# Patient Record
Sex: Female | Born: 1965
Health system: Southern US, Community
[De-identification: ages and names within clinical notes are randomized; demographics above are authoritative.]

## PROBLEM LIST (undated history)

## (undated) DIAGNOSIS — K2970 Gastritis, unspecified, without bleeding: Secondary | ICD-10-CM

## (undated) DIAGNOSIS — I10 Essential (primary) hypertension: Secondary | ICD-10-CM

## (undated) DIAGNOSIS — K439 Ventral hernia without obstruction or gangrene: Secondary | ICD-10-CM

## (undated) DIAGNOSIS — K3184 Gastroparesis: Secondary | ICD-10-CM

## (undated) DIAGNOSIS — G43909 Migraine, unspecified, not intractable, without status migrainosus: Secondary | ICD-10-CM

## (undated) DIAGNOSIS — E119 Type 2 diabetes mellitus without complications: Secondary | ICD-10-CM

## (undated) HISTORY — PX: CHOLECYSTECTOMY: SHX55

## (undated) HISTORY — DX: Type 2 diabetes mellitus without complications: E11.9

## (undated) HISTORY — PX: ABDOMINAL HYSTERECTOMY: SHX81

## (undated) HISTORY — PX: HERNIA REPAIR: SHX51

## (undated) HISTORY — DX: Essential (primary) hypertension: I10

## (undated) HISTORY — PX: APPLICATION OF WOUND VAC: SHX5189

## (undated) HISTORY — PX: APPENDECTOMY: SHX54

## (undated) HISTORY — DX: Ventral hernia without obstruction or gangrene: K43.9

## (undated) HISTORY — DX: Migraine, unspecified, not intractable, without status migrainosus: G43.909

## (undated) HISTORY — PX: KNEE SURGERY: SHX244

---

## 2004-05-05 ENCOUNTER — Emergency Department: Payer: Self-pay | Admitting: Emergency Medicine

## 2004-05-27 ENCOUNTER — Ambulatory Visit: Payer: Self-pay | Admitting: Pain Medicine

## 2004-07-02 ENCOUNTER — Ambulatory Visit: Payer: Self-pay | Admitting: Pain Medicine

## 2004-07-15 ENCOUNTER — Ambulatory Visit: Payer: Self-pay | Admitting: Pain Medicine

## 2004-07-20 ENCOUNTER — Emergency Department (HOSPITAL_COMMUNITY): Admission: EM | Admit: 2004-07-20 | Discharge: 2004-07-20 | Payer: Self-pay | Admitting: Emergency Medicine

## 2004-08-20 ENCOUNTER — Emergency Department: Payer: Self-pay | Admitting: Emergency Medicine

## 2004-08-24 ENCOUNTER — Ambulatory Visit: Payer: Self-pay | Admitting: Pain Medicine

## 2004-11-03 ENCOUNTER — Ambulatory Visit: Payer: Self-pay | Admitting: Pain Medicine

## 2004-11-11 ENCOUNTER — Ambulatory Visit: Payer: Self-pay | Admitting: Pain Medicine

## 2004-12-14 ENCOUNTER — Emergency Department (HOSPITAL_COMMUNITY): Admission: EM | Admit: 2004-12-14 | Discharge: 2004-12-14 | Payer: Self-pay | Admitting: Emergency Medicine

## 2004-12-25 ENCOUNTER — Ambulatory Visit (HOSPITAL_COMMUNITY): Admission: RE | Admit: 2004-12-25 | Discharge: 2004-12-25 | Payer: Self-pay | Admitting: Internal Medicine

## 2004-12-25 ENCOUNTER — Encounter: Payer: Self-pay | Admitting: Cardiovascular Disease

## 2004-12-25 ENCOUNTER — Ambulatory Visit: Payer: Self-pay | Admitting: Cardiovascular Disease

## 2005-01-21 ENCOUNTER — Inpatient Hospital Stay (HOSPITAL_COMMUNITY): Admission: EM | Admit: 2005-01-21 | Discharge: 2005-01-24 | Payer: Self-pay | Admitting: Emergency Medicine

## 2005-01-31 ENCOUNTER — Emergency Department: Payer: Self-pay | Admitting: Internal Medicine

## 2005-02-11 ENCOUNTER — Ambulatory Visit (HOSPITAL_COMMUNITY): Admission: RE | Admit: 2005-02-11 | Discharge: 2005-02-11 | Payer: Self-pay | Admitting: Orthopedic Surgery

## 2005-02-17 ENCOUNTER — Encounter: Admission: RE | Admit: 2005-02-17 | Discharge: 2005-02-17 | Payer: Self-pay | Admitting: Orthopedic Surgery

## 2005-02-25 ENCOUNTER — Emergency Department: Payer: Self-pay | Admitting: Emergency Medicine

## 2005-10-16 ENCOUNTER — Encounter: Admission: RE | Admit: 2005-10-16 | Discharge: 2005-10-16 | Payer: Self-pay | Admitting: Family Medicine

## 2006-02-28 ENCOUNTER — Ambulatory Visit (HOSPITAL_BASED_OUTPATIENT_CLINIC_OR_DEPARTMENT_OTHER): Admission: RE | Admit: 2006-02-28 | Discharge: 2006-02-28 | Payer: Self-pay | Admitting: Orthopedic Surgery

## 2006-07-04 ENCOUNTER — Emergency Department: Payer: Self-pay | Admitting: Emergency Medicine

## 2006-07-05 ENCOUNTER — Other Ambulatory Visit: Payer: Self-pay

## 2006-07-06 ENCOUNTER — Emergency Department: Payer: Self-pay | Admitting: Internal Medicine

## 2006-07-07 ENCOUNTER — Other Ambulatory Visit: Payer: Self-pay

## 2006-07-07 ENCOUNTER — Emergency Department: Payer: Self-pay | Admitting: Emergency Medicine

## 2006-09-22 ENCOUNTER — Emergency Department: Payer: Self-pay | Admitting: General Practice

## 2006-12-29 ENCOUNTER — Encounter: Admission: RE | Admit: 2006-12-29 | Discharge: 2006-12-29 | Payer: Self-pay | Admitting: Family Medicine

## 2007-03-01 ENCOUNTER — Inpatient Hospital Stay: Payer: Self-pay | Admitting: Internal Medicine

## 2007-03-01 ENCOUNTER — Other Ambulatory Visit: Payer: Self-pay

## 2007-04-18 ENCOUNTER — Ambulatory Visit: Payer: Self-pay | Admitting: Family Medicine

## 2008-02-15 ENCOUNTER — Ambulatory Visit: Payer: Self-pay | Admitting: Internal Medicine

## 2008-02-29 ENCOUNTER — Ambulatory Visit: Payer: Self-pay | Admitting: Unknown Physician Specialty

## 2008-04-05 ENCOUNTER — Emergency Department: Payer: Self-pay | Admitting: Emergency Medicine

## 2008-06-03 ENCOUNTER — Emergency Department: Payer: Self-pay | Admitting: Emergency Medicine

## 2008-08-02 ENCOUNTER — Emergency Department: Payer: Self-pay

## 2008-08-24 ENCOUNTER — Emergency Department: Payer: Self-pay | Admitting: Emergency Medicine

## 2008-10-23 ENCOUNTER — Emergency Department: Payer: Self-pay | Admitting: Internal Medicine

## 2008-11-05 ENCOUNTER — Ambulatory Visit: Payer: Self-pay | Admitting: Pain Medicine

## 2008-11-20 ENCOUNTER — Ambulatory Visit: Payer: Self-pay | Admitting: Pain Medicine

## 2008-11-25 ENCOUNTER — Ambulatory Visit: Payer: Self-pay | Admitting: Pain Medicine

## 2008-11-27 ENCOUNTER — Ambulatory Visit: Payer: Self-pay | Admitting: Pain Medicine

## 2008-12-04 ENCOUNTER — Ambulatory Visit: Payer: Self-pay | Admitting: Pain Medicine

## 2008-12-11 ENCOUNTER — Ambulatory Visit: Payer: Self-pay | Admitting: Pain Medicine

## 2008-12-26 ENCOUNTER — Ambulatory Visit: Payer: Self-pay | Admitting: Pain Medicine

## 2008-12-30 ENCOUNTER — Ambulatory Visit: Payer: Self-pay | Admitting: Pain Medicine

## 2009-02-06 ENCOUNTER — Ambulatory Visit: Payer: Self-pay | Admitting: Pain Medicine

## 2009-02-12 ENCOUNTER — Ambulatory Visit: Payer: Self-pay | Admitting: Pain Medicine

## 2009-03-06 ENCOUNTER — Ambulatory Visit: Payer: Self-pay | Admitting: Pain Medicine

## 2009-03-10 ENCOUNTER — Ambulatory Visit: Payer: Self-pay | Admitting: Pain Medicine

## 2009-03-20 ENCOUNTER — Ambulatory Visit: Payer: Self-pay | Admitting: Pain Medicine

## 2009-05-01 ENCOUNTER — Ambulatory Visit: Payer: Self-pay | Admitting: Pain Medicine

## 2009-05-05 ENCOUNTER — Ambulatory Visit: Payer: Self-pay | Admitting: Pain Medicine

## 2009-05-12 ENCOUNTER — Ambulatory Visit: Payer: Self-pay | Admitting: Pain Medicine

## 2009-06-03 ENCOUNTER — Ambulatory Visit: Payer: Self-pay | Admitting: Pain Medicine

## 2009-07-01 ENCOUNTER — Ambulatory Visit: Payer: Self-pay | Admitting: Pain Medicine

## 2009-07-23 ENCOUNTER — Emergency Department: Payer: Self-pay | Admitting: Emergency Medicine

## 2009-07-23 ENCOUNTER — Ambulatory Visit: Payer: Self-pay | Admitting: Pain Medicine

## 2009-08-28 ENCOUNTER — Ambulatory Visit: Payer: Self-pay | Admitting: Pain Medicine

## 2009-09-08 ENCOUNTER — Ambulatory Visit: Payer: Self-pay | Admitting: Pain Medicine

## 2009-09-25 ENCOUNTER — Ambulatory Visit: Payer: Self-pay | Admitting: Pain Medicine

## 2009-10-23 ENCOUNTER — Ambulatory Visit: Payer: Self-pay | Admitting: Pain Medicine

## 2009-10-27 ENCOUNTER — Ambulatory Visit: Payer: Self-pay | Admitting: Pain Medicine

## 2009-11-04 ENCOUNTER — Ambulatory Visit: Payer: Self-pay | Admitting: Pain Medicine

## 2009-11-24 ENCOUNTER — Ambulatory Visit: Payer: Self-pay | Admitting: Pain Medicine

## 2009-12-01 ENCOUNTER — Emergency Department: Payer: Self-pay | Admitting: Emergency Medicine

## 2009-12-16 ENCOUNTER — Ambulatory Visit: Payer: Self-pay | Admitting: Internal Medicine

## 2009-12-25 ENCOUNTER — Ambulatory Visit: Payer: Self-pay | Admitting: Pain Medicine

## 2010-01-21 ENCOUNTER — Ambulatory Visit: Payer: Self-pay | Admitting: Pain Medicine

## 2010-01-28 ENCOUNTER — Ambulatory Visit: Payer: Self-pay | Admitting: Pain Medicine

## 2010-02-17 ENCOUNTER — Ambulatory Visit: Payer: Self-pay | Admitting: Pain Medicine

## 2010-02-19 ENCOUNTER — Inpatient Hospital Stay: Payer: Self-pay | Admitting: Internal Medicine

## 2010-03-11 ENCOUNTER — Ambulatory Visit: Payer: Self-pay | Admitting: Pain Medicine

## 2010-03-21 ENCOUNTER — Ambulatory Visit: Payer: Self-pay | Admitting: Pain Medicine

## 2010-04-06 ENCOUNTER — Ambulatory Visit (HOSPITAL_BASED_OUTPATIENT_CLINIC_OR_DEPARTMENT_OTHER): Admission: RE | Admit: 2010-04-06 | Discharge: 2010-04-06 | Payer: Self-pay | Admitting: Orthopedic Surgery

## 2010-04-21 ENCOUNTER — Ambulatory Visit: Payer: Self-pay | Admitting: Pain Medicine

## 2010-05-08 ENCOUNTER — Emergency Department: Payer: Self-pay | Admitting: Unknown Physician Specialty

## 2010-05-21 ENCOUNTER — Ambulatory Visit: Payer: Self-pay | Admitting: Pain Medicine

## 2010-06-18 ENCOUNTER — Ambulatory Visit: Payer: Self-pay | Admitting: Pain Medicine

## 2010-06-21 ENCOUNTER — Emergency Department: Payer: Self-pay | Admitting: Emergency Medicine

## 2010-07-05 ENCOUNTER — Encounter: Payer: Self-pay | Admitting: Specialist

## 2010-07-20 ENCOUNTER — Ambulatory Visit: Payer: Self-pay | Admitting: Pain Medicine

## 2010-07-29 ENCOUNTER — Ambulatory Visit: Payer: Self-pay | Admitting: Pain Medicine

## 2010-08-18 ENCOUNTER — Ambulatory Visit: Payer: Self-pay | Admitting: Pain Medicine

## 2010-08-26 LAB — GLUCOSE, CAPILLARY: Glucose-Capillary: 91 mg/dL (ref 70–99)

## 2010-08-26 LAB — POCT I-STAT, CHEM 8
Chloride: 99 mEq/L (ref 96–112)
Glucose, Bld: 102 mg/dL — ABNORMAL HIGH (ref 70–99)
HCT: 47 % — ABNORMAL HIGH (ref 36.0–46.0)
Hemoglobin: 16 g/dL — ABNORMAL HIGH (ref 12.0–15.0)
Potassium: 3.4 mEq/L — ABNORMAL LOW (ref 3.5–5.1)

## 2010-09-01 ENCOUNTER — Ambulatory Visit: Payer: Self-pay | Admitting: Pain Medicine

## 2010-09-17 ENCOUNTER — Ambulatory Visit: Payer: Self-pay | Admitting: Pain Medicine

## 2010-09-30 ENCOUNTER — Ambulatory Visit: Payer: Self-pay | Admitting: Pain Medicine

## 2010-10-12 ENCOUNTER — Ambulatory Visit: Payer: Self-pay | Admitting: Internal Medicine

## 2010-10-20 ENCOUNTER — Ambulatory Visit: Payer: Self-pay | Admitting: Pain Medicine

## 2010-10-28 ENCOUNTER — Ambulatory Visit: Payer: Self-pay | Admitting: Pain Medicine
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
## 2010-10-30 NOTE — Op Note (Signed)
Daisy [name], Daisy [name]               ACCOUNT NO.:  [id number]   MEDICAL RECORD NO.:  [id number]          PATIENT TYPE:  AMB   LOCATION:  DAY                          FACILITY:  [facility]   PHYSICIAN:  Marlowe Kays, M.D.  DATE OF BIRTH:  May 20, 1966   DATE OF PROCEDURE:  02/11/2005  DATE OF DISCHARGE:                                 OPERATIVE REPORT   PREOPERATIVE DIAGNOSIS:  Torn lateral meniscus left knee.   POSTOPERATIVE DIAGNOSES:  1.  Torn lateral meniscus.  2.  Grade 2/4 chondromalacia lateral femoral condyle left knee.   OPERATION:  Left knee arthroscopy with partial lateral meniscectomy and  shaving of lateral femoral condyle.   SURGEON:  Dr. Simonne Come   ASSISTANT:  Nurse.   ANESTHESIA:  General.   PATHOLOGY AND JUSTIFICATION OF PROCEDURE:  Because of pain and swelling in  her left knee, I obtained an MRI on November 20, 2004, which showed a badly torn  lateral meniscus with some mild thinning of the lateral compartment.  Otherwise, the knee appeared to be unremarkable, and this is what I found at  surgery.   PROCEDURE:  Satisfactory general anesthesia, pneumatic tourniquet, leg was  esmarched out nonsterilely.  Thigh stabilizer and prepped from thigh  stabilizer to ankle with DuraPrep and draped in a sterile field.  Ace wrap  and knee support for right knee.  Superomedial saline inflow, first through  an anterior medial portal lateral compartment, knee joint was evaluated.  The significant tear of the lateral meniscus was noted and removed with a  combination of instruments including arthroscopic scissors, baskets, and the  3.5 shaver until the remaining meniscus was stable on probing.  She also had  grade 2/4 chondromalacia of the lateral femoral condyle which I shaved down  until smooth.  Looking up in the lateral gutter and suprapatellar area, I  was unable to see the patella through this portal.  She is morbidly obese.  I then reversed portals.  Medially, I removed some  synovium with the 3.5  shaver.  The medial meniscus appeared to be normal with no wear in the  joint.  I looked up in the medial gutter and suprapatellar area.  Patella  surface was smooth and did not require any surgical treatment.  The knee  joint was then irrigated until clear and all fluid possible removed.  The  two anterior portals were closed with 4-0 nylon; 20 mL 0.5% Marcaine with  adrenaline, 4 mg of morphine were then instilled through the inflow  apparatus which was removed and this portal closed with 4-0 nylon as well.  Betadine, Adaptic, dry sterile dressing were applied.  Tourniquet was  released.  She tolerated the procedure well and was taken to the recovery  room in satisfactory condition with no known complications.          ______________________________  Marlowe Kays, M.D.    JA/MEDQ  D:  02/11/2005  T:  02/11/2005  Job:  [id number]

## 2010-10-30 NOTE — Discharge Summary (Signed)
NAMEREGGIE, WELGE               ACCOUNT NO.:  [id number]   MEDICAL RECORD NO.:  [id number]          PATIENT TYPE:  INP   LOCATION:  5006                         FACILITY:  [facility]   PHYSICIAN:  Fleet Contras, M.D.    DATE OF BIRTH:  1966/05/11   DATE OF ADMISSION:  01/21/2005  DATE OF DISCHARGE:  01/24/2005                                 DISCHARGE SUMMARY   HISTORY OF PRESENT ILLNESS:  Mrs. Eanes is a 45 year old African American  lady with past medical history significant for peptic ulcer disease,  hypertension, depression, and arthritis of the knees.  The patient presented  to the emergency room with complaints of one day of nausea, vomiting, and  epigastric pain.  She denied any use of alcohol or nonsteroidal anti-  inflammatory drugs.  There was no blood in her vomitus.  Could not be sure  as she was in mild painful distress.  Vital signs showed blood pressure  195/100, temperature 99.1. Abdomen was soft.  She was tender in the  epigastrium without guarding or rebound.  Bowel sounds were present.  Urinalysis was negative.  White count was 9.9, hemoglobin 15.2, platelet  count 4.7.  Sodium 138, potassium 3, BUN 4, creatinine 0.8, glucose 119.  Lipase was 26.  She was, therefore, admitted to a medical bed with diagnosis  of acute gastritis, uncontrolled hypertension, and hypopotassemia.   HOSPITAL COURSE:  The patient was started on intravenous fluids, kept n.p.o.  then advanced to clear liquids.  She was on IV analgesia and IV Protonix.  Potassium was repleted both intravenously and orally.  Her symptoms improved  on this regimen.  Her blood pressure came down without any need of  medications.  On January 24, 2005, she seemed much better, was able to  tolerate a full diet.  Vital signs were stable with a blood pressure 100/60.  Abdomen was benign.  Bowel sounds were present.  Her potassium had improved  to normal.   DISCHARGE DIAGNOSES:  1.  Gastritis, resolved.  2.   Hypertension, well controlled.  3.  Hypokalemia, resolved.   CONDITION ON DISCHARGE:  Stable.   DISPOSITION:  Home.   DISCHARGE MEDICATIONS:  1.  Nexium 40 mg once a day.  2.  Reglan 10 mg one p.o. q.6h. p.r.n.  3.  Xanax 0.25 mg t.i.d. p.r.n.  4.  Advicor 500/20 one p.o. daily.  5.  Allegra 180 mg daily.  6.  Lotensin 10 mg daily.  7.  Prozac 20 mg b.i.d.  8.  Elavil 150 mg q.h.s.  9.  Topamax 200 mg at night.  10. She was advised to stop using hydrochlorothiazide and to use Lasix      p.r.n. for leg swelling.   FOLLOWUP:  Follow up with Dr. Concepcion Elk in one week.      Fleet Contras, M.D.  Electronically Signed     EA/MEDQ  D:  04/15/2005  T:  04/15/2005  Job:  [id number]

## 2010-11-02 ENCOUNTER — Emergency Department: Payer: Self-pay | Admitting: Emergency Medicine

## 2010-11-19 ENCOUNTER — Ambulatory Visit: Payer: Self-pay | Admitting: Pain Medicine

## 2010-12-22 ENCOUNTER — Ambulatory Visit: Payer: Self-pay | Admitting: Pain Medicine

## 2011-01-17 ENCOUNTER — Emergency Department: Payer: Self-pay | Admitting: Emergency Medicine

## 2011-01-19 ENCOUNTER — Ambulatory Visit: Payer: Self-pay | Admitting: Pain Medicine

## 2011-02-18 ENCOUNTER — Ambulatory Visit: Payer: Self-pay | Admitting: Pain Medicine

## 2011-03-01 ENCOUNTER — Ambulatory Visit: Payer: Self-pay | Admitting: Pain Medicine

## 2011-03-18 ENCOUNTER — Ambulatory Visit: Payer: Self-pay | Admitting: Pain Medicine

## 2011-04-15 ENCOUNTER — Ambulatory Visit: Payer: Self-pay | Admitting: Pain Medicine

## 2011-04-28 ENCOUNTER — Ambulatory Visit: Payer: Self-pay | Admitting: Pain Medicine

## 2011-05-13 ENCOUNTER — Ambulatory Visit: Payer: Self-pay | Admitting: Pain Medicine

## 2011-05-31 ENCOUNTER — Ambulatory Visit: Payer: Self-pay | Admitting: Pain Medicine

## 2011-07-08 ENCOUNTER — Ambulatory Visit: Payer: Self-pay | Admitting: Pain Medicine

## 2011-08-10 ENCOUNTER — Ambulatory Visit: Payer: Self-pay | Admitting: Pain Medicine

## 2011-09-09 ENCOUNTER — Ambulatory Visit: Payer: Self-pay | Admitting: Pain Medicine

## 2011-09-24 ENCOUNTER — Emergency Department: Payer: Self-pay | Admitting: *Deleted

## 2011-09-24 LAB — CBC
HGB: 13.2 g/dL (ref 12.0–16.0)
MCH: 29.8 pg (ref 26.0–34.0)
RBC: 4.41 10*6/uL (ref 3.80–5.20)
WBC: 6.3 10*3/uL (ref 3.6–11.0)

## 2011-09-24 LAB — URINALYSIS, COMPLETE
Bacteria: NONE SEEN
Blood: NEGATIVE
Glucose,UR: NEGATIVE mg/dL (ref 0–75)
Ketone: NEGATIVE
Leukocyte Esterase: NEGATIVE
Protein: NEGATIVE
Specific Gravity: 1.019 (ref 1.003–1.030)
Squamous Epithelial: NONE SEEN

## 2011-09-24 LAB — COMPREHENSIVE METABOLIC PANEL
Albumin: 3.5 g/dL (ref 3.4–5.0)
Anion Gap: 9 (ref 7–16)
Bilirubin,Total: 0.5 mg/dL (ref 0.2–1.0)
Calcium, Total: 7.9 mg/dL — ABNORMAL LOW (ref 8.5–10.1)
Chloride: 104 mmol/L (ref 98–107)
Creatinine: 0.83 mg/dL (ref 0.60–1.30)
EGFR (African American): >60
Glucose: 93 mg/dL (ref 65–99)
Potassium: 3.7 mmol/L (ref 3.5–5.1)

## 2011-09-24 LAB — CK TOTAL AND CKMB (NOT AT ARMC)
CK, Total: 87 U/L (ref 21–215)
CK-MB: <0.5 ng/mL — ABNORMAL LOW (ref 0.5–3.6)

## 2011-09-24 LAB — TROPONIN I: Troponin-I: <0.02 ng/mL

## 2011-09-24 LAB — LIPASE, BLOOD: Lipase: 72 U/L — ABNORMAL LOW (ref 73–393)

## 2011-09-28 LAB — COMPREHENSIVE METABOLIC PANEL
Anion Gap: 11 (ref 7–16)
BUN: 6 mg/dL — ABNORMAL LOW (ref 7–18)
Bilirubin,Total: 0.2 mg/dL (ref 0.2–1.0)
Co2: 22 mmol/L (ref 21–32)
Creatinine: 0.85 mg/dL (ref 0.60–1.30)
EGFR (Non-African Amer.): >60
Glucose: 102 mg/dL — ABNORMAL HIGH (ref 65–99)
Osmolality: 273 (ref 275–301)
Potassium: 4.2 mmol/L (ref 3.5–5.1)
SGPT (ALT): 58 U/L
Sodium: 138 mmol/L (ref 136–145)

## 2011-09-28 LAB — LIPASE, BLOOD: Lipase: 79 U/L (ref 73–393)

## 2011-09-28 LAB — CBC
HGB: 13.7 g/dL (ref 12.0–16.0)
MCV: 93 fL (ref 80–100)
RBC: 4.49 10*6/uL (ref 3.80–5.20)
RDW: 14.1 % (ref 11.5–14.5)
WBC: 7 10*3/uL (ref 3.6–11.0)

## 2011-09-28 LAB — URINALYSIS, COMPLETE
Blood: NEGATIVE
Glucose,UR: NEGATIVE mg/dL (ref 0–75)
Nitrite: NEGATIVE
Ph: 9 (ref 4.5–8.0)
Protein: NEGATIVE
RBC,UR: NONE SEEN /HPF (ref 0–5)
Specific Gravity: 1.014 (ref 1.003–1.030)
Squamous Epithelial: NONE SEEN

## 2011-09-29 ENCOUNTER — Observation Stay: Payer: Self-pay | Admitting: Surgery

## 2011-09-29 LAB — CBC WITH DIFFERENTIAL/PLATELET
HCT: 38.9 % (ref 35.0–47.0)
HGB: 12.6 g/dL (ref 12.0–16.0)
Lymphocyte #: 3.7 10*3/uL — ABNORMAL HIGH (ref 1.0–3.6)
MCH: 30.1 pg (ref 26.0–34.0)
MCHC: 32.5 g/dL (ref 32.0–36.0)
MCV: 93 fL (ref 80–100)
Neutrophil %: 43.7 %
Platelet: 301 10*3/uL (ref 150–440)
RBC: 4.2 10*6/uL (ref 3.80–5.20)
RDW: 13.8 % (ref 11.5–14.5)
WBC: 7.8 10*3/uL (ref 3.6–11.0)

## 2011-09-29 LAB — BASIC METABOLIC PANEL
Anion Gap: 9 (ref 7–16)
Chloride: 105 mmol/L (ref 98–107)
EGFR (African American): >60
EGFR (Non-African Amer.): >60
Glucose: 91 mg/dL (ref 65–99)
Osmolality: 278 (ref 275–301)
Sodium: 141 mmol/L (ref 136–145)

## 2011-09-29 LAB — LIPID PANEL: Ldl Cholesterol, Calc: 162 mg/dL — ABNORMAL HIGH (ref 0–100)

## 2011-09-29 LAB — HEMOGLOBIN A1C: Hemoglobin A1C: 6.2 % (ref 4.2–6.3)

## 2011-09-30 LAB — CBC WITH DIFFERENTIAL/PLATELET
HCT: 37.6 % (ref 35.0–47.0)
HGB: 12.2 g/dL (ref 12.0–16.0)
Lymphocyte #: 4 10*3/uL — ABNORMAL HIGH (ref 1.0–3.6)
Lymphocyte %: 54.6 %
MCV: 92 fL (ref 80–100)
Monocyte #: 0.5 x10 3/mm (ref 0.2–0.9)
Neutrophil #: 2.6 10*3/uL (ref 1.4–6.5)
Neutrophil %: 36.4 %
RBC: 4.09 10*6/uL (ref 3.80–5.20)
RDW: 13.7 % (ref 11.5–14.5)

## 2011-10-12 ENCOUNTER — Ambulatory Visit: Payer: Self-pay | Admitting: Pain Medicine

## 2011-11-09 ENCOUNTER — Ambulatory Visit: Payer: Self-pay | Admitting: Pain Medicine

## 2011-11-15 ENCOUNTER — Ambulatory Visit: Payer: Self-pay | Admitting: Pain Medicine

## 2011-12-09 ENCOUNTER — Ambulatory Visit: Payer: Self-pay | Admitting: Pain Medicine

## 2012-01-06 ENCOUNTER — Ambulatory Visit: Payer: Self-pay | Admitting: Pain Medicine

## 2012-02-08 ENCOUNTER — Ambulatory Visit: Payer: Self-pay | Admitting: Pain Medicine

## 2012-02-21 ENCOUNTER — Ambulatory Visit: Payer: Self-pay | Admitting: Pain Medicine

## 2012-03-07 ENCOUNTER — Ambulatory Visit: Payer: Self-pay | Admitting: Pain Medicine

## 2012-04-06 ENCOUNTER — Ambulatory Visit: Payer: Self-pay | Admitting: Pain Medicine

## 2012-04-17 ENCOUNTER — Ambulatory Visit: Payer: Self-pay | Admitting: Pain Medicine

## 2012-04-20 ENCOUNTER — Emergency Department: Payer: Self-pay | Admitting: Emergency Medicine

## 2012-04-20 LAB — CBC
HCT: 44.5 % (ref 35.0–47.0)
MCH: 29.5 pg (ref 26.0–34.0)
MCV: 91 fL (ref 80–100)
Platelet: 383 10*3/uL (ref 150–440)
RBC: 4.92 10*6/uL (ref 3.80–5.20)
WBC: 11.3 10*3/uL — ABNORMAL HIGH (ref 3.6–11.0)

## 2012-04-20 LAB — COMPREHENSIVE METABOLIC PANEL
Albumin: 4.1 g/dL (ref 3.4–5.0)
Alkaline Phosphatase: 70 U/L (ref 50–136)
BUN: 11 mg/dL (ref 7–18)
EGFR (Non-African Amer.): >60
Glucose: 102 mg/dL — ABNORMAL HIGH (ref 65–99)
Osmolality: 273 (ref 275–301)
Potassium: 3.3 mmol/L — ABNORMAL LOW (ref 3.5–5.1)
SGOT(AST): 16 U/L (ref 15–37)
Sodium: 137 mmol/L (ref 136–145)

## 2012-05-09 ENCOUNTER — Ambulatory Visit: Payer: Self-pay | Admitting: Pain Medicine

## 2012-05-18 ENCOUNTER — Emergency Department: Payer: Self-pay | Admitting: Emergency Medicine

## 2012-05-18 LAB — CBC WITH DIFFERENTIAL/PLATELET
Eosinophil %: 0.2 %
Lymphocyte %: 15.6 %
Monocyte %: 2.6 %
Neutrophil %: 80.8 %
Platelet: 318 10*3/uL (ref 150–440)
RBC: 4.49 10*6/uL (ref 3.80–5.20)
WBC: 8.9 10*3/uL (ref 3.6–11.0)

## 2012-05-18 LAB — COMPREHENSIVE METABOLIC PANEL
Alkaline Phosphatase: 77 U/L (ref 50–136)
Anion Gap: 9 (ref 7–16)
Calcium, Total: 8.9 mg/dL (ref 8.5–10.1)
Co2: 19 mmol/L — ABNORMAL LOW (ref 21–32)
EGFR (African American): >60
Glucose: 123 mg/dL — ABNORMAL HIGH (ref 65–99)
Osmolality: 278 (ref 275–301)
SGOT(AST): 16 U/L (ref 15–37)
Sodium: 140 mmol/L (ref 136–145)

## 2012-05-18 LAB — URINALYSIS, COMPLETE
Bacteria: NONE SEEN
Bilirubin,UR: NEGATIVE
Glucose,UR: NEGATIVE mg/dL (ref 0–75)
Leukocyte Esterase: NEGATIVE
Ph: 8 (ref 4.5–8.0)
RBC,UR: 2 /HPF (ref 0–5)
Squamous Epithelial: 5

## 2012-05-18 LAB — LIPASE, BLOOD: Lipase: 59 U/L — ABNORMAL LOW (ref 73–393)

## 2012-05-19 ENCOUNTER — Ambulatory Visit: Payer: Self-pay | Admitting: Pain Medicine

## 2012-05-19 ENCOUNTER — Emergency Department: Payer: Self-pay | Admitting: Emergency Medicine

## 2012-06-05 ENCOUNTER — Ambulatory Visit: Payer: Self-pay | Admitting: Pain Medicine

## 2012-06-13 ENCOUNTER — Inpatient Hospital Stay: Payer: Self-pay

## 2012-06-13 LAB — SEDIMENTATION RATE: Erythrocyte Sed Rate: 19 mm/hr (ref 0–20)

## 2012-06-13 LAB — URINALYSIS, COMPLETE
Glucose,UR: NEGATIVE mg/dL (ref 0–75)
Protein: NEGATIVE
RBC,UR: 2 /HPF (ref 0–5)
Squamous Epithelial: 2
WBC UR: 1 /HPF (ref 0–5)

## 2012-06-13 LAB — COMPREHENSIVE METABOLIC PANEL
Albumin: 4.1 g/dL (ref 3.4–5.0)
Alkaline Phosphatase: 76 U/L (ref 50–136)
BUN: 7 mg/dL (ref 7–18)
Chloride: 104 mmol/L (ref 98–107)
EGFR (African American): >60
Glucose: 119 mg/dL — ABNORMAL HIGH (ref 65–99)
SGOT(AST): 15 U/L (ref 15–37)
SGPT (ALT): 19 U/L (ref 12–78)
Total Protein: 8.9 g/dL — ABNORMAL HIGH (ref 6.4–8.2)

## 2012-06-13 LAB — CBC
HGB: 14.1 g/dL (ref 12.0–16.0)
MCH: 30.4 pg (ref 26.0–34.0)
MCHC: 33.2 g/dL (ref 32.0–36.0)
MCV: 92 fL (ref 80–100)

## 2012-06-13 LAB — PRO B NATRIURETIC PEPTIDE: B-Type Natriuretic Peptide: 4802 pg/mL — ABNORMAL HIGH (ref 0–125)

## 2012-06-13 LAB — LIPASE, BLOOD: Lipase: 74 U/L (ref 73–393)

## 2012-06-14 LAB — BASIC METABOLIC PANEL
BUN: 5 mg/dL — ABNORMAL LOW (ref 7–18)
Calcium, Total: 8.5 mg/dL (ref 8.5–10.1)
Chloride: 102 mmol/L (ref 98–107)
Creatinine: 0.88 mg/dL (ref 0.60–1.30)
EGFR (Non-African Amer.): >60
Osmolality: 277 (ref 275–301)
Sodium: 140 mmol/L (ref 136–145)

## 2012-06-14 LAB — CBC WITH DIFFERENTIAL/PLATELET
Basophil #: 0.1 10*3/uL (ref 0.0–0.1)
Basophil %: 0.6 %
Eosinophil %: 0 %
HGB: 12.1 g/dL (ref 12.0–16.0)
Lymphocyte #: 2.7 10*3/uL (ref 1.0–3.6)
MCH: 29.6 pg (ref 26.0–34.0)
MCHC: 32.9 g/dL (ref 32.0–36.0)
Monocyte #: 0.8 x10 3/mm (ref 0.2–0.9)
Neutrophil %: 70.1 %
Platelet: 294 10*3/uL (ref 150–440)
RBC: 4.08 10*6/uL (ref 3.80–5.20)
RDW: 14 % (ref 11.5–14.5)
WBC: 11.7 10*3/uL — ABNORMAL HIGH (ref 3.6–11.0)

## 2012-06-14 LAB — TROPONIN I
Troponin-I: 0.33 ng/mL — ABNORMAL HIGH
Troponin-I: 0.38 ng/mL — ABNORMAL HIGH
Troponin-I: 0.6 ng/mL — ABNORMAL HIGH
Troponin-I: <0.02 ng/mL

## 2012-06-14 LAB — CK TOTAL AND CKMB (NOT AT ARMC)
CK, Total: 100 U/L (ref 21–215)
CK-MB: <0.5 ng/mL — ABNORMAL LOW (ref 0.5–3.6)
CK-MB: 1.6 ng/mL (ref 0.5–3.6)

## 2012-06-15 LAB — CBC WITH DIFFERENTIAL/PLATELET
Basophil #: 0 10*3/uL (ref 0.0–0.1)
Basophil %: 0.3 %
Eosinophil #: 0.1 10*3/uL (ref 0.0–0.7)
Eosinophil %: 0.9 %
HCT: 39.7 % (ref 35.0–47.0)
Lymphocyte #: 3.3 10*3/uL (ref 1.0–3.6)
Lymphocyte %: 41 %
MCH: 29.7 pg (ref 26.0–34.0)
MCHC: 32.7 g/dL (ref 32.0–36.0)
Monocyte %: 8.2 %
Neutrophil #: 4 10*3/uL (ref 1.4–6.5)
RBC: 4.37 10*6/uL (ref 3.80–5.20)
RDW: 14.4 % (ref 11.5–14.5)
WBC: 8.1 10*3/uL (ref 3.6–11.0)

## 2012-06-15 LAB — BASIC METABOLIC PANEL
Anion Gap: 10 (ref 7–16)
Calcium, Total: 8.7 mg/dL (ref 8.5–10.1)
Chloride: 98 mmol/L (ref 98–107)
Creatinine: 1.07 mg/dL (ref 0.60–1.30)
Glucose: 88 mg/dL (ref 65–99)
Osmolality: 274 (ref 275–301)

## 2012-06-16 LAB — BASIC METABOLIC PANEL
Anion Gap: 7 (ref 7–16)
BUN: 14 mg/dL (ref 7–18)
Chloride: 99 mmol/L (ref 98–107)
Co2: 29 mmol/L (ref 21–32)
Creatinine: 1.29 mg/dL (ref 0.60–1.30)
Glucose: 128 mg/dL — ABNORMAL HIGH (ref 65–99)
Osmolality: 272 (ref 275–301)
Potassium: 3.4 mmol/L — ABNORMAL LOW (ref 3.5–5.1)

## 2012-06-17 LAB — BASIC METABOLIC PANEL
Anion Gap: 7 (ref 7–16)
BUN: 13 mg/dL (ref 7–18)
Calcium, Total: 8.3 mg/dL — ABNORMAL LOW (ref 8.5–10.1)
Co2: 28 mmol/L (ref 21–32)
Creatinine: 0.94 mg/dL (ref 0.60–1.30)
EGFR (African American): >60
EGFR (Non-African Amer.): >60
Glucose: 105 mg/dL — ABNORMAL HIGH (ref 65–99)
Potassium: 4.1 mmol/L (ref 3.5–5.1)
Sodium: 140 mmol/L (ref 136–145)

## 2012-07-04 ENCOUNTER — Ambulatory Visit: Payer: Self-pay | Admitting: Pain Medicine

## 2012-07-13 ENCOUNTER — Emergency Department: Payer: Self-pay | Admitting: Emergency Medicine

## 2012-07-13 LAB — COMPREHENSIVE METABOLIC PANEL
Albumin: 4.3 g/dL (ref 3.4–5.0)
Alkaline Phosphatase: 69 U/L (ref 50–136)
Anion Gap: 10 (ref 7–16)
BUN: 7 mg/dL (ref 7–18)
Calcium, Total: 9.3 mg/dL (ref 8.5–10.1)
Chloride: 109 mmol/L — ABNORMAL HIGH (ref 98–107)
Co2: 21 mmol/L (ref 21–32)
Creatinine: 0.87 mg/dL (ref 0.60–1.30)
EGFR (Non-African Amer.): >60
Glucose: 141 mg/dL — ABNORMAL HIGH (ref 65–99)
SGOT(AST): 16 U/L (ref 15–37)
SGPT (ALT): 19 U/L (ref 12–78)
Total Protein: 9.1 g/dL — ABNORMAL HIGH (ref 6.4–8.2)

## 2012-07-13 LAB — CBC
HGB: 14.9 g/dL (ref 12.0–16.0)
MCV: 91 fL (ref 80–100)
RBC: 4.99 10*6/uL (ref 3.80–5.20)
RDW: 14.6 % — ABNORMAL HIGH (ref 11.5–14.5)
WBC: 9.7 10*3/uL (ref 3.6–11.0)

## 2012-07-13 LAB — TROPONIN I: Troponin-I: <0.02 ng/mL

## 2012-07-15 LAB — URINALYSIS, COMPLETE
Bacteria: NONE SEEN
Blood: NEGATIVE
Glucose,UR: NEGATIVE mg/dL (ref 0–75)
Ketone: NEGATIVE
Ph: 8 (ref 4.5–8.0)
Protein: NEGATIVE
Specific Gravity: 1.008 (ref 1.003–1.030)
Squamous Epithelial: 2
WBC UR: NONE SEEN /HPF (ref 0–5)

## 2012-07-15 LAB — CBC
HCT: 44.2 % (ref 35.0–47.0)
HGB: 14.5 g/dL (ref 12.0–16.0)
MCH: 29.8 pg (ref 26.0–34.0)
MCHC: 32.7 g/dL (ref 32.0–36.0)
MCV: 91 fL (ref 80–100)
Platelet: 333 10*3/uL (ref 150–440)

## 2012-07-15 LAB — COMPREHENSIVE METABOLIC PANEL
Albumin: 4.3 g/dL (ref 3.4–5.0)
Alkaline Phosphatase: 64 U/L (ref 50–136)
Anion Gap: 9 (ref 7–16)
Calcium, Total: 9 mg/dL (ref 8.5–10.1)
Chloride: 106 mmol/L (ref 98–107)
EGFR (African American): >60
EGFR (Non-African Amer.): >60
Osmolality: 272 (ref 275–301)
Total Protein: 8.8 g/dL — ABNORMAL HIGH (ref 6.4–8.2)

## 2012-07-15 LAB — TROPONIN I: Troponin-I: <0.02 ng/mL

## 2012-07-15 LAB — LIPASE, BLOOD: Lipase: 133 U/L (ref 73–393)

## 2012-07-16 ENCOUNTER — Inpatient Hospital Stay: Payer: Self-pay

## 2012-07-16 LAB — CBC WITH DIFFERENTIAL/PLATELET
Basophil %: 1.1 %
Eosinophil %: 1 %
HCT: 40 % (ref 35.0–47.0)
Lymphocyte #: 4.9 10*3/uL — ABNORMAL HIGH (ref 1.0–3.6)
Lymphocyte %: 53.6 %
MCH: 29.9 pg (ref 26.0–34.0)
MCV: 91 fL (ref 80–100)
Monocyte #: 0.6 x10 3/mm (ref 0.2–0.9)
Monocyte %: 6.4 %
Platelet: 297 10*3/uL (ref 150–440)
RDW: 14.8 % — ABNORMAL HIGH (ref 11.5–14.5)

## 2012-07-16 LAB — BASIC METABOLIC PANEL
Anion Gap: 8 (ref 7–16)
BUN: 8 mg/dL (ref 7–18)
Chloride: 110 mmol/L — ABNORMAL HIGH (ref 98–107)
EGFR (African American): >60
Glucose: 83 mg/dL (ref 65–99)
Osmolality: 277 (ref 275–301)
Potassium: 3.5 mmol/L (ref 3.5–5.1)
Sodium: 140 mmol/L (ref 136–145)

## 2012-07-16 LAB — MAGNESIUM: Magnesium: 1.7 mg/dL — ABNORMAL LOW

## 2012-07-17 LAB — CLOSTRIDIUM DIFFICILE BY PCR

## 2012-08-01 ENCOUNTER — Ambulatory Visit: Payer: Self-pay | Admitting: Pain Medicine

## 2012-08-09 ENCOUNTER — Emergency Department: Payer: Self-pay | Admitting: Emergency Medicine

## 2012-08-09 LAB — COMPREHENSIVE METABOLIC PANEL
Albumin: 4.2 g/dL (ref 3.4–5.0)
Alkaline Phosphatase: 65 U/L (ref 50–136)
Anion Gap: 9 (ref 7–16)
Bilirubin,Total: 0.4 mg/dL (ref 0.2–1.0)
Calcium, Total: 9.2 mg/dL (ref 8.5–10.1)
Chloride: 103 mmol/L (ref 98–107)
Creatinine: 0.89 mg/dL (ref 0.60–1.30)
EGFR (Non-African Amer.): >60
Osmolality: 276 (ref 275–301)
Potassium: 3.1 mmol/L — ABNORMAL LOW (ref 3.5–5.1)
Sodium: 138 mmol/L (ref 136–145)
Total Protein: 9.2 g/dL — ABNORMAL HIGH (ref 6.4–8.2)

## 2012-08-09 LAB — URINALYSIS, COMPLETE
Bacteria: NONE SEEN
Bilirubin,UR: NEGATIVE
Glucose,UR: 50 mg/dL (ref 0–75)
Leukocyte Esterase: NEGATIVE
Nitrite: NEGATIVE
RBC,UR: 4 /HPF (ref 0–5)
Specific Gravity: 1.011 (ref 1.003–1.030)
Squamous Epithelial: 7
WBC UR: 1 /HPF (ref 0–5)

## 2012-08-09 LAB — CBC
MCH: 30.1 pg (ref 26.0–34.0)
MCHC: 33.3 g/dL (ref 32.0–36.0)
MCV: 90 fL (ref 80–100)
RBC: 4.89 10*6/uL (ref 3.80–5.20)
RDW: 14.8 % — ABNORMAL HIGH (ref 11.5–14.5)
WBC: 11 10*3/uL (ref 3.6–11.0)

## 2012-08-09 LAB — LIPASE, BLOOD: Lipase: 66 U/L — ABNORMAL LOW (ref 73–393)

## 2012-08-24 ENCOUNTER — Emergency Department: Payer: Self-pay | Admitting: Emergency Medicine

## 2012-08-31 ENCOUNTER — Ambulatory Visit: Payer: Self-pay | Admitting: Pain Medicine

## 2012-09-04 ENCOUNTER — Ambulatory Visit: Payer: Self-pay | Admitting: Pain Medicine

## 2012-09-17 ENCOUNTER — Emergency Department: Payer: Self-pay | Admitting: Emergency Medicine

## 2012-09-28 ENCOUNTER — Ambulatory Visit: Payer: Self-pay | Admitting: Pain Medicine

## 2012-10-15 ENCOUNTER — Emergency Department: Payer: Self-pay | Admitting: Emergency Medicine

## 2012-10-16 ENCOUNTER — Ambulatory Visit: Payer: Self-pay | Admitting: Pain Medicine

## 2012-10-25 ENCOUNTER — Ambulatory Visit: Payer: Self-pay | Admitting: Pain Medicine

## 2012-11-02 ENCOUNTER — Emergency Department: Payer: Self-pay | Admitting: Emergency Medicine

## 2012-11-02 LAB — CBC WITH DIFFERENTIAL/PLATELET
Basophil #: 0 10*3/uL (ref 0.0–0.1)
Eosinophil #: 0 10*3/uL (ref 0.0–0.7)
Eosinophil %: 0.2 %
HCT: 40.8 % (ref 35.0–47.0)
HGB: 13.8 g/dL (ref 12.0–16.0)
Lymphocyte #: 1.5 10*3/uL (ref 1.0–3.6)
MCH: 30 pg (ref 26.0–34.0)
Monocyte #: 0.4 x10 3/mm (ref 0.2–0.9)
Monocyte %: 7.1 %
Neutrophil #: 3.2 10*3/uL (ref 1.4–6.5)
Neutrophil %: 63.2 %
Platelet: 267 10*3/uL (ref 150–440)
RBC: 4.59 10*6/uL (ref 3.80–5.20)
RDW: 14.9 % — ABNORMAL HIGH (ref 11.5–14.5)
WBC: 5.1 10*3/uL (ref 3.6–11.0)

## 2012-11-02 LAB — URINALYSIS, COMPLETE
Bacteria: NONE SEEN
Bilirubin,UR: NEGATIVE
Glucose,UR: NEGATIVE mg/dL (ref 0–75)
Nitrite: NEGATIVE
Ph: 8 (ref 4.5–8.0)
Protein: NEGATIVE
Specific Gravity: 1.006 (ref 1.003–1.030)
Squamous Epithelial: 4
WBC UR: <1 /HPF (ref 0–5)

## 2012-11-02 LAB — COMPREHENSIVE METABOLIC PANEL
Albumin: 3.4 g/dL (ref 3.4–5.0)
Alkaline Phosphatase: 77 U/L (ref 50–136)
Anion Gap: 7 (ref 7–16)
Calcium, Total: 8.7 mg/dL (ref 8.5–10.1)
Chloride: 102 mmol/L (ref 98–107)
Co2: 26 mmol/L (ref 21–32)
Creatinine: 0.83 mg/dL (ref 0.60–1.30)
EGFR (African American): >60
Glucose: 104 mg/dL — ABNORMAL HIGH (ref 65–99)
Potassium: 3.9 mmol/L (ref 3.5–5.1)
SGOT(AST): 32 U/L (ref 15–37)
Total Protein: 7.5 g/dL (ref 6.4–8.2)

## 2012-11-02 LAB — PREGNANCY, URINE: Pregnancy Test, Urine: NEGATIVE m[IU]/mL

## 2012-11-02 LAB — LIPASE, BLOOD: Lipase: 46 U/L — ABNORMAL LOW (ref 73–393)

## 2012-11-07 LAB — CULTURE, BLOOD (SINGLE)

## 2012-11-14 ENCOUNTER — Emergency Department: Payer: Self-pay | Admitting: Emergency Medicine

## 2012-11-15 LAB — URINALYSIS, COMPLETE
Bacteria: NONE SEEN
Bilirubin,UR: NEGATIVE
Ketone: NEGATIVE
Leukocyte Esterase: NEGATIVE
Nitrite: NEGATIVE
Ph: 7 (ref 4.5–8.0)
RBC,UR: NONE SEEN /HPF (ref 0–5)
Specific Gravity: 1.011 (ref 1.003–1.030)
Squamous Epithelial: 7
WBC UR: 1 /HPF (ref 0–5)

## 2012-11-28 ENCOUNTER — Ambulatory Visit: Payer: Self-pay | Admitting: Pain Medicine

## 2012-12-25 ENCOUNTER — Emergency Department: Payer: Self-pay | Admitting: Emergency Medicine

## 2012-12-25 LAB — COMPREHENSIVE METABOLIC PANEL
Albumin: 3 g/dL — ABNORMAL LOW (ref 3.4–5.0)
Alkaline Phosphatase: 69 U/L (ref 50–136)
BUN: 9 mg/dL (ref 7–18)
Bilirubin,Total: 0.2 mg/dL (ref 0.2–1.0)
Calcium, Total: 8.2 mg/dL — ABNORMAL LOW (ref 8.5–10.1)
Chloride: 109 mmol/L — ABNORMAL HIGH (ref 98–107)
Creatinine: 0.75 mg/dL (ref 0.60–1.30)
EGFR (Non-African Amer.): >60
Glucose: 106 mg/dL — ABNORMAL HIGH (ref 65–99)
Osmolality: 280 (ref 275–301)
SGPT (ALT): 12 U/L (ref 12–78)

## 2012-12-25 LAB — CBC
HCT: 35.1 % (ref 35.0–47.0)
HGB: 11.7 g/dL — ABNORMAL LOW (ref 12.0–16.0)
MCH: 30.3 pg (ref 26.0–34.0)
RDW: 13.9 % (ref 11.5–14.5)
WBC: 9.2 10*3/uL (ref 3.6–11.0)

## 2012-12-25 LAB — LIPASE, BLOOD: Lipase: 77 U/L (ref 73–393)

## 2012-12-25 LAB — TROPONIN I: Troponin-I: <0.02 ng/mL

## 2012-12-26 LAB — URINALYSIS, COMPLETE
Glucose,UR: NEGATIVE mg/dL (ref 0–75)
Ketone: NEGATIVE
Leukocyte Esterase: NEGATIVE
Nitrite: NEGATIVE
Ph: 6 (ref 4.5–8.0)
RBC,UR: 2 /HPF (ref 0–5)
Squamous Epithelial: 17
WBC UR: 5 /HPF (ref 0–5)

## 2012-12-27 ENCOUNTER — Ambulatory Visit: Payer: Self-pay | Admitting: Gastroenterology

## 2012-12-28 ENCOUNTER — Ambulatory Visit: Payer: Self-pay | Admitting: Pain Medicine

## 2013-01-11 ENCOUNTER — Emergency Department: Payer: Self-pay | Admitting: Emergency Medicine

## 2013-01-17 ENCOUNTER — Ambulatory Visit: Payer: Self-pay | Admitting: Pain Medicine

## 2013-01-22 ENCOUNTER — Emergency Department: Payer: Self-pay | Admitting: Emergency Medicine

## 2013-02-01 ENCOUNTER — Ambulatory Visit: Payer: Self-pay | Admitting: Pain Medicine

## 2013-02-14 ENCOUNTER — Ambulatory Visit: Payer: Self-pay | Admitting: Pain Medicine

## 2013-02-15 ENCOUNTER — Emergency Department: Payer: Self-pay | Admitting: Emergency Medicine

## 2013-02-15 LAB — COMPREHENSIVE METABOLIC PANEL
Albumin: 3.8 g/dL (ref 3.4–5.0)
Alkaline Phosphatase: 65 U/L (ref 50–136)
Bilirubin,Total: 0.5 mg/dL (ref 0.2–1.0)
Chloride: 108 mmol/L — ABNORMAL HIGH (ref 98–107)
Co2: 21 mmol/L (ref 21–32)
EGFR (African American): >60
Glucose: 94 mg/dL (ref 65–99)
Osmolality: 272 (ref 275–301)
Potassium: 4.2 mmol/L (ref 3.5–5.1)
SGOT(AST): 18 U/L (ref 15–37)
SGPT (ALT): 18 U/L (ref 12–78)
Total Protein: 7.8 g/dL (ref 6.4–8.2)

## 2013-02-15 LAB — URINALYSIS, COMPLETE
Blood: NEGATIVE
Glucose,UR: NEGATIVE mg/dL (ref 0–75)
Leukocyte Esterase: NEGATIVE
Nitrite: NEGATIVE
Protein: NEGATIVE
Specific Gravity: 1.004 (ref 1.003–1.030)
Squamous Epithelial: 8
WBC UR: <1 /HPF (ref 0–5)

## 2013-02-15 LAB — LIPASE, BLOOD: Lipase: 90 U/L (ref 73–393)

## 2013-02-15 LAB — CBC
MCH: 30.8 pg (ref 26.0–34.0)
MCV: 90 fL (ref 80–100)
Platelet: 351 10*3/uL (ref 150–440)
WBC: 7.2 10*3/uL (ref 3.6–11.0)

## 2013-02-28 ENCOUNTER — Ambulatory Visit: Payer: Self-pay | Admitting: Pain Medicine

## 2013-03-12 ENCOUNTER — Ambulatory Visit: Payer: Self-pay | Admitting: Pain Medicine

## 2013-03-12 ENCOUNTER — Emergency Department: Payer: Self-pay | Admitting: Emergency Medicine

## 2013-03-12 LAB — COMPREHENSIVE METABOLIC PANEL
Alkaline Phosphatase: 75 U/L (ref 50–136)
Anion Gap: 2 — ABNORMAL LOW (ref 7–16)
BUN: 6 mg/dL — ABNORMAL LOW (ref 7–18)
Bilirubin,Total: 0.5 mg/dL (ref 0.2–1.0)
Co2: 31 mmol/L (ref 21–32)
Creatinine: 0.75 mg/dL (ref 0.60–1.30)
EGFR (Non-African Amer.): >60
SGOT(AST): 27 U/L (ref 15–37)
SGPT (ALT): 27 U/L (ref 12–78)
Sodium: 137 mmol/L (ref 136–145)
Total Protein: 7.6 g/dL (ref 6.4–8.2)

## 2013-03-12 LAB — CBC
HCT: 40.3 % (ref 35.0–47.0)
HGB: 13.8 g/dL (ref 12.0–16.0)
Platelet: 303 10*3/uL (ref 150–440)
RDW: 14.4 % (ref 11.5–14.5)
WBC: 7.3 10*3/uL (ref 3.6–11.0)

## 2013-03-12 LAB — TROPONIN I: Troponin-I: <0.02 ng/mL

## 2013-03-28 ENCOUNTER — Ambulatory Visit: Payer: Self-pay | Admitting: Pain Medicine

## 2013-04-03 ENCOUNTER — Ambulatory Visit: Payer: Self-pay | Admitting: Pain Medicine

## 2013-04-11 ENCOUNTER — Ambulatory Visit: Payer: Self-pay | Admitting: Pain Medicine

## 2013-05-08 ENCOUNTER — Emergency Department: Payer: Self-pay | Admitting: Emergency Medicine

## 2013-05-08 ENCOUNTER — Ambulatory Visit: Payer: Self-pay | Admitting: Pain Medicine

## 2013-06-23 ENCOUNTER — Emergency Department: Payer: Self-pay | Admitting: Emergency Medicine

## 2013-06-23 LAB — PREGNANCY, URINE: PREGNANCY TEST, URINE: NEGATIVE m[IU]/mL

## 2013-06-23 LAB — URINALYSIS, COMPLETE
Bacteria: NONE SEEN
Bilirubin,UR: NEGATIVE
Glucose,UR: NEGATIVE mg/dL (ref 0–75)
KETONE: NEGATIVE
Leukocyte Esterase: NEGATIVE
Nitrite: NEGATIVE
PH: 8 (ref 4.5–8.0)
Protein: NEGATIVE
RBC,UR: <1 /HPF (ref 0–5)
Specific Gravity: 1.009 (ref 1.003–1.030)
WBC UR: 1 /HPF (ref 0–5)

## 2013-06-23 LAB — CBC
HCT: 41.4 % (ref 35.0–47.0)
HGB: 13.7 g/dL (ref 12.0–16.0)
MCH: 29.7 pg (ref 26.0–34.0)
MCHC: 33 g/dL (ref 32.0–36.0)
MCV: 90 fL (ref 80–100)
PLATELETS: 328 10*3/uL (ref 150–440)
RBC: 4.6 10*6/uL (ref 3.80–5.20)
RDW: 14.3 % (ref 11.5–14.5)
WBC: 7.7 10*3/uL (ref 3.6–11.0)

## 2013-06-23 LAB — BASIC METABOLIC PANEL
Anion Gap: 8 (ref 7–16)
BUN: 7 mg/dL (ref 7–18)
CALCIUM: 8.7 mg/dL (ref 8.5–10.1)
CREATININE: 0.89 mg/dL (ref 0.60–1.30)
Chloride: 106 mmol/L (ref 98–107)
Co2: 21 mmol/L (ref 21–32)
EGFR (African American): >60
EGFR (Non-African Amer.): >60
GLUCOSE: 91 mg/dL (ref 65–99)
Osmolality: 268 (ref 275–301)
Potassium: 3.8 mmol/L (ref 3.5–5.1)
SODIUM: 135 mmol/L — AB (ref 136–145)

## 2013-06-23 LAB — HEPATIC FUNCTION PANEL A (ARMC)
Albumin: 3.5 g/dL (ref 3.4–5.0)
Alkaline Phosphatase: 64 U/L
Bilirubin, Direct: 0.1 mg/dL (ref 0.00–0.20)
Bilirubin,Total: 0.4 mg/dL (ref 0.2–1.0)
SGOT(AST): 16 U/L (ref 15–37)
SGPT (ALT): 19 U/L (ref 12–78)
Total Protein: 7.9 g/dL (ref 6.4–8.2)

## 2013-06-23 LAB — LIPASE, BLOOD: Lipase: 52 U/L — ABNORMAL LOW (ref 73–393)

## 2013-06-23 LAB — TROPONIN I: Troponin-I: <0.02 ng/mL

## 2013-06-26 ENCOUNTER — Ambulatory Visit: Payer: Self-pay | Admitting: Pain Medicine

## 2013-07-02 ENCOUNTER — Ambulatory Visit: Payer: Self-pay | Admitting: Pain Medicine

## 2013-08-07 ENCOUNTER — Ambulatory Visit: Payer: Self-pay | Admitting: Pain Medicine

## 2013-08-13 ENCOUNTER — Emergency Department: Payer: Self-pay | Admitting: Emergency Medicine

## 2013-08-15 ENCOUNTER — Ambulatory Visit: Payer: Self-pay | Admitting: Pain Medicine

## 2013-09-02 ENCOUNTER — Emergency Department: Payer: Self-pay | Admitting: Emergency Medicine

## 2013-09-04 ENCOUNTER — Ambulatory Visit: Payer: Self-pay | Admitting: Pain Medicine

## 2013-09-12 ENCOUNTER — Inpatient Hospital Stay: Payer: Self-pay

## 2013-09-12 ENCOUNTER — Ambulatory Visit: Payer: Self-pay | Admitting: Pain Medicine

## 2013-09-12 LAB — CBC
HCT: 38.9 % (ref 35.0–47.0)
HGB: 12.6 g/dL (ref 12.0–16.0)
MCH: 29.2 pg (ref 26.0–34.0)
MCHC: 32.5 g/dL (ref 32.0–36.0)
MCV: 90 fL (ref 80–100)
Platelet: 295 10*3/uL (ref 150–440)
RBC: 4.33 10*6/uL (ref 3.80–5.20)
RDW: 14 % (ref 11.5–14.5)
WBC: 6.1 10*3/uL (ref 3.6–11.0)

## 2013-09-12 LAB — COMPREHENSIVE METABOLIC PANEL
Albumin: 3.3 g/dL — ABNORMAL LOW (ref 3.4–5.0)
Alkaline Phosphatase: 51 U/L
Anion Gap: 4 — ABNORMAL LOW (ref 7–16)
BUN: 6 mg/dL — ABNORMAL LOW (ref 7–18)
Bilirubin,Total: 0.5 mg/dL (ref 0.2–1.0)
CALCIUM: 8.3 mg/dL — AB (ref 8.5–10.1)
Chloride: 105 mmol/L (ref 98–107)
Co2: 29 mmol/L (ref 21–32)
Creatinine: 0.69 mg/dL (ref 0.60–1.30)
EGFR (African American): >60
EGFR (Non-African Amer.): >60
Glucose: 132 mg/dL — ABNORMAL HIGH (ref 65–99)
Osmolality: 275 (ref 275–301)
Potassium: 3.5 mmol/L (ref 3.5–5.1)
SGOT(AST): 16 U/L (ref 15–37)
SGPT (ALT): 16 U/L (ref 12–78)
SODIUM: 138 mmol/L (ref 136–145)
Total Protein: 7.2 g/dL (ref 6.4–8.2)

## 2013-09-26 ENCOUNTER — Ambulatory Visit: Payer: Self-pay | Admitting: Pain Medicine

## 2013-10-17 ENCOUNTER — Ambulatory Visit: Payer: Self-pay | Admitting: Pain Medicine

## 2013-10-24 ENCOUNTER — Observation Stay: Payer: Self-pay | Admitting: Surgery

## 2013-10-24 LAB — COMPREHENSIVE METABOLIC PANEL
ALBUMIN: 4.2 g/dL (ref 3.4–5.0)
Alkaline Phosphatase: 76 U/L
Anion Gap: 10 (ref 7–16)
BUN: 12 mg/dL (ref 7–18)
Bilirubin,Total: 0.6 mg/dL (ref 0.2–1.0)
CALCIUM: 9.2 mg/dL (ref 8.5–10.1)
Chloride: 104 mmol/L (ref 98–107)
Co2: 22 mmol/L (ref 21–32)
Creatinine: 1.1 mg/dL (ref 0.60–1.30)
EGFR (African American): >60
Glucose: 161 mg/dL — ABNORMAL HIGH (ref 65–99)
OSMOLALITY: 275 (ref 275–301)
Potassium: 3.6 mmol/L (ref 3.5–5.1)
SGOT(AST): 23 U/L (ref 15–37)
SGPT (ALT): 30 U/L (ref 12–78)
Sodium: 136 mmol/L (ref 136–145)
Total Protein: 9.2 g/dL — ABNORMAL HIGH (ref 6.4–8.2)

## 2013-10-24 LAB — URINALYSIS, COMPLETE
Bacteria: NONE SEEN
Bilirubin,UR: NEGATIVE
Glucose,UR: NEGATIVE mg/dL (ref 0–75)
Leukocyte Esterase: NEGATIVE
Nitrite: NEGATIVE
Ph: 6 (ref 4.5–8.0)
Protein: 100
Specific Gravity: 1.03 (ref 1.003–1.030)
WBC UR: 3 /HPF (ref 0–5)

## 2013-10-24 LAB — TROPONIN I: Troponin-I: <0.02 ng/mL

## 2013-10-24 LAB — CBC
HCT: 47.9 % — AB (ref 35.0–47.0)
HGB: 15.7 g/dL (ref 12.0–16.0)
MCH: 29.6 pg (ref 26.0–34.0)
MCHC: 32.9 g/dL (ref 32.0–36.0)
MCV: 90 fL (ref 80–100)
Platelet: 391 10*3/uL (ref 150–440)
RBC: 5.3 10*6/uL — ABNORMAL HIGH (ref 3.80–5.20)
RDW: 14.1 % (ref 11.5–14.5)
WBC: 11.8 10*3/uL — AB (ref 3.6–11.0)

## 2013-10-24 LAB — LIPASE, BLOOD: LIPASE: 84 U/L (ref 73–393)

## 2013-10-25 LAB — CBC WITH DIFFERENTIAL/PLATELET
BASOS PCT: 0.9 %
Basophil #: 0.1 10*3/uL (ref 0.0–0.1)
EOS PCT: 0.3 %
Eosinophil #: 0 10*3/uL (ref 0.0–0.7)
HCT: 42.6 % (ref 35.0–47.0)
HGB: 13.6 g/dL (ref 12.0–16.0)
Lymphocyte #: 3.1 10*3/uL (ref 1.0–3.6)
Lymphocyte %: 34.8 %
MCH: 29.1 pg (ref 26.0–34.0)
MCHC: 32 g/dL (ref 32.0–36.0)
MCV: 91 fL (ref 80–100)
Monocyte #: 0.7 x10 3/mm (ref 0.2–0.9)
Monocyte %: 8.2 %
NEUTROS ABS: 5.1 10*3/uL (ref 1.4–6.5)
NEUTROS PCT: 55.8 %
PLATELETS: 327 10*3/uL (ref 150–440)
RBC: 4.68 10*6/uL (ref 3.80–5.20)
RDW: 14.2 % (ref 11.5–14.5)
WBC: 9.1 10*3/uL (ref 3.6–11.0)

## 2013-10-25 LAB — COMPREHENSIVE METABOLIC PANEL
ALK PHOS: 63 U/L
ALT: 28 U/L (ref 12–78)
Albumin: 3.5 g/dL (ref 3.4–5.0)
Anion Gap: 5 — ABNORMAL LOW (ref 7–16)
BILIRUBIN TOTAL: 0.9 mg/dL (ref 0.2–1.0)
BUN: 13 mg/dL (ref 7–18)
CO2: 29 mmol/L (ref 21–32)
Calcium, Total: 8.6 mg/dL (ref 8.5–10.1)
Chloride: 102 mmol/L (ref 98–107)
Creatinine: 0.91 mg/dL (ref 0.60–1.30)
EGFR (African American): >60
EGFR (Non-African Amer.): >60
Glucose: 109 mg/dL — ABNORMAL HIGH (ref 65–99)
Osmolality: 273 (ref 275–301)
Potassium: 3.6 mmol/L (ref 3.5–5.1)
SGOT(AST): 20 U/L (ref 15–37)
Sodium: 136 mmol/L (ref 136–145)
Total Protein: 7.6 g/dL (ref 6.4–8.2)

## 2013-10-29 ENCOUNTER — Emergency Department: Payer: Self-pay | Admitting: Emergency Medicine

## 2013-10-29 LAB — CBC
HCT: 35.7 % (ref 35.0–47.0)
HGB: 11.8 g/dL — ABNORMAL LOW (ref 12.0–16.0)
MCH: 30.2 pg (ref 26.0–34.0)
MCHC: 33.1 g/dL (ref 32.0–36.0)
MCV: 91 fL (ref 80–100)
PLATELETS: 243 10*3/uL (ref 150–440)
RBC: 3.91 10*6/uL (ref 3.80–5.20)
RDW: 14.8 % — AB (ref 11.5–14.5)
WBC: 6.2 10*3/uL (ref 3.6–11.0)

## 2013-10-29 LAB — URINALYSIS, COMPLETE
Bacteria: NONE SEEN
Bilirubin,UR: NEGATIVE
Blood: NEGATIVE
GLUCOSE, UR: NEGATIVE mg/dL (ref 0–75)
Ketone: NEGATIVE
Leukocyte Esterase: NEGATIVE
NITRITE: NEGATIVE
PH: 6 (ref 4.5–8.0)
PROTEIN: NEGATIVE
RBC,UR: <1 /HPF (ref 0–5)
SPECIFIC GRAVITY: 1.019 (ref 1.003–1.030)

## 2013-10-29 LAB — COMPREHENSIVE METABOLIC PANEL
ALT: 16 U/L (ref 12–78)
ANION GAP: 3 — AB (ref 7–16)
Albumin: 3 g/dL — ABNORMAL LOW (ref 3.4–5.0)
Alkaline Phosphatase: 54 U/L
BUN: 11 mg/dL (ref 7–18)
Bilirubin,Total: 0.3 mg/dL (ref 0.2–1.0)
CO2: 32 mmol/L (ref 21–32)
Calcium, Total: 8.4 mg/dL — ABNORMAL LOW (ref 8.5–10.1)
Chloride: 103 mmol/L (ref 98–107)
Creatinine: 0.96 mg/dL (ref 0.60–1.30)
EGFR (African American): >60
Glucose: 89 mg/dL (ref 65–99)
Osmolality: 275 (ref 275–301)
Potassium: 3.8 mmol/L (ref 3.5–5.1)
SGOT(AST): 11 U/L — ABNORMAL LOW (ref 15–37)
Sodium: 138 mmol/L (ref 136–145)
Total Protein: 6.5 g/dL (ref 6.4–8.2)

## 2013-10-29 LAB — PATHOLOGY REPORT

## 2013-10-30 ENCOUNTER — Ambulatory Visit: Payer: Self-pay | Admitting: Pain Medicine

## 2013-11-06 ENCOUNTER — Emergency Department: Payer: Self-pay | Admitting: Emergency Medicine

## 2013-11-06 LAB — CBC
HCT: 42.4 % (ref 35.0–47.0)
HGB: 13.5 g/dL (ref 12.0–16.0)
MCH: 29.4 pg (ref 26.0–34.0)
MCHC: 31.8 g/dL — ABNORMAL LOW (ref 32.0–36.0)
MCV: 92 fL (ref 80–100)
PLATELETS: 277 10*3/uL (ref 150–440)
RBC: 4.6 10*6/uL (ref 3.80–5.20)
RDW: 15 % — AB (ref 11.5–14.5)
WBC: 7.3 10*3/uL (ref 3.6–11.0)

## 2013-11-06 LAB — URINALYSIS, COMPLETE
BACTERIA: NONE SEEN
Bilirubin,UR: NEGATIVE
Blood: NEGATIVE
GLUCOSE, UR: NEGATIVE mg/dL (ref 0–75)
Ketone: NEGATIVE
Leukocyte Esterase: NEGATIVE
NITRITE: NEGATIVE
Ph: 8 (ref 4.5–8.0)
Protein: NEGATIVE
Specific Gravity: 1.005 (ref 1.003–1.030)
Squamous Epithelial: 4

## 2013-11-06 LAB — COMPREHENSIVE METABOLIC PANEL
ALK PHOS: 62 U/L
Albumin: 3.5 g/dL (ref 3.4–5.0)
Anion Gap: 7 (ref 7–16)
BUN: 5 mg/dL — ABNORMAL LOW (ref 7–18)
Bilirubin,Total: 0.4 mg/dL (ref 0.2–1.0)
CALCIUM: 9.1 mg/dL (ref 8.5–10.1)
Chloride: 104 mmol/L (ref 98–107)
Co2: 26 mmol/L (ref 21–32)
Creatinine: 0.97 mg/dL (ref 0.60–1.30)
EGFR (African American): >60
GLUCOSE: 90 mg/dL (ref 65–99)
Osmolality: 271 (ref 275–301)
POTASSIUM: 4.1 mmol/L (ref 3.5–5.1)
SGOT(AST): 19 U/L (ref 15–37)
SGPT (ALT): 17 U/L (ref 12–78)
Sodium: 137 mmol/L (ref 136–145)
Total Protein: 7.9 g/dL (ref 6.4–8.2)

## 2013-11-06 LAB — LIPASE, BLOOD: LIPASE: 56 U/L — AB (ref 73–393)

## 2013-11-12 ENCOUNTER — Emergency Department: Payer: Self-pay | Admitting: Emergency Medicine

## 2013-11-12 LAB — CBC WITH DIFFERENTIAL/PLATELET
BASOS ABS: 0.1 10*3/uL (ref 0.0–0.1)
Basophil %: 1.1 %
Eosinophil #: 0.1 10*3/uL (ref 0.0–0.7)
Eosinophil %: 2 %
HCT: 39.5 % (ref 35.0–47.0)
HGB: 12.6 g/dL (ref 12.0–16.0)
LYMPHS ABS: 2.3 10*3/uL (ref 1.0–3.6)
Lymphocyte %: 32.3 %
MCH: 29.3 pg (ref 26.0–34.0)
MCHC: 32 g/dL (ref 32.0–36.0)
MCV: 92 fL (ref 80–100)
MONO ABS: 0.3 x10 3/mm (ref 0.2–0.9)
Monocyte %: 4.7 %
NEUTROS ABS: 4.2 10*3/uL (ref 1.4–6.5)
Neutrophil %: 59.9 %
PLATELETS: 307 10*3/uL (ref 150–440)
RBC: 4.31 10*6/uL (ref 3.80–5.20)
RDW: 14.8 % — AB (ref 11.5–14.5)
WBC: 7 10*3/uL (ref 3.6–11.0)

## 2013-11-12 LAB — URINALYSIS, COMPLETE
Bacteria: NONE SEEN
Bilirubin,UR: NEGATIVE
Blood: NEGATIVE
GLUCOSE, UR: NEGATIVE mg/dL (ref 0–75)
Ketone: NEGATIVE
Leukocyte Esterase: NEGATIVE
NITRITE: NEGATIVE
Ph: 6 (ref 4.5–8.0)
Protein: NEGATIVE
RBC,UR: 2 /HPF (ref 0–5)
Specific Gravity: 1.026 (ref 1.003–1.030)
WBC UR: 5 /HPF (ref 0–5)

## 2013-11-12 LAB — COMPREHENSIVE METABOLIC PANEL
ALK PHOS: 57 U/L
ANION GAP: 6 — AB (ref 7–16)
Albumin: 3.2 g/dL — ABNORMAL LOW (ref 3.4–5.0)
BILIRUBIN TOTAL: 0.3 mg/dL (ref 0.2–1.0)
BUN: 5 mg/dL — ABNORMAL LOW (ref 7–18)
Calcium, Total: 8.8 mg/dL (ref 8.5–10.1)
Chloride: 103 mmol/L (ref 98–107)
Co2: 27 mmol/L (ref 21–32)
Creatinine: 0.83 mg/dL (ref 0.60–1.30)
EGFR (African American): >60
GLUCOSE: 98 mg/dL (ref 65–99)
Osmolality: 269 (ref 275–301)
Potassium: 3.7 mmol/L (ref 3.5–5.1)
SGOT(AST): 11 U/L — ABNORMAL LOW (ref 15–37)
SGPT (ALT): 14 U/L (ref 12–78)
SODIUM: 136 mmol/L (ref 136–145)
TOTAL PROTEIN: 7.1 g/dL (ref 6.4–8.2)

## 2013-11-12 LAB — LIPASE, BLOOD: LIPASE: 67 U/L — AB (ref 73–393)

## 2013-11-21 ENCOUNTER — Ambulatory Visit: Payer: Self-pay | Admitting: Surgery

## 2013-11-21 LAB — CBC WITH DIFFERENTIAL/PLATELET
Basophil #: 0 10*3/uL (ref 0.0–0.1)
Basophil %: 0.8 %
Eosinophil #: 0.1 10*3/uL (ref 0.0–0.7)
Eosinophil %: 1.8 %
HCT: 38.7 % (ref 35.0–47.0)
HGB: 12.6 g/dL (ref 12.0–16.0)
LYMPHS ABS: 3.1 10*3/uL (ref 1.0–3.6)
LYMPHS PCT: 48.3 %
MCH: 29.8 pg (ref 26.0–34.0)
MCHC: 32.5 g/dL (ref 32.0–36.0)
MCV: 92 fL (ref 80–100)
MONO ABS: 0.3 x10 3/mm (ref 0.2–0.9)
MONOS PCT: 4.9 %
NEUTROS ABS: 2.8 10*3/uL (ref 1.4–6.5)
NEUTROS PCT: 44.2 %
Platelet: 291 10*3/uL (ref 150–440)
RBC: 4.22 10*6/uL (ref 3.80–5.20)
RDW: 14.9 % — ABNORMAL HIGH (ref 11.5–14.5)
WBC: 6.4 10*3/uL (ref 3.6–11.0)

## 2013-11-21 LAB — BASIC METABOLIC PANEL
ANION GAP: 4 — AB (ref 7–16)
BUN: 12 mg/dL (ref 7–18)
CO2: 29 mmol/L (ref 21–32)
Calcium, Total: 8.2 mg/dL — ABNORMAL LOW (ref 8.5–10.1)
Chloride: 104 mmol/L (ref 98–107)
Creatinine: 1.14 mg/dL (ref 0.60–1.30)
EGFR (Non-African Amer.): 58 — ABNORMAL LOW
Glucose: 86 mg/dL (ref 65–99)
Osmolality: 273 (ref 275–301)
Potassium: 3.6 mmol/L (ref 3.5–5.1)
SODIUM: 137 mmol/L (ref 136–145)

## 2013-11-23 ENCOUNTER — Inpatient Hospital Stay: Payer: Self-pay | Admitting: Surgery

## 2013-11-24 LAB — CBC WITH DIFFERENTIAL/PLATELET
BASOS PCT: 0.4 %
Basophil #: 0.1 10*3/uL (ref 0.0–0.1)
EOS ABS: 0 10*3/uL (ref 0.0–0.7)
Eosinophil %: 0 %
HCT: 38.4 % (ref 35.0–47.0)
HGB: 12.5 g/dL (ref 12.0–16.0)
Lymphocyte #: 1.9 10*3/uL (ref 1.0–3.6)
Lymphocyte %: 15.6 %
MCH: 29.5 pg (ref 26.0–34.0)
MCHC: 32.5 g/dL (ref 32.0–36.0)
MCV: 91 fL (ref 80–100)
MONO ABS: 0.9 x10 3/mm (ref 0.2–0.9)
Monocyte %: 7.6 %
NEUTROS ABS: 9 10*3/uL — AB (ref 1.4–6.5)
Neutrophil %: 76.4 %
Platelet: 365 10*3/uL (ref 150–440)
RBC: 4.23 10*6/uL (ref 3.80–5.20)
RDW: 15.1 % — ABNORMAL HIGH (ref 11.5–14.5)
WBC: 11.9 10*3/uL — ABNORMAL HIGH (ref 3.6–11.0)

## 2013-11-24 LAB — BASIC METABOLIC PANEL
Anion Gap: 4 — ABNORMAL LOW (ref 7–16)
BUN: 7 mg/dL (ref 7–18)
CO2: 31 mmol/L (ref 21–32)
Calcium, Total: 8.6 mg/dL (ref 8.5–10.1)
Chloride: 99 mmol/L (ref 98–107)
Creatinine: 0.72 mg/dL (ref 0.60–1.30)
EGFR (African American): >60
EGFR (Non-African Amer.): >60
Glucose: 176 mg/dL — ABNORMAL HIGH (ref 65–99)
OSMOLALITY: 271 (ref 275–301)
Potassium: 3.6 mmol/L (ref 3.5–5.1)
Sodium: 134 mmol/L — ABNORMAL LOW (ref 136–145)

## 2013-11-25 LAB — CBC WITH DIFFERENTIAL/PLATELET
Basophil #: 0 10*3/uL (ref 0.0–0.1)
Basophil %: 0.3 %
Eosinophil #: 0 10*3/uL (ref 0.0–0.7)
Eosinophil %: 0.3 %
HCT: 33.2 % — AB (ref 35.0–47.0)
HGB: 10.8 g/dL — AB (ref 12.0–16.0)
LYMPHS ABS: 3.3 10*3/uL (ref 1.0–3.6)
LYMPHS PCT: 25.6 %
MCH: 29.8 pg (ref 26.0–34.0)
MCHC: 32.6 g/dL (ref 32.0–36.0)
MCV: 92 fL (ref 80–100)
Monocyte #: 1.2 x10 3/mm — ABNORMAL HIGH (ref 0.2–0.9)
Monocyte %: 9.1 %
Neutrophil #: 8.3 10*3/uL — ABNORMAL HIGH (ref 1.4–6.5)
Neutrophil %: 64.7 %
Platelet: 329 10*3/uL (ref 150–440)
RBC: 3.62 10*6/uL — AB (ref 3.80–5.20)
RDW: 15.1 % — ABNORMAL HIGH (ref 11.5–14.5)
WBC: 12.8 10*3/uL — ABNORMAL HIGH (ref 3.6–11.0)

## 2013-11-25 LAB — BASIC METABOLIC PANEL
Anion Gap: 6 — ABNORMAL LOW (ref 7–16)
BUN: 19 mg/dL — ABNORMAL HIGH (ref 7–18)
CHLORIDE: 99 mmol/L (ref 98–107)
CO2: 29 mmol/L (ref 21–32)
CREATININE: 1.76 mg/dL — AB (ref 0.60–1.30)
Calcium, Total: 8.3 mg/dL — ABNORMAL LOW (ref 8.5–10.1)
GFR CALC AF AMER: 40 — AB
GFR CALC NON AF AMER: 34 — AB
GLUCOSE: 108 mg/dL — AB (ref 65–99)
Osmolality: 271 (ref 275–301)
Potassium: 3.7 mmol/L (ref 3.5–5.1)
SODIUM: 134 mmol/L — AB (ref 136–145)

## 2013-11-26 LAB — CBC WITH DIFFERENTIAL/PLATELET
Basophil #: 0.1 10*3/uL (ref 0.0–0.1)
Basophil %: 0.6 %
Eosinophil #: 0.1 10*3/uL (ref 0.0–0.7)
Eosinophil %: 0.7 %
HCT: 29.6 % — AB (ref 35.0–47.0)
HGB: 9.5 g/dL — AB (ref 12.0–16.0)
Lymphocyte #: 2.7 10*3/uL (ref 1.0–3.6)
Lymphocyte %: 25.5 %
MCH: 29.6 pg (ref 26.0–34.0)
MCHC: 32.1 g/dL (ref 32.0–36.0)
MCV: 92 fL (ref 80–100)
MONO ABS: 1 x10 3/mm — AB (ref 0.2–0.9)
Monocyte %: 9.2 %
NEUTROS PCT: 64 %
Neutrophil #: 6.8 10*3/uL — ABNORMAL HIGH (ref 1.4–6.5)
Platelet: 288 10*3/uL (ref 150–440)
RBC: 3.21 10*6/uL — ABNORMAL LOW (ref 3.80–5.20)
RDW: 15 % — AB (ref 11.5–14.5)
WBC: 10.7 10*3/uL (ref 3.6–11.0)

## 2013-11-26 LAB — BASIC METABOLIC PANEL
Anion Gap: 5 — ABNORMAL LOW (ref 7–16)
BUN: 11 mg/dL (ref 7–18)
CALCIUM: 8.4 mg/dL — AB (ref 8.5–10.1)
Chloride: 102 mmol/L (ref 98–107)
Co2: 30 mmol/L (ref 21–32)
Creatinine: 0.83 mg/dL (ref 0.60–1.30)
EGFR (African American): >60
EGFR (Non-African Amer.): >60
Glucose: 108 mg/dL — ABNORMAL HIGH (ref 65–99)
Osmolality: 274 (ref 275–301)
POTASSIUM: 3.6 mmol/L (ref 3.5–5.1)
SODIUM: 137 mmol/L (ref 136–145)

## 2013-11-27 LAB — CBC WITH DIFFERENTIAL/PLATELET
BASOS ABS: 0 10*3/uL (ref 0.0–0.1)
Basophil %: 0.4 %
Eosinophil #: 0.1 10*3/uL (ref 0.0–0.7)
Eosinophil %: 0.9 %
HCT: 29 % — AB (ref 35.0–47.0)
HGB: 9.6 g/dL — AB (ref 12.0–16.0)
LYMPHS ABS: 2.4 10*3/uL (ref 1.0–3.6)
Lymphocyte %: 22.6 %
MCH: 30.1 pg (ref 26.0–34.0)
MCHC: 33.1 g/dL (ref 32.0–36.0)
MCV: 91 fL (ref 80–100)
MONO ABS: 0.8 x10 3/mm (ref 0.2–0.9)
Monocyte %: 7.9 %
NEUTROS ABS: 7.3 10*3/uL — AB (ref 1.4–6.5)
Neutrophil %: 68.2 %
PLATELETS: 301 10*3/uL (ref 150–440)
RBC: 3.18 10*6/uL — AB (ref 3.80–5.20)
RDW: 14.7 % — AB (ref 11.5–14.5)
WBC: 10.7 10*3/uL (ref 3.6–11.0)

## 2013-11-27 LAB — BASIC METABOLIC PANEL
Anion Gap: 3 — ABNORMAL LOW (ref 7–16)
BUN: 4 mg/dL — AB (ref 7–18)
CHLORIDE: 101 mmol/L (ref 98–107)
CREATININE: 0.75 mg/dL (ref 0.60–1.30)
Calcium, Total: 8.2 mg/dL — ABNORMAL LOW (ref 8.5–10.1)
Co2: 33 mmol/L — ABNORMAL HIGH (ref 21–32)
EGFR (African American): >60
EGFR (Non-African Amer.): >60
Glucose: 118 mg/dL — ABNORMAL HIGH (ref 65–99)
Osmolality: 272 (ref 275–301)
Potassium: 3.5 mmol/L (ref 3.5–5.1)
Sodium: 137 mmol/L (ref 136–145)

## 2013-12-04 ENCOUNTER — Observation Stay: Payer: Self-pay | Admitting: Surgery

## 2013-12-04 ENCOUNTER — Ambulatory Visit: Payer: Self-pay | Admitting: Pain Medicine

## 2013-12-04 LAB — CBC
HCT: 33.4 % — ABNORMAL LOW (ref 35.0–47.0)
HGB: 10.8 g/dL — ABNORMAL LOW (ref 12.0–16.0)
MCH: 29.3 pg (ref 26.0–34.0)
MCHC: 32.3 g/dL (ref 32.0–36.0)
MCV: 91 fL (ref 80–100)
PLATELETS: 516 10*3/uL — AB (ref 150–440)
RBC: 3.67 10*6/uL — AB (ref 3.80–5.20)
RDW: 15.3 % — AB (ref 11.5–14.5)
WBC: 15.6 10*3/uL — ABNORMAL HIGH (ref 3.6–11.0)

## 2013-12-04 LAB — PATHOLOGY REPORT

## 2013-12-04 LAB — COMPREHENSIVE METABOLIC PANEL
ALK PHOS: 96 U/L
ANION GAP: 8 (ref 7–16)
Albumin: 3.3 g/dL — ABNORMAL LOW (ref 3.4–5.0)
BILIRUBIN TOTAL: 0.4 mg/dL (ref 0.2–1.0)
BUN: 5 mg/dL — ABNORMAL LOW (ref 7–18)
CREATININE: 0.83 mg/dL (ref 0.60–1.30)
Calcium, Total: 9.4 mg/dL (ref 8.5–10.1)
Chloride: 103 mmol/L (ref 98–107)
Co2: 27 mmol/L (ref 21–32)
EGFR (African American): >60
EGFR (Non-African Amer.): >60
GLUCOSE: 91 mg/dL (ref 65–99)
Osmolality: 273 (ref 275–301)
POTASSIUM: 3.3 mmol/L — AB (ref 3.5–5.1)
SGOT(AST): 33 U/L (ref 15–37)
SGPT (ALT): 50 U/L (ref 12–78)
Sodium: 138 mmol/L (ref 136–145)
Total Protein: 8.2 g/dL (ref 6.4–8.2)

## 2013-12-09 ENCOUNTER — Emergency Department: Payer: Self-pay | Admitting: Emergency Medicine

## 2013-12-09 LAB — URINALYSIS, COMPLETE
BILIRUBIN, UR: NEGATIVE
Bacteria: NONE SEEN
Blood: NEGATIVE
Glucose,UR: NEGATIVE mg/dL (ref 0–75)
KETONE: NEGATIVE
Leukocyte Esterase: NEGATIVE
NITRITE: NEGATIVE
PROTEIN: NEGATIVE
Ph: 7 (ref 4.5–8.0)
RBC,UR: 1 /HPF (ref 0–5)
SPECIFIC GRAVITY: 1.011 (ref 1.003–1.030)
Squamous Epithelial: 22

## 2013-12-09 LAB — COMPREHENSIVE METABOLIC PANEL
ALT: 23 U/L (ref 12–78)
Albumin: 2.9 g/dL — ABNORMAL LOW (ref 3.4–5.0)
Alkaline Phosphatase: 77 U/L
Anion Gap: 9 (ref 7–16)
BUN: 5 mg/dL — ABNORMAL LOW (ref 7–18)
Bilirubin,Total: 0.3 mg/dL (ref 0.2–1.0)
CREATININE: 0.9 mg/dL (ref 0.60–1.30)
Calcium, Total: 8.7 mg/dL (ref 8.5–10.1)
Chloride: 105 mmol/L (ref 98–107)
Co2: 25 mmol/L (ref 21–32)
EGFR (Non-African Amer.): >60
GLUCOSE: 103 mg/dL — AB (ref 65–99)
OSMOLALITY: 275 (ref 275–301)
POTASSIUM: 3.7 mmol/L (ref 3.5–5.1)
SGOT(AST): 14 U/L — ABNORMAL LOW (ref 15–37)
Sodium: 139 mmol/L (ref 136–145)
Total Protein: 7.6 g/dL (ref 6.4–8.2)

## 2013-12-09 LAB — CBC
HCT: 31.9 % — ABNORMAL LOW (ref 35.0–47.0)
HGB: 10.4 g/dL — ABNORMAL LOW (ref 12.0–16.0)
MCH: 29 pg (ref 26.0–34.0)
MCHC: 32.6 g/dL (ref 32.0–36.0)
MCV: 89 fL (ref 80–100)
PLATELETS: 668 10*3/uL — AB (ref 150–440)
RBC: 3.59 10*6/uL — ABNORMAL LOW (ref 3.80–5.20)
RDW: 14.9 % — AB (ref 11.5–14.5)
WBC: 7.6 10*3/uL (ref 3.6–11.0)

## 2013-12-09 LAB — LIPASE, BLOOD: LIPASE: 83 U/L (ref 73–393)

## 2013-12-10 LAB — COMPREHENSIVE METABOLIC PANEL
ALBUMIN: 3.2 g/dL — AB (ref 3.4–5.0)
ALK PHOS: 71 U/L
ANION GAP: 7 (ref 7–16)
AST: 9 U/L — AB (ref 15–37)
BUN: 5 mg/dL — AB (ref 7–18)
Bilirubin,Total: 0.3 mg/dL (ref 0.2–1.0)
CALCIUM: 9 mg/dL (ref 8.5–10.1)
CO2: 30 mmol/L (ref 21–32)
CREATININE: 0.8 mg/dL (ref 0.60–1.30)
Chloride: 102 mmol/L (ref 98–107)
EGFR (African American): >60
GLUCOSE: 114 mg/dL — AB (ref 65–99)
OSMOLALITY: 276 (ref 275–301)
Potassium: 3.3 mmol/L — ABNORMAL LOW (ref 3.5–5.1)
SGPT (ALT): 19 U/L (ref 12–78)
SODIUM: 139 mmol/L (ref 136–145)
Total Protein: 7.8 g/dL (ref 6.4–8.2)

## 2013-12-10 LAB — CBC WITH DIFFERENTIAL/PLATELET
BASOS ABS: 0 10*3/uL (ref 0.0–0.1)
Basophil %: 0.7 %
EOS ABS: 0.1 10*3/uL (ref 0.0–0.7)
EOS PCT: 1.5 %
HCT: 31.9 % — ABNORMAL LOW (ref 35.0–47.0)
HGB: 10.4 g/dL — ABNORMAL LOW (ref 12.0–16.0)
LYMPHS PCT: 33.4 %
Lymphocyte #: 2.4 10*3/uL (ref 1.0–3.6)
MCH: 29.2 pg (ref 26.0–34.0)
MCHC: 32.6 g/dL (ref 32.0–36.0)
MCV: 90 fL (ref 80–100)
Monocyte #: 0.5 x10 3/mm (ref 0.2–0.9)
Monocyte %: 6.9 %
NEUTROS ABS: 4.2 10*3/uL (ref 1.4–6.5)
Neutrophil %: 57.5 %
PLATELETS: 682 10*3/uL — AB (ref 150–440)
RBC: 3.56 10*6/uL — ABNORMAL LOW (ref 3.80–5.20)
RDW: 15 % — AB (ref 11.5–14.5)
WBC: 7.2 10*3/uL (ref 3.6–11.0)

## 2013-12-10 LAB — URINALYSIS, COMPLETE
BLOOD: NEGATIVE
Bilirubin,UR: NEGATIVE
GLUCOSE, UR: NEGATIVE mg/dL (ref 0–75)
Ketone: NEGATIVE
Leukocyte Esterase: NEGATIVE
NITRITE: NEGATIVE
Ph: 8 (ref 4.5–8.0)
Protein: 30
RBC,UR: NONE SEEN /HPF (ref 0–5)
SPECIFIC GRAVITY: 1.015 (ref 1.003–1.030)
WBC UR: NONE SEEN /HPF (ref 0–5)

## 2013-12-10 LAB — LIPASE, BLOOD: LIPASE: 107 U/L (ref 73–393)

## 2013-12-11 ENCOUNTER — Inpatient Hospital Stay: Payer: Self-pay | Admitting: Surgery

## 2013-12-11 LAB — CBC WITH DIFFERENTIAL/PLATELET
BASOS ABS: 0 10*3/uL (ref 0.0–0.1)
Basophil %: 0.6 %
EOS ABS: 0.1 10*3/uL (ref 0.0–0.7)
Eosinophil %: 2.7 %
HCT: 29.7 % — AB (ref 35.0–47.0)
HGB: 9.7 g/dL — AB (ref 12.0–16.0)
LYMPHS ABS: 2.3 10*3/uL (ref 1.0–3.6)
Lymphocyte %: 42.8 %
MCH: 28.9 pg (ref 26.0–34.0)
MCHC: 32.5 g/dL (ref 32.0–36.0)
MCV: 89 fL (ref 80–100)
MONO ABS: 0.4 x10 3/mm (ref 0.2–0.9)
Monocyte %: 7.2 %
Neutrophil #: 2.5 10*3/uL (ref 1.4–6.5)
Neutrophil %: 46.7 %
PLATELETS: 576 10*3/uL — AB (ref 150–440)
RBC: 3.34 10*6/uL — AB (ref 3.80–5.20)
RDW: 14.7 % — ABNORMAL HIGH (ref 11.5–14.5)
WBC: 5.3 10*3/uL (ref 3.6–11.0)

## 2013-12-11 LAB — BASIC METABOLIC PANEL
Anion Gap: 6 — ABNORMAL LOW (ref 7–16)
BUN: 3 mg/dL — ABNORMAL LOW (ref 7–18)
CALCIUM: 8.7 mg/dL (ref 8.5–10.1)
CHLORIDE: 103 mmol/L (ref 98–107)
CO2: 28 mmol/L (ref 21–32)
Creatinine: 0.86 mg/dL (ref 0.60–1.30)
Glucose: 106 mg/dL — ABNORMAL HIGH (ref 65–99)
Osmolality: 271 (ref 275–301)
POTASSIUM: 3.5 mmol/L (ref 3.5–5.1)
Sodium: 137 mmol/L (ref 136–145)

## 2013-12-15 LAB — CULTURE, BLOOD (SINGLE)

## 2014-01-01 ENCOUNTER — Ambulatory Visit: Payer: Self-pay | Admitting: Pain Medicine

## 2014-01-31 ENCOUNTER — Ambulatory Visit: Payer: Self-pay | Admitting: Pain Medicine

## 2014-02-06 ENCOUNTER — Emergency Department: Payer: Self-pay | Admitting: Emergency Medicine

## 2014-02-27 ENCOUNTER — Ambulatory Visit: Payer: Self-pay | Admitting: Pain Medicine

## 2014-03-04 DIAGNOSIS — G8929 Other chronic pain: Secondary | ICD-10-CM | POA: Insufficient documentation

## 2014-03-04 DIAGNOSIS — M25562 Pain in left knee: Secondary | ICD-10-CM

## 2014-03-04 DIAGNOSIS — M25561 Pain in right knee: Secondary | ICD-10-CM

## 2014-03-20 ENCOUNTER — Telehealth: Payer: Self-pay

## 2014-03-20 NOTE — Telephone Encounter (Signed)
Opened in error

## 2014-03-27 ENCOUNTER — Ambulatory Visit: Payer: Self-pay | Admitting: Pain Medicine

## 2014-04-04 ENCOUNTER — Emergency Department: Payer: Self-pay | Admitting: Emergency Medicine

## 2014-04-04 LAB — COMPREHENSIVE METABOLIC PANEL
ALBUMIN: 3.7 g/dL (ref 3.4–5.0)
Alkaline Phosphatase: 67 U/L
Anion Gap: 10 (ref 7–16)
BILIRUBIN TOTAL: 0.4 mg/dL (ref 0.2–1.0)
BUN: 7 mg/dL (ref 7–18)
CO2: 29 mmol/L (ref 21–32)
CREATININE: 0.98 mg/dL (ref 0.60–1.30)
Calcium, Total: 8.7 mg/dL (ref 8.5–10.1)
Chloride: 103 mmol/L (ref 98–107)
EGFR (Non-African Amer.): >60
Glucose: 108 mg/dL — ABNORMAL HIGH (ref 65–99)
Osmolality: 282 (ref 275–301)
Potassium: 3.7 mmol/L (ref 3.5–5.1)
SGOT(AST): 21 U/L (ref 15–37)
SGPT (ALT): 25 U/L
Sodium: 142 mmol/L (ref 136–145)
Total Protein: 8.5 g/dL — ABNORMAL HIGH (ref 6.4–8.2)

## 2014-04-04 LAB — CBC WITH DIFFERENTIAL/PLATELET
Basophil #: 0.1 10*3/uL (ref 0.0–0.1)
Basophil %: 0.6 %
Eosinophil #: 0 10*3/uL (ref 0.0–0.7)
Eosinophil %: 0.2 %
HCT: 40.2 % (ref 35.0–47.0)
HGB: 12.6 g/dL (ref 12.0–16.0)
Lymphocyte #: 2.8 10*3/uL (ref 1.0–3.6)
Lymphocyte %: 32.7 %
MCH: 25.6 pg — AB (ref 26.0–34.0)
MCHC: 31.5 g/dL — ABNORMAL LOW (ref 32.0–36.0)
MCV: 81 fL (ref 80–100)
MONOS PCT: 4.7 %
Monocyte #: 0.4 x10 3/mm (ref 0.2–0.9)
Neutrophil #: 5.3 10*3/uL (ref 1.4–6.5)
Neutrophil %: 61.8 %
Platelet: 411 10*3/uL (ref 150–440)
RBC: 4.95 10*6/uL (ref 3.80–5.20)
RDW: 19 % — ABNORMAL HIGH (ref 11.5–14.5)
WBC: 8.5 10*3/uL (ref 3.6–11.0)

## 2014-04-04 LAB — TROPONIN I: Troponin-I: <0.02 ng/mL

## 2014-04-04 LAB — LIPASE, BLOOD: LIPASE: 58 U/L — AB (ref 73–393)

## 2014-04-05 ENCOUNTER — Emergency Department: Payer: Self-pay | Admitting: Emergency Medicine

## 2014-04-25 ENCOUNTER — Ambulatory Visit: Payer: Self-pay | Admitting: Pain Medicine

## 2014-04-30 DIAGNOSIS — K432 Incisional hernia without obstruction or gangrene: Secondary | ICD-10-CM | POA: Insufficient documentation

## 2014-05-23 ENCOUNTER — Ambulatory Visit: Payer: Self-pay | Admitting: Pain Medicine

## 2014-07-04 ENCOUNTER — Ambulatory Visit: Payer: Self-pay | Admitting: Pain Medicine

## 2014-07-31 ENCOUNTER — Ambulatory Visit: Payer: Self-pay | Admitting: Pain Medicine

## 2014-08-23 ENCOUNTER — Emergency Department: Payer: Self-pay | Admitting: Emergency Medicine

## 2014-08-29 ENCOUNTER — Ambulatory Visit: Payer: Self-pay | Admitting: Pain Medicine

## 2014-09-20 DIAGNOSIS — M549 Dorsalgia, unspecified: Secondary | ICD-10-CM

## 2014-09-20 DIAGNOSIS — G8929 Other chronic pain: Secondary | ICD-10-CM | POA: Insufficient documentation

## 2014-09-20 DIAGNOSIS — L02211 Cutaneous abscess of abdominal wall: Secondary | ICD-10-CM | POA: Insufficient documentation

## 2014-10-01 ENCOUNTER — Ambulatory Visit
Admit: 2014-10-01 | Disposition: A | Discharge status: Home / Self Care | Payer: Self-pay | Attending: Pain Medicine | Admitting: Pain Medicine

## 2014-10-04 NOTE — H&P (Signed)
PATIENT NAME:  Daisy Daisy [name], Daisy Daisy [name] MR#:  [id number] DATE OF BIRTH:  [birth year]  DATE OF ADMISSION:  07/15/2012  PRIMARY CARE PHYSICIAN: Dr. Sampson Daisy [name]   ED REFERRING DOCTOR: Dr. Si Raiderhristine Daisy [name].   CHIEF COMPLAINT: Abdominal pain.   HISTORY OF PRESENT ILLNESS: The patient is Daisy [name] 49 year old African American female with history of hypertension, hyperlipidemia, bipolar disorder, diabetes, degenerative disk disease with chronic pain syndrome with multiple admissions for abdominal pain. Last admission was from 06/13/2012 to 06/17/2012 for epigastric pain. The etiology of this was unclear. It was felt that this could have been related to her cough. She has had endoscopy in 09/2011 which did not show any significant pathology. The patient reports that she chronically has sharp epigastric pain; however, it over the past few days it is all over her abdomen. She has had some nausea and has had emesis. She also complains of some mild diarrhea. She otherwise denies any hematemesis or hematochezia. She denies any urinary frequency, urgency, or hesitancy. She does report cough and has complaints of subjective fevers. And has chronic abdominal pain.   PAST MEDICAL HISTORY:  1. Daisy [name] history of hypertension.  2. Hyperlipidemia.  3. Bipolar disorder.  4. Diabetes since 2009.  5. Degenerative disk disease with chronic pain syndrome.  6. Daisy [name] history of MRSA pneumonia.  7. Daisy [name] history of multiple knee surgeries.  8. Status post cholecystectomy.  9. Status post hysterectomy.  10. GERD.  11. Migraines.  12. Daisy [name] history of insomnia according to the patient.  13. Anxiety.   ALLERGIES: ACCORDING TO HER, SHE IS ALLERGIC TO ANTI-INFLAMMATORIES, MORPHINE, CODEINE, SULFA, DARVOCET. SHE IS ABLE TO TAKE TYLENOL AND DILAUDID.   CURRENT MEDICATIONS: Prozac 20 mg daily. She is on lisinopril 10 daily, Ambien 10 mg at bedtime p.r.n., metformin 500 p.o. daily, Toprol-XL 25 p.o. daily, Xanax 1 mg, she takes it 5 times Daisy [name] day. Nexium 40 p.o.  b.i.d., Topamax 100 p.o. daily, which she reports she uses as needed. She used to be on Lasix, which she reports that she ran out of. Endocet 10/325, 1 to 3 tabs 5 times Daisy [name] day as needed, Zanaflex 4 mg, 1 to 1-1/2 tabs 4 times daily.   FAMILY HISTORY: Positive for diabetes.   SOCIAL HISTORY: She does not drink. No alcohol. No drugs.   REVIEW OF SYSTEMS: CONSTITUTIONAL: Complains of subjective fevers, fatigueness, weakness, chronic abdominal pain, back pain. No weight loss, no weight gain.  EYES: No blurred or double vision. No redness. No inflammation. No glaucoma.  ENT: No tinnitus. No ear pain. No hearing loss. No seasonal or year-round allergies. No epistaxis. No difficulty swallowing.  RESPIRATORY: Denies any cough, wheezing. No hemoptysis. No dyspnea.  CARDIOVASCULAR: No chest pain. No orthopnea. No edema. No arrhythmia.  GASTROINTESTINAL: Complains of nausea and vomiting, does complain of mild diarrhea. Complains of chronic abdominal pain. No hematemesis. No melena. Does report Daisy [name] history of GERD. No IBS. Denies any jaundice. No changes in bowel habits.  GENITOURINARY: Denies any dysuria, hematuria, renal calculus, or frequency.  ENDOCRINOLOGY: Denies any polydipsia, nocturia, or thyroid problems. Denies any heat or cold intolerance.  HEMATOLOGIC AND LYMPHATIC: Denies any anemia, easy bruisability, or bleeding.  SKIN: No acne. No rash. No changes in mole, hair or skin.  MUSCULOSKELETAL: Has chronic back pain.  NEUROLOGICAL: No numbness. No CVA. No TIA, no seizures.  PSYCHIATRIC: Has anxiety, bipolar disorder.   PHYSICAL EXAMINATION:  VITAL SIGNS: Temperature 99.3, pulse 64, respirations 20, blood pressure 156/89, O2 97% on room air.  GENERAL: The patient is an obese [place] American female currently not in any acute distress.  HEENT: Head atraumatic, normocephalic. Pupils equally round, reactive to light and accommodation. There is no conjunctival pallor. No scleral icterus. Nasal exam  shows no drainage or ulceration.  OROPHARYNX: Clear without any exudate.  NECK: No thyromegaly. No carotid bruits.  CARDIOVASCULAR: Regular rate and rhythm. No murmurs, rubs, clicks, or gallops. PMI is not displaced.  LUNGS: Clear to auscultation bilaterally without any rales, rhonchi, or wheezing.  ABDOMEN: Soft, nondistended. Positive bowel sounds x 4. She does have some epigastric tenderness without any guarding. No rebound. No hepatosplenomegaly.  EXTREMITIES: No clubbing, cyanosis, or edema.  SKIN: No rash.  LYMPHATICS: No lymph nodes palpable.  VASCULAR: Good DP, PT pulses.  PSYCHIATRIC: Not anxious or depressed.  NEUROLOGICAL: Awake, alert, oriented x 3. No focal deficits.   LABORATORY, DIAGNOSTIC, AND RADIOLOGICAL DATA:  URINALYSIS: Nitrites negative, leukocytes negative.  Chest x-ray shows mild bilateral interstitial thickening similar to prior examination.  BMP: Glucose 127, BUN 9, creatinine 0.97, sodium 136, potassium is 3.0, chloride is 106, CO2 is 21, calcium 9.0, AST 16, ALT is 13, and WBC count 11.2, hemoglobin 14.5, platelet count 333, lipase 133. TSH 1.0.  Abdominal x-rays, 3-view, no acute intra-abdominal abnormality.   ASSESSMENT AND PLAN: The patient is Daisy [name] 49 year old with recurrent epigastric pain of ongoing for the past one year, was hospitalized in January with similar symptoms, who presents back with worsening diffuse pain.  1. Abdominal pain, unclear cause, recurrent. The patient may benefit from gastric emptying study if not done, rule out. Gastroenterology to see. We will control her pain, give her IV fluids.  2. Hypokalemia. We will replace. Unclear cause for loss, possibly due to emesis and diarrhea.  3. Diabetes. We will place her on sliding scale insulin for now, hold metformin.  4. Hypertension. Continue lisinopril.        5. Chronic back pain. Continue her Zanaflex.  6. Miscellaneous. We will place her on Lovenox for DVT prophylaxis.  TIME SPENT: 35  minutes.  ____________________________ Daisy Scotts Allena Katz, MD shp:jm D: 07/15/2012 14:20:33 ET T: 07/15/2012 16:20:37 ET JOB#: [id number]  cc: Aarush Stukey H. Allena Katz, MD, <Dictator> Charise Carwin MD ELECTRONICALLY SIGNED 07/16/2012 16:32

## 2014-10-04 NOTE — H&P (Signed)
PATIENT NAME:  Daisy Daisy [name], Daisy Daisy [name] MR#:  [id number] DATE OF BIRTH:  [birth year]  DATE OF ADMISSION:  06/13/2012  CHIEF COMPLAINT: Abdominal pain and cough.   HISTORY OF PRESENT ILLNESS: Daisy [name] 49 year old female with Daisy [name] history of hypertension, hyperlipidemia, bipolar disorder, diabetes, degenerative disk disease with chronic pain syndrome, with several admissions over the last 1 year for onset of abdominal pain, the etiology has been unclear. She has been evaluated by surgery and GI in the past. At 1 point, there was Daisy [name] question of appendicitis, however, surgery did not feel this was the case, back in April 2013. She had has undergone endoscopy at that time with upper GI not revealing any particular abnormalities. Colonoscopy last performed 2009. She presented to the office and acute care yesterday with complaints of cough and malaise for 2 to 3 days. She was placed on Z-Pak. She presented today with the onset of abdominal discomfort, epigastrium, associated with nausea, vomiting. She had 1 episode of loose stool, no blood in this. She presented to the Emergency Room as she continued to have worsening pain with Daisy [name] sensation that she really could not keep any food down. She was evaluated in fast track there, ultimately went for CT scan after labs were unrevealing, and was found to have what looks like Daisy [name] short segment of potential colitis in the descending colon, as well as what were supposedly right middle lobe and left lower lobe infiltrates, possible pneumonia on CT of the abdomen and pelvis. Labs show no evidence of elevation of the white blood cell count. She has felt some low-grade temperatures, temperature is 99 here. Her oxygen saturation is 100% on room air. Her main complaint is epigastric discomfort and nausea. She is admitted now with abdominal pain, etiology unclear, with refractory nausea. Question of pneumonia or infiltrate, question of colitis on CT.   PAST MEDICAL HISTORY: 1.  Hypertension.  2.   Hyperlipidemia.  3.  Bipolar disorder, followed by Dr. Raynald KempHsu.  4.  Diabetes mellitus.  5.  Degenerative disk disease with chronic pain syndrome, followed by Dr. Metta Clinesrisp.  6.  History of MRSA pneumonia by old chart, timing unclear.  7.  History of multiple knee surgeries.  8.  Status post cholecystectomy.  9.  Status post hysterectomy.   ALLERGIES: ANTI-INFLAMMATORIES, MORPHINE, CODEINE, SULFA, DARVOCET. THE PATIENT TOLERATES TYLENOL AND DILAUDID.   MEDICATIONS: 1.  Prozac 20 mg daily.  2.  Lotensin/HCTZ 10/12.5 mg 1 p.o. daily.  3.  Ambien 10 mg at bedtime as needed.  4.  Metformin 500 mg p.o. daily.  5.  Toprol-XL 25 mg p.o. daily.  6.  Xanax 1 mg which patient states she takes 5 times Daisy [name] day.  7.  Omeprazole 20 mg p.o. b.i.d.  8.  Topamax 100 mg p.o. daily.   SOCIAL HISTORY: No tobacco reported. Rare alcohol.   FAMILY HISTORY: Diabetes.   REVIEW OF SYSTEMS: Please see HPI. Complains of generalized discomfort. Some cough, some pleuritic pain, but nonproductive. No significant fevers or chills. No urinary changes. No edema. No rash. Remainder of complete review of systems is negative.   PHYSICAL EXAMINATION: VITAL SIGNS: Temperature 99, pulse 64, blood pressure 191/101, respirations initially 24, weight 125 kg, saturation 100% on room air.  GENERAL: Obese female, appears uncomfortable.  EYES: Pupils round and reactive to light. Lids and conjunctiva unremarkable.  EARS, NOSE, AND THROAT: External examination unremarkable. Oropharynx is moist without lesions.  NECK: Supple. Trachea midline. No thyromegaly.  CARDIOVASCULAR:  Regular rate and rhythm without  murmurs, gallops or rubs. Carotid and radial pulses 2+.  LUNGS: Few crackles are heard without significant wheeze or retractions.  ABDOMEN: Slight distention, soft, with significant tenderness in the epigastrium with some anticipatory guarding. No pain in the lower quadrants. No mass palpable.  SKIN: No significant rashes or nodules.   LYMPH NODES: No cervical or supraclavicular nodes.  MUSCULOSKELETAL: No clubbing or cyanosis. No significant edema.   LABORATORY, DIAGNOSTIC, AND RADIOLOGICAL DATA: Urinalysis showing 1+ blood but 2 red cells per high-power field. Chest x-ray with question of heart failure versus pneumonia. CT of the abdomen and pelvis revealing the patient is status post cholecystectomy. Hemangioma in the liver. Pancreas, spleen unremarkable. Mild thickening of the descending colon distally, possible colitis, but without surrounding inflammation or air. Cystic areas in an enlarged left ovary. Diverticulosis without diverticulitis. Patchy nodular densities right middle lobe and left lower lobe, possible early infiltrate. Possible atelectasis versus fibrosis also described. Of note, the patient with CT scan in April which did not describe lung findings. Glucose 119, BUN 7, creatinine 1.01, potassium 3.4. Protein mildly elevated 0.9. Lipase normal. White count 10.8, hemoglobin 14, platelets 372.   IMPRESSION AND PLAN:   1.  Abdominal pain. N.p.o. for now, pain medication with hydromorphone which she has tolerated the past. Nausea medications.  IV Protonix. Gastroenterology consultation. Area of colitis described on CT scan does not correlate with the area of her abdominal discomfort. Empiric Flagyl for now.  2.  Possible pneumonia. No significant white count, no significant fever, saturation appears to be normal. Unclear whether this could be edema, atelectasis, or whether it is truly infiltrate. Possibility of aspiration with the vomiting can be raised. Does have reported history of methicillin-resistant Staphylococcus aureus pneumonia, however, due to the fact that her overall illness does not appear to be severe as one would expect with this, I think we can hold on treating for this. I will ask infectious disease consult to see the patient for further, assessment. Monitor chest x-ray. Follow abdominal exam.  3.  Diabetes  mellitus. Cover with sliding scale insulin.  4.  Hypertension. Increase ACE inhibitor to b.i.d. Will get echo to evaluate cardiac function and see whether this could be pulmonary edema. BNP is pending at this time. Add low-dose nitro paste for now.  5.  History of bipolar disorder. Continue on Prozac. Hold Topamax for now till we make sure she can keep things down. Continue Xanax but would change this to 4 times Daisy [name] day for now, watching for sedation, particularly with use of pain medication.    ____________________________ Lynnea Ferrier, MD bjk:cs D: 06/13/2012 17:59:02 ET T: 06/13/2012 19:02:19 ET JOB#: [id number]  cc: Lynnea Ferrier, MD, <Dictator> Daniel Nones MD ELECTRONICALLY SIGNED 06/23/2012 12:55

## 2014-10-04 NOTE — Consult Note (Signed)
Chief Complaint:   Subjective/Chief Complaint Seen for n/v epigastric pain, continued nausea and epigastric pain today.  no emesis.   VITAL SIGNS/ANCILLARY NOTES: **Vital Signs.:   02-Feb-14 08:38   Temperature Temperature (F) 98.4   Celsius 36.8   Temperature Source oral   Pulse Pulse 69   Respirations Respirations 20   Systolic BP Systolic BP 093   Diastolic BP (mmHg) Diastolic BP (mmHg) 76   Mean BP 88   Pulse Ox % Pulse Ox % 95   Pulse Ox Activity Level  At rest   Oxygen Delivery Room Air/ 21 %   Brief Assessment:   Cardiac Regular    Respiratory clear BS    Gastrointestinal details normal Soft  No masses palpable  Bowel sounds normal  No rebound tenderness  mild generalized tenderness to palpation,  more obvious/appaarent in the epigastrum and somewhat into the ruq.   Lab Results: Routine Chem:  02-Feb-14 03:17    Magnesium, Serum  1.7 (1.8-2.4 THERAPEUTIC RANGE: 4-7 mg/dL TOXIC: > 10 mg/dL  -----------------------)   Glucose, Serum 83   BUN 8   Creatinine (comp) 0.84   Sodium, Serum 140   Potassium, Serum 3.5   Chloride, Serum  110   CO2, Serum 22   Calcium (Total), Serum  8.3   Anion Gap 8   Osmolality (calc) 277   eGFR (African American) >60   eGFR (Non-African American) >60 (eGFR values <74m/min/1.73 m2 may be an indication of chronic kidney disease (CKD). Calculated eGFR is useful in patients with stable renal function. The eGFR calculation will not be reliable in acutely ill patients when serum creatinine is changing rapidly. It is not useful in  patients on dialysis. The eGFR calculation may not be applicable to patients at the low and high extremes of body sizes, pregnant women, and vegetarians.)  Routine Hem:  02-Feb-14 03:17    WBC (CBC) 9.1   RBC (CBC) 4.38   Hemoglobin (CBC) 13.1   Hematocrit (CBC) 40.0   Platelet Count (CBC) 297   MCV 91   MCH 29.9   MCHC 32.8   RDW  14.8   Neutrophil % 37.9   Lymphocyte % 53.6   Monocyte % 6.4    Eosinophil % 1.0   Basophil % 1.1   Neutrophil # 3.4   Lymphocyte #  4.9   Monocyte # 0.6   Eosinophil # 0.1   Basophil # 0.1 (Result(s) reported on 16 Jul 2012 at 04:05AM.)   Assessment/Plan:  Assessment/Plan:   Assessment 1)nausea, vomiting epigastric pain.  recurrent.  concern for possible bile gastritis.    Plan 1) will do EGD tomorrow.  I have discussed the risks benefits and complicatiosn of egd to include not limited to bleeding infection perforation and sedation and she wishes to proceed.   Electronic Signatures: SLoistine Simas(MD)  (Signed 02-Feb-14 14:53)  Authored: Chief Complaint, VITAL SIGNS/ANCILLARY NOTES, Brief Assessment, Lab Results, Assessment/Plan   Last Updated: 02-Feb-14 14:53 by SLoistine Simas(MD)

## 2014-10-04 NOTE — Consult Note (Signed)
PATIENT NAME:  Daisy [name], Daisy [name] MR#:  [id number] DATE OF BIRTH:  [birth year]  DATE OF CONSULTATION:  06/14/2012  REFERRING PHYSICIAN:  Daniel NonesBert Klein, MD CONSULTING PHYSICIAN:  Marcina MillardAlexander Naylee Frankowski, MD  CHIEF COMPLAINT: Abdominal pain.   REASON FOR CONSULTATION: Consultation is requested for evaluation of elevated troponin.   HISTORY OF PRESENT ILLNESS: The patient is [name] 49 year old female with multiple cardiovascular risk factors who is admitted with abdominal pain with borderline elevated troponin. The patient has had [name] history of recurrent abdominal discomfort and has been followed by GI and surgery. She has undergone prior colonoscopy and upper endoscopy. She was seen as an outpatient yesterday and was started on [name] Z-Pak for presumptive diagnosis of bronchitis, but presented later to the Emergency Room with worsening abdominal discomfort and cough. Abdominal CT scan was performed which revealed right middle and left lower lobe infiltrates suggestive of possible pneumonia as well as [name] short segment of colitis. The patient does have [name] low level fever. Admission laboratories were notable for borderline elevated troponin of 0.60. EKG was nondiagnostic.   PAST MEDICAL HISTORY: 1. Hypertension.  2. Hyperlipidemia.  3. Diabetes.  4. Bipolar disorder, followed by Dr. Janeece RiggersSu. 5. History of MRSA pneumonia.   MEDICATIONS ON ADMISSION: 1. Lotensin/HCTZ 10/12.5 mg daily.  2. Toprol-XL 25 mg daily.  3. Prozac 20 mg daily.  4. Ambien 10 mg at bedtime p.r.n.  5. Metformin 500 mg daily. 6. Xanax 1 mg 5 times [name] day.  7. Omeprazole 20 mg daily.  8. Topamax 100 mg daily.   SOCIAL HISTORY: The patient denies tobacco or EtOH abuse.   FAMILY HISTORY: No immediate family history of coronary artery disease or myocardial infarction.   REVIEW OF SYSTEMS:  CONSTITUTIONAL: No fever or chills.   EYES: No blurry vision.   EARS: No hearing loss.   RESPIRATORY: No shortness of breath.   CARDIOVASCULAR: No chest  pain.   GASTROINTESTINAL: The patient does have abdominal pain with nausea  and vomiting. She denies diarrhea or constipation.   GENITOURINARY: No dysuria or hematuria.   ENDOCRINE: No polyuria or polydipsia.   HEMATOLOGICAL: No easy bruising or bleeding.   INTEGUMENTARY: No rash.   MUSCULOSKELETAL: No arthralgias or myalgias.   NEUROLOGICAL: No focal muscle weakness or numbness.   PSYCHOLOGICAL: No depression or anxiety. The patient does have bipolar disorder.   PHYSICAL EXAMINATION:  VITAL SIGNS: Blood pressure 101/68, pulse 64, respirations 18, temperature 98.6 and pulse oximetry 92%.   HEENT: Pupils are equal and reactive to light and accommodation.   NECK: Supple without thyromegaly.   PULMONARY: Lungs were clear.   CARDIOVASCULAR: Normal JVP. Normal PMI. Regular rate and rhythm. Normal S1, S2. No appreciable gallop, murmur or rub.   ABDOMEN: Soft and nontender. Pulses were intact bilaterally.   MUSCULOSKELETAL: Normal muscle tone.   NEUROLOGIC: The patient is alert and oriented x 3. Motor and sensory are both grossly intact.   IMPRESSION: This is [name] 49 year old female with multiple cardiovascular risk factors admitted with abdominal pain and bronchitis with borderline elevated troponin with atypical chest pain and nondiagnostic EKG.   RECOMMENDATIONS: 1. Continue present medications.  2. Would defer full dose anticoagulation.  3. Review 2-D echocardiogram.  4. Lexiscan sestamibi study in the [name].m.  ____________________________ Marcina MillardAlexander Bland Rudzinski, MD ap:sb D: 06/14/2012 11:24:44 ET T: 06/14/2012 11:32:45 ET JOB#: [id number]  cc: Marcina MillardAlexander Shelia Kingsberry, MD, <Dictator> Marcina MillardALEXANDER Merian Wroe MD ELECTRONICALLY SIGNED 06/30/2012 8:47

## 2014-10-04 NOTE — Consult Note (Signed)
Pt VSS, afebrile, tol regular diet, likely home tomorrow.  I will sign off, reconsult if things change and does not go home tomorrow.  Electronic Signatures: Scot JunElliott, Caryl Manas T (MD)  (Signed on 03-Jan-14 17:52)  Authored  Last Updated: 03-Jan-14 17:52 by Scot JunElliott, Landri Dorsainvil T (MD)

## 2014-10-04 NOTE — Consult Note (Signed)
PATIENT NAME:  Daisy [name], Daisy [name] MR#:  [id number] DATE OF BIRTH:  [birth year]  DATE OF CONSULTATION:  07/15/2012  REFERRING PHYSICIANS:  Shreyang H. Allena Katz, MD, and Stann Mainland. Sampson Goon, MD CONSULTING PHYSICIAN:  Christena Deem, MD  REASON FOR CONSULTATION:  Recurrent abdominal pain.   HISTORY OF PRESENT ILLNESS:  The patient is a 49 year old African American female who came to the Emergency Room today with complaint of recurrent epigastric right upper quadrant pain with nausea and vomiting. She was admitted for a similar presentation several weeks ago. She states that one of her children has been having problems with bronchitis at home and coughing. She began to do this about a week ago. Earlier today, she got into a coughing spree that then followed some emesis. She has however been having some intermittent nausea and vomiting since this past Wednesday. There has been some right upper quadrant discomfort. She went to the Emergency Room 2 days ago, was hydrated, given some antiemetics and sent home with antiemetic. This seemed to work well for the next day and a half, but then this morning did not work as well and she came to the ER. She has been having recurrent problems with nausea, vomiting and headache. She has a history of gastritis by EGD on 09/29/2011 that was Helicobacter pylori negative. She had previous evaluations in 2009, EGD and colonoscopy, showing some internal hemorrhoids as well as a hiatal hernia. She does have a history of gastroesophageal reflux for which she takes Nexium on a regular basis. She has a bowel movement daily, but needs to stay Colace to help this on a regular basis. She has had a cholecystectomy. She further has a history of chronic pain management/pain syndrome involving degenerative disk disease. She has had multiple admissions actually for abdominal pain of unclear etiology from December through January 2014.   PAST MEDICAL HISTORY:  Previous admission was in regards to  bronchitis, possible colitis and pneumonia. She has had 7 knee surgeries. She has not had a total knee replacement; however, she has been told she will have to wait a few more years for that. There is a history of peptic ulcer disease, remote. There is a history of hypertension, hyperlipidemia, bipolar disorder, non-insulin-dependent diabetes, history of MRSA pneumonia, cholecystectomy, hysterectomy, migraine headaches, history of insomnia and anxiety.   OUTPATIENT MEDICATIONS:  Alprazolam 1 mg up to 5 times a day, Ambien 10 mg once a day, docusate sodium 100 mg twice a day, Endocet 10/325 mg 1 tablet 3 to 5 times a day, fluoxetine 20 mg a day, lamotrigine 100 mg once a day, lisinopril 10 mg a day, metformin 500 mg once a day, metoprolol succinate 25 mg once a day, Nexium 40 mg daily, tizanidine 4 mg one half to 1 tablet 2 to 4 times a day, Topamax 100 mg a day, vitamin D3 at 1000 International Units daily.   ALLERGIES:  DARVOCET-N, MORPHINE, MOTRIN, OXYCODONE, SULFA DRUGS AND TYLENOL #3.   The patient states she is feeling a little bit better since she has come into the hospital.   PHYSICAL EXAMINATION: VITAL SIGNS: Temperature 99, pulse 60, respirations 18, blood pressure 157/86, pulse oximetry 100%.  GENERAL: She is a 49 year old African American female in no acute distress.  HEENT: Normocephalic, atraumatic. Eyes are anicteric. Nose: Septum midline. No lesions. Oropharynx: No lesions.  NECK: Supple. No JVD. No lymphadenopathy. No thyromegaly.  HEART: Regular rate and rhythm.  LUNGS: Clear.  ABDOMEN: Soft. There is some mild tenderness to palpation  in the epigastric region extending into the right upper quadrant. There are no masses, rebound or organomegaly. The patient grimaces when palpating even lightly across the abdomen in a voluntary guarding. There are no masses or rebound. Bowel sounds positive, normoactive.  RECTAL: Anorectal exam is deferred.  EXTREMITIES: No clubbing, cyanosis or  edema.  NEUROLOGICAL: Cranial nerves II through XII grossly intact. Muscle strength bilaterally equal and symmetric. DTRs bilaterally equal and symmetric.   DIAGNOSTIC DATA:  On admission today, she had glucose 127, BUN 9, creatinine 0.97, sodium 136, potassium 3.0, chloride 106 and bicarbonate 21. She had calcium of 9.0 and magnesium 1.8. Her lipase was 133. Liver panel is showing a total protein of 8.8, albumin 4.3, alkaline phosphatase 64, AST 13, ALT 16. Troponin I x 1 is less than 0.02. Her TSH was 1.00. Hemogram showing a white count of 11.2, H and H of 14.5/44.2, platelet count 333, MCV is 91. Urinalysis was normal. On July 13, 2012, she had a 3-way abdominal film first time she came to the Emergency Room with this showing no evidence of acute intraabdominal abnormality, mild increased perihilar lung markings suggesting possibly acute bronchitis versus atelectasis. She had a chest PA and lateral showing mild bilateral interstitial thickening, possibly interstitial pneumonitis secondary to infection or inflammation.   ASSESSMENT:  Epigastric and right upper quadrant pain in the setting of known history of diabetes, although this is not insulin-dependent. The patient has had a cholecystectomy and there is no evidence of possible retained stone. There evidently is a history of remote peptic ulcer disease and the patient does take antireflux medications on a regular basis. The patient does have a history of constipation and nonspecific gastritis on EGD approximately 10 months ago.   RECOMMENDATIONS:  With the patient's problems with nausea and vomiting, this could quite possibly be post cholecystectomy bile gastritis. Typically, this does not respond well to proton pump inhibitors on their own and often will require Carafate as well. Of note, the patient has been placed on a b.i.d. PPI on admission. I would recommend a repeat EGD on Monday. This will need to be done with anesthesia assistance due to  patient's chronic use of pain medications. Of secondary consideration would be possible gastric emptying or motility problems secondary to the pain medications that she has had. Continue current. We will follow with you. Planning for scope on Monday.    ____________________________ Christena DeemMartin U. Dashawn Golda, MD mus:si D: 07/15/2012 19:24:01 ET T: 07/16/2012 17:03:49 ET JOB#: [id number]  cc: Christena DeemMartin U. Kaicen Desena, MD, <Dictator> Christena DeemMARTIN U Vermon Grays MD ELECTRONICALLY SIGNED 07/21/2012 6:15

## 2014-10-04 NOTE — Consult Note (Signed)
Chief Complaint:   Subjective/Chief Complaint seen for n/v abd pain.  feeling soem better no n/v today, tolerating regular diet.   VITAL SIGNS/ANCILLARY NOTES: **Vital Signs.:   04-Feb-14 14:35   Vital Signs Type Routine   Temperature Temperature (F) 98.1   Celsius 36.7   Temperature Source Oral   Pulse Pulse 57   Respirations Respirations 18   Systolic BP Systolic BP 113   Diastolic BP (mmHg) Diastolic BP (mmHg) 79   Mean BP 90   Pulse Ox % Pulse Ox % 99   Pulse Ox Activity Level  At rest   Oxygen Delivery Room Air/ 21 %   Brief Assessment:   Cardiac Regular    Respiratory clear BS    Gastrointestinal details normal Soft  Nondistended  No masses palpable  Bowel sounds normal  No rebound tenderness  mild epigastric tenderness to palpation   Assessment/Plan:  Assessment/Plan:   Assessment 1) m/v epigastric pain-mild gastritis noted on egd.  biopsy pending.  likely mild bile gastritis. 2) diarrhea-ude-resolved.    Plan 1) continue carafate and bid ppi.  GI fu in 2 weeks.  awaiting  biopsies, can fu as outpatient.   Electronic Signatures: Barnetta ChapelSkulskie, Martin (MD)  (Signed 04-Feb-14 20:19)  Authored: Chief Complaint, VITAL SIGNS/ANCILLARY NOTES, Brief Assessment, Assessment/Plan   Last Updated: 04-Feb-14 20:19 by Barnetta ChapelSkulskie, Martin (MD)

## 2014-10-04 NOTE — Discharge Summary (Signed)
PATIENT NAME:  Daisy [name], Daisy [name] MR#:  [id number] DATE OF BIRTH:  [birth year]  DATE OF ADMISSION:  06/13/2012 DATE OF DISCHARGE:  06/17/2012  PRIMARY CARE PHYSICIAN: Clydie Braunavid Fitzgerald, MD.   CONSULTANTS:  1.  Marcina MillardAlexander Paraschos, MD, cardiology. 2.  Lurline DelShaukat Iftikhar, MD, gastroenterology.   DISCHARGE DIAGNOSES:  1.  Abdominal pain attributed to bronchitis/pneumonia.  2.  Bronchitis/pneumonia.  3.  Mildly depressed left ventricular ejection fraction on Myoview of 42%.  4.  Obesity.  5.  Hypertension.  6.  Hyperlipidemia.  7.  Bipolar disorder.  8.  Diabetes mellitus.   DISCHARGE MEDICATIONS:  1.  Levaquin 500 mg daily to complete [name] 10-day course of antibiotics.  2.  Lamotidine 100 mg daily.  3.  Metformin 500 mg daily.  4.  Alprazolam 1 mg up to 5 times daily as needed.  5.  Vitamin D 1000 units daily.  6.  Topamax 100 mg daily.  7.  Ambien 10 mg at bedtime.  8.  Nexium 40 mg daily.  9.  Tizanidine 4 mg 1/2 to 1 tab 2 to 4 times daily as needed.  10.  Fluoxetine 20 mg daily.  11.  Metoprolol succinate extended-release 25 mg daily.  12.  Lisinopril 10 mg daily.  13.  Furosemide 20 mg daily.  14.  Docusate 100 mg b.i.d. for constipation.   HISTORY OF PRESENT ILLNESS: This is [name] 49 year old female who presented with complaints of abdominal pain. She had had abdominal pain ongoing for about 1 year and had previously undergone [name] workup with surgery and gastroenterology. She had had several admissions for this abdominal pain. Workup previously had included upper and lower endoscopies. Two to 3 days prior to admission, she had presented to the office for cough and malaise and had been placed on Z-Pak. On admission, she continued to report abdominal discomfort with nausea and vomiting. She had had some loose stool, no bloody stool. In the ED, she underwent [name] CT scan which showed possible short segment of potential colitis in the descending colon, as well as evidence of right middle lobe and  left lower lobe infiltrates. Her white blood cell count was normal. She was afebrile. She was not hypoxic and O2 sat was 100% on room air.   HOSPITAL COURSE:  1. Abdominal pain - The patient was admitted and she was given p.r.n. antiemetics. She was started on IV Protonix. Although there was concern of possible pneumonia, clinically this did not fit with her course. She was initially not initiated on antibiotics. Gastroenterology was consulted. Gastroenterology felt that colitis was unlikely. They did recommend initiation of Levaquin and Flagyl to cover potential pulmonary infiltration as well as colitis. Her proton pump inhibitor was continued. With supportive care and initiation of antibiotics, she had gradual improvement. She was able to tolerate an advancement in her diet. Again, without intervention her abdominal pain did improve, although was mildly persistent even at the time of discharge. 2. Cough and pulmonary infiltrates - As her clinical course was not consistent with pneumonia, she was assesed for congestive heart failure. She was noted to have an elevated BNP of 4802 on 06/13/12. Echocardiogram was unremarkable. [name] Myoview of the heart was obtained, which was notable for mildly depressed ejection fraction of 42%. She had previously been taking benazepril/hydrochlorothiazide prior to admission. That medication was stopped and it was replaced with lisinopril and Lasix. For presumed bronchitis/pneumonia, she will be continued on Levaquin for [name] total of 10 days. She received 5 days of Flagyl during  her inpatient stay.  3. Diabetes - She does have long-standing diabetes and blood sugars were well controlled on insulin sliding scale only. Metformin was held due to exposure to CT scan, however, will be restarted on discharge.   DISCHARGE PLANNING:  1.  The patient should follow up with her primary care physician in 1 to 2 weeks.  2.  Stop benazepril/hydrochlorothiazide.  Time spent on discharge = 45  minutes.   ____________________________ [name]. Daisy Mola, MD ams:jm D: 06/17/2012 08:09:31 ET T: 06/17/2012 14:39:08 ET JOB#: [id number]  cc: [name]. Daisy Mola, MD, <Dictator> Stann Mainland. Sampson Goon, MD Caleen Jobs Elvia Aydin MD ELECTRONICALLY SIGNED 06/19/2012 16:43

## 2014-10-04 NOTE — Consult Note (Signed)
Chief Complaint:   Subjective/Chief Complaint seen for nausea and vomiting, abdominal pain.  continues with mild nausea, some diarrhea that started last night,  continues with abdominal pain, epigastric and right abdomen.   VITAL SIGNS/ANCILLARY NOTES: **Vital Signs.:   03-Feb-14 13:23   Vital Signs Type Routine   Temperature Temperature (F) 98.6   Celsius 37   Pulse Pulse 60   Respirations Respirations 18   Systolic BP Systolic BP 856   Diastolic BP (mmHg) Diastolic BP (mmHg) 77   Mean BP 97   Pulse Ox % Pulse Ox % 96   Pulse Ox Activity Level  At rest   Oxygen Delivery Room Air/ 21 %  *Intake and Output.:   03-Feb-14 06:56   Stool  running loose stool   Brief Assessment:   Cardiac Regular    Respiratory clear BS    Gastrointestinal details normal Soft  Nondistended  Bowel sounds normal  No rebound tenderness  tender epigastrun and right abdomen   Lab Results:  Routine Chem:  02-Feb-14 03:17    Magnesium, Serum  1.7 (1.8-2.4 THERAPEUTIC RANGE: 4-7 mg/dL TOXIC: > 10 mg/dL  -----------------------)   Glucose, Serum 83   BUN 8   Creatinine (comp) 0.84   Sodium, Serum 140   Potassium, Serum 3.5   Chloride, Serum  110   CO2, Serum 22   Calcium (Total), Serum  8.3   Anion Gap 8   Osmolality (calc) 277   eGFR (African American) >60   eGFR (Non-African American) >60 (eGFR values <85m/min/1.73 m2 may be an indication of chronic kidney disease (CKD). Calculated eGFR is useful in patients with stable renal function. The eGFR calculation will not be reliable in acutely ill patients when serum creatinine is changing rapidly. It is not useful in  patients on dialysis. The eGFR calculation may not be applicable to patients at the low and high extremes of body sizes, pregnant women, and vegetarians.)  Routine Hem:  02-Feb-14 03:17    WBC (CBC) 9.1   RBC (CBC) 4.38   Hemoglobin (CBC) 13.1   Hematocrit (CBC) 40.0   Platelet Count (CBC) 297   MCV 91   MCH 29.9   MCHC  32.8   RDW  14.8   Neutrophil % 37.9   Lymphocyte % 53.6   Monocyte % 6.4   Eosinophil % 1.0   Basophil % 1.1   Neutrophil # 3.4   Lymphocyte #  4.9   Monocyte # 0.6   Eosinophil # 0.1   Basophil # 0.1 (Result(s) reported on 16 Jul 2012 at 04:05AM.)   Assessment/Plan:  Assessment/Plan:   Assessment 1) abdominal pain, nausea adn vomiting-h/o chronic abdominal apin and chronic back pain.  NIDDM.  h.o CCY, possible bile gastritis.    Plan 1) egd today, I have discussed the risks benefits and complicatiosn of egd to include not limited to bleeding infection perforation and sedation and she wishes to proceed.   further recs to follow.   Electronic Signatures: SLoistine Simas(MD)  (Signed 03-Feb-14 14:37)  Authored: Chief Complaint, VITAL SIGNS/ANCILLARY NOTES, Brief Assessment, Lab Results, Assessment/Plan   Last Updated: 03-Feb-14 14:37 by SLoistine Simas(MD)

## 2014-10-04 NOTE — Consult Note (Signed)
Brief Consult Note: Diagnosis: Borerline elevated trop, no CP, multiple CV risk factors.   Patient was seen by consultant.   Consult note dictated.   Comments: REC  Agree with current therapy, defer full dose anticogulation, review echo, Lexi-sest in am.  Electronic Signatures: Marcina MillardParaschos, Jakayden Cancio (MD)  (Signed 01-Jan-14 11:24)  Authored: Brief Consult Note   Last Updated: 01-Jan-14 11:24 by Marcina MillardParaschos, Christhoper Busbee (MD)

## 2014-10-04 NOTE — Discharge Summary (Signed)
PATIENT NAME:  Daisy Daisy [name], Daisy Daisy [name] MR#:  [id number] DATE OF BIRTH:  02/22/1966  DATE OF ADMISSION:  07/16/2012 DATE OF DISCHARGE:  07/19/2012  PRIMARY CARE PHYSICIAN: Daisy Braunavid Thresa Dozier, MD.   CONSULTING: Gastroenterologist, Dr. Barnetta ChapelMartin Daisy [name].   DISCHARGE DIAGNOSES:  1. Abdominal pain from bile gastritis.  2. Hypertension.  3. Diabetes.  4. Bipolar disorder.  5. Hypokalemia.   OPERATIONS PERFORMED: EGD.   HISTORY OF PRESENT ILLNESS: Please see admission history and physical. Briefly, this is Daisy [name] 49 year old with recurrent epigastric pain going on for over 1 year.   HOSPITAL COURSE BY ISSUE:  1. Abdominal pain: This has been extensively worked up in the past. At this time, the patient underwent EGD without impressive findings. Biopsy was done. She was suspected to have bile gastritis. She was placed on Daisy [name] PPI as well as sucralfate, and she will follow up with GI for pathology results as an outpatient.  2. Hypokalemia: This was repleted.  3. Diabetes: Continued her on Daisy [name] sliding scale insulin. We are holding metformin, but this will be restarted as an outpatient.  4. Hypertension: She continues on lisinopril.  5. Chronic back pain: She remains on Zanaflex.  6. Bipolar: This is stable on her outpatient meds.   DISCHARGE MEDICATIONS:  1. Lamotrigine 100 mg once Daisy [name] day.  2. Alprazolam 1 mg tablet 5 times Daisy [name] day.  3. Vitamin D3 once Daisy [name] day.  4. Topamax 100 mg once Daisy [name] day.  5. Ambien 10 mg once Daisy [name] day.  6. Tizanidine 4 mg 4 times Daisy [name] day.  7. Lisinopril 10 mg once Daisy [name] day.   8. Fluoxetine 20 mg once Daisy [name] day.  9. Metoprolol 25 mg once Daisy [name] day.  10. Colace 100 mg twice Daisy [name] day.  11. Endocet 10/325 three to 5 times Daisy [name] day.  12. Metformin 500 mg once Daisy [name] day.  13. Sucralfate 1 tablet 1 g 4 times Daisy [name] day.  14. Pantoprazole 40 mg twice Daisy [name] day.   DISPOSITION:  Discharged to home without home health.    DISCHARGE DIET: Carbohydrate, controlled ADA, regular consistency diet.   DISCHARGE ACTIVITY: As tolerated.    HOSPITAL FOLLOWUP: The patient will follow up with Dr. Sampson Daisy [name] in 1 to 2 weeks and with gastroenterology in 1 to 2 weeks.   This discharge took 35 minutes.     ____________________________ Daisy Mainlandavid P. Daisy GoonFitzgerald, MD dpf:gb D: 08/02/2012 22:23:04 ET T: 08/03/2012 06:04:38 ET JOB#: [id number]  cc: Daisy Mainlandavid P. Daisy GoonFitzgerald, MD, <Dictator> Daisy Baruch Daisy GoonFITZGERALD MD ELECTRONICALLY SIGNED 08/16/2012 19:22

## 2014-10-04 NOTE — Consult Note (Signed)
Chief Complaint:   Subjective/Chief Complaint Feels better with less cough and abdominal pain. No vomiting. Wants to eat.   VITAL SIGNS/ANCILLARY NOTES: **Vital Signs.:   02-Jan-14 14:17   Vital Signs Type Routine   Temperature Temperature (F) 98.1   Celsius 36.7   Temperature Source Oral   Pulse Pulse 68   Respirations Respirations 18   Systolic BP Systolic BP 93   Diastolic BP (mmHg) Diastolic BP (mmHg) 64   Mean BP 73   Pulse Ox % Pulse Ox % 97   Pulse Ox Activity Level  At rest   Oxygen Delivery 2L   Brief Assessment:   Additional Physical Exam No distress. Abdomen is soft and non tender.   Lab Results: Cardiology:  02-Jan-14 11:59    Protocol Carlton Adam   Max Work Load 10   Total Exercise Time 61   Max Diastolic BP 60   Max Heart Rate 104   Max Predicted Heart Rate 176   Reason For Termination Target Heart Rate Achieved   ECG interpretation Confirmed by OVERREAD, NOT ([id number]), editor PEARSON, BARBARA ([id number]) on 06/15/2012 1:17:22 PM  Routine Chem:  02-Jan-14 04:03    Glucose, Serum 88   BUN 10   Creatinine (comp) 1.07   Sodium, Serum 138   Potassium, Serum  2.9   Chloride, Serum 98   CO2, Serum 30   Calcium (Total), Serum 8.7   Anion Gap 10   Osmolality (calc) 274   eGFR (African American) >60   eGFR (Non-African American) >60 (eGFR values <40m/min/1.73 m2 may be an indication of chronic kidney disease (CKD). Calculated eGFR is useful in patients with stable renal function. The eGFR calculation will not be reliable in acutely ill patients when serum creatinine is changing rapidly. It is not useful in  patients on dialysis. The eGFR calculation may not be applicable to patients at the low and high extremes of body sizes, pregnant women, and vegetarians.)  Routine Hem:  02-Jan-14 04:03    WBC (CBC) 8.1   RBC (CBC) 4.37   Hemoglobin (CBC) 13.0   Hematocrit (CBC) 39.7   Platelet Count (CBC) 300   MCV 91   MCH 29.7   MCHC 32.7   RDW 14.4   Neutrophil % 49.6    Lymphocyte % 41.0   Monocyte % 8.2   Eosinophil % 0.9   Basophil % 0.3   Neutrophil # 4.0   Lymphocyte # 3.3   Monocyte # 0.7   Eosinophil # 0.1   Basophil # 0.0 (Result(s) reported on 15 Jun 2012 at 05:17AM.)   Assessment/Plan:  Assessment/Plan:   Assessment Patient with productive cough followed by vomiting and abdominal pain. The abdominal pain is either related to pulmonary process or might be the result of Z pack as the vomiting and pain started after using Z pack. CT suggest ? colitis which is clinically unlikely. Patient is overall much better.    Plan Continue PPI. Will advance diet. Stress test result pending. EGD few months ago was negative except for gastritis. Dr. EVira Agarwill follow her over the weekend.   Electronic Signatures: IJill Side(MD)  (Signed 02-Jan-14 15:24)  Authored: Chief Complaint, VITAL SIGNS/ANCILLARY NOTES, Brief Assessment, Lab Results, Assessment/Plan   Last Updated: 02-Jan-14 15:24 by IJill Side(MD)

## 2014-10-04 NOTE — Consult Note (Signed)
Brief Consult Note: Comments: Patient with productive cough followed by vomiting and abdominal pain. The abdominal pain is either related to pulmonary process or might be the result of Z pack as the vomiting and pain started after using Z pack. CT suggest ? colitis which is clinically unlikely.  Recommendations: Continue Levaquin and Metronidazole. Protonix. Will follow.  Electronic Signatures: Lurline DelIftikhar, Jazzmon Prindle (MD)  (Signed 01-Jan-14 14:42)  Authored: Brief Consult Note   Last Updated: 01-Jan-14 14:42 by Lurline DelIftikhar, Keilyn Nadal (MD)

## 2014-10-04 NOTE — Consult Note (Signed)
PATIENT NAME:  Daisy [name], Daisy [name] MR#:  [id number] DATE OF BIRTH:  Dec 19, 1965  DATE OF CONSULTATION:  06/14/2012  CONSULTING PHYSICIAN:  Lurline DelShaukat Tyleigh Mahn, MD  REASON FOR CONSULTATION:  Abdominal pain.   HISTORY OF PRESENT ILLNESS:  The patient is [name] 49 year old female with history of hypertension, hyperlipidemia, diabetes, bipolar disorder, chronic pain syndrome and multiple admissions to the hospital with abdominal pain. The patient underwent an upper GI endoscopy in April 2013 which was normal except for gastritis. [name] colonoscopy was in 2003 and was negative except for diverticulosis. According to the patient, she started to have cough about [name] week ago. The cough was productive. She went to urgent care and received some Z-Pak. Shortly after starting the Z-Pak, the patient started to throw up and that was followed by upper abdominal pain mostly on the left side. The patient came to the Emergency Room and was subsequently admitted. The patient is still complaining of cough which is productive. Chest x-ray is concerning for pneumonia versus congestive heart failure. The patient also has [name] low-grade fever and generalized body aches. She is still complaining of upper abdominal discomfort which has spread throughout the upper abdomen. She has not had any further vomiting in the hospital. She denies any diarrhea or constipation. Denies using nonsteroidals.   PAST MEDICAL HISTORY:  Hypertension, hyperlipidemia, bipolar disorder, diabetes, degenerative disk disease, history of cholecystectomy, hysterectomy and multiple neck surgeries.   ALLERGIES:  ANTI-INFLAMMATORIES AND ACCORDING TO HER MORPHINE, CODEINE, SULFA, AND DARVOCET.   MEDICATIONS AT HOME:  Prozac, Lotensin/hydrochlorothiazide, Ambien, metformin, Toprol, Xanax, omeprazole and Topamax.   SOCIAL HISTORY:  Does not smoke or drink.   FAMILY HISTORY:  Positive for diabetes.   REVIEW OF SYSTEMS:  Negative except for what is mentioned in the history of  present illness.   PHYSICAL EXAMINATION: GENERAL: Obese female, does not appear to be in any acute distress.  VITAL SIGNS: Temperature 98.2, T-max was 99.4, pulse is in the 60s and 70s, respirations 18, blood pressure 109/71.  HEENT: Grossly unremarkable.  NECK: Veins are flat.  LUNGS: Examination showed bilateral crackles and few scattered wheezes, otherwise unremarkable.  CARDIOVASCULAR: Regular rate and rhythm.  ABDOMEN: Flat. Bowel sounds are positive and regular. Some tenderness was noted in the epigastric and upper abdominal area, although this is vague and nonspecific. No guarding or rebound was noted.  NEUROLOGIC: Examination appears to be unremarkable.   DIAGNOSTIC DATA:  White cell count was normal on admission and is 11.7 today. The rest of the CBC is unremarkable. Serum lipase is normal. Troponin is somewhat elevated at 0.6. Electrolytes: BUN and creatinine are fine. Potassium is somewhat low at 3. CT scan of the abdomen and pelvis showed [name] questionable short segment of colitis in the descending colon. Diverticula were noted in that area as well. the CT also showed patchy nodular densities in the right mid lobe and to [name] lesser extent in the left lower lobe which is concerning for pneumonia.   ASSESSMENT AND PLAN:  The patient is with what appears to be bronchitis/pneumonia as suggested by cough, low-grade fever and abnormal CT scan and chest x-ray. The patient started to use Z-Pak and that was followed by vomiting and then abdominal pain. The abdominal pain is most likely secondary to pulmonary process versus irritation of the gastric mucosa secondary to use of macrolides. The patient has [name] history of chronic abdominal pain, but the recent upper GI endoscopy was unremarkable and CT scan is otherwise unremarkable. The CT scan also  showed questionable colitis in the descending colon. She really does not have any other symptoms to suggest colitis, although there remains the possibility that  the thickening of the wall of the colon may be secondary to diverticulosis as well. I would recommend continuing her on Levaquin as well as Flagyl. Levaquin will cover both pulmonary infection as well as colitis if present and oral proton pump inhibitor for presumed gastric irritation. Further recommendations to follow depending on the patient's hospital course.    ____________________________ Lurline Del, MD si:si D: 06/14/2012 14:52:29 ET T: 06/14/2012 17:08:21 ET JOB#: [id number]  cc: Lurline Del, MD, <Dictator> Lurline Del MD ELECTRONICALLY SIGNED 06/21/2012 11:14

## 2014-10-04 NOTE — Consult Note (Signed)
Brief Consult Note: Diagnosis: epigastric pain nausea and vomiting.   Patient was seen by consultant.   Consult note dictated.   Recommend to proceed with surgery or procedure.   Comments: Please see full GI consult [phone]#[id number].  Patietn admitted with n/v and abdominal pain in the epigastrum and ruq.  History of CCY, on chronic narcotics for chronic pain syndrome, with concern for gastric emptying delay/gastric dysmotility from pain meds versus post CCY bile gastritis.  Recommend EGD-will arrange for monday pm.  Continue bid ppi as you are, following.  Electronic Signatures: Barnetta ChapelSkulskie, Martin (MD)  (Signed 01-Feb-14 19:27)  Authored: Brief Consult Note   Last Updated: 01-Feb-14 19:27 by Barnetta ChapelSkulskie, Martin (MD)

## 2014-10-05 NOTE — Discharge Summary (Signed)
PATIENT NAME:  Daisy GoslingOLIVER, Judaea A MR#:  [id number] DATE OF BIRTH:  March 16, 1966  DATE OF ADMISSION:  12/11/2013. DATE OF DISCHARGE:  12/12/2013.  DIAGNOSES:  Abdominal pain, wound infection, open wound, morbid obesity, leiomyoma of the appendix, hypertension and diabetes.   PROCEDURES: Wound VAC placement by RN.   HISTORY OF PRESENT ILLNESS AND HOSPITAL COURSE: This is a patient who came to the hospital because she was having some abdominal pain and some nausea and vomiting, was unable to keep her pain medications down.  A wound infection had noted after recent discharge from the hospital for excision of a leiomyoma of the appendix. That wound had been treated with normal saline wet-to-dry dressings in the Emergency Room but was clearly purulent on the dressings with individual 4 x 4 gauze placed deep in the wound. These were all removed. She was admitted to the hospital, where IV antiemetics controlled her nausea and she was given pain medication which controlled her abdominal pain and a wound VAC was placed. Once the wound VAC was placed it was clear that her pain was related to the wound itself. She was no longer vomiting and was tolerating a diet. She is discharged in stable condition with home health for wound care and wound VAC management and she will be seen in the office in 10 days.    ____________________________ Adah Salvageichard E. Excell Seltzerooper, MD rec:lt D: 12/13/2013 08:34:40 ET T: 12/13/2013 12:43:19 ET JOB#: [id number]  cc: Adah Salvageichard E. Excell Seltzerooper, MD, <Dictator> Lattie HawICHARD E Khrystina Bonnes MD ELECTRONICALLY SIGNED 12/13/2013 13:50

## 2014-10-05 NOTE — H&P (Signed)
History of Present Illness 11 days s/p R HC for benign leiomyoma of the appendix. 3 days ago her incision became more painful, and then it frained brownish red fluid today. No fever or chills.   Past Med/Surgical Hx:  Anxiety:   Depression:   chronic back pain:   Diabetes:   kidney stone:   Hypercholesterolemia:   gastritis:   GERD - Esophageal Reflux:   Ulcers, Gastric:   Hypertension:   Appendectomy:   Left knee surgery:   Cholecystectomy:   Hysterectomy - Total:   ALLERGIES:  Sulfa drugs: Unknown  Darvocet - N: Unknown  Tylenol #3: Unknown  Motrin: GI Distress  Morphine: GI Distress  Aleve: GI Distress  HOME MEDICATIONS: Medication Instructions Status  acetaminophen-oxyCODONE 325 mg-5 mg oral tablet 1 tab(s) orally every 4 hours, As needed, pain Active  Endocet 325 mg-10 mg oral tablet 2  orally every 4 hours prn pain Active  promethazine 25 mg tablet 1-2 tab(s) orally every 4-6 hours- as needed  Active  lisinopril 10 mg oral tablet 1 tab(s) orally once a day Active  Xanax 2 mg oral tablet 1 tab(s) orally 3 times a day Active  metFORMIN 500 mg oral tablet 1 tab(s) orally once a day, As Needed for high blood sugar Active  Protonix 40 mg oral delayed release tablet 1 tab(s) orally 2 times a day Active  zolpidem 12.5 mg oral tablet, extended release 1 tab(s) orally once a day (at bedtime) Active  metoprolol 50 mg oral tablet 1 tab(s) orally once a day Active  nortriptyline 25 mg oral capsule 1 cap(s) orally 2 times a day Active  Zanaflex 4 mg oral capsule 2 cap(s) orally 3 times a day Active   Family and Social History:  Family History Non-Contributory   Social History negative tobacco, negative ETOH, negative Illicit drugs   Place of Living Home   Review of Systems:  Fever/Chills Yes  No  99.9   Cough No   Sputum No   Abdominal Pain Yes   Diarrhea No   Constipation No   Nausea/Vomiting No   SOB/DOE No   Chest Pain No   Dysuria No   Tolerating  PT Yes   Tolerating Diet Yes   Medications/Allergies Reviewed Medications/Allergies reviewed   Physical Exam:  GEN well developed, well nourished, no acute distress, obese   HEENT pink conjunctivae, PERRL, hearing intact to voice, moist oral mucosa, Oropharynx clear   NECK supple   RESP normal resp effort  clear BS  no use of accessory muscles   CARD regular rate  no murmur  no thrills  no JVD  no Rub   ABD denies tenderness  RLQ transverse incision with mild surrounding blanching erythema   EXTR negative cyanosis/clubbing, negative edema   NEURO cranial nerves intact, negative tremor, follows commands, motor/sensory function intact   PSYCH alert, A+O to time, place, person   Lab Results: Hepatic:  23-Jun-15 11:29   Bilirubin, Total 0.4  Alkaline Phosphatase 96 (45-117 NOTE: New Reference Range 05/04/13)  SGPT (ALT) 50  SGOT (AST) 33  Total Protein, Serum 8.2  Albumin, Serum  3.3  Routine Chem:  23-Jun-15 11:29   Glucose, Serum 91  BUN  5  Creatinine (comp) 0.83  Sodium, Serum 138  Potassium, Serum  3.3  Chloride, Serum 103  CO2, Serum 27  Calcium (Total), Serum 9.4  Osmolality (calc) 273  eGFR (African American) >60  eGFR (Non-African American) >60 (eGFR values <74m/min/1.73 m2 may be an  indication of chronic kidney disease (CKD). Calculated eGFR is useful in patients with stable renal function. The eGFR calculation will not be reliable in acutely ill patients when serum creatinine is changing rapidly. It is not useful in  patients on dialysis. The eGFR calculation may not be applicable to patients at the low and high extremes of body sizes, pregnant women, and vegetarians.)  Anion Gap 8  Routine Hem:  23-Jun-15 11:29   WBC (CBC)  15.6  RBC (CBC)  3.67  Hemoglobin (CBC)  10.8  Hematocrit (CBC)  33.4  Platelet Count (CBC)  516 (Result(s) reported on 04 Dec 2013 at 11:42AM.)  MCV 91  MCH 29.3  MCHC 32.3  RDW  15.3    Assessment/Admission  Diagnosis Wound infection   Plan Open and drained and packed. Plan D/C tomorrow with [facility] to help with dressing changes. PO ABx and opioid analgesics.   Electronic Signatures: Consuela Mimes (MD)  (Signed 23-Jun-15 13:08)  Authored: CHIEF COMPLAINT and HISTORY, PAST MEDICAL/SURGIAL HISTORY, ALLERGIES, HOME MEDICATIONS, FAMILY AND SOCIAL HISTORY, REVIEW OF SYSTEMS, PHYSICAL EXAM, LABS, ASSESSMENT AND PLAN   Last Updated: 23-Jun-15 13:08 by Consuela Mimes (MD)

## 2014-10-05 NOTE — Discharge Summary (Signed)
PATIENT NAME:  Daisy [name], Daisy [name] MR#:  [id number] DATE OF BIRTH:  [birth year]  DATE OF ADMISSION:  09/12/2013 DATE OF DISCHARGE: 09/14/2013   DISCHARGE DIAGNOSES:  1.  Hypertensive urgency.  2.  Headache.   HISTORY OF PRESENT ILLNESS: Please see initial history and physical. Briefly, the patient was at her pain clinic about to get an occipital injection, which she has had before for severe headaches. Her blood pressure was noted to be in the 200s systolic. The patient was sent to the Emergency Room where she was admitted. Labs were normal.   HOSPITAL COURSE:  1.  For her intractable headaches, she was treated with Solu-Medrol and pain medications and this improved remarkably. She will likely benefit from the occipital nerve block as an outpatient.  2.  Hypertension. She was treated initially with some IV hydralazine, which improved her blood pressure. However, we continued her home meds and with adequate pain control, her blood pressure normalized.  3.  Diabetes. She was continued on her metformin and sliding scale.  4.  Chronic pain. She was continued on her outpatient pain regimen.   DISCHARGE MEDICATIONS: [facility]   DISCHARGE INSTRUCTIONS: The patient will follow up with Dr. Sampson GoonFitzgerald in 1 to 2 weeks and also schedule follow up with her pain clinic to try to get the occipital nerve block. She will call or return if she has any worsening symptoms.   DISCHARGE DIET: Low sodium, ADA, carbohydrate-controlled diet.   TIME SPENT: This discharge took 35 minutes.  ____________________________ Stann Mainlandavid P. Sampson GoonFitzgerald, MD dpf:aw D: 09/14/2013 10:14:22 ET T: 09/14/2013 11:31:23 ET JOB#: [id number]  cc: Stann Mainlandavid P. Sampson GoonFitzgerald, MD, <Dictator> DAVID Sampson GoonFITZGERALD MD ELECTRONICALLY SIGNED 09/23/2013 16:21

## 2014-10-05 NOTE — Consult Note (Signed)
See consult NP Fransico SettersKim Mills.  Discussed case briefly with Dr. Michela PitcherEly.  Pt with epigastric and RUQ pain with some raditation into right mid lower. Pain worse with eating within 5-10 min.  Some vomiting which briefly makes it better. It comes and goes, never completely.  Previous EGD showed inflammation. Plan EGD tomorrow morning.  Electronic Signatures: Scot JunElliott, Robert T (MD)  (Signed on 14-May-15 18:05)  Authored  Last Updated: 14-May-15 18:05 by Scot JunElliott, Robert T (MD)

## 2014-10-05 NOTE — H&P (Signed)
PATIENT NAME:  Daisy [name], Daisy [name] MR#:  [id number] DATE OF BIRTH:  29-May-1966  DATE OF ADMISSION:  09/12/2013  ADMITTING PHYSICIAN: Gladstone Lighter, MD  PRIMARY CARE PHYSICIAN: Daisy Marker. Ola Spurr, MD  CHIEF COMPLAINT: Headache and elevated blood pressure.   HISTORY OF PRESENT ILLNESS: Daisy [name] is a 49 year old African American female with past medical history significant for hypertension, hyperlipidemia, bipolar disorder, diabetes mellitus, chronic pain syndrome, chronic headaches with migraine, who follows at the pain clinic, comes to the hospital referred from the pain management clinic for elevated blood pressure. The patient says she had a fall secondary to low blood sugar about a month ago and since then she has hurt in multiple places and has been having worsening of her pain and also headache. She went to the pain clinic today for getting an occipital nerve block, which usually helps her migraine headache. However, over at the clinic her blood pressure was very elevated even though she took her morning medications for blood pressure so she was sent over to the ER. Her pain has been intractable and not controlled with IV medications here in the ER so is being admitted for the same.  Her blood pressure is better controlled now.   PAST MEDICAL HISTORY: 1.  Hypertension.  2.  Hyperlipidemia.  3.  Bipolar disorder.  4.  Non-insulin-dependent diabetes mellitus.  5.  Chronic pain syndrome.  6.  Chronic headaches.   PAST SURGICAL HISTORY: 1.  Multiple knee surgeries.    2.  Cholecystectomy.  3.  Hysterectomy.   ALLERGIES TO MEDICATIONS: ANTI-INFLAMMATORIES, MORPHINE, CODEINE, SULFA, DARVOCET, TYLENOL #3.   CURRENT HOME MEDICATIONS:  1. Endocet 10/325 one tablet q.4 to 6 hours p.r.n. for pain.  2. Lamictal 100 mg p.o. at bedtime.  3. Lisinopril 10 mg p.o. daily.  4. Metformin 500 mg p.o. daily.  5. Metoprolol 25 mg 2 tablets once a day.  6. Promethazine 25 mg q. 6 hours p.r.n. p.o.  for nausea, vomiting.  7. Protonix 40 mg p.o. b.i.d.  8. Topamax 100 mg p.o. at bedtime.  9. Xanax 2 mg 3 times a day.  10. Zanaflex 2 mg 1 to 2 tablets 2 to 3 times a day.   11. Zolpidem 2.5 mg at bedtime.   SOCIAL HISTORY: Lives at home with her mom. No smoking.  Occasional alcohol use.   FAMILY HISTORY: Diabetes and heart disease runs in the family.  Mom with stroke, dad with diabetes.   REVIEW OF SYSTEMS:    CONSTITUTIONAL: No fever. No fatigue or weakness.  EYES: No blurred vision, double vision, inflammation or glaucoma.  ENT: No tinnitus, ear pain, hearing loss, epistaxis or discharge.  RESPIRATORY: No cough, wheeze, hemoptysis or COPD.   CARDIOVASCULAR: No chest pain, orthopnea, edema, arrhythmia, palpitations or syncope. GASTROINTESTINAL:  Positive for nausea, vomiting. No diarrhea, abdominal pain, hematemesis or melena.  GENITOURINARY: No dysuria, hematochezia, renal calculus, frequency or incontinence.  ENDOCRINE: No polyuria, nocturia, thyroid problems, heat or cold intolerance.  HEMATOLOGY: No anemia, easy bruising or bleeding.   SKIN:  No acne, rash or lesions.  MUSCULOSKELETAL: Multiple aches in the left knee, left ankle, chronic back pain.  NEUROLOGIC: No CVA, TIA or seizures. Positive for intractable headaches.  PSYCHOLOGICALLY:  No anxiety, insomnia or depression.   PHYSICAL EXAMINATION: VITAL SIGNS: Temperature 98.4 degrees Fahrenheit, pulse 81, respirations 18, blood pressure 204/116, pulse ox is 100% on room air.  GENERAL: Well-built, well-nourished female lying in bed, not in any acute distress.  HEENT: Normocephalic,  atraumatic. Pupils equal, round, reacting to light. Anicteric sclerae. Extraocular movements intact. Oropharynx clear without erythema, mass or exudates.  NECK: Supple. No thyromegaly, JVD or carotid bruit.  No lymphadenopathy. Normal range of motion without any pain.  RESPIRATORY:  No wheeze or crackles. No use of accessory muscles for breathing,  normal breath sounds bilaterally.  CARDIOVASCULAR: S1, S2 regular rate and rhythm. No murmurs, rubs or gallops.  ABDOMEN: Soft, nontender, nondistended. No hepatosplenomegaly. Normal bowel sounds.  EXTREMITIES: No pedal edema. No clubbing or cyanosis. Mild ankle tenderness from recent sprain. Normal dorsalis pedis pulses palpable bilaterally.  SKIN: No acne, rash or lesions.  LYMPHATICS: There is no cervical or inguinal lymphadenopathy.  NEUROLOGIC:   Cranial nerves are intact. No focal motor or sensory deficits.  PSYCHOLOGICAL: The patient is awake, alert, oriented x 3.   LABORATORY, DIAGNOSTIC AND RADIOLOGICAL DATA:   1.  WBC 6.1, hemoglobin 12.6, hematocrit 38.9 and platelet count 295.  2.  Sodium 138, potassium 3.5, chloride 105, bicarb 29, BUN 6, creatinine 0.69, glucose 132 and calcium of 8.3.  3.  ALT 69, AST 16, alk phos 51, total bili 0.5, albumin of 3.3.  4.  CT of the head without contrast showing no acute intracranial process.   ASSESSMENT AND PLAN: This is an obese 49 year old female with history of chronic pain syndrome, chronic headaches, who follows at the pain management clinic. Hypertension, diabetes, comes with intractable headache causing hypertensive urgency.  1.  Intractable headaches secondary to migraines and chronic headaches. Start IV Solu-Medrol for 3 days, IV pain medication. Continue home medications.  Will need outpatient followup with pain management clinic for occipital nerve block.   2.  Hypertension. Accelerated hypertension, was elevated earlier, improved now.  Continue home medicines and IV hydralazine p.r.n.  3.  Diabetes mellitus.  Continue metformin and also sliding scale insulin.   4.  Chronic pain syndrome. Continue her home pain medications.   CODE STATUS: FULL CODE.  TIME SPENT: Time spent on admission was 50 minutes.   ____________________________ Gladstone Lighter, MD rk:cs D: 09/12/2013 15:28:00 ET T: 09/12/2013 15:47:35  ET JOB#: [id number]  cc: Daisy Marker. Ola Spurr, MD Gladstone Lighter, MD, <Dictator>   Gladstone Lighter MD ELECTRONICALLY SIGNED 09/20/2013 14:05

## 2014-10-05 NOTE — H&P (Signed)
PATIENT NAME:  Daisy [name], Daisy [name] MR#:  [id number] DATE OF BIRTH:  [birth year]  DATE OF ADMISSION:  10/24/2013  ATTENDING PHYSICIAN: Daisy Deer A. Lavar Rosenzweig, MD  REASON FOR ADMISSION: Epigastric pain, nausea, vomiting.   Daisy [name] is a pleasant 49 year old female with multiple medical issues including diabetes, recurrent gastritis, and anxiety who presented with one day of epigastric pain, nausea, vomiting. She says that her pain began acutely this morning and became worse, as well as her nausea and vomiting. However, she did say that she felt poorly throughout the weekend. She reports her pain is epigastric and radiating along the right side of her back. Has had similar pain before when she had been diagnosed with gastritis, and in particular bile reflux gastritis, and healing mucosa on a EGD in 2014 by Daisy [name]. At that time, she was treated with PPI and Carafate and improved, although she does not think Carafate had helped her. Has also had some sort of medicine from the GI doctors before to help with this. Has also a history of back, neck, and headaches, for which she receives nerve blocks with some success. Says she was feeling fine before this. In addition, was admitted in 2013 for similar pain and was noted to have thickening of her distal appendix, which appears to be more prominent on today's CT and concerning for neoplasm. Otherwise, no fevers, chills, night sweats, shortness of breath, cough, chest pain. Has had one episode of diarrhea in the ED. No dysuria, hematuria, paresthesias, dysesthesias.   PAST MEDICAL HISTORY: 1.  Gastritis.  2.  Hypertension.  3.  History of chronic back, neck pain, and headaches.  4.  Diabetes mellitus.  5.  History of kidney stones.  6.  History of hypercholesterolemia.  7.  Anxiety.  8.  Depression.  9.  History of cholecystectomy in 2002.  10.  History of total hysterectomy for chronic pelvic pain.  11.  History of left knee surgery.   HOME  MEDICATIONS:  1.  Ambien 12.5 p.o. daily. 2.  Zanaflex 2 mg 1 to 2 p.o. 2 to 3 times daily.  3.  Xanax 2 mg t.i.d.  4.  Protonix 40 mg p.o. b.i.d.  5.  Phenergan 25 p.o. q.6 hours p.r.n. nausea, vomiting.  6.  Nortriptyline 25 to 50 orally daily.  7.  Metoprolol 25 mg p.o. b.i.d.  8.  Metformin 500 mg p.o. daily.  9.  Lisinopril 10 mg p.o. daily.  10.  Lamictal 100 mg p.o. b.i.d.  11.  Endocet 10/325 4 to 7 pills p.o. daily.   ALLERGIES:  1.  MOTRIN.  2.  MORPHINE.  3.  SULFA. 4.  DARVOCET.  5.  TYLENOL NO. 3.   SOCIAL HISTORY: Denies alcohol or tobacco use.   FAMILY HISTORY: Positive for diabetes.   REVIEW OF SYSTEMS: Twelve-point review of systems was obtained. Pertinent positives and negatives as above.   PHYSICAL EXAMINATION: VITAL SIGNS: Temperature 98.3, pulse 74, blood pressure 170/92, respirations 18.  GENERAL: No acute distress, appears uncomfortable.  HEAD: Normocephalic, atraumatic.  EYES: No scleral icterus. No conjunctivitis.  FACE: No obvious facial trauma. Normal external nose. Normal external ears.  CHEST: Lungs clear to auscultation. Moving air well.  HEART: Regular rate and rhythm. No murmurs, rubs, or gallops.  ABDOMEN: Soft, obese. Tender to palpation in epigastrium and right upper quadrant, but not peritoneal.  EXTREMITIES: Moves all extremities well. Strength 5/5.  NEUROLOGIC: Cranial nerves II through XII grossly intact.   LABORATORY AND RADIOLOGICAL DATA: All  labs reviewed. White cell count 11.8, hemoglobin 15.7, hematocrit 47.9, platelets 391. LFTs are within normal limits. BMP is within normal limits except for a potassium of 3.6, creatinine of 1.1, blood sugar of 161. Urinalysis: Negative nitrite, negative leukocyte esterase.  CT scan: Small right hepatic lobe lesion, 0.9 cm, but likely a cyst or hemangioma. Normal pancreas, spleen, adrenal, and kidneys. Mid and distal aspect of the appendix which is in  abdomen is thick and concerning for a soft  tissue lesion or neoplasm, measuring roughly 2 cm in thickness and length 4.5 cm. No obvious findings significant for (Dictation Anomaly) disease or peritoneal nodularity.   ASSESSMENT AND PLAN: Daisy [name] is a pleasant 49 year old with history of gastritis who presents with abdominal pain, nausea, vomiting, diarrhea. Favor recurrent gastritis in the context of diabetes and possible gastroenteritis. CT scan findings of appendix are concerning and will likely need followup and likely surgical intervention, but due to complexity of and potential recurrence of appendiceal carcinoma requiring intraperitoneal hyperthermic chemotherapy, will discuss with daytime surgeon Dr. Michela PitcherEly and may provide referral to a major medical center and surgical oncologist there. Will admit for nausea control, pain control, IV PPI and Carafate. Will also potentially have GI evaluate patient for assistance with management of possible recurrent gastritis.    ____________________________ Daisy Raiderhristopher A. Nychelle Cassata, MD cal:jcm D: 10/24/2013 20:53:22 ET T: 10/24/2013 21:09:05 ET JOB#: [id number]  cc: Daisy Deerhristopher A. Estus Krakowski, MD, <Dictator> Daisy NewcomerHRISTOPHER A Adamae Ricklefs MD ELECTRONICALLY SIGNED 10/25/2013 4:13

## 2014-10-05 NOTE — Consult Note (Signed)
EGD with signif antral gastritis, non erosive, no ulcers.  treat with carafate and PPI, await biopsies.  Possible H. pylori treatment.  Electronic Signatures: Elliott,Scot Jun Robert T (MD)  (Signed on 15-May-15 08:26)  Authored  Last Updated: 15-May-15 08:26 by Scot JunElliott, Robert T (MD)

## 2014-10-05 NOTE — H&P (Signed)
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   Past Medical Health Hypertension, Diabetes Mellitus   Past Med/Surgical Hx:  Anxiety:   Depression:   chronic back pain:   Diabetes:   kidney stone:   Hypercholesterolemia:   gastritis:   GERD - Esophageal Reflux:   Ulcers, Gastric:   Hypertension:   Appendectomy:   12:   Left knee surgery:   Cholecystectomy:   Hysterectomy - Total:   ALLERGIES:  Sulfa drugs: Unknown  Darvocet - N: Unknown  Tylenol #3: Unknown  Motrin: GI Distress  Morphine: GI Distress  Aleve: GI Distress  Family and Social History:  Family History Non-Contributory   Place of Living Home   Review of Systems:  Fever/Chills No   Cough No   Abdominal Pain Yes   Diarrhea No   Nausea/Vomiting Yes   SOB/DOE No   Chest Pain No   Dysuria No   Tolerating Diet No  Nauseated  Vomiting   Medications/Allergies Reviewed Medications/Allergies reviewed   Physical Exam:  GEN obese, uncomfortable   HEENT pink conjunctivae   NECK supple   RESP normal resp effort  clear BS   CARD regular rate   ABD denies tenderness  granulating wound, packed with kgauze (removed), purulence   LYMPH negative neck   EXTR positive edema   SKIN normal to palpation   PSYCH alert, A+O to time, place, person, good insight   Lab Results: Hepatic:  29-Jun-15 07:46   Bilirubin, Total 0.3  Alkaline Phosphatase 71 (45-117 NOTE: New Reference Range 05/04/13)  SGPT (ALT) 19  SGOT (AST)  9  Total Protein, Serum 7.8  Albumin, Serum  3.2  Routine Chem:  29-Jun-15 07:46   Glucose, Serum  114  BUN  5  Creatinine (comp) 0.80  Sodium, Serum 139  Potassium, Serum  3.3  Chloride, Serum 102  CO2, Serum 30  Calcium  (Total), Serum 9.0  Osmolality (calc) 276  eGFR (African American) >60  eGFR (Non-African American) >60 (eGFR values <54m/min/1.73 m2 may be an indication of chronic kidney disease (CKD). Calculated eGFR is useful in patients with stable renal function. The eGFR calculation will not be reliable in acutely ill patients when serum creatinine is changing rapidly. It is not useful in  patients on dialysis. The eGFR calculation may not be applicable to patients at the low and high extremes of body sizes, pregnant women, and vegetarians.)  Anion Gap 7  Lipase 107 (Result(s) reported on 10 Dec 2013 at 08:42AM.)  Routine Hem:  29-Jun-15 07:46   WBC (CBC) 7.2  RBC (CBC)  3.56  Hemoglobin (CBC)  10.4  Hematocrit (CBC)  31.9  Platelet Count (CBC)  682  MCV 90  MCH 29.2  MCHC 32.6  RDW  15.0  Neutrophil % 57.5  Lymphocyte % 33.4  Monocyte % 6.9  Eosinophil % 1.5  Basophil % 0.7  Neutrophil # 4.2  Lymphocyte # 2.4  Monocyte # 0.5  Eosinophil # 0.1  Basophil # 0.0 (Result(s) reported on 10 Dec 2013 at 08:24AM.)   Radiology Results: LabUnknown:    28-Jun-15 13:15, CT Abdomen and Pelvis With Contrast  PACS Image  CT:  CT Abdomen and Pelvis With Contrast  REASON FOR EXAM:    (1)  s/p appy 5/12, worsening abd pain/diffuse ttp.   open wound.; (2) s/p appy 5/1  COMMENTS:   May transport without cardiac monitor    PROCEDURE: CT  - CT ABDOMEN / PELVIS  W  - Dec 09 2013  1:15PM     CLINICAL DATA:  History ofappendectomy on June 12th. Increased  pain.    EXAM:  CT ABDOMEN AND PELVIS WITH CONTRAST    TECHNIQUE:  Multidetector CT imaging of the abdomen and pelvis was performed  using the standard protocol following bolus administration of  intravenous contrast.    CONTRAST:  100 cc Isovue 370 CT abdomen pelvis 11/06/2013    COMPARISON:  CT 10/24/2013; 11/06/2013.    FINDINGS:  Visualization of the lower thorax demonstrates minimal dependent  atelectasis.    Liver is normal in  size and contour. Stable subcentimeter right  hepatic lobe low-attenuation lesion (image 20; series 2). Status  post cholecystectomy. Unchanged central biliary ductal prominence.  Spleen is unremarkable. Mild fatty atrophy of the pancreas. Normal  bilateral adrenal glands. Kidneys are symmetric in size. No  hydronephrosis.    Normal caliber abdominal aorta. No retroperitoneal lymphadenopathy.  Urinary bladder is unremarkable.    Oral contrast material is demonstrated throughout the small bowel  and colon. There is mild wall thickening and surrounding fat  stranding involving the cecum and ascending colon. No pericolonic  free fluid or evidence for abscess formation.    Postsurgical changes within the right lower anterior abdominal wall  with internal fluid and gas density measuring 7.8 x 5.6 cm.    Within the pelvis there is a 5.5 x 4.5 cm cystic mass with  suggestion of internal septations. This was not present on prior CT  No aggressive or acute appearing osseous lesions     IMPRESSION:  1. Postsurgical change within the right lower quadrant anterior  abdominal wall fat. There is a 7.8 cm gas and fluid containing  collection which is contiguous with the overlying skin most  compatible with postsurgical change and/or abscess formation. There  is overlying surgical staple lines.  2. Wall thickening of the cecum and proximal ascending colon with  pericolonic fat stranding, favored to be postsurgical in etiology.  No extraluminal contrast identified to suggest leak.  3. There is a 5.5 cm cystic mass within the pelvis, favored to be  originating from the left ovary or potentially the left fallopian  tube. Recommend further characterization/correlation with pelvic  ultrasound. Pelvic abscess is felt to be much less likely.  Electronically Signed    By: Lovey Newcomer M.D.    On: 12/09/2013 13:39         Verified By: Ilsa Iha, M.D.,    Assessment/Admission Diagnosis vomiting.  abd pain. nonacute abd exam and wound granulating will admit a nd treat   Electronic Signatures: Florene Glen (MD)  (Signed 29-Jun-15 10:18)  Authored: CHIEF COMPLAINT and HISTORY, PAST MEDICAL/SURGIAL HISTORY, ALLERGIES, FAMILY AND SOCIAL HISTORY, REVIEW OF SYSTEMS, PHYSICAL EXAM, LABS, Radiology, ASSESSMENT AND PLAN   Last Updated: 29-Jun-15 10:18 by Florene Glen (MD)

## 2014-10-05 NOTE — Consult Note (Signed)
PATIENT NAME:  Daisy [name], Daisy [name] MR#:  [id number] DATE OF BIRTH:  April 02, 1966  DATE OF CONSULTATION:  10/25/2013  REFERRING PHYSICIAN:  Harrell Gave A. Lundquist, MD CONSULTING PHYSICIAN:  Antonietta Breach, MD/Anita Laguna A. Jerelene Redden, ANP (Adult Nurse Practitioner)  REASON FOR CONSULTATION: Abdominal pain with a history of gastritis.   HISTORY OF PRESENT ILLNESS: This 49 year old patient has a history of diabetes mellitus, anxiety, depression, migraine headaches and gastritis. She reports  acute-on-chronic epigastric pain, and it became quite severe and sharp about two days ago. This was associated with 8 episodes of vomiting. No hematemesis. Vomiting did make her epigastric pain feel slightly better. She had a temperature of 99.2. She had pain that radiated from the general abdomen down into the right side into the back.  She reports problems with intermittent episodes of abdominal pain over the last year or so that prompted colonoscopy in July that was unremarkable. An upper endoscopy last February showed bile gastritis, small hiatal hernia, normal esophagus and duodenum. The patient reports she has been faithful taking her Protonix twice daily. She utilized Carafate last year and has been off of that for some time. She is on chronic narcotic medication for chronic knee and back pain. She also has chronic headaches. The patient reports decreasing appetite that waxes and wanes, occasional nausea. Weight is up and down, overall about stable. The patient reports an episode of diarrhea in the Emergency Room, but is normally not bothered by diarrhea or constipation or blood or melena in the stool. Currently, she has diffuse abdominal discomfort.   This patient was admitted in 2013 for similar pain and was noted to have thickening of her distal appendix. The CT done this admission in the ER showed an abnormal appendix with mass which is more prominent on the CT study for today. Dr. Pat Patrick from surgery has been consulted.  GI has been consulted for  assistance with management of possible recurrent gastritis.   PAST MEDICAL HISTORY:  1.  Hypertension.  2.  Chronic neck, back and knee pain with headaches.  3.  Hypertension.  4.  Diabetes mellitus.  5.  Migraine headaches.  6.  Nephrolithiasis.  7.  Hypercholesterolemia.  8.  Anxiety, depression and panic attacks.   PAST SURGICAL HISTORY:  1.  Cholecystectomy in 2002.  2.  Total hysterectomy for chronic pelvic pain. The patient reports right ovaries and right tubes are intact. The left side is gone.  3.  Knee surgeries x7, no knee replacement.   HOME MEDICATIONS:  Per list: 1.  Ambien 12.5 mg daily. 2.  Zanaflex 2 mg 1 to 2 tablets two to three times daily. 3.  Xanax 2 mg three times daily.  4.  Protonix 40 mg twice daily.  5.  Phenergan 25 mg q.6 hours as needed for nausea or vomiting.  6.  Nortriptyline 25 to 50 mg daily.  7.  Metoprolol 25 mg twice daily.  8.  Metformin 500 mg daily.  9.  Lisinopril 10 mg daily.  10.  Lamictal 100 mg twice daily.  11.  Endocet 10/325 four to seven pills daily.   ALLERGIES:  1.  MOTRIN. 2.  MORPHINE.  3.  SULFA.   4.  DARVOCET.   5.  TYLENOL NO. 3.    SOCIAL HISTORY: Negative tobacco, alcohol, or illicit drug use. The patient is single.  FAMILY HISTORY: Aunt with breast cancer. Negative for colon cancer and colon polyps.   HABITS: Occasional alcohol use.   REVIEW OF SYSTEMS:  Ten systems reviewed. Positive as noted in the history of present illness. The patient reports her anxiety and depression is fair. She is followed by Dr. Kennith Gain in psychiatry. Has a migraine headache today that started 2 days ago, typical. Right-sided abdominal discomfort more prominent now, nonspecific. It seems to start in the epigastric area and radiates downward some into the back. She feels hungry. No recent nausea or vomiting. Remaining 10 systems otherwise negative.   PHYSICAL EXAMINATION:  GENERAL: Obese, African American  female resting in bed, NAD.  VITAL SIGNS: Temperature 98.6, 71, 18, 104/68, 92% on room air.  HEENT: Head is normocephalic. Conjunctivae pink. Sclerae anicteric. Oral mucosa is moist, intact.  NECK: Stable. Trachea is midline.  CARDIAC: S1, S2 without murmur or gallop.  LUNGS: CTA. Respirations are nonlabored.  ABDOMEN: Soft, positive bowel sounds, positive diffuse tenderness, right side greater than left. There is no discrete organomegaly.  RECTAL: Deferred.  EXTREMITIES: Lower extremities without edema, cyanosis, or clubbing. Central obesity noted.  NEUROLOGIC: Cranial nerves II through XII grossly intact.  PSYCHIATRIC: Affect and mood within normal. The patient is cooperative. Appears a little worried. Appears a little anxious.   LABORATORY:  Admission blood work with WBC at 11.8, now 9.1. Liver panel is normal MET-B is unremarkable. Lipase is 84, sodium 136, potassium 3.6, BUN 13, creatinine 0.91, troponin negative. CBC is normal, including differential.   RADIOLOGY: CT of the abdomen shows abnormal appearance of the appendix. Middle and distal aspects are markedly thickened and lobulated. There is a possible lesion measuring 2 cm in thickness, and the length of the lesion is roughly 4.5 cm.  This is concerning for a neoplastic process. Small lymph nodes in the mesentery are nonspecific. Overall, no significant abdominal or pelvic lymphadenopathy. No evidence for free fluid. Stable low density structure, anterior right hepatic lobe, consistent with benign etiology such as cyst or hemangioma. Gallbladder is absent. Normal appearance of the pancreas, spleen, adrenal glands and kidneys. Normal left ovary.   Chest x-ray, PA and lateral, shows no acute pulmonary disease.   IMPRESSION: The patient presents with a 2-day history of a sharp abdominal pain, and this is acute-on-chronic pain, associated with vomiting. CT is very abnormal in the appendix area. Surgical consult is in process. She has  a  history of bile acid gastritis and a small HH per EGD last year. Suspect her current abdominal complaints are related to the appendix process. She denies NSAID use.   PLAN: Agree with Carafate restart for history bile gastritis. Continue with Protonix twice daily. The patient needs a surgical consult, which is in process.   Dr. Vira Agar reviewed this case and agrees with surgical consult. Question whether there could be possibility of appendicitis with the CT study versus malignancy. No role for luminal evaluation at this time. The patient is requesting a clear liquid diet.    Thank you for the consultation.   These services provided by Joelene Millin A. Jerelene Redden, ANP, under collaborative agreement with Manya Silvas, MD.   ____________________________ Janalyn Harder Jerelene Redden, ANP (Adult Nurse Practitioner) kam:rp D: 10/25/2013 14:51:43 ET T: 10/25/2013 15:39:54 ET JOB#: [id number]  cc: Joelene Millin A. Jerelene Redden, ANP (Adult Nurse Practitioner), <Dictator> Janalyn Harder Sherlyn Hay, MSN, ANP-BC Adult Nurse Practitioner ELECTRONICALLY SIGNED 10/25/2013 18:10

## 2014-10-05 NOTE — Op Note (Signed)
PATIENT [name]:  Daisy Daisy, Daisy Daisy MR#:  [id number] DATE OF BIRTH:  06-Mar-1966  DATE OF PROCEDURE:  11/23/2013  ATTENDING PHYSICIAN:  Daisy Daisy  PREOPERATIVE DIAGNOSIS: Probable appendiceal mass.   POSTOPERATIVE DIAGNOSIS: Large appendiceal mass, probable carcinoid.   PROCEDURE PERFORMED: Laparoscopic-assisted right hemicolectomy.   ASSISTANT: Dr. Natale LayMark [name]   ESTIMATED BLOOD LOSS:  400 mL.   COMPLICATIONS:  None.   SPECIMEN: Right colon containing an appendiceal mass.   INDICATION FOR Daisy: This patient is Daisy Daisy 49 year old female with Daisy Daisy who was found on CT to have Daisy large appendiceal mass. She was brought to the operating room for evaluation of mass and possible right hemicolectomy.   DETAILS OF PROCEDURE:  As follows: Informed consent was obtained.  The patient was brought to the operating room suite. She was Daisy on the operating room table. She was induced. Endotracheal tube was Daisy. General anesthesia was administered. Her abdomen was then prepped and draped in standard surgical fashion. Daisy Daisy, operative site, and procedure to be performed.  Daisy supraumbilical incision was made.  It was deepened down to the fascia. The fascia was incised. The peritoneum was entered. Two stay sutures were Daisy in the fasciotomy. The abdomen was insufflated. An 11 mm and 5 mm left lower quadrant suprapubic catheters were Daisy. The appendix was visualized. It was noted to have Daisy Daisy which appeared to be quite firm. Due to the size of the mass and overwhelming concern for malignancy, Daisy Daisy. I thus mobilized the right colon and hepatic flexure. There was Daisy large amount of adhesions from Daisy previous cholecystectomy . Daisy Daisy.  The transverse incision was made to right of  midline.  I transversely divided the rectus muscle which was divided carefully and hemostasis was obtained.  Daisy Daisy. The mass and the right colon were brought out through the wound. The transverse colon was ligated with Daisy Daisy. The terminal ileum was ligated with Daisy Daisy. Harmonic scalpel was used to transect the mesoappendix and mesocolon as well as suture ligatures.  The specimen was then removed. The terminal ileum was brought together with the proximal transverse colon with the side-to-side proximal end and her functional end-to-end anastomosis. Both pieces of intestine were brought together. Two enterotomies were Daisy.  The GIA [name] was used to make Daisy common channel.  GIA 60 stapler was used to close the enterotomy. The suture line was then oversewn.  It was then dropped back into the abdomen and the abdomen was irrigated. There was no obvious blood. The transverse incision was closed. The rectus and fascia was closed with Daisy running looped #1 PDS.  The skin was then stapled. All incisions were then stapled. The previously Daisy midline incision was closed by tying the previously Daisy stay sutures. Daisy sterile dressing was then Daisy over the wounds.  The patient was then awoken, extubated, and brought to the postanesthesia care unit. There were no immediate complications. Needle, sponge, and instrument counts were correct at the end of the procedure.    ____________________________ Daisy Daisy cal:dd D: 11/25/2013 11:11:00 ET T: 11/25/2013 19:20:03 ET JOB#: [id number]  cc: Daisy Deerhristopher Daisy. Daisy Jhaveri, [name], <Dictator> Daisy NewcomerHRISTOPHER Daisy Elasia Furnish [name] ELECTRONICALLY SIGNED 12/04/2013 10:54

## 2014-10-05 NOTE — Discharge Summary (Signed)
PATIENT NAME:  Daisy [name], Daisy [name] MR#:  [id number] DATE OF BIRTH:  Feb 17, 1966  DATE OF ADMISSION:  10/24/2013 DATE OF DISCHARGE:  10/27/2013  ATTENDING PHYSICIAN: Cristal Deerhristopher [name]. Darleny Sem, MD  DISCHARGE DIAGNOSES:  1. Recurrent gastritis.  2. Appendiceal mass, concern for neoplasm.  3. History of gastritis.  4. History of hypertension.  5. History of chronic back and neck pain and headaches.  6. Diabetes mellitus.  7. Kidney stones.  8. Hypercholesterolemia.  9. Anxiety.  10. Depression.  11. History of cholecystectomy in 2002.  12. History of hysterectomy for chronic pelvic pain.  13. History of left knee surgery.   DISCHARGE MEDICATIONS: As follows:  1. Lisinopril 10 mg p.o. daily.  2. Phenergan 25 p.o. q.6 hours p.r.n. nausea, vomiting. 3. Xanax 2 mg p.o. t.i.d. 4. Metformin 500 p.o. daily.  5. Protonix 40 mg p.o. b.i.d. 6. Ambien 12.5 p.o. at bedtime. 7. Endocet 10/325 p.r.n. pain. 8. Lamictal 100 mg p.o. b.i.d. 9. Metoprolol 25 mg p.o. b.i.d. 10. Nortriptyline 25 mg p.o. daily.  11. Zanaflex 4 mg 2 to 3 times p.r.n. spasms.  12. Carafate 1 gram p.o. t.i.d.  13. Imodium 2 p.o. q.4 hours.   PROCEDURE PERFORMED: Upper endoscopy on May 15th.   INDICATION FOR ADMISSION: Ms. Daisy [name] is [name] who presents with recurrent left upper quadrant pain and history of gastritis. She presented with similar symptoms. Of note, she was also noted to have an appendiceal mass which was present on previous CTs.   HOSPITAL COURSE: As follows: Ms. Daisy [name] was admitted on the 13th for resuscitation, IV pain meds as well as IV proton pump inhibitor. She was seen by gastroenterology. She had an upper endoscopy which showed gastritis. This had improved at time of discharge, and then she was discharged to home in satisfactory condition.   DISCHARGE INSTRUCTIONS: Ms. Daisy [name] is to call or return to the ED if has increased pain, nausea, vomiting. She is to be followed up as an  outpatient for further evaluation of appendiceal mass.     ____________________________ Si Raiderhristopher [name]. Shyam Dawson, MD cal:lb D: 11/14/2013 12:22:22 ET T: 11/14/2013 12:33:53 ET JOB#: [id number]  cc: Cristal Deerhristopher [name]. Cheyenna Pankowski, MD, <Dictator> Jarvis NewcomerHRISTOPHER [name] Rolene Andrades MD ELECTRONICALLY SIGNED 11/15/2013 18:04

## 2014-10-05 NOTE — Consult Note (Signed)
PATIENT NAME:  Daisy [name], Daisy [name] MR#:  [id number] DATE OF BIRTH:  01/06/1966  DATE OF CONSULTATION:  11/24/2013  REFERRING PHYSICIAN:  Loraine LericheMark [name]. Egbert GaribaldiBird, MD CONSULTING PHYSICIAN:  Hope PigeonVaibhavkumar G. Elisabeth PigeonVachhani, MD  REASON FOR MEDICAL CONSULT: Hypertension.   HISTORY OF PRESENT ILLNESS: [name] 49 year old female who has [name] history of hypertension, hyperlipidemia, bipolar disorder, diabetes, chronic pain syndrome, chronic headaches, who was admitted to hospital 15 days ago with gastritis type of symptoms. At that time, CAT scan of abdomen and GI endoscopy were done, which found there is some mass in her appendix, and was advised to follow in surgical clinic. She followed and was scheduled for surgery as outpatient. She came for surgery yesterday and taken to OR and laparoscopic-assisted right hemicolectomy was done with removal of more than 2 cm appendix mass by Dr. Juliann PulseLundquist and admitted to surgical service for further management. Today, medical service is called in because blood pressure was remaining high at 160s and 170s all night and up to this morning. On my questioning to patient, her pain was out of control and she required multiple doses of Dilaudid all night and in the morning, and finally in the morning started on Dilaudid IV PCA drip. After that, pain is better controlled and blood pressure is coming under control. She was started on clear liquid diet and is tolerating that properly.   REVIEW OF SYSTEMS:    CONSTITUTIONAL: Negative for fever, fatigue, weakness. Has pain at the surgical site. No weight loss or weight gain.  EYES: No blurring or double vision, discharge or redness.  EARS, NOSE, THROAT: No tinnitus, ear pain, or hearing loss.  RESPIRATORY: No cough, wheezing, hemoptysis, or shortness of breath.  CARDIOVASCULAR: No chest pain, orthopnea, edema, arrhythmia, palpitations.  GASTROINTESTINAL: No nausea, vomiting, diarrhea, but has abdominal pain due to surgery. GENITOURINARY: No dysuria, hematuria,  or increased frequency.  ENDOCRINE: No heat or cold intolerance. No excessive sweating. MUSCULOSKELETAL: No pain or swelling in the joints.  NEUROLOGICAL: No numbness, weakness, tremor, or vertigo.  PSYCHIATRIC: No anxiety, insomnia, or bipolar disorder.   PAST MEDICAL HISTORY: 1.  Hypertension.  2.  Hyperlipidemia.  3.  Bipolar disorder.  4.  Non-insulin-dependent diabetes mellitus.  5.  Chronic pain syndrome.  6.  Chronic headache.   PAST SURGICAL HISTORY:  1.  Multiple knee surgeries.  2.  Cholecystectomy.  3.  Hysterectomy.  4.  Right hemicolectomy done on 11/23/2013.  SOCIAL HISTORY: Lives at home with mother. No smoking. Occasional use of alcohol.   FAMILY HISTORY: Diabetes and heart disease runs in family. Mother with stroke and father with diabetes.  HOME MEDICATIONS:   1.  Zolpidem 12.5 mg oral once [name] day. 2.  Zanaflex 4 mg oral 2 capsules 3 times [name] day.  3.  Xanax 2 mg oral tablet 3 times [name] day.  4.  Protonix 40 mg oral tablet 2 times [name] day.  5.  Promethazine 25 mg oral tablet every 4 to 6 hours.  6.  Nortriptyline 25 mg oral 2 times [name] day.  7.  Metoprolol 50 mg oral once [name] day.  8.  Metformin 500 mg oral once [name] day. 9.  Lisinopril 10 mg oral once [name] day. 10.  Endocet 325/10 mg oral every 4 hours as needed for pain.   PHYSICAL EXAMINATION:  VITAL SIGNS: In the ER, temperature 98.7, heart rate 84, respirations 18. Blood pressure was 165/94 in the morning at 7:00 but came down to 148/90. GENERAL: The patient is fully  alert and oriented to time, place, and person. Does not appear in any acute distress.  HEENT: Head and neck atraumatic. Conjunctivae pink. Oral mucosa moist.  NECK: Supple. No JVD.  RESPIRATORY: Bilateral equal and clear air entry.  CARDIOVASCULAR: S1, S2 present, regular. No murmur.  ABDOMEN: Soft, tender in surgical site. Dressing present. Bowel sounds present.  SKIN: No acne, rashes, or lesions.  MUSCULOSKELETAL: No pain or tenderness or swelling.   NEUROLOGICAL: No numbness, weakness. Power 5 out of 5. Follows commands. No tremor.  PSYCHIATRIC: Does not appear in any acute psychiatric illness at this time.  EXTREMITIES: Legs: No edema.   IMPORTANT LABORATORY AND RADIOLOGICAL RESULTS: Which were done 2 days ago: Glucose 86, BUN 12, creatinine 1.14, sodium 137, potassium 3.6, chloride 104, CO2 is 29 and calcium is 8.2. WBC 6.4, hemoglobin is 12.6, platelet count is 291, and MCV is 92.   Chest x-ray, portable single view, was done for PICC line placement. It does not show any infiltrate. No pneumothorax.   ASSESSMENT AND PLAN: This is [name] 49 year old female with history of hypertension, diabetes, hyperlipidemia, chronic headache and chronic pain syndrome, admitted to surgical service for surgery for appendix mass, and laparoscopically-assisted right hemicolectomy is done. Medical consult for hypertension.  1.  Hypertension: The patient is on her home medication of lisinopril and metoprolol over here. Blood pressure is running higher. Likely this is due to pain. Now as she started on Dilaudid PCA drip, hopefully the pain should be better controlled. She is already started on clear liquid diet, so I suggested to decrease the IV fluids , which was running at 125 mL to 75 mL/hour. This might also help to control her blood pressure better. Currently would not like to add any other medications for control of her blood pressure. 2.  Diabetes: Because of surgery and not eating well, would like to just keep her on insulin sliding coverage, and later on once she is eating properly could be restarted on her home medication. 3.  Anxiety: Continue alprazolam. 4.  Chronic pain syndrome: Currently she is on Dilaudid PCA drip to control the pain. That will need to be switched back to her oral pain medications as she is taking at home.   CODE STATUS: Full code.   TOTAL TIME SPENT IN THIS CONSULT: 50 minutes. Spoke to Dr. Sampson Goon, who is the patient's primary  care physician, and he will be following from tomorrow.   ____________________________ Hope Pigeon Elisabeth Pigeon, MD vgv:jcm D: 11/24/2013 14:29:15 ET T: 11/24/2013 17:40:10 ET JOB#: [id number]  cc: Hope Pigeon. Elisabeth Pigeon, MD, <Dictator> Altamese Dilling MD ELECTRONICALLY SIGNED 11/28/2013 9:10

## 2014-10-05 NOTE — Discharge Summary (Signed)
PATIENT NAME:  Daisy GoslingOLIVER, [name] MR#:  [id number] DATE OF BIRTH:  13-Sep-1965  DATE OF ADMISSION:  11/23/2013 DATE OF DISCHARGE:  11/30/2013  DISCHARGE DIAGNOSES: 1.  Large appendiceal leiomyoma, status post laparoscopic-assisted right hemicolectomy.  2.  Morbid obesity. 3.  History of gastritis.  4.  History of hypertension.  5.  History of chronic back and neck pain and headaches.  6.  Diabetes mellitus.  7.  Kidney stones.  8.  Hypercholesterolemia.  9.  Anxiety.  10.  Depression.  11.  History of cholecystectomy.  12.  History of hysterectomy for chronic pelvic pain.  13.  History of knee surgery.    DISCHARGE MEDICATIONS:  1.  Lisinopril 10 mg p.o. daily.  2.  Xanax 2 mg p.o. t.i.d.  3.  Metformin 500 mg p.o. daily.  4.  Protonix 40 mg p.o. b.i.d. 5.  Ambien 12.5 p.o. at bedtime.  6.  Metoprolol 50 mg p.o. daily.  7.  Phenergan 25, 1 tablet p.o. q.4-6h. p.r.n. nausea.  8.  Percocet 1 to 2 tabs p.o. q.4 hours p.r.n. pain.  9.  Nortriptyline 25 mcg p.o. b.i.d.  10.  Zanaflex 4 mg, 2 caps t.i.d.    PROCEDURE PERFORMED: Extended right hemicolectomy for large appendiceal mass.   INDICATION FOR SURGERY: The patient is a pleasant, 49 year old female who presents with recurrent nausea, vomiting, who was noted to have an enlarging appendiceal mass concerning for carcinoid. She was brought to the Operating Room for definitive management of large, possibly malignant neoplasm.   HOSPITAL COURSE: The patient underwent the above-mentioned surgery on June 12. Postoperatively, she was given IV fluids, clear liquid diet and PCA for pain control. Over her hospital course, her bowel function returned. She was gradually advanced to a regular diet, which she is tolerating at the time of discharge, and was transitioned from IV to p.o. pain medications. At the time of discharge, she was taking good p.o. with good p.o. pain control, and voiding and stooling without difficulties.   DISCHARGE  INSTRUCTIONS: The patient is to follow up in approximately one week. She is to call or return to the ED if she has increased pain, nausea, vomiting, redness, drainage from incisions.      ____________________________ Si Raiderhristopher A. Lundquist, MD cal:cg D: 12/08/2013 19:05:12 ET T: 12/09/2013 07:24:33 ET JOB#: [id number]  cc: Cristal Deerhristopher A. Lundquist, MD, <Dictator> Jarvis NewcomerHRISTOPHER A LUNDQUIST MD ELECTRONICALLY SIGNED 12/17/2013 11:47

## 2014-10-06 NOTE — H&P (Signed)
PATIENT NAME:  Daisy [name], Daisy [name] MR#:  [id number] DATE OF BIRTH:  14-Jun-1966  DATE OF ADMISSION:  09/28/2011  CHIEF COMPLAINT: Epigastric pain.   HISTORY OF PRESENT ILLNESS: This is a patient with five days of epigastric pain and some right chest pain. She denies any lower abdominal pain. This has been going on for five days. She was in the ED approximately four days ago and was treated for possible gastritis. At that time her work-up showed a normal white blood cell count but her nausea and vomiting has gone on unabated. Her pain is centered in the epigastrium and right upper quadrant and, in fact, she points actually up on the right chest margin and right flank. She denies any lower abdominal pain but has had diarrhea.   Of note, the patient states she had a nearly identical episode, albeit not as severe as this, 1 to 2 months ago and was treated for gastritis at that time and apparently was being arranged to see a gastroenterologist. She sees Dr. Candelaria Stagers and Dr. Metta Clines regularly for her multiple medical problems.   She has had no melena or hematochezia. No fevers or chills. No abdominal pain.   PAST MEDICAL HISTORY:  1. Diabetes. 2. Hypertension. 3. Reflux disease. 4. Anxiety. 5. Morbid obesity.  6. Kidney stones. 7. Peptic ulcer disease.   PAST SURGICAL HISTORY:  1. TAH. One of her ovaries is still in place. 2. Cholecystectomy.  3. Multiple knee surgeries and back surgeries.   ALLERGIES: Sulfa, Darvocet, Tylenol, Motrin, and oxycodone. However, she does take Percocet regularly and has no problems with Percocet or Dilaudid.   FAMILY HISTORY: Noncontributory.   SOCIAL HISTORY: The patient lives at home.   REVIEW OF SYSTEMS: 10 system review has been performed and negative with the exception of that outlined in the history of present illness.   PHYSICAL EXAMINATION:   GENERAL: Morbidly obese female patient. She appears uncomfortable. Her weight is 275 pounds.   VITAL SIGNS:  Temperature 99.6, pulse 76, respirations 22, blood pressure 171/86. Pain scale of 7. 97% room air sat.   HEENT: No scleral icterus.   NECK: No palpable neck nodes.   CHEST: Clear to auscultation.   CARDIAC: Regular rate and rhythm.   ABDOMEN: Soft, nondistended, non-tympanitic, minimally tender in the epigastrium, minimally tender in the right upper quadrant actually on the chest wall margin, some right CVA tenderness. No right lower quadrant tenderness. No guarding, rebound, or percussion tenderness in all four quadrants. Negative Rovsing sign.   EXTREMITIES: Without edema. Calves are nontender.   NEUROLOGIC: Grossly intact.   INTEGUMENTARY: No jaundice.   LABORATORY, DIAGNOSTIC, AND RADIOLOGICAL DATA: Laboratory values show lipase 79, serum protein 8.4. Normal liver function tests. Normal electrolytes with a glucose of 102, white blood cell count 7.0, hemoglobin and hematocrit 13.7 and 41.7, platelet count 322.   Of note, labs done on April 12th showed a white blood cell count of 6.3, hemoglobin and hematocrit of 13 and 41 with a platelet count of 244 with normal electrolytes.   CT scan has been performed. Report has been relayed to me through the ER physician. No written report is available yet and the films have been personally reviewed. There is a question of possible appendicitis but there is very little stranding and a very soft suggestion of appendicitis in this patient.   ASSESSMENT AND PLAN: I personally reviewed the CT scan. The patient's clinical picture does not point towards appendicitis in any way. In fact, she's been  ill for over five days with nausea, vomiting, and diarrhea. Has had an episode like this two months ago treated with gastritis. She has a history of peptic ulcer disease and she has no right lower quadrant pain or tenderness and a normal white blood cell count on two separate occasions in the last few days.   With all of this in mind, my recommendation would be  to admit the patient to the hospital, control her nausea and vomiting, hydrate her, and re-examine her. At this point I see no indication for laparoscopy in this morbidly obese patient without signs of peritoneal irritation or even right lower quadrant pain or tenderness. I will check for C. difficile toxin titer and rehydrate.   I will also ask Dr. Candelaria Stagershaplin who knows this patient very well to see her in the hospital and because there was a suggestion by the patient that she was to see a GI physician I will ask Dr. Mechele CollinElliott who has seen the patient in the past to see her as well for evaluation for possible EGD if he believes that is necessary at this point.   ____________________________ Adah Salvageichard E. Excell Seltzerooper, MD rec:drc D: 09/28/2011 23:16:50 ET T: 09/29/2011 07:17:22 ET JOB#: [id number]  cc: Adah Salvageichard E. Excell Seltzerooper, MD, <Dictator> Lattie HawICHARD E Andrzej Scully MD ELECTRONICALLY SIGNED 09/29/2011 20:48

## 2014-10-06 NOTE — Consult Note (Signed)
Pt seen and examined. See Dawn Harrison's notes. EGD showed mild gastritis. Bx taken for inflammation and H.pylori. ADA diet ordered. Continue daily PO PPI. THanks.  Electronic Signatures: Lutricia Feilh, Edi Gorniak (MD)  (Signed on 17-Apr-13 13:25)  Authored  Last Updated: 17-Apr-13 13:25 by Lutricia Feilh, Hansel Devan (MD)

## 2014-10-06 NOTE — Consult Note (Signed)
PATIENT NAME:  Daisy [name], Daisy [name] MR#:  [id number] DATE OF BIRTH:  04-Apr-1966  DATE OF CONSULTATION:  09/29/2011  REFERRING PHYSICIAN:   CONSULTING PHYSICIAN:  Robbie Nangle C. Candelaria Stagershaplin, MD  HISTORY OF PRESENT ILLNESS: [name] is [name] 49 year old African American lady followed in my office for oral agent dependent diabetes, hypertensive cardiovascular disease which has been stable over several years on oral agents. Followed by Dr. Janeece RiggersSu, her psychiatrist, for her bipolar and anxiety/depressive syndrome which appears to have no significant change in her status for several months with stability, followed for chronic back pain and degenerative arthritis by Dr. Metta Clinesrisp, seen monthly in the pain clinic with some intermittent changes in her program. These appear to have also been fairly stable. Admitted this time with [name] week history of some nausea, vomiting, abdominal pain with [name] previous Emergency Room assessment being negative. Was seen in the office yesterday by me with her sister insisting that she be admitted to the hospital because of her deteriorating state. Was referred back to the Emergency Room for more extensive investigative studies and was admitted for possible acute appendicitis versus some other gastrointestinal symptoms. Since admission she has been more stable with less abdominal pain and less nausea.   REVIEW OF SYSTEMS: Review of systems have been stable.   PHYSICAL EXAMINATION:  GENERAL: Examination finds her pleasant, lying comfortably in bed.   VITAL SIGNS: Vital signs are stable.   HEENT: Pupils equal, round, react to light and accommodation.   NECK: Supple.   CHEST: Clear without rales, rhonchi or wheezes.   CARDIOVASCULAR: Regular rhythm without gallop, rub, or heave. Abdomen is soft, obese, normal abdominal sounds.   EXTREMITIES: Without edema or deformity.   NEUROLOGIC: She is alert x3. Moves all extremities well. No obvious paralysis or deformity.   CLINICAL IMPRESSION:  1. Abdominal  pain with nausea, vomiting, etiology unclear.  2. Hypertension well controlled with current medication. Will continue in the hospital.  3. Diabetes. A1c pending. In the past has been stable. Will hold her metformin and use insulin sliding scale in case intervention is needed or further IV contrast is needed. 4. History of some sleep apnea in the past with O2 cannula. This appears to be stable. 5. Chronic back pain with degenerative arthritis. No evidence of narcotic withdrawal. May ultimately need Dr. Metta Clinesrisp to see her if she gets into difficulty.  6. Her anxiety/depressive syndrome appears to be stable.  7. Will continue to follow. No new suggestions at this time. GI consult is still pending.   ____________________________ Jimmie Mollyon C. Candelaria Stagershaplin, MD dcc:cms D: 09/29/2011 07:41:43 ET T: 09/29/2011 09:29:07 ET JOB#: [id number]  cc: Attilio Zeitler C. Candelaria Stagershaplin, MD, <Dictator>  Virl AxeN C Zamani Crocker MD ELECTRONICALLY SIGNED 10/08/2011 6:39

## 2014-10-06 NOTE — Consult Note (Signed)
Admit Diagnosis:   ABDOMINAL PAIN: 28-Sep-2011, Active, ABDOMINAL PAIN    General Aspect -Diabetes contoled with oral agents; Been at target with a1C; - Hypertension contoled;  -Bipolar followed by Dr. Collie Siad without recent Rx change and followed  frequenly..stable -Chronic pain,(back0 Followed by Drt. Crisp montly..stble    Present Illness Admitted for  Abdominal pain with Nausia and Vomitting    Sulfa drugs: Unknown  Darvocet - N: Unknown  Tylenol #3: Unknown  Motrin: GI Distress  Oxycodone: Rash  Case History and Physical Exam:   Chief Complaint Abdominal Pain  Nausea/Vomiting    Past Medical Health Hypertension, Diabetes Mellitus    Past Surgical History Cholecystectomy    Primary Care Provider [facility]    Family History Non-Contributory    HEENT PERLA    Neck/Nodes Supple    Chest/Lungs Clear    Breasts Not examined    Cardiovascular No Murmurs or Gallops    Abdomen Active bowel sounds    Genitalia Not examined    Neurological Grossly WNL    Skin Warm  Dry   Nursing/Ancillary Notes: **Vital Signs.:   17-Apr-13 01:34   Temperature Source oral   Pulse Pulse 77   Pulse source per Dinamap   Respirations Respirations 18   Systolic BP Systolic BP 983   Diastolic BP (mmHg) Diastolic BP (mmHg) 82   Mean BP 99   BP Source Dinamap   Pulse Ox % Pulse Ox % 97    05:49   Vital Signs Type Routine   Temperature Temperature (F) 98.7   Celsius 37   Temperature Source oral   Pulse Pulse 77   Pulse source per Dinamap   Respirations Respirations 18   Systolic BP Systolic BP 382   Diastolic BP (mmHg) Diastolic BP (mmHg) 78   BP Source Dinamap   Pulse Ox % Pulse Ox % 98   Pulse Ox Activity Level  At rest   Oxygen Delivery Room Air/ 21 %     Routine UA:  16-Apr-13 14:36    Color (UA) Yellow   Glucose (UA) Negative   Bilirubin (UA) Negative   Ketones (UA) Negative   Specific Gravity (UA) 1.014   Blood (UA) Negative   pH (UA)  9.0   Protein (UA) Negative   Nitrite (UA) Negative   Leukocyte Esterase (UA) Negative  Routine Hem:  16-Apr-13 14:37    WBC (CBC) 7.0   RBC (CBC) 4.49   Hemoglobin (CBC) 13.7   Hematocrit (CBC) 41.7   Platelet Count (CBC) 322   MCV 93   MCH 30.5   MCHC 32.8   RDW 14.1  Routine Chem:  16-Apr-13 14:37    Glucose, Serum 102   BUN 6   Creatinine (comp) 0.85   Sodium, Serum 138   Potassium, Serum 4.2   Chloride, Serum 105   CO2, Serum 22   Calcium (Total), Serum 9.2  Hepatic:  16-Apr-13 14:37    Bilirubin, Total 0.2   Alkaline Phosphatase 86   SGPT (ALT) 58   SGOT (AST) 24   Total Protein, Serum 8.4   Albumin, Serum 3.9  Routine Chem:  16-Apr-13 14:37    Osmolality (calc) 273   eGFR (African American) >60   eGFR (Non-African American) >60   Anion Gap 11   Lipase 79  Routine Hem:  17-Apr-13 04:04    WBC (CBC) 7.8   RBC (CBC) 4.20   Hemoglobin (CBC) 12.6   Hematocrit (CBC) 38.9   Platelet Count (  CBC) 301   MCV 93   MCH 30.1   MCHC 32.5   RDW 13.8  Routine Chem:  17-Apr-13 04:04    Glucose, Serum 91   BUN 6   Creatinine (comp) 0.94   Sodium, Serum 141   Potassium, Serum 3.3   Chloride, Serum 105   CO2, Serum 27   Calcium (Total), Serum 8.8   Osmolality (calc) 278   eGFR (African American) >60   eGFR (Non-African American) >60   Anion Gap 9  Routine Hem:  17-Apr-13 04:04    Neutrophil % 43.7   Lymphocyte % 47.7   Monocyte % 7.4   Eosinophil % 0.8   Basophil % 0.4   Neutrophil # 3.4   Lymphocyte # 3.7   Monocyte # 0.6   Eosinophil # 0.1   Basophil # 0.0     Impression Abdominal pain/N&V; per gen surgery/Gi consult pending. appear stable Diabetes: Hold oral agent and use insulin Sliding Scale Insulun as needed BP: stable Chronic pain: stable Bipolar: No Rx changes    Plan Sliding Scale Insulun for sugar;  Will follow Consult also dictaed   Electronic Signatures: Tawni Millers (MD)  (Signed 17-Apr-13 07:42)  Authored: Health Issues,  General Aspect/Present Illness, Home Medications, Allergies, History and Physical Exam, Vital Signs, Labs, Impression/Plan   Last Updated: 17-Apr-13 07:42 by Tawni Millers (MD)

## 2014-10-06 NOTE — H&P (Signed)
Subjective/Chief Complaint epigastric pain    History of Present Illness 5 days epig and rt chest pain, nausea mult emesis and diarrhea. prev episode 2-3 mos ago, iidentical pain, rx'd as gastritis, resolved spontaneously. Was to see GI. No lower abd pain. no melena, no hematochezia. no f/c.    Past History DM HTN GERD anxiety morbid obesity kidney stones, ulcer disease PSH TAH LSO, GB knee back multiple    Past Medical Health Hypertension, Diabetes Mellitus   Past Med/Surgical Hx:  Anxiety:   Depression:   chronic back pain:   Diabetes:   kidney stone:   Hypercholesterolemia:   gastritis:   GERD - Esophageal Reflux:   Ulcers, Gastric:   Hypertension:   12:   Left knee surgery:   Cholecystectomy:   Hysterectomy - Total:   ALLERGIES:  Sulfa drugs: Unknown  Darvocet - N: Unknown  Tylenol #3: Unknown  Motrin: GI Distress  Oxycodone: Rash  Family and Social History:   Family History Non-Contributory    Place of Living Home   Review of Systems:   Fever/Chills No    Cough No    Abdominal Pain Yes  epigastric    Diarrhea Yes    Constipation No    Nausea/Vomiting Yes    SOB/DOE No    Chest Pain Yes  rt lateral    Dysuria No    Tolerating Diet No  Nauseated  Vomiting   Physical Exam:   GEN obese, uncomfortable    HEENT pink conjunctivae    NECK supple    RESP normal resp effort  clear BS    CARD regular rate    ABD positive tenderness  positive Flank Tenderness  no hernia  soft    LYMPH negative neck    EXTR negative edema    SKIN normal to palpation, skin turgor good    PSYCH alert, A+O to time, place, person, good insight    Additional Comments was in ED 4 days ago with same symptoms   Routine UA:  16-Apr-13 14:36    Color (UA) Yellow   Clarity (UA) Clear   Glucose (UA) Negative   Bilirubin (UA) Negative   Ketones (UA) Negative   Specific Gravity (UA) 1.014   Blood (UA) Negative   pH (UA) 9.0   Protein (UA) Negative   Nitrite  (UA) Negative   Leukocyte Esterase (UA) Negative  Routine Hem:  16-Apr-13 14:37    WBC (CBC) 7.0   RBC (CBC) 4.49   Hemoglobin (CBC) 13.7   Hematocrit (CBC) 41.7   Platelet Count (CBC) 322   MCV 93   MCH 30.5   MCHC 32.8   RDW 14.1  Routine Chem:  16-Apr-13 14:37    Glucose, Serum 102   BUN 6   Creatinine (comp) 0.85   Sodium, Serum 138   Potassium, Serum 4.2   Chloride, Serum 105   CO2, Serum 22   Calcium (Total), Serum 9.2  Hepatic:  16-Apr-13 14:37    Bilirubin, Total 0.2   Alkaline Phosphatase 86   SGPT (ALT) 58   SGOT (AST) 24   Total Protein, Serum 8.4   Albumin, Serum 3.9  Routine Chem:  16-Apr-13 14:37    Osmolality (calc) 273   eGFR (African American) >60   eGFR (Non-African American) >60   Anion Gap 11   Lipase 79   Radiology Results: XRay:    12-Apr-13 10:49, Chest PA and Lateral   Chest PA and Lateral   REASON FOR  EXAM:    pain, vomiting  COMMENTS:   May transport without cardiac monitor    PROCEDURE: DXR - DXR CHEST PA (OR AP) AND LATERAL  - Sep 24 2011 10:49AM     RESULT: Comparison is made to the prior exam of 11/02/2010. The lung   fields are clear. The heart, mediastinal and osseous structures show no   significant abnormalities.    IMPRESSION:   1. No acute changes are identified.    Thank you for the opportunity to contribute to the care of your patient.         Verified By: Dionne Ano WALL, M.D., MD     Assessment/Admission Diagnosis CT personally rev'd, report suggests possible appendicitis but little if any stranding. Pt has NO rlq pain or tenderness, no guarding no rebound no perc tenderness. Pt's clinical condition and hx are not consistant with Ct findings. No indication for laparoscopy at this time. rec admit hydrate, control N/V reexamine will ask Dr Mable Fill to see pt in am, and will ask for GI consult   Electronic Signatures: Florene Glen (MD)  (Signed 16-Apr-13 23:01)  Authored: CHIEF COMPLAINT and HISTORY, PAST  MEDICAL/SURGIAL HISTORY, ALLERGIES, FAMILY AND SOCIAL HISTORY, REVIEW OF SYSTEMS, PHYSICAL EXAM, LABS, Radiology, ASSESSMENT AND PLAN   Last Updated: 16-Apr-13 23:01 by Florene Glen (MD)

## 2014-10-06 NOTE — Consult Note (Signed)
PATIENT NAME:  Daisy [name], Paytan A MR#:  [id number] DATE OF BIRTH:  01-14-1966  DATE OF CONSULTATION:  09/29/2011  REFERRING PHYSICIAN:  Dr. Excell Seltzerooper CONSULTING PHYSICIAN:  Rodman Keyawn S. Chenika Nevils, NP  CHIEF COMPLAINT: Epigastric pain.   HISTORY OF PRESENT ILLNESS: Ms. Daisy [name] is a 49 year old African American female with significant past medical history of diabetes mellitus diagnosed in 2009, hypertension, gastroesophageal reflux disease, anxiety, morbid obesity, kidney stones and peptic ulcer disease. She presented [facility] Room which is now the second occurrence as she was also seen approximately two months ago for the same complaints of epigastric pain, nausea, vomiting, and diarrhea. This episode has been occurring for the past five days. Pain epigastrically radiating through to her back as well as right upper quadrant of her abdomen. The symptoms will start first with nausea then vomiting bile-like emesis, no undigested food. No hemoptysis or hematemesis. Describes the discomfort as a dull achy pain, intermittent in presentation, worse with eating. Bowels move on average every day. Last bowel movement was a couple of days ago which she feels is in correlation with the fact that she has not been eating. She was able to tolerate a liquid diet this morning. She has had no further episodes of vomiting since being seen in the Emergency Room. No recent travel. Son has been ill but more with upper respiratory symptoms. Significant for early satiety. No rectal bleeding. No melena. No NSAIDs use or aspirin use. Upper endoscopy as well as a colonoscopy done 02/29/2008. Internal nonbleeding small hemorrhoids were noted at the time of the colonoscopy. Upper endoscopy small hiatal hernia, otherwise unremarkable.   PAST MEDICAL HISTORY:  1. Diabetes mellitus diagnosed 2009.  2. Hypertension.  3. Reflux.  4. Anxiety. 5. Morbid obesity. 6. Kidney stones. 7. Peptic ulcer disease.   PAST SURGICAL HISTORY:   1. Total abdominal hysterectomy with unilateral oophorectomy. 2. Cholecystectomy with multiple gallstones being noted at that time. 3. Multiple arthroscopic knee surgeries.  4. Tubal ligation.   ALLERGIES: Sulfa, Darvocet, Tylenol, Motrin, oxycodone though she can take Percocet and Dilaudid without any difficulty.   FAMILY HISTORY: Aunt history of breast cancer. Father history of coronary artery disease as well as gastric ulcers and pancreatitis. Family history of diabetes mellitus.   SOCIAL HISTORY: No tobacco use. Alcohol socially. No heavy alcohol use. No recreational drug use. Currently on disability.   REVIEW OF SYSTEMS: All 10 systems reviewed and checked, otherwise unremarkable other than what is stated above.   PHYSICAL EXAMINATION:  VITAL SIGNS: Temperature 98.3, pulse 66, respirations 18, blood pressure 136/87, pulse oximetry 98%.   GENERAL: Well developed, morbidly obese 49 year old [place] female, no acute distress noted. Pleasant. Resting comfortably in bed as well as able to visualize patient ambulating to bathroom without any difficulty.   HEENT: Normocephalic, atraumatic. Pupils equal, reactive to light. Conjunctivae clear. Sclerae anicteric.   NECK: Supple. Trachea midline. No lymphadenopathy, thyromegaly.   PULMONARY: Symmetric rise and fall of chest. Clear to auscultation throughout.   CARDIOVASCULAR: Regular rate, rhythm, S1, S2. No murmurs, no gallops.   ABDOMEN: Very obtunded. No evidence of hepatosplenomegaly though difficult to adequately assess due to body habitus size. Bowel sounds in four quadrants. Mild discomfort noted to entire upper abdomen.   RECTAL: Deferred.   MUSCULOSKELETAL: Moving all four extremities. No contractures. No clubbing.   NEUROLOGICAL: No gross neurological deficits noted.   SKIN: No rashes. No lesions.   PSYCH: Alert and oriented x4. Memory grossly intact.    LABORATORY, DIAGNOSTIC  AND RADIOLOGICAL DATA: Glucose was  elevated at 102 on admission, corrected to 91 today. BUN is low at 6. Potassium dropped from 4.2 yesterday to 3.3 today. Hemoglobin 6.2. Lipase 79. Hepatic panel: Total protein elevated at 8.4, otherwise within normal limits. TSH 0.826. CBC within normal limits on admission as well as today. Noted decline though in hemoglobin from 13.7 to 12.6. Urinalysis within normal limits. CT scan of abdomen and pelvis with contrast revealed appendicitis. Patient has been seen by Dr. Excell Seltzer and does not feel that the presentation of her symptoms correlate with this finding.   IMPRESSION:  1. Recurrent abdominal pain with associated nausea and vomiting, abdominal pain, epigastric, as well as bilateral upper quadrants of abdomen predominantly to right side.  2. Known history of diabetes mellitus.  3. Abnormal GI x-ray finding of possible appendicitis though surgical evaluation believes that her symptoms do not correlate in this fashion. Suspect possibly more in correlation with gastritis which patient was treated for approximately two months ago.   PLAN: Patient's presentation was discussed with Dr. Lutricia Feil. Recommendation is to proceed forward with upper endoscopy today to allow direct luminal evaluation of upper GI tract to evaluate for evidence of gastric ulcers and inflammation, Helicobacter pylori. Procedure risks versus benefits discussed with patient and patient willing to proceed forward in this manner. She has already been made n.p.o. by Dr. Bluford Kaufmann after having a clear liquid diet this morning which she tolerated well. Continuation with PPI therapy. Further care management, possible imaging studies will be deemed after EGD has been performed.   Thank you for allowing [place] to participate in the care of Caya Soberanis.   These services were provided by myself under collaborative agreement with Dr. Lutricia Feil.  ____________________________ Rodman Key, NP dsh:cms D: 09/29/2011 15:28:03 ET T: 09/30/2011 09:14:53  ET  JOB#: [id number] cc: Rodman Key, NP, <Dictator> Rodman Key MD ELECTRONICALLY SIGNED 09/30/2011 17:15

## 2014-10-29 ENCOUNTER — Ambulatory Visit: Payer: Medicare Other | Attending: Pain Medicine | Admitting: Pain Medicine

## 2014-10-29 ENCOUNTER — Encounter: Payer: Self-pay | Admitting: Pain Medicine

## 2014-10-29 ENCOUNTER — Encounter (INDEPENDENT_AMBULATORY_CARE_PROVIDER_SITE_OTHER): Payer: Self-pay

## 2014-10-29 VITALS — BP 152/106 | HR 83 | Temp 98.0°F | Resp 16 | Ht 64.0 in | Wt 274.0 lb

## 2014-10-29 DIAGNOSIS — E1140 Type 2 diabetes mellitus with diabetic neuropathy, unspecified: Secondary | ICD-10-CM | POA: Insufficient documentation

## 2014-10-29 DIAGNOSIS — M5136 Other intervertebral disc degeneration, lumbar region: Secondary | ICD-10-CM | POA: Diagnosis not present

## 2014-10-29 DIAGNOSIS — E1340 Other specified diabetes mellitus with diabetic neuropathy, unspecified: Secondary | ICD-10-CM

## 2014-10-29 DIAGNOSIS — M1990 Unspecified osteoarthritis, unspecified site: Secondary | ICD-10-CM | POA: Diagnosis not present

## 2014-10-29 DIAGNOSIS — M542 Cervicalgia: Secondary | ICD-10-CM | POA: Diagnosis present

## 2014-10-29 DIAGNOSIS — M5481 Occipital neuralgia: Secondary | ICD-10-CM | POA: Insufficient documentation

## 2014-10-29 DIAGNOSIS — G43909 Migraine, unspecified, not intractable, without status migrainosus: Secondary | ICD-10-CM | POA: Insufficient documentation

## 2014-10-29 DIAGNOSIS — Z9889 Other specified postprocedural states: Secondary | ICD-10-CM | POA: Insufficient documentation

## 2014-10-29 DIAGNOSIS — M1288 Other specific arthropathies, not elsewhere classified, other specified site: Secondary | ICD-10-CM | POA: Diagnosis not present

## 2014-10-29 DIAGNOSIS — M546 Pain in thoracic spine: Secondary | ICD-10-CM | POA: Diagnosis present

## 2014-10-29 DIAGNOSIS — M170 Bilateral primary osteoarthritis of knee: Secondary | ICD-10-CM

## 2014-10-29 DIAGNOSIS — K469 Unspecified abdominal hernia without obstruction or gangrene: Secondary | ICD-10-CM | POA: Insufficient documentation

## 2014-10-29 DIAGNOSIS — G43009 Migraine without aura, not intractable, without status migrainosus: Secondary | ICD-10-CM

## 2014-10-29 MED ORDER — TIZANIDINE HCL 4 MG PO CAPS: ORAL_CAPSULE | ORAL | Status: DC

## 2014-10-29 MED ORDER — OXYCODONE HCL 10 MG PO TABS: ORAL_TABLET | ORAL | Status: DC

## 2014-10-29 NOTE — Progress Notes (Signed)
   Subjective:    Patient ID: Daisy [name], female    DOB: [birth year], 49 y.o.   MRN: [id number]  HPI    Review of Systems     Objective:   Physical Exam        Assessment & Plan:

## 2014-10-29 NOTE — Progress Notes (Signed)
   Subjective:    Patient ID: Daisy [name], female    DOB: 22-Nov-1965, 49 y.o.   MRN: [id number]  HPI  Patient is 49 year old female [facility] and treatment of pain involving the neck and entire back upper and lower extremity regions with most significant pain involving the abdominal region. Patient will undergo follow-up surgical evaluation of abdomen and we will continue medications as prescribed at this time and avoid interventional treatment. Patient is with pain involving the cervical thoracic and lumbar regions as well as headache and of pain of the upper extremity regions which has remained rather stable. We will consider modifying patient's treatment regimen pending further assess in the patient's condition will up surgical evaluations as discussed.  Review of Systems     Objective:   Physical Exam   There was tenderness over the splenius capitis musculature region palpation of this of the talus muscles reproduced pain as well moderate degree. Palpation of the acromioclavicular glenohumeral joint region with tenderness to palpation of mild degree with patient appearing to be with bilaterally equal grip strength with Tinel's and Phalen's maneuver reproducing mild discomfort. The thoracic facet thoracic paraspinal musculature region with tinged palpation of moderate degree with lateral bending rotation reproducing moderate discomfort with palpation of the lumbar facet lumbar paraspinal musculature region. There was evidence of muscle spasms of the thoracic and lumbar regions of moderate degree Sraight leg raising was tolerated to approximately 20. No increased pain with dorsiflexion of the lower extremity with straight leg raising. No definite sensory deficit or dermatomal distribution.. Knees with well-healed surgical scars without increased warmth or erythema. There was crepitus of the knees and decreased range of motion of the knees. Abdomen  with palpation right abdominal region lower quadrants no costovertebral angle tenderness noted     Assessment & Plan:   Degenerative disc disease lumbar spine L4-L5 level involving predominantly  Lumbar facet syndrome  Diabetic neuropathy  Migraine headaches  Occipital neuralgia  Hernia upper abdominal region with prior surgeries  Degenerative joint disease Knees with prior surgery  Plan  Continue present medications Zanaflex and oxycodone.  F/U PCP for evaliation of  BP and general medical  condition. Patient is to follow-up with Dr. Sampson GoonFitzgerald regarding elevated blood pressure this week as discussed  F/U surgical evaluation.  F/U neurological evaluation.  May consider radiofrequency rhizolysis or intraspinal procedures pending response to present treatment and F/U evaluation.  [facility] should patient have concerns prior to scheduled return appointment.

## 2014-10-29 NOTE — Progress Notes (Signed)
Discharged to home via ambulatory at 1150 with script in hand for oxycodone.  Return in 1 month for eval.

## 2014-10-29 NOTE — Patient Instructions (Signed)
Continue present medications. Oxycodone and Zanaflex  F/U PCP for evaliation of  BP and general medical  condition.  F/U surgical evaluation.  F/U neurological evaluation.  May consider radiofrequency rhizolysis or intraspinal procedures pending response to present treatment and F/U evaluation.  Patient to call [facility] should patient have concerns prior to scheduled return appointment.

## 2014-11-28 ENCOUNTER — Encounter: Payer: Self-pay | Admitting: Pain Medicine

## 2014-11-28 ENCOUNTER — Ambulatory Visit: Payer: Medicare Other | Attending: Pain Medicine | Admitting: Pain Medicine

## 2014-11-28 VITALS — BP 140/94 | HR 72 | Temp 98.5°F | Resp 16 | Ht 64.0 in | Wt 272.0 lb

## 2014-11-28 DIAGNOSIS — M5126 Other intervertebral disc displacement, lumbar region: Secondary | ICD-10-CM | POA: Diagnosis not present

## 2014-11-28 DIAGNOSIS — E1140 Type 2 diabetes mellitus with diabetic neuropathy, unspecified: Secondary | ICD-10-CM | POA: Diagnosis not present

## 2014-11-28 DIAGNOSIS — S83281A Other tear of lateral meniscus, current injury, right knee, initial encounter: Secondary | ICD-10-CM | POA: Insufficient documentation

## 2014-11-28 DIAGNOSIS — M1710 Unilateral primary osteoarthritis, unspecified knee: Secondary | ICD-10-CM | POA: Insufficient documentation

## 2014-11-28 DIAGNOSIS — E669 Obesity, unspecified: Secondary | ICD-10-CM | POA: Insufficient documentation

## 2014-11-28 DIAGNOSIS — M5416 Radiculopathy, lumbar region: Secondary | ICD-10-CM | POA: Diagnosis not present

## 2014-11-28 DIAGNOSIS — M170 Bilateral primary osteoarthritis of knee: Secondary | ICD-10-CM

## 2014-11-28 DIAGNOSIS — M5136 Other intervertebral disc degeneration, lumbar region: Secondary | ICD-10-CM | POA: Insufficient documentation

## 2014-11-28 DIAGNOSIS — R51 Headache: Secondary | ICD-10-CM | POA: Diagnosis present

## 2014-11-28 DIAGNOSIS — G43909 Migraine, unspecified, not intractable, without status migrainosus: Secondary | ICD-10-CM | POA: Insufficient documentation

## 2014-11-28 DIAGNOSIS — M7060 Trochanteric bursitis, unspecified hip: Secondary | ICD-10-CM | POA: Diagnosis not present

## 2014-11-28 DIAGNOSIS — M542 Cervicalgia: Secondary | ICD-10-CM | POA: Diagnosis present

## 2014-11-28 DIAGNOSIS — Z9889 Other specified postprocedural states: Secondary | ICD-10-CM | POA: Diagnosis not present

## 2014-11-28 DIAGNOSIS — E1340 Other specified diabetes mellitus with diabetic neuropathy, unspecified: Secondary | ICD-10-CM

## 2014-11-28 DIAGNOSIS — M5481 Occipital neuralgia: Secondary | ICD-10-CM | POA: Diagnosis not present

## 2014-11-28 DIAGNOSIS — M461 Sacroiliitis, not elsewhere classified: Secondary | ICD-10-CM | POA: Insufficient documentation

## 2014-11-28 DIAGNOSIS — G43109 Migraine with aura, not intractable, without status migrainosus: Secondary | ICD-10-CM

## 2014-11-28 DIAGNOSIS — M546 Pain in thoracic spine: Secondary | ICD-10-CM | POA: Diagnosis present

## 2014-11-28 DIAGNOSIS — X58XXXA Exposure to other specified factors, initial encounter: Secondary | ICD-10-CM | POA: Diagnosis not present

## 2014-11-28 DIAGNOSIS — M179 Osteoarthritis of knee, unspecified: Secondary | ICD-10-CM | POA: Insufficient documentation

## 2014-11-28 MED ORDER — OXYCODONE HCL 10 MG PO TABS: ORAL_TABLET | ORAL | Status: DC

## 2014-11-28 MED ORDER — TIZANIDINE HCL 4 MG PO CAPS: ORAL_CAPSULE | ORAL | Status: DC

## 2014-11-28 NOTE — Progress Notes (Signed)
Discharged to home ambulatory with script in hand for oxycodone.  Return in one month.

## 2014-11-28 NOTE — Progress Notes (Signed)
Subjective:    Patient ID: Daisy [name], female    DOB: 04/04/1966, 49 y.o.   MRN: [id number]  HPI   patient is 49 year old female [facility] evaluation and treatment of pain involving headaches as well as pain involving the cervical thoracic lumbar upper and lower extremity regions. Patient has undergone prior surgical intervention of the abdominal region and is scheduled for additional abdominal surgery or lesion of ovary on the left. Patient denies any increase in headaches and states that her pain involves the abdominal region lower back and lower extremity regions. We will continue medications as prescribed and avoid interventional treatment at this time. Patient is scheduled for surgical intervention and is to call [facility] should should there be significant change in plan or change in patient's condition prior to return appointment to the patient's understanding and agreement with suggested treatment plan.     Review of Systems     Objective:   Physical Exam   was tends to palpation of the splenius capitis and occipitalis musculature regions of moderate degree. No new lesions of the head and neck were noted. There was tenderness of the cervical facet and thoracic facet and paraspinal musculature regions of moderate degree. There was no crepitus of the thoracic region noted. Patient appeared to be with unremarkable Spurling's maneuver. Palpation of the acromioclavicular and glenohumeral joint regions reproduced mild discomfort. Tinel and Phalen's maneuver were without increase of pain of significant degree. Palpation over the lumbar paraspinal musculature region lumbar facet region was with moderate tends to palpation. Was moderate tends of the greater trochanteric region iliotibial band region. Examination of the knees reveal patient to be with well-healed scars of the knee without increased warmth or erythema of the knees. There was  negative anterior and posterior drawer signs and no ballottement of the patella was noted. Does appear to be decreased without definite sensory deficit of dermatomal distribution detected.  Negative clonus negative Homans. Abdomen  Was a tends to palpation of the mid and lower quadrants of the abdomen left greater than the right. No costovertebral tenderness was noted.      Assessment & Plan:   degenerative disc disease lumbar spine  L4-L5 level involvement predominantly  Small right paracentral disc protrusion L4-5 potentially impact the right L5 nerve root within the lateral recess  Minimal right foraminal narrowing at L4 which could impact the L4 nerve root as well   Lumbar radiculopathy   Lumbar facet syndrome   Sacroiliitis   Trochanteric bursitis   Diabetes mellitus with diabetic neuropathy   Degenerative disc disease of knees  Status post surgical intervention of knees   Right knee withsmall radial tear of lateral meniscus at the junction of the anterior horn and body. Also a small horizontal tear of the lateral meniscus reaching the femoral articular surface  left knee with tear of the lateral Ms. Diskus extending from the junction of the posterior horn and body into the anterior horn.   Meniscal cyst. Blunting of the free edge of the lateral meniscus is compatible with postoperative changes. Pretibial bursitis   migraine headaches   Greater occipital neuralgia myofascial pain related headaches   Obesity     Plan  Continue present medications. Zanaflex and oxycodone  F/U PCP for evaliation of  BP and general medical  Condition Patient advised to follow up Dr. Sampson Goon regarding blood pressure,  Diabetes mellitus, and general medical condition  F/U surgical evaluation. Recent undergo additional surgery  of the abdominal region as planned for mass of left ovary  F/U neurological evaluation.   follow-up Dr.Su  May consider radiofrequency rhizolysis or intraspinal  procedures pending response to present treatment and F/U evaluation.  [facility] to call [facility] should patient have concerns prior to scheduled return appointment.

## 2014-11-28 NOTE — Progress Notes (Signed)
Safety precautions to be maintained throughout the outpatient stay will include: orient to surroundings, keep bed in low position, maintain call bell within reach at all times, provide assistance with transfer out of bed and ambulation.  

## 2014-11-28 NOTE — Patient Instructions (Addendum)
Continue present medications. Oxycodone and Zanaflex   F/U PCP for evaliation of  BP and general medical  condition.. Follow-up Dr. Sampson Goon regarding elevated blood pressure and general medical condition including your diabetes as discussed  F/U surgical evaluation.  F/U neurological evaluation.  F/U gynecological surgery as planned  May consider radiofrequency rhizolysis or intraspinal procedures pending response to present treatment and F/U evaluation.  Patient to call [facility] should patient have concerns prior to scheduled return appointment.

## 2014-12-25 ENCOUNTER — Ambulatory Visit: Payer: Medicare Other | Attending: Pain Medicine | Admitting: Pain Medicine

## 2014-12-25 ENCOUNTER — Encounter: Payer: Self-pay | Admitting: Pain Medicine

## 2014-12-25 VITALS — BP 121/95 | HR 99 | Temp 96.5°F | Resp 20 | Ht 64.0 in | Wt 283.0 lb

## 2014-12-25 DIAGNOSIS — G43909 Migraine, unspecified, not intractable, without status migrainosus: Secondary | ICD-10-CM | POA: Diagnosis not present

## 2014-12-25 DIAGNOSIS — R1900 Intra-abdominal and pelvic swelling, mass and lump, unspecified site: Secondary | ICD-10-CM | POA: Diagnosis not present

## 2014-12-25 DIAGNOSIS — Z9889 Other specified postprocedural states: Secondary | ICD-10-CM | POA: Diagnosis not present

## 2014-12-25 DIAGNOSIS — M533 Sacrococcygeal disorders, not elsewhere classified: Secondary | ICD-10-CM | POA: Diagnosis not present

## 2014-12-25 DIAGNOSIS — E1340 Other specified diabetes mellitus with diabetic neuropathy, unspecified: Secondary | ICD-10-CM

## 2014-12-25 DIAGNOSIS — G43109 Migraine with aura, not intractable, without status migrainosus: Secondary | ICD-10-CM

## 2014-12-25 DIAGNOSIS — M170 Bilateral primary osteoarthritis of knee: Secondary | ICD-10-CM | POA: Diagnosis not present

## 2014-12-25 DIAGNOSIS — E1140 Type 2 diabetes mellitus with diabetic neuropathy, unspecified: Secondary | ICD-10-CM | POA: Insufficient documentation

## 2014-12-25 DIAGNOSIS — G588 Other specified mononeuropathies: Secondary | ICD-10-CM | POA: Insufficient documentation

## 2014-12-25 DIAGNOSIS — R51 Headache: Secondary | ICD-10-CM | POA: Diagnosis present

## 2014-12-25 DIAGNOSIS — M5136 Other intervertebral disc degeneration, lumbar region: Secondary | ICD-10-CM | POA: Diagnosis not present

## 2014-12-25 DIAGNOSIS — M542 Cervicalgia: Secondary | ICD-10-CM | POA: Diagnosis present

## 2014-12-25 DIAGNOSIS — M47816 Spondylosis without myelopathy or radiculopathy, lumbar region: Secondary | ICD-10-CM | POA: Diagnosis not present

## 2014-12-25 DIAGNOSIS — M5481 Occipital neuralgia: Secondary | ICD-10-CM

## 2014-12-25 MED ORDER — OXYCODONE HCL 10 MG PO TABS: ORAL_TABLET | ORAL | Status: DC

## 2014-12-25 MED ORDER — TIZANIDINE HCL 4 MG PO CAPS: ORAL_CAPSULE | ORAL | Status: DC

## 2014-12-25 NOTE — Progress Notes (Signed)
   Subjective:    Patient ID: Daisy [name], female    DOB: [birth year], 49 y.o.   MRN: [id number]  HPI   patient is 49 year old female who [facility] evaluation and treatment of pain involving headaches as well as pain involving the neck and entire back upper and lower extremity regions. At the present time [facility] and is without evidence of malignancy of the abdominal pelvic regions. At the present time patient is scheduled to undergo further evaluation of pelvic region and will be considered for surgical intervention as discussed. We will continue medications as prescribed and avoid interventional treatment. Patient denies any trauma or change in events of daily living the call significant change in condition. The patient was understanding and in agreement with suggested treatment plan.     Review of Systems     Objective:   Physical Exam there was tenderness over the splenius capitis and occipitalis musculature region of moderate degree. No new lesions of the head and neck were noted. Palpation of the region of the trapezius levator scapula and rhomboid musculature regions were with evidence of mild to moderate discomfort. Patient appeared to be with unremarkable Spurling's maneuver. Patient was with bilaterally equal grip strength. Tinel and Phalen's maneuver without increased pain of significant degree. Palpation of the lower thoracic region was with evidence of muscle spasms without crepitus of the thoracic region noted. Palpation over the lumbar paraspinal musculature region lumbar facet region associated with moderate moderately severe discomfort. Extension and palpation of the lumbar facets reproduce moderate to moderately severe discomfort. Straight leg raising was limited to approximately 20 without increase of pain with dorsiflexion noted.  Well-healed scars of the knees were noted with negative anterior and posterior drawer signs and no ballottement of the patella. There was negative clonus negative Homans. Abdomen was a tends to palpation with tenderness on the left as well as on the right without evidence of rebound tenderness. No costovertebral angle tenderness was noted      Assessment & Plan:  Degenerative disc disease of the lumbar spine Multilevel degenerative changes lumbar spine with L4-L5 level involving predominantly  Degenerative joint disease of knees Status post surgery of knees  Diabetes mellitus Diabetic neuropathy  Bilateral occipital neuralgia  Intercostal neuralgia  Migraine headaches  Status post abdominal surgeries   Masses of pelvic region  Sacroiliac joint dysfunction   Plan     Continue present medications Zanaflex and oxycodone  F/U PCP Dr. Sampson GoonFitzgerald for evaliation of  BP and general medical  condition.  F/U surgical evaluation for evaluation of pelvic mass   F/U oncological evaluation as needed  F/U surgical evaluation of knees as discussed  F/U neurological evaluation  F/U Dr.Su as planned   May consider radiofrequency rhizolysis or intraspinal procedures pending response to present treatment and F/U evaluation.  [facility] to call [facility] should patient have concerns prior to scheduled return appointment.

## 2014-12-25 NOTE — Patient Instructions (Addendum)
Continue present medications Zanaflex and oxycodone  F/U PCP Dr. Sampson GoonFitzgerald for evaliation of  BP and general medical  Condition.  Oncological evaluation. Follow-up with oncology only if needed area as discussed at the present time there is no apparent need to see oncology again  F/U surgical evaluation with pelvic surgeon as planned   F/U neurological evaluation of headaches and general neurological evaluation as needed  May consider radiofrequency rhizolysis or intraspinal procedures pending response to present treatment and F/U evaluation.  Patient to call [facility] should patient have concerns prior to scheduled return appointment.   A prescription for OXYCODONE was given to you today. A prescription for ZANAFLEX was sent to your pharmacy and should be available for pickup today.

## 2014-12-25 NOTE — Progress Notes (Signed)
Safety precautions to be maintained throughout the outpatient stay will include: orient to surroundings, keep bed in low position, maintain call bell within reach at all times, provide assistance with transfer out of bed and ambulation.  Discharged ambulatory at 12:50 pm 

## 2015-01-07 ENCOUNTER — Emergency Department: Payer: Medicare Other

## 2015-01-07 ENCOUNTER — Inpatient Hospital Stay
Admission: EM | Admit: 2015-01-07 | Discharge: 2015-01-09 | DRG: 392 | Disposition: A | Discharge status: Home / Self Care | Payer: Medicare Other | Attending: Internal Medicine | Admitting: Internal Medicine

## 2015-01-07 ENCOUNTER — Encounter: Payer: Self-pay | Admitting: Emergency Medicine

## 2015-01-07 DIAGNOSIS — F419 Anxiety disorder, unspecified: Secondary | ICD-10-CM | POA: Diagnosis present

## 2015-01-07 DIAGNOSIS — K219 Gastro-esophageal reflux disease without esophagitis: Secondary | ICD-10-CM | POA: Diagnosis present

## 2015-01-07 DIAGNOSIS — R1013 Epigastric pain: Secondary | ICD-10-CM

## 2015-01-07 DIAGNOSIS — Z818 Family history of other mental and behavioral disorders: Secondary | ICD-10-CM | POA: Diagnosis not present

## 2015-01-07 DIAGNOSIS — K439 Ventral hernia without obstruction or gangrene: Secondary | ICD-10-CM | POA: Diagnosis present

## 2015-01-07 DIAGNOSIS — E119 Type 2 diabetes mellitus without complications: Secondary | ICD-10-CM | POA: Diagnosis present

## 2015-01-07 DIAGNOSIS — Z888 Allergy status to other drugs, medicaments and biological substances status: Secondary | ICD-10-CM

## 2015-01-07 DIAGNOSIS — Z882 Allergy status to sulfonamides status: Secondary | ICD-10-CM | POA: Diagnosis not present

## 2015-01-07 DIAGNOSIS — Z833 Family history of diabetes mellitus: Secondary | ICD-10-CM

## 2015-01-07 DIAGNOSIS — Z885 Allergy status to narcotic agent status: Secondary | ICD-10-CM

## 2015-01-07 DIAGNOSIS — Z79899 Other long term (current) drug therapy: Secondary | ICD-10-CM | POA: Diagnosis not present

## 2015-01-07 DIAGNOSIS — Z9889 Other specified postprocedural states: Secondary | ICD-10-CM

## 2015-01-07 DIAGNOSIS — K2970 Gastritis, unspecified, without bleeding: Secondary | ICD-10-CM | POA: Diagnosis not present

## 2015-01-07 DIAGNOSIS — F329 Major depressive disorder, single episode, unspecified: Secondary | ICD-10-CM | POA: Diagnosis present

## 2015-01-07 DIAGNOSIS — Z886 Allergy status to analgesic agent status: Secondary | ICD-10-CM

## 2015-01-07 DIAGNOSIS — I10 Essential (primary) hypertension: Secondary | ICD-10-CM | POA: Diagnosis present

## 2015-01-07 DIAGNOSIS — Z9049 Acquired absence of other specified parts of digestive tract: Secondary | ICD-10-CM | POA: Diagnosis present

## 2015-01-07 DIAGNOSIS — G43909 Migraine, unspecified, not intractable, without status migrainosus: Secondary | ICD-10-CM | POA: Diagnosis present

## 2015-01-07 DIAGNOSIS — Z8249 Family history of ischemic heart disease and other diseases of the circulatory system: Secondary | ICD-10-CM | POA: Diagnosis not present

## 2015-01-07 DIAGNOSIS — R1110 Vomiting, unspecified: Secondary | ICD-10-CM

## 2015-01-07 DIAGNOSIS — R112 Nausea with vomiting, unspecified: Secondary | ICD-10-CM

## 2015-01-07 DIAGNOSIS — Z823 Family history of stroke: Secondary | ICD-10-CM | POA: Diagnosis not present

## 2015-01-07 DIAGNOSIS — Z90710 Acquired absence of both cervix and uterus: Secondary | ICD-10-CM | POA: Diagnosis not present

## 2015-01-07 LAB — CBC WITH DIFFERENTIAL/PLATELET
Basophils Absolute: 0 10*3/uL (ref 0–0.1)
Basophils Relative: 0 %
Eosinophils Absolute: 0 10*3/uL (ref 0–0.7)
Eosinophils Relative: 0 %
HEMATOCRIT: 39.3 % (ref 35.0–47.0)
Hemoglobin: 12.2 g/dL (ref 12.0–16.0)
Lymphocytes Relative: 7 %
Lymphs Abs: 0.9 10*3/uL — ABNORMAL LOW (ref 1.0–3.6)
MCH: 24.8 pg — AB (ref 26.0–34.0)
MCHC: 31.1 g/dL — ABNORMAL LOW (ref 32.0–36.0)
MCV: 79.6 fL — ABNORMAL LOW (ref 80.0–100.0)
MONOS PCT: 2 %
Monocytes Absolute: 0.2 10*3/uL (ref 0.2–0.9)
NEUTROS PCT: 91 %
Neutro Abs: 11 10*3/uL — ABNORMAL HIGH (ref 1.4–6.5)
Platelets: 373 10*3/uL (ref 150–440)
RBC: 4.93 MIL/uL (ref 3.80–5.20)
RDW: 19.1 % — ABNORMAL HIGH (ref 11.5–14.5)
WBC: 12.1 10*3/uL — ABNORMAL HIGH (ref 3.6–11.0)

## 2015-01-07 LAB — URINALYSIS COMPLETE WITH MICROSCOPIC (ARMC ONLY)
Bacteria, UA: NONE SEEN
Bilirubin Urine: NEGATIVE
Glucose, UA: NEGATIVE mg/dL
LEUKOCYTES UA: NEGATIVE
Nitrite: NEGATIVE
PROTEIN: 30 mg/dL — AB
SPECIFIC GRAVITY, URINE: 1.049 — AB (ref 1.005–1.030)
pH: 7 (ref 5.0–8.0)

## 2015-01-07 LAB — COMPREHENSIVE METABOLIC PANEL
ALK PHOS: 61 U/L (ref 38–126)
ALT: 22 U/L (ref 14–54)
AST: 19 U/L (ref 15–41)
Albumin: 4 g/dL (ref 3.5–5.0)
Anion gap: 11 (ref 5–15)
BILIRUBIN TOTAL: 0.5 mg/dL (ref 0.3–1.2)
BUN: 17 mg/dL (ref 6–20)
CO2: 28 mmol/L (ref 22–32)
Calcium: 8.5 mg/dL — ABNORMAL LOW (ref 8.9–10.3)
Chloride: 100 mmol/L — ABNORMAL LOW (ref 101–111)
Creatinine, Ser: 0.72 mg/dL (ref 0.44–1.00)
GFR calc Af Amer: >60 mL/min (ref >60)
GFR calc non Af Amer: >60 mL/min (ref >60)
GLUCOSE: 139 mg/dL — AB (ref 65–99)
Potassium: 3.5 mmol/L (ref 3.5–5.1)
SODIUM: 139 mmol/L (ref 135–145)
TOTAL PROTEIN: 8.3 g/dL — AB (ref 6.5–8.1)

## 2015-01-07 LAB — GLUCOSE, CAPILLARY: GLUCOSE-CAPILLARY: 145 mg/dL — AB (ref 65–99)

## 2015-01-07 LAB — LIPASE, BLOOD: Lipase: 16 U/L — ABNORMAL LOW (ref 22–51)

## 2015-01-07 MED ORDER — HYDROMORPHONE HCL 1 MG/ML IJ SOLN
1.0000 mg | Freq: Once | INTRAMUSCULAR | Status: AC
  Administered 2015-01-07: 1 mg via INTRAVENOUS
  Filled 2015-01-07: qty 1

## 2015-01-07 MED ORDER — PROMETHAZINE HCL 25 MG/ML IJ SOLN
12.5000 mg | Freq: Once | INTRAMUSCULAR | Status: AC
  Administered 2015-01-07: 12.5 mg via INTRAVENOUS

## 2015-01-07 MED ORDER — PROMETHAZINE HCL 25 MG/ML IJ SOLN
12.5000 mg | Freq: Once | INTRAMUSCULAR | Status: AC
  Administered 2015-01-07: 12.5 mg via INTRAVENOUS
  Filled 2015-01-07: qty 1

## 2015-01-07 MED ORDER — SODIUM CHLORIDE 0.9 % IV SOLN
INTRAVENOUS | Status: DC
  Administered 2015-01-07 – 2015-01-08 (×2): via INTRAVENOUS

## 2015-01-07 MED ORDER — HYDROMORPHONE HCL 1 MG/ML IJ SOLN
1.0000 mg | Freq: Once | INTRAMUSCULAR | Status: AC
  Administered 2015-01-07: 1 mg via INTRAVENOUS

## 2015-01-07 MED ORDER — SODIUM CHLORIDE 0.9 % IV BOLUS (SEPSIS)
1000.0000 mL | Freq: Once | INTRAVENOUS | Status: AC
Start: 2015-01-07 — End: 2015-01-07
  Administered 2015-01-07: 1000 mL via INTRAVENOUS

## 2015-01-07 MED ORDER — HYDRALAZINE HCL 20 MG/ML IJ SOLN: 10.0000 mg | Freq: Four times a day (QID) | INTRAMUSCULAR | Status: DC | PRN

## 2015-01-07 MED ORDER — METOPROLOL SUCCINATE ER 50 MG PO TB24
50.0000 mg | ORAL_TABLET | Freq: Every day | ORAL | Status: DC
  Administered 2015-01-07 – 2015-01-09 (×3): 50 mg via ORAL
  Filled 2015-01-07 (×3): qty 1

## 2015-01-07 MED ORDER — OXYCODONE HCL 5 MG PO TABS
5.0000 mg | ORAL_TABLET | ORAL | Status: DC | PRN
  Administered 2015-01-08 – 2015-01-09 (×2): 5 mg via ORAL
  Filled 2015-01-07 (×2): qty 1

## 2015-01-07 MED ORDER — IOHEXOL 240 MG/ML SOLN
25.0000 mL | Freq: Once | INTRAMUSCULAR | Status: AC | PRN
  Administered 2015-01-07: 25 mL via ORAL

## 2015-01-07 MED ORDER — ALPRAZOLAM 1 MG PO TABS
2.0000 mg | ORAL_TABLET | Freq: Three times a day (TID) | ORAL | Status: DC
  Administered 2015-01-07 – 2015-01-09 (×5): 2 mg via ORAL
  Filled 2015-01-07 (×5): qty 2

## 2015-01-07 MED ORDER — TIZANIDINE HCL 4 MG PO TABS
4.0000 mg | ORAL_TABLET | Freq: Three times a day (TID) | ORAL | Status: DC
  Administered 2015-01-07 – 2015-01-09 (×5): 4 mg via ORAL
  Filled 2015-01-07 (×5): qty 1

## 2015-01-07 MED ORDER — TRAZODONE HCL 50 MG PO TABS: 50.0000 mg | ORAL_TABLET | Freq: Every evening | ORAL | Status: DC | PRN

## 2015-01-07 MED ORDER — PROMETHAZINE HCL 25 MG/ML IJ SOLN
INTRAMUSCULAR | Status: AC
  Administered 2015-01-07: 12.5 mg via INTRAVENOUS
  Filled 2015-01-07: qty 1

## 2015-01-07 MED ORDER — HYDROMORPHONE HCL 1 MG/ML IJ SOLN
1.0000 mg | INTRAMUSCULAR | Status: DC | PRN
  Administered 2015-01-07 – 2015-01-09 (×7): 1 mg via INTRAVENOUS
  Filled 2015-01-07 (×7): qty 1

## 2015-01-07 MED ORDER — LAMOTRIGINE 25 MG PO TABS
100.0000 mg | ORAL_TABLET | Freq: Every day | ORAL | Status: DC
  Administered 2015-01-07 – 2015-01-09 (×3): 100 mg via ORAL
  Filled 2015-01-07 (×3): qty 4

## 2015-01-07 MED ORDER — ZOLPIDEM TARTRATE 5 MG PO TABS: 5.0000 mg | ORAL_TABLET | Freq: Every evening | ORAL | Status: DC | PRN

## 2015-01-07 MED ORDER — FLEET ENEMA 7-19 GM/118ML RE ENEM: 1.0000 | ENEMA | Freq: Once | RECTAL | Status: AC | PRN

## 2015-01-07 MED ORDER — METOCLOPRAMIDE HCL 5 MG/ML IJ SOLN: 10.0000 mg | Freq: Four times a day (QID) | INTRAMUSCULAR | Status: DC | PRN

## 2015-01-07 MED ORDER — HYDROMORPHONE HCL 1 MG/ML IJ SOLN
1.0000 mg | Freq: Once | INTRAMUSCULAR | Status: DC
  Filled 2015-01-07: qty 1

## 2015-01-07 MED ORDER — PANTOPRAZOLE SODIUM 40 MG IV SOLR
40.0000 mg | Freq: Two times a day (BID) | INTRAVENOUS | Status: DC
  Administered 2015-01-07 – 2015-01-09 (×4): 40 mg via INTRAVENOUS
  Filled 2015-01-07 (×4): qty 40

## 2015-01-07 MED ORDER — ONDANSETRON HCL 4 MG PO TABS
4.0000 mg | ORAL_TABLET | Freq: Four times a day (QID) | ORAL | Status: DC | PRN
  Filled 2015-01-07: qty 1

## 2015-01-07 MED ORDER — DOXYCYCLINE HYCLATE 100 MG PO CPEP
100.0000 mg | ORAL_CAPSULE | Freq: Two times a day (BID) | ORAL | Status: DC
  Administered 2015-01-08 (×2): 100 mg via ORAL
  Filled 2015-01-07 (×6): qty 1

## 2015-01-07 MED ORDER — DOCUSATE SODIUM 100 MG PO CAPS
100.0000 mg | ORAL_CAPSULE | Freq: Two times a day (BID) | ORAL | Status: DC
  Administered 2015-01-08 – 2015-01-09 (×3): 100 mg via ORAL
  Filled 2015-01-07 (×4): qty 1

## 2015-01-07 MED ORDER — ACETAMINOPHEN 650 MG RE SUPP: 650.0000 mg | Freq: Four times a day (QID) | RECTAL | Status: DC | PRN

## 2015-01-07 MED ORDER — NORTRIPTYLINE HCL 25 MG PO CAPS
25.0000 mg | ORAL_CAPSULE | Freq: Two times a day (BID) | ORAL | Status: DC
  Administered 2015-01-08 – 2015-01-09 (×3): 25 mg via ORAL
  Filled 2015-01-07 (×3): qty 1

## 2015-01-07 MED ORDER — IOHEXOL 350 MG/ML SOLN
100.0000 mL | Freq: Once | INTRAVENOUS | Status: AC | PRN
  Administered 2015-01-07: 100 mL via INTRAVENOUS

## 2015-01-07 MED ORDER — LISINOPRIL 20 MG PO TABS
20.0000 mg | ORAL_TABLET | Freq: Every day | ORAL | Status: DC
  Administered 2015-01-07 – 2015-01-09 (×3): 20 mg via ORAL
  Filled 2015-01-07 (×3): qty 1

## 2015-01-07 MED ORDER — PANTOPRAZOLE SODIUM 40 MG IV SOLR
40.0000 mg | Freq: Once | INTRAVENOUS | Status: AC
  Administered 2015-01-07: 40 mg via INTRAVENOUS
  Filled 2015-01-07: qty 40

## 2015-01-07 MED ORDER — ONDANSETRON HCL 4 MG/2ML IJ SOLN
4.0000 mg | Freq: Four times a day (QID) | INTRAMUSCULAR | Status: DC | PRN
  Administered 2015-01-07 – 2015-01-09 (×4): 4 mg via INTRAVENOUS
  Filled 2015-01-07 (×4): qty 2

## 2015-01-07 MED ORDER — ENOXAPARIN SODIUM 40 MG/0.4ML ~~LOC~~ SOLN
40.0000 mg | Freq: Two times a day (BID) | SUBCUTANEOUS | Status: DC
  Administered 2015-01-07 – 2015-01-09 (×4): 40 mg via SUBCUTANEOUS
  Filled 2015-01-07 (×4): qty 0.4

## 2015-01-07 MED ORDER — ACETAMINOPHEN 325 MG PO TABS: 650.0000 mg | ORAL_TABLET | Freq: Four times a day (QID) | ORAL | Status: DC | PRN

## 2015-01-07 MED ORDER — INSULIN ASPART 100 UNIT/ML ~~LOC~~ SOLN
0.0000 [IU] | SUBCUTANEOUS | Status: DC
  Administered 2015-01-07 – 2015-01-08 (×2): 1 [IU] via SUBCUTANEOUS
  Filled 2015-01-07 (×2): qty 1

## 2015-01-07 MED ORDER — PROMETHAZINE HCL 25 MG/ML IJ SOLN
12.5000 mg | Freq: Once | INTRAMUSCULAR | Status: DC
  Filled 2015-01-07: qty 1

## 2015-01-07 MED ORDER — HYDROMORPHONE HCL 1 MG/ML IJ SOLN
INTRAMUSCULAR | Status: AC
  Administered 2015-01-07: 1 mg via INTRAVENOUS
  Filled 2015-01-07: qty 1

## 2015-01-07 NOTE — H&P (Signed)
[facility] - [place] at [facility]   PATIENT NAME: Daisy [name]    MR#:  [id number]  DATE OF BIRTH:  1966/03/25  DATE OF ADMISSION:  01/07/2015  PRIMARY CARE PHYSICIAN: Daisy Braun, MD   REQUESTING/REFERRING PHYSICIAN: Dr. Inocencio [name]  CHIEF COMPLAINT:   Chief Complaint  Patient presents with  . Vomiting  . Diarrhea    HISTORY OF PRESENT ILLNESS:  Daisy [name] is a 49 y.o. female history of diabetes, hypertension, gastritis, migraines, multiple abdominal operations including hysterectomy, cholecystectomy, appendectomy with complications including cellulitis and poor wound healing requiring wound VAC who presents for evaluation of constant epigastric abdominal pain/cramping, gradual onset today at approximately 8:30 AM, so she was nonbloody nonbilious emesis as well as 3 episodes of nonbloody loose stools. No fever. No chest pain or difficulty breathing. She reports that she has had similar symptoms in the past related to gastritis. No modifying factors. Was admitted previously in [facility] for similar symptoms and had to stay 2-3 days until her symptoms resolve. No fever or redness. No recent change in medications. Saw her primary care physician Dr. [name] one week prior when she was doing well.  PAST MEDICAL HISTORY:   Past Medical History  Diagnosis Date  . Diabetes mellitus without complication   . Depression   . Hypertension   . Anxiety   . GERD (gastroesophageal reflux disease)   . Migraine     PAST SURGICAL HISTORY:   Past Surgical History  Procedure Laterality Date  . Hernia repair    . Appendectomy    . Cholecystectomy    . Knee surgery      SOCIAL HISTORY:   History  Substance Use Topics  . Smoking status: Never Smoker   . Smokeless tobacco: Not on file  . Alcohol Use: 0.0 oz/week    0 Standard drinks or equivalent per week     Comment: occasional    FAMILY HISTORY:   Family History  Problem Relation Age of  Onset  . Arthritis Mother   . Depression Mother   . Diabetes Mother   . Heart disease Mother   . Hyperlipidemia Mother   . Hypertension Mother   . Stroke Mother   . Arthritis Father   . Diabetes Father   . Hearing loss Father   . Heart disease Father   . Hyperlipidemia Father   . Hypertension Father     DRUG ALLERGIES:   Allergies  Allergen Reactions  . Acetaminophen-Codeine Hives  . Codeine Hives  . Ibuprofen Nausea And Vomiting  . Morphine Nausea And Vomiting  . Propoxyphene Other (See Comments)    Gi upset  . Sulfa Antibiotics Hives    REVIEW OF SYSTEMS:   Review of Systems  Constitutional: Negative for fever, chills and weight loss.  HENT: Negative for hearing loss and nosebleeds.   Eyes: Negative for blurred vision, double vision and pain.  Respiratory: Negative for cough, hemoptysis, sputum production, shortness of breath and wheezing.   Cardiovascular: Negative for chest pain, palpitations, orthopnea and leg swelling.  Gastrointestinal: Positive for nausea, vomiting, abdominal pain and diarrhea. Negative for constipation.  Genitourinary: Negative for dysuria and hematuria.  Musculoskeletal: Positive for back pain (chronic). Negative for myalgias and falls.  Skin: Negative for rash.  Neurological: Positive for weakness. Negative for dizziness, tremors, sensory change, speech change, focal weakness, seizures and headaches.  Endo/Heme/Allergies: Does not bruise/bleed easily.  Psychiatric/Behavioral: Positive for depression. Negative for memory loss. The patient is not nervous/anxious.  MEDICATIONS AT HOME:   Prior to Admission medications   Medication Sig Start Date End Date Taking? Authorizing Provider  alprazolam Prudy Feeler) 2 MG tablet Take 2 mg by mouth 3 (three) times daily.   Yes Historical Provider, MD  lamoTRIgine (LAMICTAL) 100 MG tablet Take 100 mg by mouth daily.   Yes Historical Provider, MD  lisinopril (PRINIVIL,ZESTRIL) 20 MG tablet Take 20 mg by  mouth daily.   Yes Historical Provider, MD  metFORMIN (GLUCOPHAGE) 500 MG tablet Take 500 mg by mouth daily.    Yes Historical Provider, MD  metoprolol succinate (TOPROL-XL) 50 MG 24 hr tablet Take 50 mg by mouth daily. Take with or immediately following a meal.   Yes Historical Provider, MD  nortriptyline (PAMELOR) 25 MG capsule Take 25 mg by mouth 2 times daily at 12 noon and 4 pm. As needed   Yes Historical Provider, MD  Oxycodone HCl 10 MG TABS Limit 4-8 tabs by mouth per day if tolerated 12/25/14  Yes Ewing Schlein, MD  pantoprazole (PROTONIX) 40 MG tablet Take 80 mg by mouth daily.    Yes Historical Provider, MD  tiZANidine (ZANAFLEX) 4 MG capsule Limited 1 tab by mouth twice a day to 4 times a day if tolerated 12/25/14  Yes Ewing Schlein, MD  traZODone (DESYREL) 50 MG tablet Take 50 mg by mouth at bedtime as needed for sleep.   Yes Historical Provider, MD  zolpidem (AMBIEN CR) 12.5 MG CR tablet Take 12.5 mg by mouth at bedtime as needed for sleep.   Yes Historical Provider, MD  doxycycline (DORYX) 100 MG DR capsule Take 100 mg by mouth 2 (two) times daily.    Historical Provider, MD      VITAL SIGNS:  Blood pressure 192/101, pulse 89, temperature 98.4 F (36.9 C), temperature source Oral, resp. rate 14, height  (1.626 m), weight 128.368 kg (283 lb), SpO2 98 %.  PHYSICAL EXAMINATION:  Physical Exam  GENERAL:  49 y.o.-year-old patient lying in the bed with no acute distress.  EYES: Pupils equal, round, reactive to light and accommodation. No scleral icterus. Extraocular muscles intact.  HEENT: Head atraumatic, normocephalic. Oropharynx and nasopharynx clear. No oropharyngeal erythema, moist oral mucosa  NECK:  Supple, no jugular venous distention. No thyroid enlargement, no tenderness.  LUNGS: Normal breath sounds bilaterally, no wheezing, rales, rhonchi. No use of accessory muscles of respiration.  CARDIOVASCULAR: S1, S2 normal. No murmurs, rubs, or gallops.  ABDOMEN: Soft,  nondistended. Bowel sounds present. No organomegaly or mass. Scars, old. Diffuse tenderness EXTREMITIES: No pedal edema, cyanosis, or clubbing. + 2 pedal & radial pulses b/l.   NEUROLOGIC: Cranial nerves II through XII are intact. No focal Motor or sensory deficits appreciated b/l PSYCHIATRIC: The patient is alert and oriented x 3. anxious  SKIN: No obvious rash, lesion, or ulcer.   LABORATORY PANEL:   CBC  Recent Labs Lab 01/07/15 1309  WBC 12.1*  HGB 12.2  HCT 39.3  PLT 373   ------------------------------------------------------------------------------------------------------------------  Chemistries   Recent Labs Lab 01/07/15 1309  NA 139  K 3.5  CL 100*  CO2 28  GLUCOSE 139*  BUN 17  CREATININE 0.72  CALCIUM 8.5*  AST 19  ALT 22  ALKPHOS 61  BILITOT 0.5   ------------------------------------------------------------------------------------------------------------------  Cardiac Enzymes No results for input(s): TROPONINI in the last 168 hours. ------------------------------------------------------------------------------------------------------------------  RADIOLOGY:  Ct Abdomen Pelvis W Contrast  01/07/2015   CLINICAL DATA:  Nausea, vomiting and diarrhea beginning this morning.  EXAM: CT ABDOMEN  AND PELVIS WITH CONTRAST  TECHNIQUE: Multidetector CT imaging of the abdomen and pelvis was performed using the standard protocol following bolus administration of intravenous contrast.  CONTRAST:  100 cc Omnipaque 350.  COMPARISON:  CT abdomen and pelvis 08/23/2014.  FINDINGS: The lung bases are clear. No pleural or pericardial effusion. Hiatal hernia is noted.  The liver is low attenuating consistent with fatty infiltration. The patient is status post cholecystectomy. The adrenal glands, spleen, pancreas and kidneys appear normal.  The ventral hernia seen on the prior examination which had measured 2.7 cm transverse by 3.5 cm craniocaudal has enlarged now measuring 6.7 cm  transverse by 6.9 cm craniocaudal. The hernia contains loops of small bowel but there is no evidence of small bowel obstruction or other complicating feature. The hernia extends along the right side of the abdomen where it contains fat which is infiltrated. There is infiltration of overlying subcutaneous fat of the anterior abdominal wall is well. Active inflammation is possible. No abscess is identified.  The uterus and right ovary have been removed. The left ovary is unremarkable. Anastomosis is seen near the ileocecal junction. The colon is otherwise unremarkable. There is no evidence of bowel obstruction. No lymphadenopathy is identified.  No focal bony abnormality is seen.  IMPRESSION: Infiltration of subcutaneous fat and fat in the right aspect of a ventral hernia could be due to scar given large abscess in this location on the prior study. The finding could also be due to panniculitis or possibly fat infarction. No abscess is identified.  Enlarged midline ventral hernia as described above.  Mild appearing fatty infiltration of the liver.  Small hiatal hernia.   Electronically Signed   By: Drusilla Kanner M.D.   On: 01/07/2015 18:32     IMPRESSION AND PLAN:   * Intractable nausea vomiting and abdominal pain. Possible causes are gastritis or adhesions from prior surgeries. Also on the CT scan her ventral hernia as increased in size compared to prior studies. But there is no ileus or obstruction found. At this time will start her on IV Protonix twice a day and treat symptomatically with Zofran and Reglan. Possibility of gastroenteritis. If symptoms don't resolve will need to consult GI.  NPO except meds.  * DM SSI, ADA  * HTN, uncontrolled Unable to keep meds down. Start IV medications. Continue home meds.  * DVT prophylaxis with Lovenox   All the records are reviewed and case discussed with ED provider. Management plans discussed with the patient, family and they are in agreement.  CODE  STATUS: FULL  TOTAL TIME TAKING CARE OF THIS PATIENT: 35 minutes.    Milagros Loll R M.D on 01/07/2015 at 7:54 PM  Between 7am to 6pm - Pager - [phone]  After 6pm go to [email or web address] - password [email or web address] [facility]  [facility]  Office  [phone]  CC: Primary care physician; Daisy Braun, MD

## 2015-01-07 NOTE — Progress Notes (Signed)
ANTICOAGULATION CONSULT NOTE - Initial Consult  Pharmacy Consult for Lovenox Indication: VTE prophylaxis  Allergies  Allergen Reactions  . Acetaminophen-Codeine Hives  . Codeine Hives  . Ibuprofen Nausea And Vomiting  . Morphine Nausea And Vomiting  . Propoxyphene Other (See Comments)    Gi upset  . Sulfa Antibiotics Hives    Patient Measurements: Height:  (162.6 cm) Weight: 283 lb (128.368 kg) IBW/kg (Calculated) : 54.7 Heparin Dosing Weight:   Vital Signs: Temp: 98.4 F (36.9 C) (07/26 1301) Temp Source: Oral (07/26 1301) BP: 199/92 mmHg (07/26 1930) Pulse Rate: 96 (07/26 1945)  Labs:  Recent Labs  01/07/15 1309  HGB 12.2  HCT 39.3  PLT 373  CREATININE 0.72    Estimated Creatinine Clearance: 116.8 mL/min (by C-G formula based on Cr of 0.72).   Medical History: Past Medical History  Diagnosis Date  . Diabetes mellitus without complication   . Depression   . Hypertension   . Anxiety   . GERD (gastroesophageal reflux disease)   . Migraine     Medications:  Prescriptions prior to admission  Medication Sig Dispense Refill Last Dose  . alprazolam (XANAX) 2 MG tablet Take 2 mg by mouth 3 (three) times daily.   01/06/2015 at Unknown time  . lamoTRIgine (LAMICTAL) 100 MG tablet Take 100 mg by mouth daily.   01/06/2015 at Unknown time  . lisinopril (PRINIVIL,ZESTRIL) 20 MG tablet Take 20 mg by mouth daily.   01/06/2015 at Unknown time  . metFORMIN (GLUCOPHAGE) 500 MG tablet Take 500 mg by mouth daily.    01/06/2015 at Unknown time  . metoprolol succinate (TOPROL-XL) 50 MG 24 hr tablet Take 50 mg by mouth daily. Take with or immediately following a meal.   01/06/2015 at Unknown time  . nortriptyline (PAMELOR) 25 MG capsule Take 25 mg by mouth 2 times daily at 12 noon and 4 pm. As needed   01/06/2015 at Unknown time  . Oxycodone HCl 10 MG TABS Limit 4-8 tabs by mouth per day if tolerated 240 tablet 0 01/06/2015 at Unknown time  . pantoprazole (PROTONIX) 40 MG  tablet Take 80 mg by mouth daily.    01/06/2015 at Unknown time  . tiZANidine (ZANAFLEX) 4 MG capsule Limited 1 tab by mouth twice a day to 4 times a day if tolerated 120 capsule 0 01/06/2015 at Unknown time  . traZODone (DESYREL) 50 MG tablet Take 50 mg by mouth at bedtime as needed for sleep.   01/06/2015 at Unknown time  . zolpidem (AMBIEN CR) 12.5 MG CR tablet Take 12.5 mg by mouth at bedtime as needed for sleep.   01/06/2015 at Unknown time  . doxycycline (DORYX) 100 MG DR capsule Take 100 mg by mouth 2 (two) times daily.   Taking    Assessment: CrCl = 116.8 ml/min BMI = 48.7  Goal of Therapy:  DVT prophylaxis   Plan:  Lovenox 40 mg SQ Q24H originally ordered.  Will increase dose to Lovenox 40 mg SQ Q12H based on BMI > 40 .   Nichola Warren D 01/07/2015,8:24 PM

## 2015-01-07 NOTE — ED Notes (Signed)
Pt via ems from home; has had nausea, vomiting, diarrhea since 0830. Pt given 12 mg zofran en route and still vomited several times. Pt hx of SM, HTN, depression, anxiety. No meds taken today. Pt alert & oriented.

## 2015-01-07 NOTE — ED Provider Notes (Signed)
[facility] Emergency Department Provider Note  ____________________________________________  Time seen: Approximately 12:59 PM  I have reviewed the triage vital signs and the nursing notes.   HISTORY  Chief Complaint Vomiting and Diarrhea    HPI Daisy [name] is a 49 y.o. female history of diabetes, hypertension, gastritis, migraines, multiple abdominal operations including hysterectomy, cholecystectomy, appendectomy with complications including cellulitis and poor wound healing requiring wound VAC who presents for evaluation of constant epigastric abdominal pain/cramping, gradual onset today at approximately 8:30 AM, so she was nonbloody nonbilious emesis as well as 3 episodes of nonbloody loose stools. No fever. No chest pain or difficulty breathing. She reports that she has had similar symptoms in the past related to gastritis. No modifying factors.   Past Medical History  Diagnosis Date  . Diabetes mellitus without complication   . Depression   . Hypertension   . Anxiety   . GERD (gastroesophageal reflux disease)   . Migraine     Patient Active Problem List   Diagnosis Date Noted  . DDD (degenerative disc disease), lumbar 11/28/2014  . DJD (degenerative joint disease) of knee 11/28/2014  . Migraine headache 11/28/2014  . Bilateral occipital neuralgia 11/28/2014  . Morbid obesity 11/28/2014  . Neuropathy due to secondary diabetes 11/28/2014    Past Surgical History  Procedure Laterality Date  . Hernia repair    . Appendectomy    . Cholecystectomy    . Knee surgery      Current Outpatient Rx  Name  Route  Sig  Dispense  Refill  . alprazolam (XANAX) 2 MG tablet   Oral   Take 2 mg by mouth 3 (three) times daily.         Marland Kitchen lamoTRIgine (LAMICTAL) 100 MG tablet   Oral   Take 100 mg by mouth daily.         Marland Kitchen lisinopril (PRINIVIL,ZESTRIL) 20 MG tablet   Oral   Take 20 mg by mouth daily.         . metFORMIN (GLUCOPHAGE) 500 MG  tablet   Oral   Take 500 mg by mouth daily.          . metoprolol succinate (TOPROL-XL) 50 MG 24 hr tablet   Oral   Take 50 mg by mouth daily. Take with or immediately following a meal.         . nortriptyline (PAMELOR) 25 MG capsule   Oral   Take 25 mg by mouth 2 times daily at 12 noon and 4 pm. As needed         . Oxycodone HCl 10 MG TABS      Limit 4-8 tabs by mouth per day if tolerated   240 tablet   0   . pantoprazole (PROTONIX) 40 MG tablet   Oral   Take 80 mg by mouth daily.          Marland Kitchen tiZANidine (ZANAFLEX) 4 MG capsule      Limited 1 tab by mouth twice a day to 4 times a day if tolerated   120 capsule   0   . traZODone (DESYREL) 50 MG tablet   Oral   Take 50 mg by mouth at bedtime as needed for sleep.         Marland Kitchen zolpidem (AMBIEN CR) 12.5 MG CR tablet   Oral   Take 12.5 mg by mouth at bedtime as needed for sleep.         Marland Kitchen doxycycline (DORYX) 100  MG DR capsule   Oral   Take 100 mg by mouth 2 (two) times daily.           Allergies Acetaminophen-codeine; Codeine; Ibuprofen; Morphine; Propoxyphene; and Sulfa antibiotics  Family History  Problem Relation Age of Onset  . Arthritis Mother   . Depression Mother   . Diabetes Mother   . Heart disease Mother   . Hyperlipidemia Mother   . Hypertension Mother   . Stroke Mother   . Arthritis Father   . Diabetes Father   . Hearing loss Father   . Heart disease Father   . Hyperlipidemia Father   . Hypertension Father     Social History History  Substance Use Topics  . Smoking status: Never Smoker   . Smokeless tobacco: Not on file  . Alcohol Use: 0.0 oz/week    0 Standard drinks or equivalent per week     Comment: occasional    Review of Systems Constitutional: No fever/chills Eyes: No visual changes. ENT: No sore throat. Cardiovascular: Denies chest pain. Respiratory: Denies shortness of breath. Gastrointestinal: + abdominal pain.  + nausea, + vomiting.  +Loose stools.  No  constipation. Genitourinary: Negative for dysuria. Musculoskeletal: Negative for back pain. Skin: Negative for rash. Neurological: Negative for headaches, focal weakness or numbness.  10-point ROS otherwise negative.  ____________________________________________   PHYSICAL EXAM:  Filed Vitals:   01/07/15 1301  BP: 192/101  Pulse: 89  Temp: 98.4 F (36.9 C)  TempSrc: Oral  Resp: 14  Height: 5\' 4"  (1.626 m)  Weight: 283 lb (128.368 kg)  SpO2: 98%      Constitutional: Alert and oriented. In distress due to nausea/actively vomiting yellow emesis. Eyes: Conjunctivae are normal. PERRL. EOMI. Head: Atraumatic. Nose: No congestion/rhinnorhea. Mouth/Throat: Mucous membranes are moist.  Oropharynx non-erythematous. Neck: No stridor.  Cardiovascular: Normal rate, regular rhythm. Grossly normal heart sounds.  Good peripheral circulation. Respiratory: Normal respiratory effort.  No retractions. Lungs CTAB. Gastrointestinal: Soft with moderate diffuse tenderness and no obese body habitus somewhat limits examination. No distention. No abdominal bruits. No CVA tenderness. Genitourinary: deferred Musculoskeletal: No lower extremity tenderness nor edema.  No joint effusions. Neurologic:  Normal speech and language. No gross focal neurologic deficits are appreciated. No gait instability. Skin:  Skin is warm, dry and intact. No rash noted. Psychiatric: Mood and affect are normal. Speech and behavior are normal.  ____________________________________________   LABS (all labs ordered are listed, but only abnormal results are displayed)  Labs Reviewed  CBC WITH DIFFERENTIAL/PLATELET - Abnormal; Notable for the following:    WBC 12.1 (*)    MCV 79.6 (*)    MCH 24.8 (*)    MCHC 31.1 (*)    RDW 19.1 (*)    Neutro Abs 11.0 (*)    Lymphs Abs 0.9 (*)    All other components within normal limits  COMPREHENSIVE METABOLIC PANEL - Abnormal; Notable for the following:    Chloride 100 (*)     Glucose, Bld 139 (*)    Calcium 8.5 (*)    Total Protein 8.3 (*)    All other components within normal limits  LIPASE, BLOOD - Abnormal; Notable for the following:    Lipase 16 (*)    All other components within normal limits  URINALYSIS COMPLETEWITH MICROSCOPIC ([facility] ONLY)   ____________________________________________  EKG  none ____________________________________________  RADIOLOGY  CT abdomen and pelvis IMPRESSION: Infiltration of subcutaneous fat and fat in the right aspect of a ventral hernia could be due to  scar given large abscess in this location on the prior study. The finding could also be due to panniculitis or possibly fat infarction. No abscess is identified.  Enlarged midline ventral hernia as described above.  Mild appearing fatty infiltration of the liver.  Small hiatal hernia.  ____________________________________________   PROCEDURES  Procedure(s) performed: None  Critical Care performed: No  ____________________________________________   INITIAL IMPRESSION / ASSESSMENT AND PLAN / ED COURSE  Pertinent labs & imaging results that were available during my care of the patient were reviewed by me and considered in my medical decision making (see chart for details).  Daisy [name] is a 49 y.o. female history of diabetes, hypertension, gastritis, migraines, multiple abdominal operations including hysterectomy, cholecystectomy, appendectomy with complications including cellulitis and poor wound healing requiring wound VAC who presents for evaluation of constant epigastric abdominal as well as vomiting and loose stools today. On exam, she is actively retching. Vital signs stable, she is afebrile. We'll treat her symptomatically with antiemetics, pain control, obtain screening labs, urinalysis.   ----------------------------------------- 7:04 PM on 01/07/2015 -----------------------------------------  Labs notable for leukocytosis. CT scan with  enlarging ventral hernia but no obstruction, there is some infiltration of subcutaneous fat associated with a ventral hernia is nonspecific and may represent scar, panniculitis or fat infarction. No acute surgical indication. At this time, she has received several doses of Dilaudid, Phenergan, Zofran and has persistent pain and nausea with wretching. Case discussed with hospitalist for admission at 7 PM. ____________________________________________   FINAL CLINICAL IMPRESSION(S) / ED DIAGNOSES  Final diagnoses:  Epigastric pain  Nausea and vomiting, vomiting of unspecified type  Intractable vomiting with nausea, vomiting of unspecified type      Gayla Doss, MD 01/07/15 1907

## 2015-01-08 LAB — BASIC METABOLIC PANEL
Anion gap: 10 (ref 5–15)
BUN: 16 mg/dL (ref 6–20)
CHLORIDE: 102 mmol/L (ref 101–111)
CO2: 29 mmol/L (ref 22–32)
CREATININE: 0.84 mg/dL (ref 0.44–1.00)
Calcium: 8.8 mg/dL — ABNORMAL LOW (ref 8.9–10.3)
GFR calc Af Amer: >60 mL/min (ref >60)
Glucose, Bld: 109 mg/dL — ABNORMAL HIGH (ref 65–99)
Potassium: 3.3 mmol/L — ABNORMAL LOW (ref 3.5–5.1)
Sodium: 141 mmol/L (ref 135–145)

## 2015-01-08 LAB — CBC
HCT: 36.1 % (ref 35.0–47.0)
Hemoglobin: 11.7 g/dL — ABNORMAL LOW (ref 12.0–16.0)
MCH: 26.1 pg (ref 26.0–34.0)
MCHC: 32.3 g/dL (ref 32.0–36.0)
MCV: 80.8 fL (ref 80.0–100.0)
PLATELETS: 346 10*3/uL (ref 150–440)
RBC: 4.47 MIL/uL (ref 3.80–5.20)
RDW: 18.8 % — ABNORMAL HIGH (ref 11.5–14.5)
WBC: 9.7 10*3/uL (ref 3.6–11.0)

## 2015-01-08 LAB — GLUCOSE, CAPILLARY
GLUCOSE-CAPILLARY: 96 mg/dL (ref 65–99)
Glucose-Capillary: 102 mg/dL — ABNORMAL HIGH (ref 65–99)
Glucose-Capillary: 125 mg/dL — ABNORMAL HIGH (ref 65–99)
Glucose-Capillary: 154 mg/dL — ABNORMAL HIGH (ref 65–99)
Glucose-Capillary: 86 mg/dL (ref 65–99)
Glucose-Capillary: 88 mg/dL (ref 65–99)

## 2015-01-08 LAB — MAGNESIUM: MAGNESIUM: 1.9 mg/dL (ref 1.7–2.4)

## 2015-01-08 MED ORDER — POTASSIUM CHLORIDE CRYS ER 20 MEQ PO TBCR
20.0000 meq | EXTENDED_RELEASE_TABLET | Freq: Two times a day (BID) | ORAL | Status: DC
  Administered 2015-01-08 – 2015-01-09 (×3): 20 meq via ORAL
  Filled 2015-01-08 (×3): qty 1

## 2015-01-08 NOTE — Progress Notes (Signed)
[facility] [place] [facility]   PATIENT NAME: Daisy [name]    MR#:  [id number]  DATE OF BIRTH:  January 01, 1966  SUBJECTIVE:  CHIEF COMPLAINT:   Chief Complaint  Patient presents with  . Vomiting  . Diarrhea    Still have some nauseaa, want to try liquids.  REVIEW OF SYSTEMS:  CONSTITUTIONAL: No fever, fatigue or weakness.  EYES: No blurred or double vision.  EARS, NOSE, AND THROAT: No tinnitus or ear pain.  RESPIRATORY: No cough, shortness of breath, wheezing or hemoptysis.  CARDIOVASCULAR: No chest pain, orthopnea, edema.  GASTROINTESTINAL: mild nausea, No vomiting, diarrhea or abdominal pain.  GENITOURINARY: No dysuria, hematuria.  ENDOCRINE: No polyuria, nocturia,  HEMATOLOGY: No anemia, easy bruising or bleeding SKIN: No rash or lesion. MUSCULOSKELETAL: No joint pain or arthritis.   NEUROLOGIC: No tingling, numbness, weakness.  PSYCHIATRY: No anxiety or depression.   ROS  DRUG ALLERGIES:   Allergies  Allergen Reactions  . Acetaminophen-Codeine Hives  . Codeine Hives  . Ibuprofen Nausea And Vomiting  . Morphine Nausea And Vomiting  . Propoxyphene Other (See Comments)    Gi upset  . Sulfa Antibiotics Hives    VITALS:  Blood pressure 136/78, pulse 60, temperature 98.3 F (36.8 C), temperature source Oral, resp. rate 16, height  (1.626 m), weight 123.832 kg (273 lb), SpO2 97 %.  PHYSICAL EXAMINATION:  GENERAL:  49 y.o.-year-old patient lying in the bed with no acute distress.  EYES: Pupils equal, round, reactive to light and accommodation. No scleral icterus. Extraocular muscles intact.  HEENT: Head atraumatic, normocephalic. Oropharynx and nasopharynx clear.  NECK:  Supple, no jugular venous distention. No thyroid enlargement, no tenderness.  LUNGS: Normal breath sounds bilaterally, no wheezing, rales,rhonchi or crepitation. No use of accessory muscles of respiration.  CARDIOVASCULAR: S1, S2 normal. No murmurs, rubs, or gallops.   ABDOMEN: Soft, nontender, nondistended. Bowel sounds present. No organomegaly or mass. Scars of surgery present. EXTREMITIES: No pedal edema, cyanosis, or clubbing.  NEUROLOGIC: Cranial nerves II through XII are intact. Muscle strength 5/5 in all extremities. Sensation intact. Gait not checked.  PSYCHIATRIC: The patient is alert and oriented x 3.  SKIN: No obvious rash, lesion, or ulcer.   Physical Exam LABORATORY PANEL:   CBC  Recent Labs Lab 01/08/15 0653  WBC 9.7  HGB 11.7*  HCT 36.1  PLT 346   ------------------------------------------------------------------------------------------------------------------  Chemistries   Recent Labs Lab 01/07/15 1309 01/08/15 0653  NA 139 141  K 3.5 3.3*  CL 100* 102  CO2 28 29  GLUCOSE 139* 109*  BUN 17 16  CREATININE 0.72 0.84  CALCIUM 8.5* 8.8*  MG  --  1.9  AST 19  --   ALT 22  --   ALKPHOS 61  --   BILITOT 0.5  --    ------------------------------------------------------------------------------------------------------------------  Cardiac Enzymes No results for input(s): TROPONINI in the last 168 hours. ------------------------------------------------------------------------------------------------------------------  RADIOLOGY:  Ct Abdomen Pelvis W Contrast  01/07/2015   CLINICAL DATA:  Nausea, vomiting and diarrhea beginning this morning.  EXAM: CT ABDOMEN AND PELVIS WITH CONTRAST  TECHNIQUE: Multidetector CT imaging of the abdomen and pelvis was performed using the standard protocol following bolus administration of intravenous contrast.  CONTRAST:  100 cc Omnipaque 350.  COMPARISON:  CT abdomen and pelvis 08/23/2014.  FINDINGS: The lung bases are clear. No pleural or pericardial effusion. Hiatal hernia is noted.  The liver is low attenuating consistent with fatty infiltration. The patient is status post cholecystectomy.  The adrenal glands, spleen, pancreas and kidneys appear normal.  The ventral hernia seen on the prior  examination which had measured 2.7 cm transverse by 3.5 cm craniocaudal has enlarged now measuring 6.7 cm transverse by 6.9 cm craniocaudal. The hernia contains loops of small bowel but there is no evidence of small bowel obstruction or other complicating feature. The hernia extends along the right side of the abdomen where it contains fat which is infiltrated. There is infiltration of overlying subcutaneous fat of the anterior abdominal wall is well. Active inflammation is possible. No abscess is identified.  The uterus and right ovary have been removed. The left ovary is unremarkable. Anastomosis is seen near the ileocecal junction. The colon is otherwise unremarkable. There is no evidence of bowel obstruction. No lymphadenopathy is identified.  No focal bony abnormality is seen.  IMPRESSION: Infiltration of subcutaneous fat and fat in the right aspect of a ventral hernia could be due to scar given large abscess in this location on the prior study. The finding could also be due to panniculitis or possibly fat infarction. No abscess is identified.  Enlarged midline ventral hernia as described above.  Mild appearing fatty infiltration of the liver.  Small hiatal hernia.   Electronically Signed   By: Drusilla Kanner M.D.   On: 01/07/2015 18:32    ASSESSMENT AND PLAN:   * Intractable nausea vomiting and abdominal pain. Possible cause- gastritis   Also on the CT scan her ventral hernia as increased in size compared to prior studies. But there is no ileus or obstruction found.  on IV Protonix twice a day and treat symptomatically with Zofran and Reglan.  If symptoms don't resolve will need to consult GI.  Trial of clear liquids.  * DM SSI, ADA  * HTN, uncontrolled Unable to keep meds down. Start IV medications. Continue home meds.  * DVT prophylaxis with Lovenox  All the records are reviewed and case discussed with Care Management/Social Workerr. Management plans discussed with the patient,  family and they are in agreement.  CODE STATUS: full  TOTAL TIME TAKING CARE OF THIS PATIENT: 35 minutes.     POSSIBLE D/C IN 1-2 DAYS, DEPENDING ON CLINICAL CONDITION.   Altamese Dilling M.D on 01/08/2015   Between 7am to 6pm - Pager - [phone]  After 6pm go to [email or web address] - password [email or web address] [facility]  [facility] [place] [facility]  Office  [phone]  CC: Primary care physician; Clydie Braun, MD

## 2015-01-09 ENCOUNTER — Other Ambulatory Visit: Payer: Self-pay | Admitting: Pain Medicine

## 2015-01-09 LAB — GLUCOSE, CAPILLARY
GLUCOSE-CAPILLARY: 94 mg/dL (ref 65–99)
Glucose-Capillary: 85 mg/dL (ref 65–99)
Glucose-Capillary: 87 mg/dL (ref 65–99)
Glucose-Capillary: 114 mg/dL — ABNORMAL HIGH (ref 65–99)

## 2015-01-09 MED ORDER — DOXYCYCLINE HYCLATE 100 MG PO TABS
100.0000 mg | ORAL_TABLET | Freq: Two times a day (BID) | ORAL | Status: DC
  Administered 2015-01-09: 100 mg via ORAL
  Filled 2015-01-09: qty 1

## 2015-01-09 MED ORDER — OXYCODONE HCL 5 MG PO TABS: 5.0000 mg | ORAL_TABLET | ORAL | Status: DC | PRN

## 2015-01-09 MED ORDER — ONDANSETRON HCL 4 MG PO TABS: 4.0000 mg | ORAL_TABLET | Freq: Four times a day (QID) | ORAL | Status: DC | PRN

## 2015-01-09 NOTE — Discharge Summary (Signed)
[facility] [place] [facility]   PATIENT NAME: Daisy [name]    MR#:  [id number]  DATE OF BIRTH:  Jun 24, 1965  DATE OF ADMISSION:  01/07/2015 ADMITTING PHYSICIAN: Milagros Loll, MD  DATE OF DISCHARGE:01/09/2015  PRIMARY CARE PHYSICIAN: Daisy [name], DAVID, MD    ADMISSION DIAGNOSIS:  Epigastric pain [R10.13] Intractable vomiting with nausea, vomiting of unspecified type [R11.10] Nausea and vomiting, vomiting of unspecified type [R11.2]  DISCHARGE DIAGNOSIS:  Active Problems:   Gastritis   SECONDARY DIAGNOSIS:   Past Medical History  Diagnosis Date  . Diabetes mellitus without complication   . Depression   . Hypertension   . Anxiety   . GERD (gastroesophageal reflux disease)   . Migraine     HOSPITAL COURSE:   * Intractable nausea vomiting and abdominal pain. Possible cause- gastritis  Also on the CT scan her ventral hernia as increased in size compared to prior studies. But there is no ileus or obstruction found.  on IV Protonix twice a day and treat symptomatically with Zofran and Reglan.  Felt better after one day, tolerated soft diet.  * DM SSI, ADA  * HTN, uncontrolled Unable to keep meds down. Start IV medications. Continue home meds.  * DVT prophylaxis with Lovenox  DISCHARGE CONDITIONS:  Stable.  CONSULTS OBTAINED:     DRUG ALLERGIES:   Allergies  Allergen Reactions  . Acetaminophen-Codeine Hives  . Codeine Hives  . Ibuprofen Nausea And Vomiting  . Morphine Nausea And Vomiting  . Propoxyphene Other (See Comments)    Gi upset  . Sulfa Antibiotics Hives    DISCHARGE MEDICATIONS:   Current Discharge Medication List    START taking these medications   Details  ondansetron (ZOFRAN) 4 MG tablet Take 1 tablet (4 mg total) by mouth every 6 (six) hours as needed for nausea. Qty: 20 tablet, Refills: 0      CONTINUE these medications which have CHANGED   Details  oxyCODONE (OXY IR/ROXICODONE) 5 MG immediate  release tablet Take 1 tablet (5 mg total) by mouth every 4 (four) hours as needed for moderate pain. Qty: 16 tablet, Refills: 0      CONTINUE these medications which have NOT CHANGED   Details  alprazolam (XANAX) 2 MG tablet Take 2 mg by mouth 3 (three) times daily.    lamoTRIgine (LAMICTAL) 100 MG tablet Take 100 mg by mouth daily.    lisinopril (PRINIVIL,ZESTRIL) 20 MG tablet Take 20 mg by mouth daily.    metFORMIN (GLUCOPHAGE) 500 MG tablet Take 500 mg by mouth daily.     metoprolol succinate (TOPROL-XL) 50 MG 24 hr tablet Take 50 mg by mouth daily. Take with or immediately following a meal.    nortriptyline (PAMELOR) 25 MG capsule Take 25 mg by mouth 2 times daily at 12 noon and 4 pm. As needed    pantoprazole (PROTONIX) 40 MG tablet Take 80 mg by mouth daily.     tiZANidine (ZANAFLEX) 4 MG capsule Limited 1 tab by mouth twice a day to 4 times a day if tolerated Qty: 120 capsule, Refills: 0    traZODone (DESYREL) 50 MG tablet Take 50 mg by mouth at bedtime as needed for sleep.    zolpidem (AMBIEN CR) 12.5 MG CR tablet Take 12.5 mg by mouth at bedtime as needed for sleep.    doxycycline (DORYX) 100 MG DR capsule Take 100 mg by mouth 2 (two) times daily.         DISCHARGE INSTRUCTIONS:  Follow with PMD in 1 week./  If you experience worsening of your admission symptoms, develop shortness of breath, life threatening emergency, suicidal or homicidal thoughts you must seek medical attention immediately by calling 911 or calling your MD immediately  if symptoms less severe.  You Must read complete instructions/literature along with all the possible adverse reactions/side effects for all the Medicines you take and that have been prescribed to you. Take any new Medicines after you have completely understood and accept all the possible adverse reactions/side effects.   Please note  You were cared for by a hospitalist during your hospital stay. If you have any questions about  your discharge medications or the care you received while you were in the hospital after you are discharged, you can call the unit and asked to speak with the hospitalist on call if the hospitalist that took care of you is not available. Once you are discharged, your primary care physician will handle any further medical issues. Please note that NO REFILLS for any discharge medications will be authorized once you are discharged, as it is imperative that you return to your primary care physician (or establish a relationship with a primary care physician if you do not have one) for your aftercare needs so that they can reassess your need for medications and monitor your lab values.    Today   CHIEF COMPLAINT:   Chief Complaint  Patient presents with  . Vomiting  . Diarrhea    HISTORY OF PRESENT ILLNESS:  Daisy [name]  is a 49 y.o. female with a known history of diabetes, hypertension, gastritis, migraines, multiple abdominal operations including hysterectomy, cholecystectomy, appendectomy with complications including cellulitis and poor wound healing requiring wound VAC who presents for evaluation of constant epigastric abdominal pain/cramping, gradual onset today at approximately 8:30 AM, so she was nonbloody nonbilious emesis as well as 3 episodes of nonbloody loose stools. No fever. No chest pain or difficulty breathing. She reports that she has had similar symptoms in the past related to gastritis. No modifying factors. Was admitted previously in [facility] for similar symptoms and had to stay 2-3 days until her symptoms resolve. No fever or redness. No recent change in medications. Saw her primary care physician Dr. [name] one week prior when she was doing well.   VITAL SIGNS:  Blood pressure 125/71, pulse 82, temperature 97.9 F (36.6 C), temperature source Oral, resp. rate 17, height 5\' 4"  (1.626 m), weight 127.053 kg (280 lb 1.6 oz), SpO2 100 %.  I/O:   Intake/Output Summary  (Last 24 hours) at 01/09/15 1443 Last data filed at 01/09/15 1140  Gross per 24 hour  Intake    957 ml  Output   1100 ml  Net   -143 ml    PHYSICAL EXAMINATION:  GENERAL: 49 y.o.-year-old patient lying in the bed with no acute distress.  EYES: Pupils equal, round, reactive to light and accommodation. No scleral icterus. Extraocular muscles intact.  HEENT: Head atraumatic, normocephalic. Oropharynx and nasopharynx clear.  NECK: Supple, no jugular venous distention. No thyroid enlargement, no tenderness.  LUNGS: Normal breath sounds bilaterally, no wheezing, rales,rhonchi or crepitation. No use of accessory muscles of respiration.  CARDIOVASCULAR: S1, S2 normal. No murmurs, rubs, or gallops.  ABDOMEN: Soft, nontender, nondistended. Bowel sounds present. No organomegaly or mass. Scars of surgery present. EXTREMITIES: No pedal edema, cyanosis, or clubbing.  NEUROLOGIC: Cranial nerves II through XII are intact. Muscle strength 5/5 in all extremities. Sensation intact. Gait not checked.  PSYCHIATRIC: The patient is alert and oriented x 3.  SKIN: No obvious rash, lesion, or ulcer.   DATA REVIEW:   CBC  Recent Labs Lab 01/08/15 0653  WBC 9.7  HGB 11.7*  HCT 36.1  PLT 346    Chemistries   Recent Labs Lab 01/07/15 1309 01/08/15 0653  NA 139 141  K 3.5 3.3*  CL 100* 102  CO2 28 29  GLUCOSE 139* 109*  BUN 17 16  CREATININE 0.72 0.84  CALCIUM 8.5* 8.8*  MG  --  1.9  AST 19  --   ALT 22  --   ALKPHOS 61  --   BILITOT 0.5  --     Cardiac Enzymes No results for input(s): TROPONINI in the last 168 hours.  Microbiology Results  Results for orders placed or performed in visit on 12/11/13  Culture, blood (single)     Status: None   Collection Time: 12/10/13  7:46 AM  Result Value Ref Range Status   Micro Text Report   Final       SOURCE: first set right hand    COMMENT                   NO GROWTH AEROBICALLY/ANAEROBICALLY IN 5 DAYS   ANTIBIOTIC                                                       Culture, blood (single)     Status: None   Collection Time: 12/10/13  8:00 AM  Result Value Ref Range Status   Micro Text Report   Final       SOURCE: second set left hand1    COMMENT                   NO GROWTH AEROBICALLY/ANAEROBICALLY IN 5 DAYS   ANTIBIOTIC                                                        RADIOLOGY:  Ct Abdomen Pelvis W Contrast  01/07/2015   CLINICAL DATA:  Nausea, vomiting and diarrhea beginning this morning.  EXAM: CT ABDOMEN AND PELVIS WITH CONTRAST  TECHNIQUE: Multidetector CT imaging of the abdomen and pelvis was performed using the standard protocol following bolus administration of intravenous contrast.  CONTRAST:  100 cc Omnipaque 350.  COMPARISON:  CT abdomen and pelvis 08/23/2014.  FINDINGS: The lung bases are clear. No pleural or pericardial effusion. Hiatal hernia is noted.  The liver is low attenuating consistent with fatty infiltration. The patient is status post cholecystectomy. The adrenal glands, spleen, pancreas and kidneys appear normal.  The ventral hernia seen on the prior examination which had measured 2.7 cm transverse by 3.5 cm craniocaudal has enlarged now measuring 6.7 cm transverse by 6.9 cm craniocaudal. The hernia contains loops of small bowel but there is no evidence of small bowel obstruction or other complicating feature. The hernia extends along the right side of the abdomen where it contains fat which is infiltrated. There is infiltration of overlying subcutaneous fat of the anterior abdominal wall is well. Active inflammation is possible. No abscess is identified.  The  uterus and right ovary have been removed. The left ovary is unremarkable. Anastomosis is seen near the ileocecal junction. The colon is otherwise unremarkable. There is no evidence of bowel obstruction. No lymphadenopathy is identified.  No focal bony abnormality is seen.  IMPRESSION: Infiltration of subcutaneous fat and fat in the  right aspect of a ventral hernia could be due to scar given large abscess in this location on the prior study. The finding could also be due to panniculitis or possibly fat infarction. No abscess is identified.  Enlarged midline ventral hernia as described above.  Mild appearing fatty infiltration of the liver.  Small hiatal hernia.   Electronically Signed   By: Drusilla Kanner M.D.   On: 01/07/2015 18:32    EKG:   Orders placed or performed in visit on 04/04/14  . EKG 12-Lead      Management plans discussed with the patient, family and they are in agreement.  CODE STATUS:     Code Status Orders        Start     Ordered   01/07/15 1910  Full code   Continuous     01/07/15 1911      TOTAL TIME TAKING CARE OF THIS PATIENT: 35 minutes.    Altamese Dilling M.D on 01/09/2015 at 2:43 PM  Between 7am to 6pm - Pager - [phone]  After 6pm go to [email or web address] - password [email or web address] [facility]  [facility]  Office  [phone]  CC: Primary care physician; Clydie Braun, MD

## 2015-01-09 NOTE — Progress Notes (Signed)
Patient discharged to home, discharge instructions given as ordered. Patient to follow up at the pain clinic January 23, 2015. Patient ambulates without assistance. No acute distress noted. IV Saline lock discontinued, site without S/S of infection or infiltration.

## 2015-01-09 NOTE — Discharge Instructions (Signed)
Follow with PMD in 1 week. °

## 2015-01-09 NOTE — Care Management Important Message (Signed)
Important Message  Patient Details  Name: Daisy [name] MRN: [id number] Date of Birth: [birth year]   Medicare Important Message Given:  Yes-second notification given    Olegario Messier A Allmond 01/09/2015, 11:17 AM

## 2015-01-23 ENCOUNTER — Encounter: Payer: Self-pay | Admitting: Pain Medicine

## 2015-01-23 ENCOUNTER — Ambulatory Visit: Payer: Medicare Other | Attending: Pain Medicine | Admitting: Pain Medicine

## 2015-01-23 DIAGNOSIS — G588 Other specified mononeuropathies: Secondary | ICD-10-CM | POA: Insufficient documentation

## 2015-01-23 DIAGNOSIS — E1140 Type 2 diabetes mellitus with diabetic neuropathy, unspecified: Secondary | ICD-10-CM | POA: Insufficient documentation

## 2015-01-23 DIAGNOSIS — E1340 Other specified diabetes mellitus with diabetic neuropathy, unspecified: Secondary | ICD-10-CM

## 2015-01-23 DIAGNOSIS — M5481 Occipital neuralgia: Secondary | ICD-10-CM

## 2015-01-23 DIAGNOSIS — M170 Bilateral primary osteoarthritis of knee: Secondary | ICD-10-CM

## 2015-01-23 DIAGNOSIS — M5136 Other intervertebral disc degeneration, lumbar region: Secondary | ICD-10-CM

## 2015-01-23 DIAGNOSIS — M542 Cervicalgia: Secondary | ICD-10-CM | POA: Diagnosis present

## 2015-01-23 DIAGNOSIS — K2970 Gastritis, unspecified, without bleeding: Secondary | ICD-10-CM

## 2015-01-23 DIAGNOSIS — G43909 Migraine, unspecified, not intractable, without status migrainosus: Secondary | ICD-10-CM | POA: Insufficient documentation

## 2015-01-23 DIAGNOSIS — M47816 Spondylosis without myelopathy or radiculopathy, lumbar region: Secondary | ICD-10-CM | POA: Insufficient documentation

## 2015-01-23 DIAGNOSIS — M51369 Other intervertebral disc degeneration, lumbar region without mention of lumbar back pain or lower extremity pain: Secondary | ICD-10-CM

## 2015-01-23 DIAGNOSIS — M79601 Pain in right arm: Secondary | ICD-10-CM | POA: Diagnosis present

## 2015-01-23 DIAGNOSIS — G43109 Migraine with aura, not intractable, without status migrainosus: Secondary | ICD-10-CM

## 2015-01-23 DIAGNOSIS — M79602 Pain in left arm: Secondary | ICD-10-CM | POA: Diagnosis present

## 2015-01-23 MED ORDER — OXYCODONE HCL 5 MG PO TABS: 5.0000 mg | ORAL_TABLET | ORAL | Status: DC | PRN

## 2015-01-23 MED ORDER — OXYCODONE HCL 10 MG PO TABS: ORAL_TABLET | ORAL | Status: DC

## 2015-01-23 MED ORDER — TIZANIDINE HCL 4 MG PO CAPS: ORAL_CAPSULE | ORAL | Status: DC

## 2015-01-23 NOTE — Patient Instructions (Addendum)
Continue present medications oxycodone and Zanaflex  Greater occipital nerve block to be performed at time of return appointment  F/U PCP Dr. Sampson Goon for evaliation of  BP and general medical  condition  F/U surgical evaluation as planned  F/U neurological evaluation  F/U Dr.Su to discuss UDS results and for follow-up evaluation  May consider radiofrequency rhizolysis or intraspinal procedures pending response to present treatment and F/U evaluation   Patient to call [facility] should patient have concerns prior to scheduled return appointmen. Occipital Nerve Block Patient Information  Description: The occipital nerves originate in the cervical (neck) spinal cord and travel upward through muscle and tissue to supply sensation to the back of the head and top of the scalp.  In addition, the nerves control some of the muscles of the scalp.  Occipital neuralgia is an irritation of these nerves which can cause headaches, numbness of the scalp, and neck discomfort.     The occipital nerve block will interrupt nerve transmission through these nerves and can relieve pain and spasm.  The block consists of insertion of a small needle under the skin in the back of the head to deposit local anesthetic (numbing medicine) and/or steroids around the nerve.  The entire block usually lasts less than 5 minutes.  Conditions which may be treated by occipital blocks:   Muscular pain and spasm of the scalp  Nerve irritation, back of the head  Headaches  Upper neck pain  Preparation for the injection:  1. Do not eat any solid food or dairy products within 6 hours of your appointment. 2. You may drink clear liquids up to 2 hours before appointment.  Clear liquids include water, black coffee, juice or soda.  No milk or cream please. 3. You may take your regular medication, including pain medications, with a sip of water before you appointment.  Diabetics should hold regular insulin (if taken  separately) and take 1/2 normal NPH dose the morning of the procedure.  Carry some sugar containing items with you to your appointment. 4. A driver must accompany you and be prepared to drive you home after your procedure. 5. Bring all your current medications with you. 6. An IV may be inserted and sedation may be given at the discretion of the physician. 7. A blood pressure cuff, EKG, and other monitors will often be applied during the procedure.  Some patients may need to have extra oxygen administered for a short period. 8. You will be asked to provide medical information, including your allergies and medications, prior to the procedure.  We must know immediately if you are taking blood thinners (like Coumadin/Warfarin) or if you are allergic to IV iodine contrast (dye).  We must know if you could possible be pregnant.  9. Do not wear a high collared shirt or turtleneck.  Tie long hair up in the back if possible.  Possible side-effects:   Bleeding from needle site  Infection (rare, may require surgery)  Nerve injury (rare)  Hair on back of neck can be tinged with iodine scrub (this will wash out)  Light-headedness (temporary)  Pain at injection site (several days)  Decreased blood pressure (rare, temporary)  Seizure (very rare)  Call if you experience:   Hives or difficulty breathing ( go to the emergency room)  Inflammation or drainage at the injection site(s)  Please note:  Although the local anesthetic injected can often make your painful muscles or headache feel good for several hours after the injection, the  pain may return.  It takes 3-7 days for steroids to work.  You may not notice any pain relief for at least one week.  If effective, we will often do a series of injections spaced 3-6 weeks apart to maximally decrease your pain.  If you have any questions, please call [phone] [facility]

## 2015-01-23 NOTE — Progress Notes (Signed)
Pt states she was hospitalized the 58 th of July for 2 days.  Diagnosed with gastritis.  CT of abdomen done.  Patient fell yesterday.  States her leg gave out.  Did not seek treatment.

## 2015-01-23 NOTE — Progress Notes (Signed)
   Subjective:    Patient ID: Daisy [name], female    DOB: 1965/11/20, 49 y.o.   MRN: [id number]  HPI   Patient is 49 year old female [facility] for further evaluation and treatment of pain involving the neck upper extremity regions with pain in the neck radiating to the back of the head. Patient states that the headaches have been quite significant pain radiating from the back of the neck to the side of the head on the right side especially. Present time patient will continue to undergo further evaluation of her abdominal region. We discussed interventional treatment and will consider patient for greater occipital nerve block to be considered at time return appointment in attempt to decrease severity of pain involving the neck and headaches. The patient was understanding and in agreement status treatment plan.   Review of Systems     Objective:   Physical Exam  There was moderate moderately severe tends to palpation of the splenius capitis and occipitalis musculature region right greater than the left. There were no new lesions of the head and neck noted. There was tends to palpation of the temporomandibular joint region. No bounding pulsations of the temporal region were noted. There was reproduction of severe pain with palpation over the region of the splenius capitis and occipitalis musculature regions. Palpation over the thoracic facet thoracic paraspinal musculature region was with evidence of moderate discomfort with no crepitus of the thoracic region noted. There was tenderness over the region of the lumbar paraspinal musculature region lumbar facet region of moderate degree. Palpation over the PSIS and PII S region reproduced moderate discomfort. There was mild tennis over the greater trochanteric region and iliotibial band regions. Straight leg raising was limited to approximately 20 without a definite increase of pain with dorsiflexion noted. There was negative  clonus and negative Homans. Abdomen was with tenderness to palpation left side compared to the right side and no costovertebral tenderness was noted. There was tends to palpation of the knees with well-healed surgical scars of the knees crepitus of the knees and with negative anterior and posterior drawer signs there was no ballottement of the talus increased erythema of the knees noted    Assessment & Plan:     Degenerative disc disease of the lumbar spine Multilevel degenerative changes lumbar spine with L4-L5 level involving predominantly  Degenerative joint disease of knees Status post surgery of knees  Diabetes mellitus Diabetic neuropathy  Bilateral occipital neuralgia  Intercostal neuralgia  Migraine headaches    Plan   Continue present medications Zanaflex and oxycodone  F/U PCP Dr. Sampson Goon for evaliation of  BP and general medical  condition  F/U surgical evaluation. Patient will undergo further evaluation of abdominal region with consideration for surgical intervention of the abdominal region with history of mass on the left  F/U neurological evaluation  F/U Dr. Delbert Harness as discussed  F/U Dr. Raynald Kemp for evaluation and discussion of UDS results and for general follow-up evaluation  May consider radiofrequency rhizolysis or intraspinal procedures pending response to present treatment and F/U evaluation   Patient to call [facility] should patient have concerns prior to scheduled return appointmen.

## 2015-01-23 NOTE — Progress Notes (Signed)
Dr Metta Clines notified of uds report on chart.

## 2015-01-29 ENCOUNTER — Ambulatory Visit: Payer: Medicare Other | Attending: Pain Medicine | Admitting: Pain Medicine

## 2015-01-29 ENCOUNTER — Encounter: Payer: Self-pay | Admitting: Pain Medicine

## 2015-01-29 VITALS — BP 107/82 | HR 88 | Temp 98.0°F | Resp 20 | Ht 64.0 in | Wt 280.0 lb

## 2015-01-29 DIAGNOSIS — M5136 Other intervertebral disc degeneration, lumbar region: Secondary | ICD-10-CM

## 2015-01-29 DIAGNOSIS — M542 Cervicalgia: Secondary | ICD-10-CM | POA: Insufficient documentation

## 2015-01-29 DIAGNOSIS — E1340 Other specified diabetes mellitus with diabetic neuropathy, unspecified: Secondary | ICD-10-CM

## 2015-01-29 DIAGNOSIS — R51 Headache: Secondary | ICD-10-CM | POA: Insufficient documentation

## 2015-01-29 DIAGNOSIS — K2970 Gastritis, unspecified, without bleeding: Secondary | ICD-10-CM

## 2015-01-29 DIAGNOSIS — M5481 Occipital neuralgia: Secondary | ICD-10-CM

## 2015-01-29 DIAGNOSIS — M170 Bilateral primary osteoarthritis of knee: Secondary | ICD-10-CM

## 2015-01-29 DIAGNOSIS — G43109 Migraine with aura, not intractable, without status migrainosus: Secondary | ICD-10-CM

## 2015-01-29 MED ORDER — ORPHENADRINE CITRATE 30 MG/ML IJ SOLN
INTRAMUSCULAR | Status: AC
  Administered 2015-01-29: 60 mg
  Filled 2015-01-29: qty 2

## 2015-01-29 MED ORDER — MIDAZOLAM HCL 5 MG/5ML IJ SOLN
INTRAMUSCULAR | Status: AC
  Administered 2015-01-29: 4 mg via INTRAVENOUS
  Filled 2015-01-29: qty 5

## 2015-01-29 MED ORDER — TRIAMCINOLONE ACETONIDE 40 MG/ML IJ SUSP
INTRAMUSCULAR | Status: AC
Start: 2015-01-29 — End: 2015-01-29
  Administered 2015-01-29: 10 mg
  Filled 2015-01-29: qty 1

## 2015-01-29 MED ORDER — FENTANYL CITRATE (PF) 100 MCG/2ML IJ SOLN
INTRAMUSCULAR | Status: AC
  Administered 2015-01-29: 100 ug via INTRAVENOUS
  Filled 2015-01-29: qty 2

## 2015-01-29 MED ORDER — BUPIVACAINE HCL (PF) 0.25 % IJ SOLN
INTRAMUSCULAR | Status: AC
  Administered 2015-01-29: 30 mL
  Filled 2015-01-29: qty 30

## 2015-01-29 NOTE — Progress Notes (Signed)
Safety precautions to be maintained throughout the outpatient stay will include: orient to surroundings, keep bed in low position, maintain call bell within reach at all times, provide assistance with transfer out of bed and ambulation.  

## 2015-01-29 NOTE — Patient Instructions (Addendum)
Continue present medications Zanaflex and oxycodone  F/U PCP Dr. Sampson Goon for evaliation of  BP and general medical  condition  F/U surgical evaluation as discussed  F/U neurological evaluation of headaches and general neurological evaluation  May consider radiofrequency rhizolysis or intraspinal procedures pending response to present treatment and F/U evaluation   Patient to call Pain Management Center should patient have concerns prior to scheduled return appointmen. Pain Management Discharge Instructions  General Discharge Instructions :  If you need to reach your doctor call: Monday-Friday 8:00 am - 4:00 pm at [phone] or toll free [phone].  After clinic hours [phone] to have operator reach doctor.  Bring all of your medication bottles to all your appointments in the pain clinic.  To cancel or reschedule your appointment with Pain Management please remember to call 24 hours in advance to avoid a fee.  Refer to the educational materials which you have been given on: General Risks, I had my Procedure. Discharge Instructions, Post Sedation.  Post Procedure Instructions:  The drugs you were given will stay in your system until tomorrow, so for the next 24 hours you should not drive, make any legal decisions or drink any alcoholic beverages.  You may eat anything you prefer, but it is better to start with liquids then soups and crackers, and gradually work up to solid foods.  Please notify your doctor immediately if you have any unusual bleeding, trouble breathing or pain that is not related to your normal pain.  Depending on the type of procedure that was done, some parts of your body may feel week and/or numb.  This usually clears up by tonight or the next day.  Walk with the use of an assistive device or accompanied by an adult for the 24 hours.  You may use ice on the affected area for the first 24 hours.  Put ice in a Ziploc bag and cover with a towel and place  against area 15 minutes on 15 minutes off.  You may switch to heat after 24 hours.GENERAL RISKS AND COMPLICATIONS  What are the risk, side effects and possible complications? Generally speaking, most procedures are safe.  However, with any procedure there are risks, side effects, and the possibility of complications.  The risks and complications are dependent upon the sites that are lesioned, or the type of nerve block to be performed.  The closer the procedure is to the spine, the more serious the risks are.  Great care is taken when placing the radio frequency needles, block needles or lesioning probes, but sometimes complications can occur. 1. Infection: Any time there is an injection through the skin, there is a risk of infection.  This is why sterile conditions are used for these blocks.  There are four possible types of infection. 1. Localized skin infection. 2. Central Nervous System Infection-This can be in the form of Meningitis, which can be deadly. 3. Epidural Infections-This can be in the form of an epidural abscess, which can cause pressure inside of the spine, causing compression of the spinal cord with subsequent paralysis. This would require an emergency surgery to decompress, and there are no guarantees that the patient would recover from the paralysis. 4. Discitis-This is an infection of the intervertebral discs.  It occurs in about 1% of discography procedures.  It is difficult to treat and it may lead to surgery.        2. Pain: the needles have to go through skin and soft tissues, will cause soreness.  3. Damage to internal structures:  The nerves to be lesioned may be near blood vessels or    other nerves which can be potentially damaged.       4. Bleeding: Bleeding is more common if the patient is taking blood thinners such as  aspirin, Coumadin, Ticiid, Plavix, etc., or if he/she have some genetic predisposition  such as hemophilia. Bleeding into the spinal canal can cause  compression of the spinal  cord with subsequent paralysis.  This would require an emergency surgery to  decompress and there are no guarantees that the patient would recover from the  paralysis.       5. Pneumothorax:  Puncturing of a lung is a possibility, every time a needle is introduced in  the area of the chest or upper back.  Pneumothorax refers to free air around the  collapsed lung(s), inside of the thoracic cavity (chest cavity).  Another two possible  complications related to a similar event would include: Hemothorax and Chylothorax.   These are variations of the Pneumothorax, where instead of air around the collapsed  lung(s), you may have blood or chyle, respectively.       6. Spinal headaches: They may occur with any procedures in the area of the spine.       7. Persistent CSF (Cerebro-Spinal Fluid) leakage: This is a rare problem, but may occur  with prolonged intrathecal or epidural catheters either due to the formation of a fistulous  track or a dural tear.       8. Nerve damage: By working so close to the spinal cord, there is always a possibility of  nerve damage, which could be as serious as a permanent spinal cord injury with  paralysis.       9. Death:  Although rare, severe deadly allergic reactions known as "Anaphylactic  reaction" can occur to any of the medications used.      10. Worsening of the symptoms:  We can always make thing worse.  What are the chances of something like this happening? Chances of any of this occuring are extremely low.  By statistics, you have more of a chance of getting killed in a motor vehicle accident: while driving to the hospital than any of the above occurring .  Nevertheless, you should be aware that they are possibilities.  In general, it is similar to taking a shower.  Everybody knows that you can slip, hit your head and get killed.  Does that mean that you should not shower again?  Nevertheless always keep in mind that statistics do not mean  anything if you happen to be on the wrong side of them.  Even if a procedure has a 1 (one) in a 1,000,000 (million) chance of going wrong, it you happen to be that one..Also, keep in mind that by statistics, you have more of a chance of having something go wrong when taking medications.  Who should not have this procedure? If you are on a blood thinning medication (e.g. Coumadin, Plavix, see list of "Blood Thinners"), or if you have an active infection going on, you should not have the procedure.  If you are taking any blood thinners, please inform your physician.  How should I prepare for this procedure?  Do not eat or drink anything at least six hours prior to the procedure.  Bring a driver with you .  It cannot be a taxi.  Come accompanied by an adult that can drive you back, and  that is strong enough to help you if your legs get weak or numb from the local anesthetic.  Take all of your medicines the morning of the procedure with just enough water to swallow them.  If you have diabetes, make sure that you are scheduled to have your procedure done first thing in the morning, whenever possible.  If you have diabetes, take only half of your insulin dose and notify our nurse that you have done so as soon as you arrive at the clinic.  If you are diabetic, but only take blood sugar pills (oral hypoglycemic), then do not take them on the morning of your procedure.  You may take them after you have had the procedure.  Do not take aspirin or any aspirin-containing medications, at least eleven (11) days prior to the procedure.  They may prolong bleeding.  Wear loose fitting clothing that may be easy to take off and that you would not mind if it got stained with Betadine or blood.  Do not wear any jewelry or perfume  Remove any nail coloring.  It will interfere with some of our monitoring equipment.  NOTE: Remember that this is not meant to be interpreted as a complete list of all possible  complications.  Unforeseen problems may occur.  BLOOD THINNERS The following drugs contain aspirin or other products, which can cause increased bleeding during surgery and should not be taken for 2 weeks prior to and 1 week after surgery.  If you should need take something for relief of minor pain, you may take acetaminophen which is found in Tylenol,m Datril, Anacin-3 and Panadol. It is not blood thinner. The products listed below are.  Do not take any of the products listed below in addition to any listed on your instruction sheet.  A.P.C or A.P.C with Codeine Codeine Phosphate Capsules #3 Ibuprofen Ridaura  ABC compound Congesprin Imuran rimadil  Advil Cope Indocin Robaxisal  Alka-Seltzer Effervescent Pain Reliever and Antacid Coricidin or Coricidin-D  Indomethacin Rufen  Alka-Seltzer plus Cold Medicine Cosprin Ketoprofen S-A-C Tablets  Anacin Analgesic Tablets or Capsules Coumadin Korlgesic Salflex  Anacin Extra Strength Analgesic tablets or capsules CP-2 Tablets Lanoril Salicylate  Anaprox Cuprimine Capsules Levenox Salocol  Anexsia-D Dalteparin Magan Salsalate  Anodynos Darvon compound Magnesium Salicylate Sine-off  Ansaid Dasin Capsules Magsal Sodium Salicylate  Anturane Depen Capsules Marnal Soma  APF Arthritis pain formula Dewitt's Pills Measurin Stanback  Argesic Dia-Gesic Meclofenamic Sulfinpyrazone  Arthritis Bayer Timed Release Aspirin Diclofenac Meclomen Sulindac  Arthritis pain formula Anacin Dicumarol Medipren Supac  Analgesic (Safety coated) Arthralgen Diffunasal Mefanamic Suprofen  Arthritis Strength Bufferin Dihydrocodeine Mepro Compound Suprol  Arthropan liquid Dopirydamole Methcarbomol with Aspirin Synalgos  ASA tablets/Enseals Disalcid Micrainin Tagament  Ascriptin Doan's Midol Talwin  Ascriptin A/D Dolene Mobidin Tanderil  Ascriptin Extra Strength Dolobid Moblgesic Ticlid  Ascriptin with Codeine Doloprin or Doloprin with Codeine Momentum Tolectin  Asperbuf Duoprin  Mono-gesic Trendar  Aspergum Duradyne Motrin or Motrin IB Triminicin  Aspirin plain, buffered or enteric coated Durasal Myochrisine Trigesic  Aspirin Suppositories Easprin Nalfon Trillsate  Aspirin with Codeine Ecotrin Regular or Extra Strength Naprosyn Uracel  Atromid-S Efficin Naproxen Ursinus  Auranofin Capsules Elmiron Neocylate Vanquish  Axotal Emagrin Norgesic Verin  Azathioprine Empirin or Empirin with Codeine Normiflo Vitamin E  Azolid Emprazil Nuprin Voltaren  Bayer Aspirin plain, buffered or children's or timed BC Tablets or powders Encaprin Orgaran Warfarin Sodium  Buff-a-Comp Enoxaparin Orudis Zorpin  Buff-a-Comp with Codeine Equegesic Os-Cal-Gesic   Buffaprin Excedrin plain, buffered  or Extra Strength Oxalid   Bufferin Arthritis Strength Feldene Oxphenbutazone   Bufferin plain or Extra Strength Feldene Capsules Oxycodone with Aspirin   Bufferin with Codeine Fenoprofen Fenoprofen Pabalate or Pabalate-SF   Buffets II Flogesic Panagesic   Buffinol plain or Extra Strength Florinal or Florinal with Codeine Panwarfarin   Buf-Tabs Flurbiprofen Penicillamine   Butalbital Compound Four-way cold tablets Penicillin   Butazolidin Fragmin Pepto-Bismol   Carbenicillin Geminisyn Percodan   Carna Arthritis Reliever Geopen Persantine   Carprofen Gold's salt Persistin   Chloramphenicol Goody's Phenylbutazone   Chloromycetin Haltrain Piroxlcam   Clmetidine heparin Plaquenil   Cllnoril Hyco-pap Ponstel   Clofibrate Hydroxy chloroquine Propoxyphen         Before stopping any of these medications, be sure to consult the physician who ordered them.  Some, such as Coumadin (Warfarin) are ordered to prevent or treat serious conditions such as "deep thrombosis", "pumonary embolisms", and other heart problems.  The amount of time that you may need off of the medication may also vary with the medication and the reason for which you were taking it.  If you are taking any of these medications, please  make sure you notify your pain physician before you undergo any procedures.

## 2015-01-29 NOTE — Progress Notes (Signed)
   Subjective:    Patient ID: Daisy [name], female    DOB: 05/16/1966, 49 y.o.   MRN: [id number]  HPI  NOTE: The patient is a 49 y.o.-year-old female who returns to the Pain Management Center for further evaluation and treatment of pain consisting of pain involving the region of the neck and headache.  Patient is with prior history of headaches with pain radiating from the back of the neck to the back of the head as well as component of migraine headache At the present time patient appears to be with significant component of greater occipital neuralgia and myofascial pain related headaches .  The risks, benefits, and expectations of the procedure have been discussed and explained to patient, who is understanding and wishes to proceed with interventional treatment as discussed and as explained to patient.  Will proceed with greater occipital nerve blocks with myoneural block injections at this time as discussed and as explained to patient.  All are understanding and in agreement with suggested treatment plan.    PROCEDURE:  Greater occipital nerve block on the left side with IV Versed, IV Fentanyl, conscious sedation, EKG, blood pressure, pulse, pulse oximetry monitoring.  Procedure performed with patient in prone position.  Greater occipital nerve block on the left side.   With patient in prone position, Betadine prep of proposed entry site accomplished.  Following identification of the nuchal ridge, 22 -gauge needle was inserted at the level of the nuchal ridge medial to the occipital artery.  Following negative aspiration, 4cc 0.25% bupivacaine with Kenalog injected for left greater occipital nerve block.  Needle was removed.  Patient tolerated injection well.   Greater occipital nerve block on the rightt side. The greater occipital nerve block on the right side was performed exactly as the left greater occipital nerve block was performed and utilizing the same technique.  Myoneural blocks of the  cervical region Following Betadine prep proposed entry site a 22-gauge needle was inserted in the cervical musculature region and following negative aspiration 2 cc of 0.25% bupivacaine with Norflex was injected for myoneural block injections cervical region 4   A total of 10 mg Kenalog was utilized for the entire procedure.  PLAN:    1. Medications: Will continue presently prescribed medications Zanaflex and oxycodone at this time. 2. Patient to follow up with primary care physician Dr. Sampson Goon for evaluation of blood pressure and general medical condition status post procedure performed on today's visit. 3. Neurological evaluation for further assessment of headaches for further studies as discussed. 4. Surgical evaluation as discussed and as planned  5. Patient may be candidate for Botox injections, radiofrequency procedures, as well as implantation type procedures pending response to treatment rendered on today's visit and pending follow-up evaluation. 6. Patient has been advised to adhere to proper body mechanics and to avoid activities which appear to aggravate condition.cations:  Will continue presently prescribed medications at this time. 7. The patient is understanding and in agreement with the suggested treatment plan.     Review of Systems     Objective:   Physical Exam        Assessment & Plan:

## 2015-01-30 ENCOUNTER — Telehealth: Payer: Self-pay | Admitting: *Deleted

## 2015-01-30 NOTE — Telephone Encounter (Signed)
Message left

## 2015-02-27 ENCOUNTER — Encounter: Payer: Self-pay | Admitting: Pain Medicine

## 2015-02-27 ENCOUNTER — Ambulatory Visit: Payer: Medicare Other | Attending: Pain Medicine | Admitting: Pain Medicine

## 2015-02-27 VITALS — BP 143/84 | HR 73 | Temp 98.7°F | Resp 18 | Ht 64.0 in | Wt 280.0 lb

## 2015-02-27 DIAGNOSIS — Z9889 Other specified postprocedural states: Secondary | ICD-10-CM | POA: Diagnosis not present

## 2015-02-27 DIAGNOSIS — M542 Cervicalgia: Secondary | ICD-10-CM | POA: Diagnosis present

## 2015-02-27 DIAGNOSIS — M47816 Spondylosis without myelopathy or radiculopathy, lumbar region: Secondary | ICD-10-CM | POA: Diagnosis not present

## 2015-02-27 DIAGNOSIS — G588 Other specified mononeuropathies: Secondary | ICD-10-CM | POA: Diagnosis not present

## 2015-02-27 DIAGNOSIS — M5136 Other intervertebral disc degeneration, lumbar region: Secondary | ICD-10-CM | POA: Diagnosis not present

## 2015-02-27 DIAGNOSIS — M5481 Occipital neuralgia: Secondary | ICD-10-CM | POA: Diagnosis not present

## 2015-02-27 DIAGNOSIS — M170 Bilateral primary osteoarthritis of knee: Secondary | ICD-10-CM | POA: Diagnosis not present

## 2015-02-27 DIAGNOSIS — E1140 Type 2 diabetes mellitus with diabetic neuropathy, unspecified: Secondary | ICD-10-CM | POA: Diagnosis not present

## 2015-02-27 DIAGNOSIS — E1340 Other specified diabetes mellitus with diabetic neuropathy, unspecified: Secondary | ICD-10-CM

## 2015-02-27 DIAGNOSIS — G43009 Migraine without aura, not intractable, without status migrainosus: Secondary | ICD-10-CM

## 2015-02-27 DIAGNOSIS — R51 Headache: Secondary | ICD-10-CM | POA: Diagnosis present

## 2015-02-27 DIAGNOSIS — G43109 Migraine with aura, not intractable, without status migrainosus: Secondary | ICD-10-CM

## 2015-02-27 MED ORDER — TIZANIDINE HCL 4 MG PO CAPS: ORAL_CAPSULE | ORAL | Status: DC

## 2015-02-27 MED ORDER — OXYCODONE HCL 10 MG PO TABS: ORAL_TABLET | ORAL | Status: DC

## 2015-02-27 NOTE — Patient Instructions (Addendum)
PLAN   Continue present medication Zanaflex and oxycodone  F/U PCP Dr. Sampson Goon for evaliation of  BP and general medical  condition  F/U surgical evaluation as planned at [facility]  F/U neurological evaluation. May consider pending follow-up evaluations  F/U  Dr  Stevphen Rochester Su as planned  May consider radiofrequency rhizolysis or intraspinal procedures pending response to present treatment and F/U evaluation   Patient to call Pain Management Center should patient have concerns prior to scheduled return appointment.

## 2015-02-27 NOTE — Progress Notes (Signed)
Subjective:    Patient ID: Daisy [name], female    DOB: 1965/10/28, 49 y.o.   MRN: [id number]  HPI Patient is 49 year old female returns to pain management for further evaluation and treatment of pain involving the neck with headaches as well as the upper extremity regions lower back and lower extremity region. Patient states she is doing remarkably well following greater occipital nerve block for treatment of headache. Patient has been without headaches since the procedure. Patient is with pain of the lower back lower extremity region is to undergo further surgical evaluation of her abdominal pain. We discussed patient's condition on today's visit and will continue Tylenol tizanidine and oxycodone. We will remain available to consider modification of treatment pending follow-up evaluation. The patient was understanding and in agreement with suggested treatment plan. Patient denies recent trauma change in events of daily living the call significant change in symptoms we will continue present medications as discussed and remain available to consider modifications of treatment pending follow-up evaluation     PHYSICAL EXAMINATION    Palpation over the cervical facet cervical paraspinal musculature region reproduced mild discomfort. There was no masses of the head and neck noted. There was minimal tenderness over the splenius capitis and occipitalis musculature region as well as the cervical facet cervical paraspinal musculature region of the thoracic facet thoracic paraspinal musculature region. Patient appeared to be with slightly decreased grip strength. Tinel and Phalen's maneuver were without increase of pain of significant degree. Palpation over the thoracic facet thoracic paraspinal musculature region was a tends to palpation with no crepitus of the thoracic region noted. There appeared to be tends to palpation over the lumbar paraspinal musculature region lumbar facet region of moderate  degree. Lateral bending and rotation and extension and palpation of the lumbar facets reproduce moderate discomfort. With moderate tends to palpation over the PSIS and PII S regions as well as the gluteal and piriformis musculature regions. Leg raising was tolerates approximately 20 without definite increased pain with dorsiflexion noted. DTRs were difficult to elicit. There were well-healed scars of the knees without increased warmth erythema in the region scar. There appeared to be negative clonus negative Homans. There was tenderness over the PSIS and PII S regions   of moderate degree.. The abdomen was with moderate tends to palpation and no costovertebral tenderness was noted.     Review of Systems     Objective:   Physical Exam  (please see physical examination above)       Assessment & Plan:     Degenerative disc disease of the lumbar spine Multilevel degenerative changes lumbar spine with L4-L5 level involving predominantly  Degenerative joint disease of knees Status post surgery of knees  Diabetes mellitus Diabetic neuropathy  Bilateral occipital neuralgia  Intercostal neuralgia      PLAN   Continue present medication Zanaflex and oxycodone  F/U PCP Dr. Sampson Goon for evaliation of  BP and general medical  condition  F/U surgical evaluation. . The patient is to undergo surgical reevaluation at [facility] as planned. Patient is being considered for surgical intervention of the abdominal region with prior history of surgical intervention of the abdominal region  F/U surgical evaluation of pain of the lumbar and lower extremity regions and knees as discussed  F/U Dr.Su as planned   F/U neurological evaluation. May consider pending follow-up evaluations  May consider radiofrequency rhizolysis or intraspinal procedures pending response to present treatment and F/U evaluation   Patient to call Pain Management  Center should patient have concerns prior to  scheduled return appointment.

## 2015-02-27 NOTE — Progress Notes (Signed)
Safety precautions to be maintained throughout the outpatient stay will include: orient to surroundings, keep bed in low position, maintain call bell within reach at all times, provide assistance with transfer out of bed and ambulation.  

## 2015-03-27 ENCOUNTER — Ambulatory Visit: Payer: Medicare Other | Admitting: Pain Medicine

## 2015-03-27 ENCOUNTER — Emergency Department: Payer: Medicare Other

## 2015-03-27 ENCOUNTER — Emergency Department
Admission: EM | Admit: 2015-03-27 | Discharge: 2015-03-27 | Disposition: A | Discharge status: Other Acute Inpatient Hospital | Payer: Medicare Other | Attending: Emergency Medicine | Admitting: Emergency Medicine

## 2015-03-27 VITALS — BP 115/90 | HR 79 | Temp 99.7°F | Resp 16 | Ht 69.0 in | Wt 289.0 lb

## 2015-03-27 DIAGNOSIS — T50991A Poisoning by other drugs, medicaments and biological substances, accidental (unintentional), initial encounter: Secondary | ICD-10-CM | POA: Diagnosis not present

## 2015-03-27 DIAGNOSIS — G4700 Insomnia, unspecified: Secondary | ICD-10-CM | POA: Insufficient documentation

## 2015-03-27 DIAGNOSIS — Y9289 Other specified places as the place of occurrence of the external cause: Secondary | ICD-10-CM | POA: Diagnosis not present

## 2015-03-27 DIAGNOSIS — Y998 Other external cause status: Secondary | ICD-10-CM | POA: Insufficient documentation

## 2015-03-27 DIAGNOSIS — E0840 Diabetes mellitus due to underlying condition with diabetic neuropathy, unspecified: Secondary | ICD-10-CM | POA: Insufficient documentation

## 2015-03-27 DIAGNOSIS — K439 Ventral hernia without obstruction or gangrene: Secondary | ICD-10-CM | POA: Diagnosis not present

## 2015-03-27 DIAGNOSIS — M5481 Occipital neuralgia: Secondary | ICD-10-CM

## 2015-03-27 DIAGNOSIS — T50901A Poisoning by unspecified drugs, medicaments and biological substances, accidental (unintentional), initial encounter: Secondary | ICD-10-CM

## 2015-03-27 DIAGNOSIS — N179 Acute kidney failure, unspecified: Secondary | ICD-10-CM | POA: Diagnosis not present

## 2015-03-27 DIAGNOSIS — Z792 Long term (current) use of antibiotics: Secondary | ICD-10-CM | POA: Diagnosis not present

## 2015-03-27 DIAGNOSIS — M5136 Other intervertebral disc degeneration, lumbar region: Secondary | ICD-10-CM

## 2015-03-27 DIAGNOSIS — I10 Essential (primary) hypertension: Secondary | ICD-10-CM | POA: Insufficient documentation

## 2015-03-27 DIAGNOSIS — Y9389 Activity, other specified: Secondary | ICD-10-CM | POA: Diagnosis not present

## 2015-03-27 DIAGNOSIS — R4182 Altered mental status, unspecified: Secondary | ICD-10-CM | POA: Diagnosis present

## 2015-03-27 DIAGNOSIS — Z79899 Other long term (current) drug therapy: Secondary | ICD-10-CM | POA: Insufficient documentation

## 2015-03-27 DIAGNOSIS — F419 Anxiety disorder, unspecified: Secondary | ICD-10-CM | POA: Insufficient documentation

## 2015-03-27 DIAGNOSIS — G43009 Migraine without aura, not intractable, without status migrainosus: Secondary | ICD-10-CM

## 2015-03-27 DIAGNOSIS — K2970 Gastritis, unspecified, without bleeding: Secondary | ICD-10-CM

## 2015-03-27 DIAGNOSIS — E1340 Other specified diabetes mellitus with diabetic neuropathy, unspecified: Secondary | ICD-10-CM

## 2015-03-27 DIAGNOSIS — M170 Bilateral primary osteoarthritis of knee: Secondary | ICD-10-CM

## 2015-03-27 DIAGNOSIS — G43109 Migraine with aura, not intractable, without status migrainosus: Secondary | ICD-10-CM

## 2015-03-27 LAB — CBC WITH DIFFERENTIAL/PLATELET
Basophils Absolute: 0 10*3/uL (ref 0–0.1)
Basophils Relative: 0 %
EOS ABS: 0.1 10*3/uL (ref 0–0.7)
EOS PCT: 1 %
HCT: 35 % (ref 35.0–47.0)
Hemoglobin: 11.2 g/dL — ABNORMAL LOW (ref 12.0–16.0)
LYMPHS ABS: 1.8 10*3/uL (ref 1.0–3.6)
LYMPHS PCT: 12 %
MCH: 26.8 pg (ref 26.0–34.0)
MCHC: 31.9 g/dL — AB (ref 32.0–36.0)
MCV: 84.2 fL (ref 80.0–100.0)
MONO ABS: 0.9 10*3/uL (ref 0.2–0.9)
MONOS PCT: 6 %
Neutro Abs: 12.7 10*3/uL — ABNORMAL HIGH (ref 1.4–6.5)
Neutrophils Relative %: 81 %
PLATELETS: 327 10*3/uL (ref 150–440)
RBC: 4.16 MIL/uL (ref 3.80–5.20)
RDW: 16.6 % — ABNORMAL HIGH (ref 11.5–14.5)
WBC: 15.5 10*3/uL — AB (ref 3.6–11.0)

## 2015-03-27 LAB — BASIC METABOLIC PANEL
Anion gap: 8 (ref 5–15)
BUN: 24 mg/dL — AB (ref 6–20)
CO2: 28 mmol/L (ref 22–32)
CREATININE: 1.7 mg/dL — AB (ref 0.44–1.00)
Calcium: 8.5 mg/dL — ABNORMAL LOW (ref 8.9–10.3)
Chloride: 96 mmol/L — ABNORMAL LOW (ref 101–111)
GFR calc Af Amer: 40 mL/min — ABNORMAL LOW (ref >60)
GFR, EST NON AFRICAN AMERICAN: 35 mL/min — AB (ref >60)
GLUCOSE: 128 mg/dL — AB (ref 65–99)
POTASSIUM: 4.7 mmol/L (ref 3.5–5.1)
SODIUM: 132 mmol/L — AB (ref 135–145)

## 2015-03-27 LAB — TROPONIN I: Troponin I: <0.03 ng/mL (ref <0.031)

## 2015-03-27 LAB — GLUCOSE, CAPILLARY: Glucose-Capillary: 87 mg/dL (ref 65–99)

## 2015-03-27 MED ORDER — NALOXONE HCL 1 MG/ML IJ SOLN
0.4000 mg | INTRAMUSCULAR | Status: DC | PRN
  Administered 2015-03-27: 1 mg via INTRAVENOUS
  Administered 2015-03-27 (×2): 0.4 mg via INTRAVENOUS
  Filled 2015-03-27: qty 2

## 2015-03-27 MED ORDER — SODIUM CHLORIDE 0.9 % IV BOLUS (SEPSIS)
1000.0000 mL | Freq: Once | INTRAVENOUS | Status: AC
  Administered 2015-03-27: 1000 mL via INTRAVENOUS

## 2015-03-27 MED ORDER — ONDANSETRON HCL 4 MG/2ML IJ SOLN
INTRAMUSCULAR | Status: AC
  Administered 2015-03-27: 4 mg via INTRAVENOUS
  Filled 2015-03-27: qty 2

## 2015-03-27 MED ORDER — ONDANSETRON HCL 4 MG/2ML IJ SOLN
4.0000 mg | Freq: Once | INTRAMUSCULAR | Status: AC
  Administered 2015-03-27: 4 mg via INTRAVENOUS

## 2015-03-27 NOTE — Progress Notes (Signed)
Pt came in incoherent and not able to answer questions. Vs taken; patient responds to name only then falls back to sleep; unable to do assessment. O2 sats dropping from 87 to 83; Portable O2  At 3 lpm; patient in a wheelchair and taken to the ER with assistance  Of Dahlia Bailiffenise Kennedy, RN. Dr Metta Clinesrisp into see patient. Patient going to the ER; report given to nurse at the desk. Patient's sister was taken to ER to be with the patient.

## 2015-03-27 NOTE — Progress Notes (Signed)
Subjective:    Patient ID: Juanetta Gosling, female    DOB: 1966-01-02, 49 y.o.   MRN: [id number]  HPI The patient is 49 year old female who comes to pain management Center for further evaluation and treatment of pain which involved headaches cervical thoracic lumbar upper and lower extremity regions. The patient presented to the pain clinic seen sitting in wheelchair and in a rather somnolent state. The patient was with inability to provide coherent and accurate history. After evaluation of patient and assessment of patient's condition and decision was made to have patient transported to the emergency department for further evaluation and treatment. The patient and patient's accompanying undergo both reported that patient had been with continued pain of the abdominal region in the region of previous surgery. The patient also admitted to some swelling of the abdominal region. The patient denied any other significantly limiting complaints of pain on today's visit and directed all attention to her, region. The patient denied consumption of any additional medication or substances. . The patient did admit to falling earlier today prior to coming to the Pain Management Center. The patient was willing to proceed with transported to the emergency department for further assessment and treatment of her condition.   Review of Systems     Objective:   Physical Exam  There was tends to palpation of paraspinal muscles region cervical and cervical facet region. Palpation of the splenius capitis of talus regions reproduced mild discomfort. There was mild tenderness of the acromioclavicular and glenohumeral joint region. There appeared to be unremarkable Spurling's maneuver. Patient was with bilaterally equal grip strength and Tinel and Phalen's maneuver were without increase of pain of significant degree. Palpation over the thoracic paraspinal motions thoracic facet region was with tinged palpation of mild to  moderate degree with no crepitus of the thoracic region noted. Palpation of the lumbar paraspinal muscles lumbar facet region was with moderate tends to palpation. There was mild tenderness of the greater trochanteric region and iliotibial band region. Rates 20 without increased pain with dorsiflexion noted. EHL strength was decreased. There appeared to be decreased sensation of the lower extremities in a stocking type distribution. Examination of the knees revealed well-healed scars of the knees there was negative anterior and posterior drawer signs with ballottement of the patella. There was crepitus of the knees were noted. No increased laxity of joints were noted. EHL strength appeared to be decreased. There was tennis to palpation of the greater trochanteric region and iliotibial band region . Abdomen was protuberant and with tenderness to palpation of the abdomen in the mid and lower quadrants especially. No costovertebral angle tenderness noted.      Assessment & Plan:  Abdominal pain Status post abdominal surgery  Questionable pneumonia  Degenerative disc disease of the lumbar spine Multilevel degenerative changes lumbar spine with L4-L5 level involving predominantly  Degenerative joint disease of knees Status post surgery of knees  Diabetes mellitus Diabetic neuropathy  Bilateral occipital neuralgia  Intercostal neuralgia     PLAN   Continue present medications . Please note patient was not given any prescriptions for medications on today's visit  F/U PCP Dr. Sampson Goon for evaliation of present condition BP diabetes mellitus and general medical  condition  F/U surgical evaluation. The patient is status post abdominal surgery is to undergo follow-up evaluation   F/U neurological evaluation as discussed. At the present time patient will be evaluated in the emergency department including studies of the brain and will undergo neurological evaluation as well  F/U psych  evaluation with Dr. Janeece RiggersSu . Patient also is to undergo psych assessment while in the emergency department as well as urine drug screen  UDS  patient is to undergo urine drug screen in the emergency department. We discussed obtaining urine drug screen today and decision was made to proceed with transporting patient to emergency department for further studies and evaluation  May consider radiofrequency rhizolysis or intraspinal procedures pending response to present treatment and F/U evaluation . We will avoid considering such treatment at this time  Patient to call Pain Management Center should patient have concerns prior to scheduled return appointment.

## 2015-03-27 NOTE — ED Notes (Signed)
[facility], on cleaning the room , 3 gold colored earrings found in a denture cup , name tag applied to cup and place at charge desk

## 2015-03-27 NOTE — ED Provider Notes (Signed)
[facility] Emergency Department Provider Note REMINDER - THIS NOTE IS NOT A FINAL MEDICAL RECORD UNTIL IT IS SIGNED. UNTIL THEN, THE CONTENT BELOW MAY REFLECT INFORMATION FROM A DOCUMENTATION TEMPLATE, NOT THE ACTUAL PATIENT VISIT. ____________________________________________  Time seen: Approximately 1025 PM  I have reviewed the triage vital signs and the nursing notes.   HISTORY  Chief Complaint Altered Mental Status  EM caveat: Patient with confusion and altered mental status.   HPI Daisy [name] is a 49 y.o. female the history of diabetes, hypertension, multiple abdominal surgeries. She presents today from the pain clinic where she came in somewhat unresponsive. She was brought to the emergency room for further evaluation.  Per the patient's sister, the patient has been having pain from her hernia site and has been taking pain medication for this. She may have been using slightly more than prescribed, but it is not clearly known.     Past Medical History  Diagnosis Date  . Diabetes mellitus without complication   . Depression   . Hypertension   . Anxiety   . GERD (gastroesophageal reflux disease)   . Migraine     Patient Active Problem List   Diagnosis Date Noted  . Gastritis 01/07/2015  . DDD (degenerative disc disease), lumbar 11/28/2014  . DJD (degenerative joint disease) of knee 11/28/2014  . Migraine headache 11/28/2014  . Bilateral occipital neuralgia 11/28/2014  . Morbid obesity (HCC) 11/28/2014  . Neuropathy due to secondary diabetes (HCC) 11/28/2014    Past Surgical History  Procedure Laterality Date  . Hernia repair    . Appendectomy    . Cholecystectomy    . Knee surgery      Current Outpatient Rx  Name  Route  Sig  Dispense  Refill  . alprazolam (XANAX) 2 MG tablet   Oral   Take 2 mg by mouth 3 (three) times daily as needed for anxiety.          . Butalbital-APAP-Caffeine 50-300-40 MG CAPS   Oral   Take 1  capsule by mouth every 6 (six) hours as needed (for headaches).          . doxycycline (DORYX) 100 MG DR capsule   Oral   Take 100 mg by mouth 2 (two) times daily.         Marland Kitchen. FLUoxetine (PROZAC) 20 MG capsule   Oral   Take 20 mg by mouth daily.         . furosemide (LASIX) 20 MG tablet   Oral   Take 20 mg by mouth every other day.          . lamoTRIgine (LAMICTAL) 100 MG tablet   Oral   Take 100 mg by mouth daily.         Marland Kitchen. lisinopril (PRINIVIL,ZESTRIL) 20 MG tablet   Oral   Take 20 mg by mouth daily.         . metoprolol succinate (TOPROL-XL) 25 MG 24 hr tablet   Oral   Take 50 mg by mouth daily.         . nortriptyline (PAMELOR) 25 MG capsule   Oral   Take 25 mg by mouth 2 (two) times daily as needed (anxiety). As needed         . Oxycodone HCl 10 MG TABS      Limit one tablet by mouth 4-8  times per day if tolerated   240 tablet   0   . pantoprazole (PROTONIX) 40  MG tablet   Oral   Take 40 mg by mouth 2 (two) times daily.          Marland Kitchen tiZANidine (ZANAFLEX) 4 MG tablet   Oral   Take 4 mg by mouth 4 (four) times daily as needed for muscle spasms. 2 to 4 times daily PRN         . traZODone (DESYREL) 100 MG tablet   Oral   Take 100 mg by mouth at bedtime.         Marland Kitchen zolpidem (AMBIEN CR) 12.5 MG CR tablet   Oral   Take 12.5 mg by mouth at bedtime as needed for sleep.         Marland Kitchen ondansetron (ZOFRAN) 4 MG tablet   Oral   Take 1 tablet (4 mg total) by mouth every 6 (six) hours as needed for nausea. Patient not taking: Reported on 03/27/2015   20 tablet   0   . oxyCODONE (OXY IR/ROXICODONE) 5 MG immediate release tablet   Oral   Take 1 tablet (5 mg total) by mouth every 4 (four) hours as needed for moderate pain. Patient not taking: Reported on 03/27/2015   16 tablet   0   . tiZANidine (ZANAFLEX) 4 MG capsule      Limited 1 tab by mouth 2-4 times a day if tolerated   120 capsule   0     Allergies Acetaminophen-codeine; Codeine;  Ibuprofen; Morphine; Propoxyphene; and Sulfa antibiotics  Family History  Problem Relation Age of Onset  . Arthritis Mother   . Depression Mother   . Diabetes Mother   . Heart disease Mother   . Hyperlipidemia Mother   . Hypertension Mother   . Stroke Mother   . Arthritis Father   . Diabetes Father   . Hearing loss Father   . Heart disease Father   . Hyperlipidemia Father   . Hypertension Father     Social History Social History  Substance Use Topics  . Smoking status: Never Smoker   . Smokeless tobacco: Not on file  . Alcohol Use: 0.0 oz/week    0 Standard drinks or equivalent per week     Comment: occasional    Review of Systems Unable to perform, patient's somnolent/lethargic ____________________________________________   PHYSICAL EXAM:  VITAL SIGNS: ED Triage Vitals  Enc Vitals Group     BP 03/27/15 1120 130/76 mmHg     Pulse Rate 03/27/15 1120 127     Resp 03/27/15 1055 25     Temp --      Temp src --      SpO2 03/27/15 1120 100 %     Weight --      Height --      Head Cir --      Peak Flow --      Pain Score 03/27/15 1130 Asleep     Pain Loc --      Pain Edu? --      Excl. in GC? --    Constitutional: Patient is lethargic, arousable to verbal stimuli but briefly only and will follow asleep within 5-10 seconds. Eyes: Conjunctivae are normal. PERRL. EOMI. Head: Atraumatic. Nose: No congestion/rhinnorhea. Mouth/Throat: Mucous membranes are dry.  Oropharynx non-erythematous. Neck: No stridor.   Cardiovascular: Normal rate, regular rhythm. Grossly normal heart sounds.  Good peripheral circulation. Respiratory: Shallow respiratory effort.  No retractions. Lungs CTAB. Gastrointestinal: Soft and nontender except for in the right mid abdomen where the patient has moderate tenderness  to palpation without rebound or guarding she is markedly overweight.. No distention. No abdominal bruits. No CVA tenderness. Musculoskeletal: No lower extremity tenderness nor  edema.  No joint effusions. Neurologic:  Very somnolent, patient unable to participate exam due to somnolence. Skin:  Skin is warm, dry and intact. No rash noted. Psychiatric: Mood and affect are flat.  ----------------------------------------- 2:12 PM on 03/27/2015 -----------------------------------------  The patient has been reexamined throughout, at present she is resting and alerts to verbal and is able to hold conversation. She reports that she is having ongoing pain in the right abdomen, she reports that she's been having to take pain medicine for this and is becoming very severe. The patient denies an attempted overdose, her sister and the patient do believe she may have taken some of her extra pain medication today due to the ongoing nature of the ventral hernia pain. Chest reports that today while walking her left leg gave out under which happens to her frequently, and her left leg is been slightly sore since that time as well which she also took pain medication for.  She has no focal abnormalities on exam, she is much more alert now, she is resting comfortably. She is noted to have some hypoxia with rest, suspect she has some sleep apnea but obtain a chest x-ray to further evaluate. The patient does have notable leukocytosis. I discussed with her surgeon Dr. Deneise Lever at [facility] as I was concerned about the patient's ongoing pain, for which she actually had been evaluated at [facility] last night regarding her ventral hernia, and he advised a recommendation to transfer her to the ER at [facility] where he would evaluate her.  I discussed with the patient, and the patient requests also to be transferred to the [facility] ER for evaluation by her surgeon. I think this is very reasonable request, especially given continuity of care and her ongoing abdominal pain which seems to be driving her to potentially overuse medications.  ____________________________________________   LABS (all labs ordered are listed, but only  abnormal results are displayed)  Labs Reviewed  CBC WITH DIFFERENTIAL/PLATELET - Abnormal; Notable for the following:    WBC 15.5 (*)    Hemoglobin 11.2 (*)    MCHC 31.9 (*)    RDW 16.6 (*)    Neutro Abs 12.7 (*)    All other components within normal limits  BASIC METABOLIC PANEL - Abnormal; Notable for the following:    Sodium 132 (*)    Chloride 96 (*)    Glucose, Bld 128 (*)    BUN 24 (*)    Creatinine, Ser 1.70 (*)    Calcium 8.5 (*)    GFR calc non Af Amer 35 (*)    GFR calc Af Amer 40 (*)    All other components within normal limits  GLUCOSE, CAPILLARY  TROPONIN I  URINALYSIS COMPLETEWITH MICROSCOPIC ([facility] ONLY)   ____________________________________________  EKG   ____________________________________________  RADIOLOGY     DG Chest Port 1 View (Final result) Result time: 03/27/15 14:20:13   Final result by Rad Results In Interface (03/27/15 14:20:13)   Narrative:   CLINICAL DATA: Chronic pain. Altered mental status. Slurred speech.  EXAM: PORTABLE CHEST 1 VIEW  COMPARISON: 12/09/2013  FINDINGS: Normal cardiac silhouette. There is mild central venous congestion. Mild basilar atelectasis. No effusion, infiltrate, pneumothorax.  IMPRESSION: Mild central venous congestion and basilar atelectasis.   Electronically Signed By: Genevive Bi M.D. On: 03/27/2015 14:20  CT Head Wo Contrast (Final result) Result time: 03/27/15 13:39:10   Final result by Rad Results In Interface (03/27/15 13:39:10)   Narrative:   CLINICAL DATA: Numbness on the left starting yesterday  EXAM: CT HEAD WITHOUT CONTRAST  TECHNIQUE: Contiguous axial images were obtained from the base of the skull through the vertex without intravenous contrast.  COMPARISON: 09/12/2013  FINDINGS: Skull and Sinuses:Negative for fracture or destructive process. The mastoids, middle ears, and imaged paranasal sinuses are clear.  Orbits: No acute  abnormality.  Brain: No evidence of acute infarction, hemorrhage, hydrocephalus, or mass lesion/mass effect. No evidence of demyelinating disease. Thin-section images were obtained to evaluate a tiny density in the lateral left temporal lobe on series 2, image 10. This focus is favored to reflect a punctate calcification, and may have been present on 08/24/2012 head CT. No associated edema or swelling to suggest acuity and clinical significance is doubtful.  IMPRESSION: No acute finding or explanation for symptoms.    ____________________________________________   PROCEDURES  Procedure(s) performed: None  Critical Care performed: No  ____________________________________________   INITIAL IMPRESSION / ASSESSMENT AND PLAN / ED COURSE  Pertinent labs & imaging results that were available during my care of the patient were reviewed by me and considered in my medical decision making (see chart for details).  Patient resents for acute somnolence, history is somewhat unclear as the patient is very somnolent. Certainly suspicion is raised for the possibility of  ----------------------------------------- 3:24 PM on 03/27/2015 -----------------------------------------  Patient is calm, she alerts to voice and is resting comfortably at this time. I discussed with her, the patient is agreeable with transfer including the risks and benefits. She understands that she is being transferred to [facility] for further evaluation of her ongoing abdominal pain, and evaluation with her surgical team. Overall the patient is much improved from presentation with mental status improving.  Accepted in transfer, going via air care ALS service to the [facility] ER. Attending accepting is Dr. Aura Fey. ____________________________________________   FINAL CLINICAL IMPRESSION(S) / ED DIAGNOSES  Final diagnoses:  Acute kidney injury (HCC)  Ventral hernia without obstruction or gangrene  Drug overdose, accidental  or unintentional, initial encounter      Sharyn Creamer, MD 03/27/15 1553

## 2015-03-27 NOTE — ED Notes (Addendum)
49 yof PMHx DM, HTN, GERD, chronic pain presents from pain management clinic for altered mental status. Pt responding with slurred speech, drowsy. Glucose 87 on arrival to ED.   Assessment completed @ 11am:  Gen: Somnolent, laying in bed.  Neuro: GCS 8, slurred incomprehensible speech. Gross nonpurposeful motor movements to all 4 extremities.  CV: Tachy, pedal edema 1+ BLE Pulm: CTA, diminished in bilateral lower lobes,  Abd: Obese, soft, some facial grimacing with palpation.  Ext: Warm and well perfused.   PIV: #22g left axilla/shoulder ; #20g right axilla/shoulder region.   Meds Given in ED:  - 2L NS bolus - 0.4 mg Narcan x 2  (transient response approx 3-5 min to each dose of narcan with eye opening and coherent speech) - 1.0 mg Narcan x 1  (transiently responded with improvement of mentation followed by extremely large liquidy bowel movement) - 4mg  Zofran IV (for emesis x 1)    Pt and family [facility].   Pt more alert, responsive with clear speech. GCS now 14 for eyes opening to verbal stimuli.

## 2015-03-27 NOTE — Patient Instructions (Signed)
PLAN   Continue present medications prescriptions given to patient today  F/U PCP Dr. Sampson GoonFitzgerald for evaliation of  BP diabetes mellitus and general medical  condition  F/U surgical evaluation as discussed  F/U neurological evaluation. May consider pending follow-up evaluations  F/U psych evaluation with Dr.Su   May consider radiofrequency rhizolysis or intraspinal procedures pending response to present treatment and F/U evaluation   Patient to call Pain Management Center should patient have concerns prior to scheduled return appointment.

## 2015-04-02 ENCOUNTER — Ambulatory Visit: Payer: Medicare Other | Attending: Pain Medicine | Admitting: Pain Medicine

## 2015-04-02 ENCOUNTER — Encounter: Payer: Self-pay | Admitting: Pain Medicine

## 2015-04-02 VITALS — BP 126/80 | HR 91 | Temp 98.3°F | Resp 18 | Ht 64.0 in | Wt 284.0 lb

## 2015-04-02 DIAGNOSIS — M546 Pain in thoracic spine: Secondary | ICD-10-CM | POA: Diagnosis present

## 2015-04-02 DIAGNOSIS — M47816 Spondylosis without myelopathy or radiculopathy, lumbar region: Secondary | ICD-10-CM | POA: Diagnosis not present

## 2015-04-02 DIAGNOSIS — E1140 Type 2 diabetes mellitus with diabetic neuropathy, unspecified: Secondary | ICD-10-CM | POA: Insufficient documentation

## 2015-04-02 DIAGNOSIS — M170 Bilateral primary osteoarthritis of knee: Secondary | ICD-10-CM | POA: Diagnosis not present

## 2015-04-02 DIAGNOSIS — M542 Cervicalgia: Secondary | ICD-10-CM | POA: Diagnosis present

## 2015-04-02 DIAGNOSIS — N390 Urinary tract infection, site not specified: Secondary | ICD-10-CM | POA: Insufficient documentation

## 2015-04-02 DIAGNOSIS — G43009 Migraine without aura, not intractable, without status migrainosus: Secondary | ICD-10-CM

## 2015-04-02 DIAGNOSIS — J189 Pneumonia, unspecified organism: Secondary | ICD-10-CM | POA: Insufficient documentation

## 2015-04-02 DIAGNOSIS — M5481 Occipital neuralgia: Secondary | ICD-10-CM | POA: Diagnosis not present

## 2015-04-02 DIAGNOSIS — R6510 Systemic inflammatory response syndrome (SIRS) of non-infectious origin without acute organ dysfunction: Secondary | ICD-10-CM | POA: Insufficient documentation

## 2015-04-02 DIAGNOSIS — G588 Other specified mononeuropathies: Secondary | ICD-10-CM | POA: Diagnosis not present

## 2015-04-02 DIAGNOSIS — G43109 Migraine with aura, not intractable, without status migrainosus: Secondary | ICD-10-CM

## 2015-04-02 DIAGNOSIS — M5136 Other intervertebral disc degeneration, lumbar region: Secondary | ICD-10-CM | POA: Insufficient documentation

## 2015-04-02 DIAGNOSIS — E1340 Other specified diabetes mellitus with diabetic neuropathy, unspecified: Secondary | ICD-10-CM

## 2015-04-02 DIAGNOSIS — R51 Headache: Secondary | ICD-10-CM | POA: Diagnosis present

## 2015-04-02 NOTE — Patient Instructions (Addendum)
PLAN   Continue present medications please note that at this time we'll need lateral from Dr.  Stevphen RochesterHansen Su stating that you are appropriate candidate to receive opioid medication and that he will continue to monitor you closely to ensure that you remain appropriate candidate to receive opioid medications   F/U PCP Dr. Sampson GoonFitzgerald for follow-up evaluation status post recent emergency department evaluation and [facility] and [facility] [place] and for evaliation of  BP diabetes mellitus and general medical  condition  F/U surgical evaluation as planned    F/U neurological evaluation. May consider pending follow-up evaluations  F/U  Dr Stevphen RochesterHansen Su status post recent emergency department [facility] and [facility] [place]. Please follow-up with Dr.Su to discuss your status in terms of receiving opioid medications and continued to follow-up with with Dr.Su in this regard    May consider radiofrequency rhizolysis or intraspinal procedures pending response to present treatment and F/U evaluation   Patient to call Pain Management Center should patient have concerns prior to scheduled return appointment.

## 2015-04-02 NOTE — Progress Notes (Signed)
Subjective:    Patient ID: Daisy [name], female    DOB: August 09, 1965, [age] y.o.   MRN: [id number]  HPI Patient is 49 year old female returns to pain management Center for further evaluation and treatment of pain involving the neck associated with headaches as well as pain of the cervical thoracic and lumbar upper and lower extremity regions. Patient is with recent evaluation in the emergency room [facility]. Patient states that she was diagnosed as having SIRS . We discussed patient's condition on today's visit and informed patient that we would like to have patient undergo follow-up evaluation with Dr. Sampson GoonFitzgerald as well as with Dr. Janeece RiggersSu prior to resuming prescribing medications for treatment of patient's pain. We will remain available to consider modification of treatment regimen pending further assessment of patient's condition as discussed. The patient was understanding and in agreement with suggested treatment plan.   Review of Systems     Objective:   Physical Exam  Physical examination revealed patient to be with tenderness to palpation of paraspinal muscles and cervical and cervical facet region. There was tenderness over the splenius capitis and occipitalis region of moderate degree. The patient was with unremarkable Spurling's maneuver there was minimal tenderness of the acromioclavicular and glenohumeral joint regions. Palpation over the trapezius levator scapula and rhomboid musculature regions reproduced moderate discomfort. Patient appeared to be with bilaterally equal grip strength and Tinel and Phalen's maneuver without increased pain of significant degree. Palpation over the thoracic facet thoracic paraspinal musculature region was with moderate tends to palpation without crepitus of the thoracic region noted. Palpation over the lumbar paraspinal musculature region lumbar facet region was with moderately severe  discomfort. Lateral bending and rotation and extension and palpation of the lumbar facets reproduced moderate discomfort. There was moderate tenderness of the PSIS PII S region as well as the gluteal and piriformis musculature regions. There was moderate tends to palpation of the greater trochanteric region and iliotibial band region. Straight leg raising was tolerated to 20 without increased pain with dorsiflexion noted. There were well-healed scars of the knees with negative anterior and posterior drawer signs without ballottement of the patella. Crepitus of the knees were noted. Recent laxity of the joint was noted. No new lesions of the skin in the region of the knees were noted. There was negative clonus negative Homans. Abdomen was with mild tenderness to palpation. There was mild costovertebral angle tenderness      Assessment & Plan:   Degenerative disc disease of the lumbar spine Multilevel degenerative changes lumbar spine with L4-L5 level involving predominantly  Degenerative joint disease of knees Status post surgery of knees  Diabetes mellitus Diabetic neuropathy  Bilateral occipital neuralgia  Intercostal neuralgia  Migraine headaches  Pneumonia  Urinary tract infection  SIRS (recent)     PLAN   Continue present medications. At the present time we will await evaluation by Dr. Janeece RiggersSu prior to prescribing additional medications for treatment of patient's pain  F/U PCP Dr. Sampson GoonFitzgerald for evaluation status post recent hospitalization for apparent  SIRS and for evaliation of  BP diabetes mellitus with and general medical  condition  F/U surgical evaluation as planned  F/U neurological evaluation as discussed  May consider radiofrequency rhizolysis or intraspinal procedures pending response to present treatment and F/U evaluation . We will avoid considering interventional treatment at this time  Patient to call Pain Management Center should patient have concerns prior  to scheduled return appointment.

## 2015-04-02 NOTE — Progress Notes (Signed)
Safety precautions to be maintained throughout the outpatient stay will include: orient to surroundings, keep bed in low position, maintain call bell within reach at all times, provide assistance with transfer out of bed and ambulation.  Patient was hospitalized Oct. 13 to the 17th for possible sepsis, UTI, kidney infection.

## 2015-04-03 ENCOUNTER — Telehealth: Payer: Self-pay

## 2015-04-03 NOTE — Telephone Encounter (Signed)
Patient had called this morning stating that Dr. Janeece RiggersSu is supposed to be sending a fax stating this. We have not received a fax.

## 2015-04-03 NOTE — Telephone Encounter (Signed)
Nurses Informed patient that she will need to have evaluation by Dr.Hansen Su to confirm the patient is appropriate candidate to receive opioid medications as has already been explained to patient As explained to patient, the patient will need to go for evaluation by Dr.Su so that he can review the details of patient's recent emergency department evaluation and treatment as well as [facility] and [facility] prior to being able to prescribe medications for treatment of patient

## 2015-04-03 NOTE — Telephone Encounter (Signed)
Patient wants to know if she is going to get her medication. She has been waiting since yesterday and wants to know today by 2pm or she is coming up here.  Please call. Thanks

## 2015-04-14 ENCOUNTER — Ambulatory Visit: Payer: Medicare Other | Attending: Pain Medicine | Admitting: Pain Medicine

## 2015-04-14 ENCOUNTER — Encounter: Payer: Self-pay | Admitting: Pain Medicine

## 2015-04-14 VITALS — BP 156/103 | HR 81 | Temp 99.0°F | Resp 16 | Ht 64.0 in | Wt 282.0 lb

## 2015-04-14 DIAGNOSIS — M5136 Other intervertebral disc degeneration, lumbar region: Secondary | ICD-10-CM | POA: Insufficient documentation

## 2015-04-14 DIAGNOSIS — E1340 Other specified diabetes mellitus with diabetic neuropathy, unspecified: Secondary | ICD-10-CM

## 2015-04-14 DIAGNOSIS — M5481 Occipital neuralgia: Secondary | ICD-10-CM | POA: Insufficient documentation

## 2015-04-14 DIAGNOSIS — M545 Low back pain: Secondary | ICD-10-CM | POA: Diagnosis not present

## 2015-04-14 DIAGNOSIS — R102 Pelvic and perineal pain: Secondary | ICD-10-CM | POA: Diagnosis not present

## 2015-04-14 DIAGNOSIS — Z9889 Other specified postprocedural states: Secondary | ICD-10-CM | POA: Diagnosis not present

## 2015-04-14 DIAGNOSIS — G43009 Migraine without aura, not intractable, without status migrainosus: Secondary | ICD-10-CM

## 2015-04-14 DIAGNOSIS — M170 Bilateral primary osteoarthritis of knee: Secondary | ICD-10-CM | POA: Insufficient documentation

## 2015-04-14 DIAGNOSIS — E1140 Type 2 diabetes mellitus with diabetic neuropathy, unspecified: Secondary | ICD-10-CM | POA: Diagnosis not present

## 2015-04-14 DIAGNOSIS — R51 Headache: Secondary | ICD-10-CM | POA: Diagnosis not present

## 2015-04-14 DIAGNOSIS — G43109 Migraine with aura, not intractable, without status migrainosus: Secondary | ICD-10-CM

## 2015-04-14 MED ORDER — OXYCODONE HCL 10 MG PO TABS: ORAL_TABLET | ORAL | Status: DC

## 2015-04-14 NOTE — Progress Notes (Signed)
Safety precautions to be maintained throughout the outpatient stay will include: orient to surroundings, keep bed in low position, maintain call bell within reach at all times, provide assistance with transfer out of bed and ambulation.  

## 2015-04-14 NOTE — Patient Instructions (Addendum)
PLAN   Continue present medication oxycodone. No Zanaflex at this time   F/U PCP Dr. Sampson GoonFitzgerald for evaliation of  BP diabetes mellitus and general medical  condition. Also follow-up with Dr. Sampson GoonFitzgerald regarding [facility]. Patient's blood pressure was noted to be elevated and was discussed with patient who will follow-up with Dr. Sampson GoonFitzgerald this week as discussed   F/U surgical evaluation. May consider pending follow-up evaluations  F/U neurological evaluation. May consider pending follow-up evaluations  F/U Dr. Janeece RiggersSu for continued evaluation and treatment  May consider radiofrequency rhizolysis or intraspinal procedures pending response to present treatment and F/U evaluation   Patient to call [facility] should patient have concerns prior to scheduled return appointment.

## 2015-04-14 NOTE — Progress Notes (Signed)
   Subjective:    Patient ID: Daisy [name], female    DOB: 04-14-1966, 49 y.o.   MRN: [id number]  HPI Patient is 49 year old female returns [facility] for further evaluation and treatment of pain in involving the cervical thoracic lumbar upper and lower extremity regions and headaches. On today's visit we discussed [facility] as well as follow-up evaluation with Dr. Janeece RiggersSu . The patient was given a two-week supply prescription of oxycodone 10 mg size limit 4 per day and the Zanaflex was eliminated. We informed patient of the need to closely monitor her condition and for continuous follow-up evaluations with Dr.Su as well the patient expressed understanding and willingness to comply we'll avoid interventional treatment. Patient will undergo follow-up surgical evaluation of abdominal and pelvic pain as well as planned. All understanding and in agreement with suggested treatment plan.    Review of Systems     Objective:   Physical Exam  The patient was awake alert oriented 3 and in no acute distress. The patient appeared quite appropriate on today's visit and appeared to be in no distress. There was tenderness of the splenius capitis of talus region of mild/moderate degree. There was unremarkable Spurling's maneuver and patient appeared to be with slightly decreased grip strength. Tinel and Phalen's maneuver were without increased pain of significant degree. There was tenderness of the acromioclavicular and glenohumeral joint regions of moderate degree. Palpation over the thoracic and cervical region reproduces moderate discomfort. Palpation over the lumbar paraspinal musculature region reproduced moderate to moderately severe discomfort. Lateral bending and rotation and extension and palpation over the lumbar facets reproduce moderate discomfort. There was moderate tends of the knees with well-healed surgical scars of the  knees. There was crepitus of the knees. There was negative anterior posterior drawer signs of the knees. EHL strength appeared to be decreased in no sensory deficit of dermatomal distribution was detected. There was mild tinnitus of the greater trochanteric region and iliotibial band region and moderate tenderness over the PSIS and PII S regions. Abdomen was protuberant with mild tenderness to palpation without fluid wave shifting dullness or pulsatile masses and without rebound tenderness no costovertebral tenderness was noted      Assessment & Plan:     Degenerative disc disease of the lumbar spine Multilevel degenerative changes lumbar spine with L4-L5 level involving predominantly  Degenerative joint disease of knees Status post surgery of knees  Diabetes mellitus Diabetic neuropathy  Bilateral occipital neuralgia  Intercostal neuralgia    PLAN   Continue present medication  oxycodone. We will discontinue Zanaflex at this time  The patient was given a two-week supply of oxycodone 10 mg limit 2-4 per day if tolerated  F/U PCP Dr. Sampson GoonFitzgerald for evaliation of  BP diabetes mellitus and general medical  condition status [facility] as discussed  F/U surgical evaluation as planned for further evaluation of abdominal and pelvic pain  F/U surgical evaluation of pain of the lumbar and lower extremity regions and knees as discussed  F/U Dr.Su as planned   F/U neurological evaluation as discussed  Will avoid radiofrequency procedures and implantation type procedures at this time Patient is to call pain management prior to scheduled return appointment should there be change in condition or other concerns regarding condition prior to scheduled return appointment

## 2015-04-28 ENCOUNTER — Encounter: Payer: Medicare Other | Admitting: Pain Medicine

## 2015-04-30 ENCOUNTER — Encounter: Payer: Self-pay | Admitting: Pain Medicine

## 2015-04-30 ENCOUNTER — Ambulatory Visit: Payer: Medicare Other | Attending: Pain Medicine | Admitting: Pain Medicine

## 2015-04-30 VITALS — BP 128/75 | HR 74 | Temp 98.2°F | Resp 18 | Ht 64.0 in | Wt 285.0 lb

## 2015-04-30 DIAGNOSIS — M546 Pain in thoracic spine: Secondary | ICD-10-CM | POA: Diagnosis present

## 2015-04-30 DIAGNOSIS — M170 Bilateral primary osteoarthritis of knee: Secondary | ICD-10-CM | POA: Diagnosis not present

## 2015-04-30 DIAGNOSIS — G43109 Migraine with aura, not intractable, without status migrainosus: Secondary | ICD-10-CM

## 2015-04-30 DIAGNOSIS — J329 Chronic sinusitis, unspecified: Secondary | ICD-10-CM | POA: Diagnosis not present

## 2015-04-30 DIAGNOSIS — M5136 Other intervertebral disc degeneration, lumbar region: Secondary | ICD-10-CM | POA: Diagnosis not present

## 2015-04-30 DIAGNOSIS — G43009 Migraine without aura, not intractable, without status migrainosus: Secondary | ICD-10-CM

## 2015-04-30 DIAGNOSIS — G588 Other specified mononeuropathies: Secondary | ICD-10-CM | POA: Diagnosis not present

## 2015-04-30 DIAGNOSIS — K2970 Gastritis, unspecified, without bleeding: Secondary | ICD-10-CM

## 2015-04-30 DIAGNOSIS — M47816 Spondylosis without myelopathy or radiculopathy, lumbar region: Secondary | ICD-10-CM | POA: Insufficient documentation

## 2015-04-30 DIAGNOSIS — Z9889 Other specified postprocedural states: Secondary | ICD-10-CM | POA: Insufficient documentation

## 2015-04-30 DIAGNOSIS — M5481 Occipital neuralgia: Secondary | ICD-10-CM

## 2015-04-30 DIAGNOSIS — E1340 Other specified diabetes mellitus with diabetic neuropathy, unspecified: Secondary | ICD-10-CM

## 2015-04-30 DIAGNOSIS — J309 Allergic rhinitis, unspecified: Secondary | ICD-10-CM | POA: Diagnosis not present

## 2015-04-30 DIAGNOSIS — M51369 Other intervertebral disc degeneration, lumbar region without mention of lumbar back pain or lower extremity pain: Secondary | ICD-10-CM

## 2015-04-30 DIAGNOSIS — E1140 Type 2 diabetes mellitus with diabetic neuropathy, unspecified: Secondary | ICD-10-CM | POA: Diagnosis not present

## 2015-04-30 DIAGNOSIS — M542 Cervicalgia: Secondary | ICD-10-CM | POA: Diagnosis present

## 2015-04-30 MED ORDER — OXYCODONE HCL 10 MG PO TABS: ORAL_TABLET | ORAL | Status: DC

## 2015-04-30 NOTE — Progress Notes (Signed)
Safety precautions to be maintained throughout the outpatient stay will include: orient to surroundings, keep bed in low position, maintain call bell within reach at all times, provide assistance with transfer out of bed and ambulation.  

## 2015-04-30 NOTE — Progress Notes (Signed)
   Subjective:    Patient ID: Daisy [name], female    DOB: 08/30/1965, 49 y.o.   MRN: [id number]  HPI  The patient is a 49 year old female who returns to pain management Center for further evaluation and treatment of pain involving the region of the neck entire back upper and lower extremity regions and headaches. Patient will undergo further surgical evaluation of her abdominal condition as planned. At the present time we will continue patient's medications consisting of oxycodone. We have eliminated Zanaflex at this time due to patient's recent hospitalization. At the present time we will have patient continue under the care of of Dr. Janeece RiggersSu and we will continue medications as prescribed. The patient denies any trauma significant change in events sedated him to cause significant change in her condition. The patient was understanding and agreed to suggested treatment plan.     Review of Systems     Objective:   Physical Exam There was tenderness to palpation over the region of the splenius capitis and a separate talus musculature regions of moderate degree. No masses of the head and neck were noted. There was mild tenderness to palpation over the region of the sinuses There was moderate tenderness to palpation over the cervical facet cervical paraspinal musculature region. There was minimal tenderness of the acromioclavicular and glenohumeral joint regions. Patient appeared to be with bilaterally equal grip strength and Tinel and Phalen's maneuver without increased pain of significant degree. Palpation over the thoracic facet thoracic paraspinal muscles reproduce moderate discomfort. Palpation over the lumbar paraspinal muscles was with increased pain of moderate degree with lateral bending and rotation extension and palpation over the lumbar facets. Straight leg raising was tolerated to approximately 20 without increased pain with dorsiflexion noted EHL strength appeared to be equal and slightly  decreased with no sensory deficit of dermatomal distribution detected. There was negative clonus negative Homans. There was tenderness to palpation of the knees with negative anterior and posterior drawer signs without ballottement of the patella. Abdomen was protuberant with mild tenderness to palpation of the left abdominal region without rebound tenderness noted. No costovertebral tenderness was noted.      Assessment & Plan:  Degenerative disc disease of the lumbar spine Multilevel degenerative changes lumbar spine with L4-L5 level involving predominantly  Degenerative joint disease of knees Status post surgery of knees  Diabetes mellitus Diabetic neuropathy  Bilateral occipital neuralgia  Intercostal neuralgia  Sinusitis/Allergic rhinitis   Plan  Continue present medication oxycodone  F/U PCP Dr. Sampson GoonFitzgerald for evaliation of  BP diabetes mellitus sinus condition allergies and general medical  condition  F/U surgical evaluation. May consider pending follow-up evaluations  F/U neurological evaluation. May consider pending follow-up evaluations  F/U Dr.Su as planned  May consider radiofrequency rhizolysis or intraspinal procedures pending response to present treatment and F/U evaluation   Patient to call [facility] should patient have concerns prior to scheduled return appointment.

## 2015-04-30 NOTE — Patient Instructions (Addendum)
PLAN  c 

## 2015-05-01 ENCOUNTER — Ambulatory Visit: Payer: Medicare Other | Admitting: Pain Medicine

## 2015-05-05 ENCOUNTER — Observation Stay
Admission: EM | Admit: 2015-05-05 | Discharge: 2015-05-09 | Disposition: A | Discharge status: Home / Self Care | Payer: Medicare Other | Attending: Internal Medicine | Admitting: Internal Medicine

## 2015-05-05 ENCOUNTER — Emergency Department: Payer: Medicare Other

## 2015-05-05 ENCOUNTER — Encounter: Payer: Self-pay | Admitting: Emergency Medicine

## 2015-05-05 DIAGNOSIS — N83209 Unspecified ovarian cyst, unspecified side: Secondary | ICD-10-CM | POA: Diagnosis not present

## 2015-05-05 DIAGNOSIS — K3184 Gastroparesis: Secondary | ICD-10-CM | POA: Insufficient documentation

## 2015-05-05 DIAGNOSIS — F419 Anxiety disorder, unspecified: Secondary | ICD-10-CM | POA: Insufficient documentation

## 2015-05-05 DIAGNOSIS — I10 Essential (primary) hypertension: Secondary | ICD-10-CM | POA: Diagnosis not present

## 2015-05-05 DIAGNOSIS — Z794 Long term (current) use of insulin: Secondary | ICD-10-CM | POA: Insufficient documentation

## 2015-05-05 DIAGNOSIS — R109 Unspecified abdominal pain: Secondary | ICD-10-CM | POA: Diagnosis present

## 2015-05-05 DIAGNOSIS — Z8261 Family history of arthritis: Secondary | ICD-10-CM | POA: Insufficient documentation

## 2015-05-05 DIAGNOSIS — G8929 Other chronic pain: Secondary | ICD-10-CM | POA: Insufficient documentation

## 2015-05-05 DIAGNOSIS — K219 Gastro-esophageal reflux disease without esophagitis: Secondary | ICD-10-CM | POA: Diagnosis not present

## 2015-05-05 DIAGNOSIS — Z833 Family history of diabetes mellitus: Secondary | ICD-10-CM | POA: Insufficient documentation

## 2015-05-05 DIAGNOSIS — Z8249 Family history of ischemic heart disease and other diseases of the circulatory system: Secondary | ICD-10-CM | POA: Insufficient documentation

## 2015-05-05 DIAGNOSIS — M179 Osteoarthritis of knee, unspecified: Secondary | ICD-10-CM | POA: Diagnosis not present

## 2015-05-05 DIAGNOSIS — R112 Nausea with vomiting, unspecified: Secondary | ICD-10-CM | POA: Diagnosis not present

## 2015-05-05 DIAGNOSIS — K449 Diaphragmatic hernia without obstruction or gangrene: Secondary | ICD-10-CM | POA: Insufficient documentation

## 2015-05-05 DIAGNOSIS — Z9049 Acquired absence of other specified parts of digestive tract: Secondary | ICD-10-CM | POA: Insufficient documentation

## 2015-05-05 DIAGNOSIS — Z820 Family history of epilepsy and other diseases of the nervous system: Secondary | ICD-10-CM | POA: Diagnosis not present

## 2015-05-05 DIAGNOSIS — Z823 Family history of stroke: Secondary | ICD-10-CM | POA: Insufficient documentation

## 2015-05-05 DIAGNOSIS — K2970 Gastritis, unspecified, without bleeding: Secondary | ICD-10-CM | POA: Diagnosis not present

## 2015-05-05 DIAGNOSIS — R55 Syncope and collapse: Secondary | ICD-10-CM | POA: Diagnosis not present

## 2015-05-05 DIAGNOSIS — R1011 Right upper quadrant pain: Secondary | ICD-10-CM | POA: Diagnosis not present

## 2015-05-05 DIAGNOSIS — Z888 Allergy status to other drugs, medicaments and biological substances status: Secondary | ICD-10-CM | POA: Insufficient documentation

## 2015-05-05 DIAGNOSIS — R1013 Epigastric pain: Principal | ICD-10-CM | POA: Insufficient documentation

## 2015-05-05 DIAGNOSIS — Z6841 Body Mass Index (BMI) 40.0 and over, adult: Secondary | ICD-10-CM | POA: Diagnosis not present

## 2015-05-05 DIAGNOSIS — G43909 Migraine, unspecified, not intractable, without status migrainosus: Secondary | ICD-10-CM | POA: Diagnosis not present

## 2015-05-05 DIAGNOSIS — E1143 Type 2 diabetes mellitus with diabetic autonomic (poly)neuropathy: Secondary | ICD-10-CM | POA: Insufficient documentation

## 2015-05-05 DIAGNOSIS — Z885 Allergy status to narcotic agent status: Secondary | ICD-10-CM | POA: Diagnosis not present

## 2015-05-05 DIAGNOSIS — M5481 Occipital neuralgia: Secondary | ICD-10-CM | POA: Insufficient documentation

## 2015-05-05 DIAGNOSIS — Z882 Allergy status to sulfonamides status: Secondary | ICD-10-CM | POA: Insufficient documentation

## 2015-05-05 DIAGNOSIS — K439 Ventral hernia without obstruction or gangrene: Secondary | ICD-10-CM | POA: Diagnosis not present

## 2015-05-05 DIAGNOSIS — F329 Major depressive disorder, single episode, unspecified: Secondary | ICD-10-CM | POA: Diagnosis not present

## 2015-05-05 DIAGNOSIS — M5136 Other intervertebral disc degeneration, lumbar region: Secondary | ICD-10-CM | POA: Diagnosis not present

## 2015-05-05 DIAGNOSIS — Z79899 Other long term (current) drug therapy: Secondary | ICD-10-CM | POA: Diagnosis not present

## 2015-05-05 DIAGNOSIS — E1140 Type 2 diabetes mellitus with diabetic neuropathy, unspecified: Secondary | ICD-10-CM | POA: Diagnosis not present

## 2015-05-05 DIAGNOSIS — E876 Hypokalemia: Secondary | ICD-10-CM | POA: Diagnosis not present

## 2015-05-05 DIAGNOSIS — Z90710 Acquired absence of both cervix and uterus: Secondary | ICD-10-CM | POA: Insufficient documentation

## 2015-05-05 DIAGNOSIS — Z7984 Long term (current) use of oral hypoglycemic drugs: Secondary | ICD-10-CM | POA: Diagnosis not present

## 2015-05-05 LAB — URINALYSIS COMPLETE WITH MICROSCOPIC (ARMC ONLY)
Bilirubin Urine: NEGATIVE
Glucose, UA: NEGATIVE mg/dL
Hgb urine dipstick: NEGATIVE
NITRITE: NEGATIVE
PH: 7 (ref 5.0–8.0)
PROTEIN: 100 mg/dL — AB
Specific Gravity, Urine: 1.02 (ref 1.005–1.030)

## 2015-05-05 LAB — COMPREHENSIVE METABOLIC PANEL
ALK PHOS: 67 U/L (ref 38–126)
ALT: 35 U/L (ref 14–54)
AST: 35 U/L (ref 15–41)
Albumin: 4.1 g/dL (ref 3.5–5.0)
Anion gap: 12 (ref 5–15)
BILIRUBIN TOTAL: 0.5 mg/dL (ref 0.3–1.2)
BUN: 7 mg/dL (ref 6–20)
CO2: 25 mmol/L (ref 22–32)
Calcium: 9.1 mg/dL (ref 8.9–10.3)
Chloride: 93 mmol/L — ABNORMAL LOW (ref 101–111)
Creatinine, Ser: 0.72 mg/dL (ref 0.44–1.00)
GFR calc Af Amer: >60 mL/min (ref >60)
GFR calc non Af Amer: >60 mL/min (ref >60)
GLUCOSE: 168 mg/dL — AB (ref 65–99)
POTASSIUM: 3.6 mmol/L (ref 3.5–5.1)
Sodium: 130 mmol/L — ABNORMAL LOW (ref 135–145)
TOTAL PROTEIN: 8.9 g/dL — AB (ref 6.5–8.1)

## 2015-05-05 LAB — CBC
HEMATOCRIT: 40.2 % (ref 35.0–47.0)
Hemoglobin: 13 g/dL (ref 12.0–16.0)
MCH: 27.3 pg (ref 26.0–34.0)
MCHC: 32.2 g/dL (ref 32.0–36.0)
MCV: 84.6 fL (ref 80.0–100.0)
Platelets: 409 10*3/uL (ref 150–440)
RBC: 4.75 MIL/uL (ref 3.80–5.20)
RDW: 16.8 % — AB (ref 11.5–14.5)
WBC: 11.5 10*3/uL — ABNORMAL HIGH (ref 3.6–11.0)

## 2015-05-05 LAB — LIPASE, BLOOD: Lipase: 19 U/L (ref 11–51)

## 2015-05-05 LAB — GLUCOSE, CAPILLARY: Glucose-Capillary: 172 mg/dL — ABNORMAL HIGH (ref 65–99)

## 2015-05-05 LAB — TROPONIN I

## 2015-05-05 MED ORDER — FUROSEMIDE 20 MG PO TABS
20.0000 mg | ORAL_TABLET | Freq: Every day | ORAL | Status: DC
  Administered 2015-05-05 – 2015-05-09 (×5): 20 mg via ORAL
  Filled 2015-05-05 (×5): qty 1

## 2015-05-05 MED ORDER — HYDRALAZINE HCL 20 MG/ML IJ SOLN
INTRAMUSCULAR | Status: AC
  Administered 2015-05-05: 10 mg via INTRAVENOUS
  Filled 2015-05-05: qty 1

## 2015-05-05 MED ORDER — ONDANSETRON HCL 4 MG/2ML IJ SOLN
INTRAMUSCULAR | Status: AC
  Filled 2015-05-05: qty 2

## 2015-05-05 MED ORDER — NITROGLYCERIN 2 % TD OINT
1.0000 [in_us] | TOPICAL_OINTMENT | Freq: Four times a day (QID) | TRANSDERMAL | Status: DC
  Administered 2015-05-06 – 2015-05-08 (×5): 1 [in_us] via TOPICAL
  Filled 2015-05-05 (×7): qty 1

## 2015-05-05 MED ORDER — HYDROMORPHONE HCL 1 MG/ML IJ SOLN
0.5000 mg | INTRAMUSCULAR | Status: DC | PRN
  Administered 2015-05-05 – 2015-05-08 (×10): 0.5 mg via INTRAVENOUS
  Filled 2015-05-05 (×11): qty 1

## 2015-05-05 MED ORDER — HYDRALAZINE HCL 20 MG/ML IJ SOLN
10.0000 mg | INTRAMUSCULAR | Status: DC | PRN
  Administered 2015-05-05 – 2015-05-06 (×2): 10 mg via INTRAVENOUS
  Filled 2015-05-05: qty 1

## 2015-05-05 MED ORDER — HEPARIN SODIUM (PORCINE) 5000 UNIT/ML IJ SOLN
5000.0000 [IU] | Freq: Three times a day (TID) | INTRAMUSCULAR | Status: DC
  Administered 2015-05-05 – 2015-05-07 (×6): 5000 [IU] via SUBCUTANEOUS
  Filled 2015-05-05 (×6): qty 1

## 2015-05-05 MED ORDER — GI COCKTAIL ~~LOC~~
30.0000 mL | Freq: Once | ORAL | Status: AC
Start: 2015-05-05 — End: 2015-05-05
  Administered 2015-05-05: 30 mL via ORAL
  Filled 2015-05-05: qty 30

## 2015-05-05 MED ORDER — ONDANSETRON 4 MG PO TBDP
ORAL_TABLET | ORAL | Status: AC
  Filled 2015-05-05: qty 1

## 2015-05-05 MED ORDER — FENTANYL CITRATE (PF) 100 MCG/2ML IJ SOLN
50.0000 ug | Freq: Once | INTRAMUSCULAR | Status: AC
  Administered 2015-05-05: 50 ug via INTRAVENOUS
  Filled 2015-05-05: qty 2

## 2015-05-05 MED ORDER — DIPHENHYDRAMINE HCL 50 MG/ML IJ SOLN: 25.0000 mg | INTRAMUSCULAR | Status: DC | PRN

## 2015-05-05 MED ORDER — TRAZODONE HCL 100 MG PO TABS
100.0000 mg | ORAL_TABLET | Freq: Every day | ORAL | Status: DC
  Administered 2015-05-05 – 2015-05-08 (×4): 100 mg via ORAL
  Filled 2015-05-05 (×4): qty 1

## 2015-05-05 MED ORDER — ACETAMINOPHEN 650 MG RE SUPP: 650.0000 mg | Freq: Four times a day (QID) | RECTAL | Status: DC | PRN

## 2015-05-05 MED ORDER — ALPRAZOLAM 1 MG PO TABS
2.0000 mg | ORAL_TABLET | Freq: Three times a day (TID) | ORAL | Status: DC | PRN
  Administered 2015-05-05 – 2015-05-08 (×6): 2 mg via ORAL
  Filled 2015-05-05 (×6): qty 2

## 2015-05-05 MED ORDER — MORPHINE SULFATE (PF) 2 MG/ML IV SOLN
INTRAVENOUS | Status: AC
Start: 2015-05-05 — End: 2015-05-05
  Filled 2015-05-05: qty 3

## 2015-05-05 MED ORDER — OXYCODONE HCL 5 MG PO TABS
5.0000 mg | ORAL_TABLET | ORAL | Status: DC | PRN
  Administered 2015-05-06 – 2015-05-09 (×8): 5 mg via ORAL
  Filled 2015-05-05 (×9): qty 1

## 2015-05-05 MED ORDER — FLUOXETINE HCL 20 MG PO CAPS
20.0000 mg | ORAL_CAPSULE | Freq: Every day | ORAL | Status: DC
  Administered 2015-05-05 – 2015-05-09 (×5): 20 mg via ORAL
  Filled 2015-05-05 (×5): qty 1

## 2015-05-05 MED ORDER — TIZANIDINE HCL 4 MG PO TABS
4.0000 mg | ORAL_TABLET | Freq: Four times a day (QID) | ORAL | Status: DC | PRN
  Administered 2015-05-05 – 2015-05-06 (×2): 4 mg via ORAL
  Filled 2015-05-05 (×2): qty 1

## 2015-05-05 MED ORDER — ACETAMINOPHEN 325 MG PO TABS: 650.0000 mg | ORAL_TABLET | Freq: Four times a day (QID) | ORAL | Status: DC | PRN

## 2015-05-05 MED ORDER — ONDANSETRON HCL 4 MG/2ML IJ SOLN
4.0000 mg | Freq: Once | INTRAMUSCULAR | Status: AC
  Administered 2015-05-05: 4 mg via INTRAVENOUS

## 2015-05-05 MED ORDER — MORPHINE SULFATE (PF) 4 MG/ML IV SOLN
6.0000 mg | Freq: Once | INTRAVENOUS | Status: AC
  Administered 2015-05-05: 6 mg via INTRAVENOUS

## 2015-05-05 MED ORDER — ONDANSETRON HCL 4 MG/2ML IJ SOLN
4.0000 mg | Freq: Four times a day (QID) | INTRAMUSCULAR | Status: DC | PRN
  Administered 2015-05-05 – 2015-05-07 (×4): 4 mg via INTRAVENOUS
  Filled 2015-05-05 (×4): qty 2

## 2015-05-05 MED ORDER — INSULIN ASPART 100 UNIT/ML ~~LOC~~ SOLN
0.0000 [IU] | Freq: Three times a day (TID) | SUBCUTANEOUS | Status: DC
  Administered 2015-05-06 – 2015-05-09 (×8): 1 [IU] via SUBCUTANEOUS
  Filled 2015-05-05 (×8): qty 1

## 2015-05-05 MED ORDER — NORTRIPTYLINE HCL 25 MG PO CAPS: 25.0000 mg | ORAL_CAPSULE | Freq: Two times a day (BID) | ORAL | Status: DC | PRN

## 2015-05-05 MED ORDER — POLYETHYLENE GLYCOL 3350 17 G PO PACK: 17.0000 g | PACK | Freq: Every day | ORAL | Status: DC | PRN

## 2015-05-05 MED ORDER — INSULIN ASPART 100 UNIT/ML ~~LOC~~ SOLN: 0.0000 [IU] | Freq: Every day | SUBCUTANEOUS | Status: DC

## 2015-05-05 MED ORDER — IOHEXOL 300 MG/ML  SOLN
125.0000 mL | Freq: Once | INTRAMUSCULAR | Status: AC | PRN
  Administered 2015-05-05: 150 mL via INTRAVENOUS
  Filled 2015-05-05: qty 125

## 2015-05-05 MED ORDER — LAMOTRIGINE 100 MG PO TABS
100.0000 mg | ORAL_TABLET | Freq: Every day | ORAL | Status: DC
  Administered 2015-05-05 – 2015-05-09 (×5): 100 mg via ORAL
  Filled 2015-05-05 (×5): qty 1

## 2015-05-05 MED ORDER — ZOLPIDEM TARTRATE 5 MG PO TABS: 5.0000 mg | ORAL_TABLET | Freq: Every evening | ORAL | Status: DC | PRN

## 2015-05-05 MED ORDER — CLONIDINE HCL 0.1 MG PO TABS
0.1000 mg | ORAL_TABLET | Freq: Once | ORAL | Status: AC
  Administered 2015-05-05: 0.1 mg via ORAL
  Filled 2015-05-05: qty 1

## 2015-05-05 MED ORDER — LISINOPRIL 20 MG PO TABS
20.0000 mg | ORAL_TABLET | Freq: Every day | ORAL | Status: DC
Start: 2015-05-05 — End: 2015-05-09
  Administered 2015-05-05 – 2015-05-09 (×5): 20 mg via ORAL
  Filled 2015-05-05 (×5): qty 1

## 2015-05-05 MED ORDER — LORAZEPAM 2 MG/ML IJ SOLN
2.0000 mg | Freq: Once | INTRAMUSCULAR | Status: DC
  Filled 2015-05-05: qty 1

## 2015-05-05 MED ORDER — MORPHINE SULFATE (PF) 4 MG/ML IV SOLN
4.0000 mg | Freq: Once | INTRAVENOUS | Status: AC
  Administered 2015-05-05: 4 mg via INTRAVENOUS

## 2015-05-05 MED ORDER — PANTOPRAZOLE SODIUM 40 MG IV SOLR
40.0000 mg | Freq: Once | INTRAVENOUS | Status: AC
  Administered 2015-05-05: 40 mg via INTRAVENOUS
  Filled 2015-05-05: qty 40

## 2015-05-05 MED ORDER — SODIUM CHLORIDE 0.9 % IV SOLN
INTRAVENOUS | Status: DC
  Administered 2015-05-05 – 2015-05-07 (×5): via INTRAVENOUS

## 2015-05-05 MED ORDER — METOPROLOL SUCCINATE ER 50 MG PO TB24
50.0000 mg | ORAL_TABLET | Freq: Every day | ORAL | Status: DC
  Administered 2015-05-05 – 2015-05-09 (×5): 50 mg via ORAL
  Filled 2015-05-05 (×5): qty 1

## 2015-05-05 MED ORDER — MORPHINE SULFATE (PF) 4 MG/ML IV SOLN
INTRAVENOUS | Status: AC
  Filled 2015-05-05: qty 1

## 2015-05-05 MED ORDER — PANTOPRAZOLE SODIUM 40 MG PO TBEC
40.0000 mg | DELAYED_RELEASE_TABLET | Freq: Two times a day (BID) | ORAL | Status: DC
  Administered 2015-05-05 – 2015-05-08 (×7): 40 mg via ORAL
  Filled 2015-05-05 (×7): qty 1

## 2015-05-05 MED ORDER — DOCUSATE SODIUM 100 MG PO CAPS
100.0000 mg | ORAL_CAPSULE | Freq: Two times a day (BID) | ORAL | Status: DC
Start: 2015-05-05 — End: 2015-05-09
  Administered 2015-05-05 – 2015-05-08 (×7): 100 mg via ORAL
  Filled 2015-05-05 (×7): qty 1

## 2015-05-05 MED ORDER — SODIUM CHLORIDE 0.9 % IV BOLUS (SEPSIS)
1000.0000 mL | Freq: Once | INTRAVENOUS | Status: AC
  Administered 2015-05-05: 1000 mL via INTRAVENOUS

## 2015-05-05 MED ORDER — ONDANSETRON 4 MG PO TBDP
4.0000 mg | ORAL_TABLET | Freq: Once | ORAL | Status: AC
  Administered 2015-05-05: 4 mg via ORAL

## 2015-05-05 MED ORDER — ONDANSETRON HCL 4 MG PO TABS
4.0000 mg | ORAL_TABLET | Freq: Four times a day (QID) | ORAL | Status: DC | PRN
  Administered 2015-05-06: 4 mg via ORAL
  Filled 2015-05-05 (×2): qty 1

## 2015-05-05 MED ORDER — IOHEXOL 240 MG/ML SOLN
25.0000 mL | Freq: Once | INTRAMUSCULAR | Status: AC | PRN
  Administered 2015-05-05: 25 mL via ORAL
  Filled 2015-05-05: qty 25

## 2015-05-05 NOTE — ED Provider Notes (Signed)
[facility] Emergency Department Provider Note    ____________________________________________  Time seen: 1655  I have reviewed the triage vital signs and the nursing notes.   HISTORY  Chief Complaint Abdominal Pain   History limited by: Not Limited   HPI Daisy [name] is a 49 y.o. female who presents to the emergency department today because of concerns for abdominal pain, nausea and vomiting. The patient states that the symptoms started this morning. They have gotten progressively worse. They have been severe. They have been constant. The patient states that she has had similar pain in the past. She states she has had multiple abdominal surgeries and has 2 hernias in her abdomen. The patient states that her last bowel movement was this morning was normal. She denies any blood or diarrhea. She states she has been passing gas today.   Past Medical History  Diagnosis Date  . Diabetes mellitus without complication (HCC)   . Depression   . Hypertension   . Anxiety   . GERD (gastroesophageal reflux disease)   . Migraine     Patient Active Problem List   Diagnosis Date Noted  . Gastritis 01/07/2015  . DDD (degenerative disc disease), lumbar 11/28/2014  . DJD (degenerative joint disease) of knee 11/28/2014  . Migraine headache 11/28/2014  . Bilateral occipital neuralgia 11/28/2014  . Morbid obesity (HCC) 11/28/2014  . Neuropathy due to secondary diabetes (HCC) 11/28/2014    Past Surgical History  Procedure Laterality Date  . Hernia repair    . Appendectomy    . Cholecystectomy    . Knee surgery      Current Outpatient Rx  Name  Route  Sig  Dispense  Refill  . alprazolam (XANAX) 2 MG tablet   Oral   Take 2 mg by mouth 3 (three) times daily as needed for anxiety.          . Butalbital-APAP-Caffeine 50-300-40 MG CAPS   Oral   Take 1 capsule by mouth every 6 (six) hours as needed (for headaches).          . clindamycin (CLEOCIN) 300 MG  capsule   Oral   Take 300 mg by mouth 3 (three) times daily. Take 2 tabs 3 times per day         . doxycycline (DORYX) 100 MG DR capsule   Oral   Take 100 mg by mouth 2 (two) times daily.         Marland Kitchen FLUoxetine (PROZAC) 20 MG capsule   Oral   Take 20 mg by mouth daily.         . furosemide (LASIX) 20 MG tablet   Oral   Take 20 mg by mouth every other day.          . lamoTRIgine (LAMICTAL) 100 MG tablet   Oral   Take 100 mg by mouth daily.         Marland Kitchen levofloxacin (LEVAQUIN) 750 MG tablet   Oral   Take 750 mg by mouth 4 (four) times daily.         Marland Kitchen lisinopril (PRINIVIL,ZESTRIL) 20 MG tablet   Oral   Take 20 mg by mouth daily.         . metoprolol succinate (TOPROL-XL) 25 MG 24 hr tablet   Oral   Take 50 mg by mouth daily.         . nortriptyline (PAMELOR) 25 MG capsule   Oral   Take 25 mg by mouth 2 (two)  times daily as needed (anxiety). As needed         . ondansetron (ZOFRAN) 4 MG tablet   Oral   Take 1 tablet (4 mg total) by mouth every 6 (six) hours as needed for nausea.   20 tablet   0   . oxyCODONE (OXY IR/ROXICODONE) 5 MG immediate release tablet   Oral   Take 1 tablet (5 mg total) by mouth every 4 (four) hours as needed for moderate pain.   16 tablet   0   . Oxycodone HCl 10 MG TABS      Limit one half to 1 tab to 2 - 4   times per day if tolerated   120 tablet   0   . pantoprazole (PROTONIX) 40 MG tablet   Oral   Take 40 mg by mouth 2 (two) times daily.          Marland Kitchen tiZANidine (ZANAFLEX) 4 MG capsule      Limited 1 tab by mouth 2-4 times a day if tolerated   120 capsule   0   . tiZANidine (ZANAFLEX) 4 MG tablet   Oral   Take 4 mg by mouth 4 (four) times daily as needed for muscle spasms. 2 to 4 times daily PRN         . traZODone (DESYREL) 100 MG tablet   Oral   Take 100 mg by mouth at bedtime.         Marland Kitchen zolpidem (AMBIEN CR) 12.5 MG CR tablet   Oral   Take 12.5 mg by mouth at bedtime as needed for sleep.            Allergies Acetaminophen-codeine; Codeine; Ibuprofen; Morphine; Propoxyphene; and Sulfa antibiotics  Family History  Problem Relation Age of Onset  . Arthritis Mother   . Depression Mother   . Diabetes Mother   . Heart disease Mother   . Hyperlipidemia Mother   . Hypertension Mother   . Stroke Mother   . Arthritis Father   . Diabetes Father   . Hearing loss Father   . Heart disease Father   . Hyperlipidemia Father   . Hypertension Father     Social History Social History  Substance Use Topics  . Smoking status: Never Smoker   . Smokeless tobacco: None  . Alcohol Use: 0.0 oz/week    0 Standard drinks or equivalent per week     Comment: occasional    Review of Systems  Constitutional: Negative for fever. Cardiovascular: Negative for chest pain. Respiratory: Negative for shortness of breath. Gastrointestinal: Positive for abdominal pain, vomiting. Genitourinary: Negative for dysuria. Musculoskeletal: Negative for back pain. Skin: Negative for rash. Neurological: Negative for headaches, focal weakness or numbness.  10-point ROS otherwise negative.  ____________________________________________   PHYSICAL EXAM:  VITAL SIGNS: ED Triage Vitals  Enc Vitals Group     BP 05/05/15 1439 203/99 mmHg     Pulse Rate 05/05/15 1439 80     Resp 05/05/15 1439 20     Temp 05/05/15 1439 98.9 F (37.2 C)     Temp Source 05/05/15 1439 Oral     SpO2 05/05/15 1439 97 %     Weight 05/05/15 1439 285 lb (129.275 kg)     Height 05/05/15 1439 5\' 4"  (1.626 m)     Head Cir --      Peak Flow --      Pain Score 05/05/15 1440 10   Constitutional: Alert and oriented. Well appearing and  in no distress. Eyes: Conjunctivae are normal. PERRL. Normal extraocular movements. ENT   Head: Normocephalic and atraumatic.   Nose: No congestion/rhinnorhea.   Mouth/Throat: Mucous membranes are moist.   Neck: No stridor. Hematological/Lymphatic/Immunilogical: No cervical  lymphadenopathy. Cardiovascular: Normal rate, regular rhythm.  No murmurs, rubs, or gallops. Respiratory: Normal respiratory effort without tachypnea nor retractions. Breath sounds are clear and equal bilaterally. No wheezes/rales/rhonchi. Gastrointestinal: Soft. Tender to palpation diffusely. No rebound. No guarding.  Genitourinary: Deferred Musculoskeletal: Normal range of motion in all extremities. No joint effusions.  No lower extremity tenderness nor edema. Neurologic:  Normal speech and language. No gross focal neurologic deficits are appreciated.  Skin:  Skin is warm, dry and intact. No rash noted. Psychiatric: Mood and affect are normal. Speech and behavior are normal. Patient exhibits appropriate insight and judgment.  ____________________________________________    LABS (pertinent positives/negatives)  Labs Reviewed  COMPREHENSIVE METABOLIC PANEL - Abnormal; Notable for the following:    Sodium 130 (*)    Chloride 93 (*)    Glucose, Bld 168 (*)    Total Protein 8.9 (*)    All other components within normal limits  CBC - Abnormal; Notable for the following:    WBC 11.5 (*)    RDW 16.8 (*)    All other components within normal limits  URINALYSIS COMPLETEWITH MICROSCOPIC ([facility] ONLY) - Abnormal; Notable for the following:    Color, Urine YELLOW (*)    APPearance HAZY (*)    Ketones, ur 1+ (*)    Protein, ur 100 (*)    Leukocytes, UA 1+ (*)    Bacteria, UA RARE (*)    Squamous Epithelial / LPF 6-30 (*)    All other components within normal limits  LIPASE, BLOOD  TROPONIN I    ____________________________________________   EKG  I, Phineas Semen, attending physician, personally viewed and interpreted this EKG  EKG Time: 1508 Rate: 82 Rhythm: NSR Axis: normal Intervals: qtc 464 QRS: narrow ST changes: no st elevation Impression: normal ekg ____________________________________________    RADIOLOGY  CT abd/pel IMPRESSION: No acute finding abdomen or  pelvis.  Large ventral hernia contains loops of bowel without obstruction.  Cystic lesion in the left adnexa has varied in size since 2015 and cannot be definitively characterized. It may represent a dilated fallopian tube remnant or emanate from the ovary. Nonemergent pelvic ultrasound could be used for further evaluation.  Small hiatal hernia.  Fatty infiltration of the liver.   ____________________________________________   PROCEDURES  Procedure(s) performed: Peripheral IV insertion  Critical Care performed: No  Angiocath insertion Performed by: Phineas Semen  Consent: Verbal consent obtained. Risks and benefits: risks, benefits and alternatives were discussed Time out: Immediately prior to procedure a "time out" was called to verify the correct patient, procedure, equipment, support staff and site/side marked as required.  Preparation: Patient was prepped and draped in the usual sterile fashion.  Vein Location: Right AC  Ultrasound Guided  Gauge: 20  Normal blood return and flush without difficulty Patient tolerance: Patient tolerated the procedure well with no immediate complications.    ____________________________________________   INITIAL IMPRESSION / ASSESSMENT AND PLAN / ED COURSE  Pertinent labs & imaging results that were available during my care of the patient were reviewed by me and considered in my medical decision making (see chart for details).  Patient presented to the emergency department today because of concerns for abdominal pain and nausea and vomiting. Patient has an extensive abdominal history. CT scan was performed which  showed a large ventral hernia without obstruction or other concerning acute findings. Patient was given multiple rounds pain medication here in the emergency department without relief. Will plan on admitting to the hospital service for further management.  ____________________________________________   FINAL  CLINICAL IMPRESSION(S) / ED DIAGNOSES  Final diagnoses:  Abdominal pain, unspecified abdominal location     Phineas SemenGraydon Vickey Ewbank, MD 05/05/15 2115

## 2015-05-05 NOTE — ED Notes (Signed)
Pt attempting to drink contrast but states that she has had CT before with no contrast. Encouraged her to drink as she can.

## 2015-05-05 NOTE — ED Notes (Signed)
Pr presents c/o n/v/abdominal pain since this morning. Pt states she was fine last night when she went to bed but woke up with pain and n/v. Pt thinks this could be related to hernia formed after she had appendectomy last year. Pt writhing and crying during assessment.

## 2015-05-05 NOTE — H&P (Signed)
[facility] - [place] at [facility]   PATIENT NAME: Daisy [name]    MR#:  [id number]  DATE OF BIRTH:  03/20/1966   DATE OF ADMISSION:  05/05/2015  PRIMARY CARE PHYSICIAN: Clydie Braun, MD   REQUESTING/REFERRING PHYSICIAN: Derrill Kay  CHIEF COMPLAINT:   Chief Complaint  Patient presents with  . Abdominal Pain    HISTORY OF PRESENT ILLNESS:  Daisy [name]  is a 49 y.o. female with a known history of abdominal pain, ventral hernia,  type 2 diabetes non-insulin-requiring was presented with abdominal pain. She describes one day duration of abdominal pain and epigastric to right sided in location, dull/burning in quality 9/10 in intensity no worsening or relieving factors had some associated nausea with vomiting of nonbloody nonbilious emesis denies any issues of constipation or diarrhea. Denies fevers or chills further symptomatology. Emergency department course: Received multiple doses of pain medication with minimal relief, remainder of workup within normal limits. Ex line The patient is still having flatus, last bowel movement today  PAST MEDICAL HISTORY:   Past Medical History  Diagnosis Date  . Diabetes mellitus without complication (HCC)   . Depression   . Hypertension   . Anxiety   . GERD (gastroesophageal reflux disease)   . Migraine   . Obesity     PAST SURGICAL HISTORY:   Past Surgical History  Procedure Laterality Date  . Hernia repair    . Appendectomy    . Cholecystectomy    . Knee surgery    . Abdominal hysterectomy      SOCIAL HISTORY:   Social History  Substance Use Topics  . Smoking status: Never Smoker   . Smokeless tobacco: Not on file  . Alcohol Use: 0.0 oz/week    0 Standard drinks or equivalent per week     Comment: occasional    FAMILY HISTORY:   Family History  Problem Relation Age of Onset  . Arthritis Mother   . Depression Mother   . Diabetes Mother   . Heart disease Mother   . Hyperlipidemia Mother    . Hypertension Mother   . Stroke Mother   . Arthritis Father   . Diabetes Father   . Hearing loss Father   . Heart disease Father   . Hyperlipidemia Father   . Hypertension Father     DRUG ALLERGIES:   Allergies  Allergen Reactions  . Acetaminophen-Codeine Hives  . Codeine Hives  . Ibuprofen Nausea And Vomiting  . Morphine Nausea And Vomiting  . Propoxyphene Other (See Comments)    Reaction:  GI upset   . Sulfa Antibiotics Hives    REVIEW OF SYSTEMS:  REVIEW OF SYSTEMS:  CONSTITUTIONAL: Denies fevers, chills, fatigue, weakness.  EYES: Denies blurred vision, double vision, or eye pain.  EARS, NOSE, THROAT: Denies tinnitus, ear pain, hearing loss.  RESPIRATORY: denies cough, shortness of breath, wheezing  CARDIOVASCULAR: Denies chest pain, palpitations, edema.  GASTROINTESTINAL: Positive nausea, vomiting, , abdominal pain.  GENITOURINARY: Denies dysuria, hematuria.  ENDOCRINE: Denies nocturia or thyroid problems. HEMATOLOGIC AND LYMPHATIC: Denies easy bruising or bleeding.  SKIN: Denies rash or lesions.  MUSCULOSKELETAL: Denies pain in neck, back, shoulder, knees, hips, or further arthritic symptoms.  NEUROLOGIC: Denies paralysis, paresthesias.  PSYCHIATRIC: Denies anxiety or depressive symptoms. Otherwise full review of systems performed by me is negative.   MEDICATIONS AT HOME:   Prior to Admission medications   Medication Sig Start Date End Date Taking? Authorizing Provider  alprazolam Prudy Feeler) 2 MG tablet Take  2 mg by mouth 3 (three) times daily as needed for anxiety.    Yes Historical Provider, MD  FLUoxetine (PROZAC) 20 MG capsule Take 20 mg by mouth daily.   Yes Historical Provider, MD  furosemide (LASIX) 20 MG tablet Take 20 mg by mouth daily.    Yes Historical Provider, MD  lamoTRIgine (LAMICTAL) 100 MG tablet Take 100 mg by mouth daily.   Yes Historical Provider, MD  lisinopril (PRINIVIL,ZESTRIL) 20 MG tablet Take 20 mg by mouth daily.   Yes Historical  Provider, MD  metFORMIN (GLUCOPHAGE) 500 MG tablet Take 500 mg by mouth daily.   Yes Historical Provider, MD  metoprolol succinate (TOPROL-XL) 25 MG 24 hr tablet Take 50 mg by mouth daily.   Yes Historical Provider, MD  nortriptyline (PAMELOR) 25 MG capsule Take 25 mg by mouth 2 (two) times daily as needed (for anxiety).    Yes Historical Provider, MD  Oxycodone HCl 10 MG TABS Take 5-10 mg by mouth 4 (four) times daily as needed (for pain).   Yes Historical Provider, MD  pantoprazole (PROTONIX) 40 MG tablet Take 40 mg by mouth 2 (two) times daily.    Yes Historical Provider, MD  tiZANidine (ZANAFLEX) 4 MG tablet Take 4 mg by mouth 4 (four) times daily as needed for muscle spasms.   Yes Historical Provider, MD  traZODone (DESYREL) 100 MG tablet Take 100 mg by mouth at bedtime.   Yes Historical Provider, MD  zolpidem (AMBIEN CR) 12.5 MG CR tablet Take 12.5 mg by mouth at bedtime as needed for sleep.   Yes Historical Provider, MD  ondansetron (ZOFRAN) 4 MG tablet Take 1 tablet (4 mg total) by mouth every 6 (six) hours as needed for nausea. Patient not taking: Reported on 05/05/2015 01/09/15   Altamese DillingVaibhavkumar Vachhani, MD  oxyCODONE (OXY IR/ROXICODONE) 5 MG immediate release tablet Take 1 tablet (5 mg total) by mouth every 4 (four) hours as needed for moderate pain. Patient not taking: Reported on 05/05/2015 01/23/15   Ewing SchleinGregory Crisp, MD      VITAL SIGNS:  Blood pressure 180/90, pulse 90, temperature 98.9 F (37.2 C), temperature source Oral, resp. rate 18, height 5\' 4"  (1.626 m), weight 285 lb (129.275 kg), SpO2 100 %.  PHYSICAL EXAMINATION:  VITAL SIGNS: Filed Vitals:   05/05/15 1439 05/05/15 2105  BP: 203/99 180/90  Pulse: 80 90  Temp: 98.9 F (37.2 C)   Resp: 20 18   GENERAL:49 y.o.female currently in moderate acute distress. Given pain  HEAD: Normocephalic, atraumatic.  EYES: Pupils equal, round, reactive to light. Extraocular muscles intact. No scleral icterus.  MOUTH: Moist mucosal  membrane. Dentition intact. No abscess noted.  EAR, NOSE, THROAT: Clear without exudates. No external lesions.  NECK: Supple. No thyromegaly. No nodules. No JVD.  PULMONARY: Clear to ascultation, without wheeze rails or rhonci. No use of accessory muscles, Good respiratory effort. good air entry bilaterally CHEST: Nontender to palpation.  CARDIOVASCULAR: S1 and S2. Regular rate and rhythm. No murmurs, rubs, or gallops. No edema. Pedal pulses 2+ bilaterally.  GASTROINTESTINAL: Obese Soft, tender to palpation epigastric region without rebound, nondistended. No masses. Positive bowel sounds. No hepatosplenomegaly.  MUSCULOSKELETAL: No swelling, clubbing, or edema. Range of motion full in all extremities.  NEUROLOGIC: Cranial nerves II through XII are intact. No gross focal neurological deficits. Sensation intact. Reflexes intact.  SKIN: No ulceration, lesions, rashes, or cyanosis. Skin warm and dry. Turgor intact.  PSYCHIATRIC: Mood, affect within normal limits. The patient is awake, alert and  oriented x 3. Insight, judgment intact.    LABORATORY PANEL:   CBC  Recent Labs Lab 05/05/15 1446  WBC 11.5*  HGB 13.0  HCT 40.2  PLT 409   ------------------------------------------------------------------------------------------------------------------  Chemistries   Recent Labs Lab 05/05/15 1446  NA 130*  K 3.6  CL 93*  CO2 25  GLUCOSE 168*  BUN 7  CREATININE 0.72  CALCIUM 9.1  AST 35  ALT 35  ALKPHOS 67  BILITOT 0.5   ------------------------------------------------------------------------------------------------------------------  Cardiac Enzymes  Recent Labs Lab 05/05/15 1446  TROPONINI <0.03   ------------------------------------------------------------------------------------------------------------------  RADIOLOGY:  Ct Abdomen Pelvis W Contrast  05/05/2015  CLINICAL DATA:  Right side abdominal pain, nausea and vomiting beginning this morning. Initial encounter.  EXAM: CT ABDOMEN AND PELVIS WITH CONTRAST TECHNIQUE: Multidetector CT imaging of the abdomen and pelvis was performed using the standard protocol following bolus administration of intravenous contrast. CONTRAST:  150 mL OMNIPAQUE IOHEXOL 300 MG/ML  SOLN COMPARISON:  CT abdomen and pelvis 08/23/2014 and 01/07/2015. FINDINGS: The lung bases are clear.  No pleural or pericardial effusion. The liver is somewhat low attenuating consistent with fatty infiltration. The gallbladder has been removed. The adrenal glands, spleen, pancreas and kidneys are unremarkable. As seen on the prior examinations, the patient has a large ventral hernia containing loops of bowel without obstruction or other complicating feature. Small hiatal hernia is noted. The stomach is otherwise unremarkable. The patient is status post appendectomy. Surgical anastomosis is seen in the right lower quadrant. The patient is status post hysterectomy. Cystic lesion in the left side of the pelvis measures 5.3 cm AP by 3.0 cm transverse and is not markedly changed compared to the most recent examination. It has varied in size since studies dated 11/06/2013. No lymphadenopathy or fluid collection is identified. No focal bony abnormality is seen. IMPRESSION: No acute finding abdomen or pelvis. Large ventral hernia contains loops of bowel without obstruction. Cystic lesion in the left adnexa has varied in size since 2015 and cannot be definitively characterized. It may represent a dilated fallopian tube remnant or emanate from the ovary. Nonemergent pelvic ultrasound could be used for further evaluation. Small hiatal hernia. Fatty infiltration of the liver. Electronically Signed   By: Drusilla Kanner M.D.   On: 05/05/2015 19:44    EKG:   Orders placed or performed during the hospital encounter of 05/05/15  . ED EKG  . ED EKG  . EKG 12-Lead  . EKG 12-Lead    IMPRESSION AND PLAN:   49 year old African-American female history of total hysterectomy,  appendectomy, cholecystectomy as well as type 2 diabetes presenting for abdominal pain. Received multiple doses of pain medication emergency department still symptomatic  1. Intractable abdominal pain: Unclear etiology at this time given workup has been negative thus far, hernia does not appear incarcerated or with complication on CAT scan [name] check lactic acid just for completeness sake. If it is normal we'll not consult surgery however if positive will do surgery's input. Provide pain medication as well as a GI cocktail and PPI therapy, initiate bowel regiment  2. Type 2 diabetes non-insulin-requiring: Hold oral agents at insulin sliding scale 3. Hypokalemia: Replace potassium to 4-5 4. Hypertensive urgency: [name] add when necessary hydralazine however pain with likely elevated blood pressure 5. Venous thromboembolism prophylactic: Heparin subcutaneous    All the records are reviewed and case discussed with ED provider. Management plans discussed with the patient, family and they are in agreement.  CODE STATUS: Full  TOTAL TIME TAKING CARE  OF THIS PATIENT: 35 minutes.    Hower,  Mardi Mainland.D on 05/05/2015 at 9:33 PM  Between 7am to 6pm - Pager - [phone]  After 6pm: House Pager: - [phone]  [facility]  Office  [phone]  CC: Primary care physician; Clydie Braun, MD

## 2015-05-05 NOTE — ED Notes (Addendum)
Pt to ed with c/o right side abd pain, and nausea and vomiting.  Pt reports hx of hernias. Pt vomiting yellow colored vomitus

## 2015-05-06 ENCOUNTER — Observation Stay: Payer: Medicare Other

## 2015-05-06 DIAGNOSIS — R1013 Epigastric pain: Secondary | ICD-10-CM | POA: Diagnosis not present

## 2015-05-06 LAB — COMPREHENSIVE METABOLIC PANEL
ALK PHOS: 55 U/L (ref 38–126)
ALT: 32 U/L (ref 14–54)
ANION GAP: 9 (ref 5–15)
AST: 29 U/L (ref 15–41)
Albumin: 3.6 g/dL (ref 3.5–5.0)
BILIRUBIN TOTAL: 0.4 mg/dL (ref 0.3–1.2)
BUN: 7 mg/dL (ref 6–20)
CALCIUM: 8.7 mg/dL — AB (ref 8.9–10.3)
CO2: 27 mmol/L (ref 22–32)
Chloride: 99 mmol/L — ABNORMAL LOW (ref 101–111)
Creatinine, Ser: 0.64 mg/dL (ref 0.44–1.00)
GFR calc non Af Amer: >60 mL/min (ref >60)
Glucose, Bld: 158 mg/dL — ABNORMAL HIGH (ref 65–99)
POTASSIUM: 3.7 mmol/L (ref 3.5–5.1)
Sodium: 135 mmol/L (ref 135–145)
TOTAL PROTEIN: 7.8 g/dL (ref 6.5–8.1)

## 2015-05-06 LAB — CBC
HEMATOCRIT: 40.6 % (ref 35.0–47.0)
HEMOGLOBIN: 12.8 g/dL (ref 12.0–16.0)
MCH: 27.1 pg (ref 26.0–34.0)
MCHC: 31.6 g/dL — AB (ref 32.0–36.0)
MCV: 85.6 fL (ref 80.0–100.0)
Platelets: 366 10*3/uL (ref 150–440)
RBC: 4.74 MIL/uL (ref 3.80–5.20)
RDW: 17.2 % — AB (ref 11.5–14.5)
WBC: 12.1 10*3/uL — ABNORMAL HIGH (ref 3.6–11.0)

## 2015-05-06 LAB — GLUCOSE, CAPILLARY
GLUCOSE-CAPILLARY: 111 mg/dL — AB (ref 65–99)
GLUCOSE-CAPILLARY: 122 mg/dL — AB (ref 65–99)
GLUCOSE-CAPILLARY: 134 mg/dL — AB (ref 65–99)
Glucose-Capillary: 185 mg/dL — ABNORMAL HIGH (ref 65–99)

## 2015-05-06 LAB — LACTIC ACID, PLASMA
Lactic Acid, Venous: 1.1 mmol/L (ref 0.5–2.0)
Lactic Acid, Venous: 1.5 mmol/L (ref 0.5–2.0)

## 2015-05-06 MED ORDER — PROMETHAZINE HCL 25 MG RE SUPP
25.0000 mg | Freq: Four times a day (QID) | RECTAL | Status: DC | PRN
Start: 2015-05-06 — End: 2015-05-09
  Administered 2015-05-06: 25 mg via RECTAL
  Filled 2015-05-06: qty 1

## 2015-05-06 MED ORDER — PROMETHAZINE HCL 25 MG PO TABS
12.5000 mg | ORAL_TABLET | Freq: Four times a day (QID) | ORAL | Status: DC | PRN
  Administered 2015-05-06 – 2015-05-07 (×2): 12.5 mg via ORAL
  Filled 2015-05-06 (×2): qty 1

## 2015-05-06 MED ORDER — HYDRALAZINE HCL 50 MG PO TABS
50.0000 mg | ORAL_TABLET | Freq: Three times a day (TID) | ORAL | Status: DC
  Administered 2015-05-06 – 2015-05-09 (×9): 50 mg via ORAL
  Filled 2015-05-06 (×9): qty 1

## 2015-05-06 NOTE — Progress Notes (Signed)
Notified Dr Clent RidgesWalsh of pt BP 190/110 pre medication; dr acknowledged

## 2015-05-06 NOTE — Progress Notes (Signed)
[facility] - [place] at [facility]   PATIENT NAME: Daisy [name]    MR#:  [id number]  DATE OF BIRTH:  05-Mar-1966  SUBJECTIVE:  CHIEF COMPLAINT:   Chief Complaint  Patient presents with  . Abdominal Pain   Crying, very uncomfortable. States that she continues to have right-sided abdominal pain and nausea and vomiting.  REVIEW OF SYSTEMS:   Review of Systems  Constitutional: Negative for fever.  Respiratory: Negative for shortness of breath.   Cardiovascular: Negative for chest pain and palpitations.  Gastrointestinal: Positive for nausea, vomiting and abdominal pain. Negative for diarrhea, constipation, blood in stool and melena.  Genitourinary: Negative for dysuria.   DRUG ALLERGIES:   Allergies  Allergen Reactions  . Acetaminophen-Codeine Hives  . Codeine Hives  . Ibuprofen Nausea And Vomiting  . Morphine Nausea And Vomiting  . Propoxyphene Other (See Comments)    Reaction:  GI upset   . Sulfa Antibiotics Hives    VITALS:  Blood pressure 178/95, pulse 84, temperature 98.4 F (36.9 C), temperature source Oral, resp. rate 18, height 5\' 4"  (1.626 m), weight 129.275 kg (285 lb), SpO2 97 %.  PHYSICAL EXAMINATION:  GENERAL:  49 y.o.-year-old patient lying in the bed, very uncomfortable LUNGS: Normal breath sounds bilaterally, no wheezing, rales,rhonchi or crepitation. No use of accessory muscles of respiration.  CARDIOVASCULAR: S1, S2 normal. No murmurs, rubs, or gallops.  ABDOMEN: Soft, tender particularly over the right upper quadrant and mid epigastric area, nondistended. Bowel sounds present. No organomegaly or mass. There is guarding, no rebound EXTREMITIES: No pedal edema, cyanosis, or clubbing.  NEUROLOGIC: Cranial nerves II through XII are intact. Muscle strength 5/5 in all extremities. Sensation intact. Gait not checked.  PSYCHIATRIC: The patient is alert and oriented x 3. Anxious SKIN: No obvious rash, lesion, or ulcer.    LABORATORY  PANEL:   CBC  Recent Labs Lab 05/06/15 0140  WBC 12.1*  HGB 12.8  HCT 40.6  PLT 366   ------------------------------------------------------------------------------------------------------------------  Chemistries   Recent Labs Lab 05/06/15 0140  NA 135  K 3.7  CL 99*  CO2 27  GLUCOSE 158*  BUN 7  CREATININE 0.64  CALCIUM 8.7*  AST 29  ALT 32  ALKPHOS 55  BILITOT 0.4   ------------------------------------------------------------------------------------------------------------------  Cardiac Enzymes  Recent Labs Lab 05/05/15 1446  TROPONINI <0.03   ------------------------------------------------------------------------------------------------------------------  RADIOLOGY:  Ct Abdomen Pelvis W Contrast  05/05/2015  CLINICAL DATA:  Right side abdominal pain, nausea and vomiting beginning this morning. Initial encounter. EXAM: CT ABDOMEN AND PELVIS WITH CONTRAST TECHNIQUE: Multidetector CT imaging of the abdomen and pelvis was performed using the standard protocol following bolus administration of intravenous contrast. CONTRAST:  150 mL OMNIPAQUE IOHEXOL 300 MG/ML  SOLN COMPARISON:  CT abdomen and pelvis 08/23/2014 and 01/07/2015. FINDINGS: The lung bases are clear.  No pleural or pericardial effusion. The liver is somewhat low attenuating consistent with fatty infiltration. The gallbladder has been removed. The adrenal glands, spleen, pancreas and kidneys are unremarkable. As seen on the prior examinations, the patient has a large ventral hernia containing loops of bowel without obstruction or other complicating feature. Small hiatal hernia is noted. The stomach is otherwise unremarkable. The patient is status post appendectomy. Surgical anastomosis is seen in the right lower quadrant. The patient is status post hysterectomy. Cystic lesion in the left side of the pelvis measures 5.3 cm AP by 3.0 cm transverse and is not markedly changed compared to the most recent  examination. It has varied  in size since studies dated 11/06/2013. No lymphadenopathy or fluid collection is identified. No focal bony abnormality is seen. IMPRESSION: No acute finding abdomen or pelvis. Large ventral hernia contains loops of bowel without obstruction. Cystic lesion in the left adnexa has varied in size since 2015 and cannot be definitively characterized. It may represent a dilated fallopian tube remnant or emanate from the ovary. Nonemergent pelvic ultrasound could be used for further evaluation. Small hiatal hernia. Fatty infiltration of the liver. Electronically Signed   By: Drusilla Kanner M.D.   On: 05/05/2015 19:44    EKG:   Orders placed or performed during the hospital encounter of 05/05/15  . ED EKG  . ED EKG  . EKG 12-Lead  . EKG 12-Lead    ASSESSMENT AND PLAN:   1. Abdominal pain with nausea and vomiting: CT scan of the abdomen is essentially normal with possible cystic lesion in the left adnexa. Symptoms started 24 hours ago. Likely a viral gastroenteritis. Continue with symptom control Phenergan, Zofran, morphine as needed. Continue IV hydration. Will obtain transvaginal ultrasound to further evaluate the left adnexal finding. Continue bowel regimen. In reviewing her notes she does have a history of prior intra-abdominal infections and seroma.  2. Diabetes mellitus type 2: Continue sliding scale insulin. Blood sugars have been well controlled.  3. Hypertension: Elevated possibly due to pain. Continue lisinopril, furosemide, metoprolol. Add 3 times a day hydralazine. Continue as needed hydralazine IV.  Note that this patient has a concerning history of opiate and benzodiazepine abuse. She specifically asks for IV Phenergan given at the same time as her IV Dilaudid. She is intolerant of all pain medications other than Dilaudid. At this time we are giving Phenergan as a suppository due to fairly poor venous access. Using opiates with caution.  All the records are  reviewed and case discussed with Care Management/Social Workerr. Management plans discussed with the patient, family and they are in agreement.  CODE STATUS: Full  TOTAL TIME TAKING CARE OF THIS PATIENT: 25 minutes.  Greater than 50% of time spent in care coordination and counseling. POSSIBLE D/C IN 1-2 DAYS, DEPENDING ON CLINICAL CONDITION.   Elby Showers M.D on 05/06/2015 at 4:10 PM  Between 7am to 6pm - Pager - [phone]  After 6pm go to [email or web address] - password [email or web address] [facility]  [facility]  Office  [phone]  CC: Primary care physician; Clydie Braun, MD

## 2015-05-06 NOTE — Progress Notes (Signed)
Notified Dr Clent RidgesWalsh of pt c/o nausea and pt reports vomiting x2; told Dr that pt had received zofran this AM and nausea still present; Dr acknowledged, stated she would put in orders

## 2015-05-07 DIAGNOSIS — R1013 Epigastric pain: Secondary | ICD-10-CM | POA: Diagnosis not present

## 2015-05-07 LAB — CBC
HEMATOCRIT: 36.1 % (ref 35.0–47.0)
Hemoglobin: 11.6 g/dL — ABNORMAL LOW (ref 12.0–16.0)
MCH: 27.1 pg (ref 26.0–34.0)
MCHC: 32 g/dL (ref 32.0–36.0)
MCV: 84.7 fL (ref 80.0–100.0)
PLATELETS: 383 10*3/uL (ref 150–440)
RBC: 4.27 MIL/uL (ref 3.80–5.20)
RDW: 16.9 % — AB (ref 11.5–14.5)
WBC: 12 10*3/uL — AB (ref 3.6–11.0)

## 2015-05-07 LAB — GLUCOSE, CAPILLARY
GLUCOSE-CAPILLARY: 127 mg/dL — AB (ref 65–99)
GLUCOSE-CAPILLARY: 125 mg/dL — AB (ref 65–99)
Glucose-Capillary: 114 mg/dL — ABNORMAL HIGH (ref 65–99)
Glucose-Capillary: 131 mg/dL — ABNORMAL HIGH (ref 65–99)

## 2015-05-07 LAB — BASIC METABOLIC PANEL
ANION GAP: 9 (ref 5–15)
BUN: 9 mg/dL (ref 6–20)
CO2: 27 mmol/L (ref 22–32)
Calcium: 8.5 mg/dL — ABNORMAL LOW (ref 8.9–10.3)
Chloride: 100 mmol/L — ABNORMAL LOW (ref 101–111)
Creatinine, Ser: 0.67 mg/dL (ref 0.44–1.00)
GFR calc Af Amer: >60 mL/min (ref >60)
GLUCOSE: 128 mg/dL — AB (ref 65–99)
POTASSIUM: 3.5 mmol/L (ref 3.5–5.1)
Sodium: 136 mmol/L (ref 135–145)

## 2015-05-07 MED ORDER — ENOXAPARIN SODIUM 40 MG/0.4ML ~~LOC~~ SOLN
40.0000 mg | SUBCUTANEOUS | Status: DC
  Administered 2015-05-07 – 2015-05-08 (×2): 40 mg via SUBCUTANEOUS
  Filled 2015-05-07 (×2): qty 0.4

## 2015-05-07 MED ORDER — SUCRALFATE 1 GM/10ML PO SUSP
1.0000 g | Freq: Three times a day (TID) | ORAL | Status: DC
  Administered 2015-05-07 – 2015-05-08 (×5): 1 g via ORAL
  Filled 2015-05-07 (×5): qty 10

## 2015-05-07 NOTE — Consult Note (Signed)
GYN BRIEF NOTE:  Received page to consult on this 49yo female admitted with RUQ pain and known, stable 5cm left complex ovarian cyst who is s/p TAH/RSO in 2002. She is a known patient of Dr. Greggory Keenefrancesco. We will defer to that group for the consult as she is their private patient and had been scheduled for surgery in September according to patient and care everywhere.  Ala DachJohanna K Megean Fabio, MD

## 2015-05-07 NOTE — Progress Notes (Signed)
[id number]

## 2015-05-07 NOTE — Progress Notes (Signed)
Abdominal pain continues,nausea continue,iv fluids continues,tolerating diet,blood pressure improving.

## 2015-05-07 NOTE — Consult Note (Signed)
GI Inpatient Consult Note  History of Present Illness: Daisy [name] is a 49 y.o. female  who reports she started having abdominal pain on Sunday followed by nausea and vomiting on Monday.  She reports she was not feeling very well on Saturday but does not know anything that may have exacerbated her condition.  She describes abdominal pain as her entire right side as dull, achy, constant.  It is relieved for 2-3 hours with pain medication.  She reports vomiting all day on Monday, would start 10-15 minutes after trying to eat something.  She called ambulance service Monday evening and was brought to [facility] Emergency Department.  She reports a couple of episodes of vomiting yesterday, and his vomited a couple of times today.  Her nausea is much better with Phenergan.  She has tried a can only eat just a little bit of clear liquids.  She has a significant history of having an appendectomy completed in June, 2015,  Unfortunately it caused her to have some abdominal hernias.  One was repaired in February, 2016 and she had several complications from that.    She takes Protonix 40 mg twice a day reports it controls her heartburn and acid reflux symptoms.  She denies any changes to her bowel movements, last 1 was today.  She reports having a cholecystectomy as well.  She denies any NSAID use.  She has a significant history of stomach ulcers in her father.  She has had several upper endoscopies, 1 in April, 2013 for the indication of epigastric abdominal pain, nausea with vomiting with the findings of gastritis.  She had another upper endoscopy completed in February, 2014 for the indication of epigastric abdominal pain and nausea with vomiting.  Impression bile gastritis.  Her most recent upper endoscopy was completed on Oct 26, 2013 for the indication of epigastric abdominal pain, right upper quadrant pain.  Impression gastritis.  Her last colonoscopy was in July, 2014 for the  indication of generalized abdominal pain, family history of colonic polyps in a first-degree relative.  Impression the entire colon was normal.  She was recently hospitalized at [facility] for possible SIRS an October, 2016. She was first seen at [facility] ED due to syncope at pain clinic. She may have had an unintentional overdose.  She had been taking pain meds due to the abdominal pain she was experiencing , she was transferred to [facility] after [facility] ED spoke with Dr. Peri Jefferson in general surgery at [facility] about the ventral hernia. (He has been following her for this)  She is due to follow up with them again on 05/26/2015.  Past Medical History:  Past Medical History  Diagnosis Date  . Diabetes mellitus without complication (HCC)   . Depression   . Hypertension   . Anxiety   . GERD (gastroesophageal reflux disease)   . Migraine   . Obesity     Problem List: Patient Active Problem List   Diagnosis Date Noted  . Intractable abdominal pain 05/05/2015  . Gastritis 01/07/2015  . DDD (degenerative disc disease), lumbar 11/28/2014  . DJD (degenerative joint disease) of knee 11/28/2014  . Migraine headache 11/28/2014  . Bilateral occipital neuralgia 11/28/2014  . Morbid obesity (HCC) 11/28/2014  . Neuropathy due to secondary diabetes (HCC) 11/28/2014    Past Surgical History: Past Surgical History  Procedure Laterality Date  . Hernia repair    . Appendectomy    . Cholecystectomy    . Knee surgery    . Abdominal hysterectomy  Allergies: Allergies  Allergen Reactions  . Acetaminophen-Codeine Hives  . Codeine Hives  . Ibuprofen Nausea And Vomiting  . Morphine Nausea And Vomiting  . Propoxyphene Other (See Comments)    Reaction:  GI upset   . Sulfa Antibiotics Hives    Home Medications: Prescriptions prior to admission  Medication Sig Dispense Refill Last Dose  . alprazolam (XANAX) 2 MG tablet Take 2 mg by mouth 3 (three) times daily as needed for anxiety.    PRN at PRN  . FLUoxetine (PROZAC)  20 MG capsule Take 20 mg by mouth daily.   unknown at unknown  . furosemide (LASIX) 20 MG tablet Take 20 mg by mouth daily.    unknown at unknown  . lamoTRIgine (LAMICTAL) 100 MG tablet Take 100 mg by mouth daily.   unknown at unknown  . lisinopril (PRINIVIL,ZESTRIL) 20 MG tablet Take 20 mg by mouth daily.   unknown at unknown  . metFORMIN (GLUCOPHAGE) 500 MG tablet Take 500 mg by mouth daily.   unknown at unknown  . metoprolol succinate (TOPROL-XL) 25 MG 24 hr tablet Take 50 mg by mouth daily.   unknown at unknown  . nortriptyline (PAMELOR) 25 MG capsule Take 25 mg by mouth 2 (two) times daily as needed (for anxiety).    PRN at PRN  . Oxycodone HCl 10 MG TABS Take 5-10 mg by mouth 4 (four) times daily as needed (for pain).   PRN at PRN  . pantoprazole (PROTONIX) 40 MG tablet Take 40 mg by mouth 2 (two) times daily.    unknown at unknown  . tiZANidine (ZANAFLEX) 4 MG tablet Take 4 mg by mouth 4 (four) times daily as needed for muscle spasms.   PRN at PRN  . traZODone (DESYREL) 100 MG tablet Take 100 mg by mouth at bedtime.   unknown at unknown  . zolpidem (AMBIEN CR) 12.5 MG CR tablet Take 12.5 mg by mouth at bedtime as needed for sleep.   PRN at PRN  . ondansetron (ZOFRAN) 4 MG tablet Take 1 tablet (4 mg total) by mouth every 6 (six) hours as needed for nausea. (Patient not taking: Reported on 05/05/2015) 20 tablet 0 Taking  . oxyCODONE (OXY IR/ROXICODONE) 5 MG immediate release tablet Take 1 tablet (5 mg total) by mouth every 4 (four) hours as needed for moderate pain. (Patient not taking: Reported on 05/05/2015) 16 tablet 0 Taking   Home medication reconciliation was completed with the patient.   Scheduled Inpatient Medications:   . docusate sodium  100 mg Oral BID  . enoxaparin (LOVENOX) injection  40 mg Subcutaneous Q24H  . FLUoxetine  20 mg Oral Daily  . furosemide  20 mg Oral Daily  . hydrALAZINE  50 mg Oral 3 times per day  . insulin aspart  0-5 Units Subcutaneous QHS  . insulin  aspart  0-9 Units Subcutaneous TID WC  . lamoTRIgine  100 mg Oral Daily  . lisinopril  20 mg Oral Daily  . LORazepam  2 mg Intravenous Once  . metoprolol succinate  50 mg Oral Daily  . nitroGLYCERIN  1 inch Topical 4 times per day  . pantoprazole  40 mg Oral BID  . traZODone  100 mg Oral QHS    Continuous Inpatient Infusions:     PRN Inpatient Medications:  acetaminophen **OR** acetaminophen, alprazolam, diphenhydrAMINE, hydrALAZINE, HYDROmorphone (DILAUDID) injection, nortriptyline, ondansetron **OR** ondansetron (ZOFRAN) IV, oxyCODONE, polyethylene glycol, promethazine, promethazine, tiZANidine, zolpidem  Family History: family history includes Arthritis in her father  and mother; Depression in her mother; Diabetes in her father and mother; Hearing loss in her father; Heart disease in her father and mother; Hyperlipidemia in her father and mother; Hypertension in her father and mother; Stroke in her mother.    Social History:   reports that she has never smoked. She does not have any smokeless tobacco history on file. She reports that she drinks alcohol. She reports that she does not use illicit drugs.    Review of Systems: Constitutional: Weight is stable.  Eyes: No changes in vision. ENT: No oral lesions, sore throat.  GI: see HPI.  Heme/Lymph: No easy bruising.  CV: No chest pain.  GU: No hematuria.  Integumentary: No rashes.  Neuro: No headaches.  Psych: No depression/anxiety.  Endocrine: No heat/cold intolerance.  Allergic/Immunologic: No urticaria.  Resp: No cough, SOB.  Musculoskeletal: No joint swelling.    Physical Examination: BP 166/85 mmHg  Pulse 88  Temp(Src) 97.9 F (36.6 C) (Oral)  Resp 18  Ht 5\' 4"  (1.626 m)  Wt 129.275 kg (285 lb)  BMI 48.90 kg/m2  SpO2 98% Gen: NAD, alert and oriented x 4 HEENT: PEERLA, EOMI, Neck: supple, no JVD or thyromegaly Chest: CTA bilaterally, no wheezes, crackles, or other adventitious sounds CV: RRR, no m/g/c/r Abd:  obese, generalized tenderness, ND, +BS in all four quadrants; no HSM, guarding, ridigity, or rebound tenderness Ext: no edema, well perfused with 2+ pulses, Skin: no rash or lesions noted Lymph: no LAD  Data: Lab Results  Component Value Date   WBC 12.0* 05/07/2015   HGB 11.6* 05/07/2015   HCT 36.1 05/07/2015   MCV 84.7 05/07/2015   PLT 383 05/07/2015    Recent Labs Lab 05/05/15 1446 05/06/15 0140 05/07/15 0711  HGB 13.0 12.8 11.6*   Lab Results  Component Value Date   NA 136 05/07/2015   K 3.5 05/07/2015   CL 100* 05/07/2015   CO2 27 05/07/2015   BUN 9 05/07/2015   CREATININE 0.67 05/07/2015   Lab Results  Component Value Date   ALT 32 05/06/2015   AST 29 05/06/2015   ALKPHOS 55 05/06/2015   BILITOT 0.4 05/06/2015   No results for input(s): APTT, INR, PTT in the last 168 hours.  Imaging: CLINICAL DATA: Right side abdominal pain, nausea and vomiting beginning this morning. Initial encounter.  EXAM: CT ABDOMEN AND PELVIS WITH CONTRAST  TECHNIQUE: Multidetector CT imaging of the abdomen and pelvis was performed using the standard protocol following bolus administration of intravenous contrast.  CONTRAST: 150 mL OMNIPAQUE IOHEXOL 300 MG/ML SOLN  COMPARISON: CT abdomen and pelvis 08/23/2014 and 01/07/2015.  FINDINGS: The lung bases are clear. No pleural or pericardial effusion.  The liver is somewhat low attenuating consistent with fatty infiltration. The gallbladder has been removed. The adrenal glands, spleen, pancreas and kidneys are unremarkable.  As seen on the prior examinations, the patient has a large ventral hernia containing loops of bowel without obstruction or other complicating feature. Small hiatal hernia is noted. The stomach is otherwise unremarkable. The patient is status post appendectomy. Surgical anastomosis is seen in the right lower quadrant.  The patient is status post hysterectomy. Cystic lesion in the left side of  the pelvis measures 5.3 cm AP by 3.0 cm transverse and is not markedly changed compared to the most recent examination. It has varied in size since studies dated 11/06/2013. No lymphadenopathy or fluid collection is identified. No focal bony abnormality is seen.  IMPRESSION: No acute finding abdomen or pelvis.  Large ventral hernia contains loops of bowel without obstruction.  Cystic lesion in the left adnexa has varied in size since 2015 and cannot be definitively characterized. It may represent a dilated fallopian tube remnant or emanate from the ovary. Nonemergent pelvic ultrasound could be used for further evaluation.  Small hiatal hernia.  Fatty infiltration of the liver.   Electronically Signed  By: Drusilla Kanner M.D.  On: 05/05/2015 19:44  CLINICAL DATA: Patient with abnormal left adnexal mass.  EXAM: TRANSABDOMINAL AND TRANSVAGINAL ULTRASOUND OF PELVIS  TECHNIQUE: Both transabdominal and transvaginal ultrasound examinations of the pelvis were performed. Transabdominal technique was performed for global imaging of the pelvis including uterus, ovaries, adnexal regions, and pelvic cul-de-sac. It was necessary to proceed with endovaginal exam following the transabdominal exam to visualize the adnexal structures.  COMPARISON: CT abdomen pelvis 09/28/2011; 06/13/2012; 05/05/2015.  FINDINGS: Uterus  Surgically absent.  Right ovary  Surgically absent.  Left ovary  No definite ovarian tissue is visualized. Within the left adnexa there is a 5.2 x 3.2 x 4.2 cm complex cystic mass with internal debris and thin internal septations with color Doppler flow. The internal debris is mobile.  Other findings  No free fluid.  IMPRESSION: There is a complex mass within the left adnexa which contains mobile internal debris. A similar appearing mass was demonstrated on prior CTs and appears to wax and wane in size. Given overall  appearance, this may represent a dilated fallopian tube with proteinaceous debris or potentially a complex ovarian mass such as a hemorrhagic cyst. Malignancy cannot be excluded. Given the suggestion of internal septations with color flow, GYN surgical consultation is recommended. At a minimum, follow-up ultrasound in 6- 8 weeks is recommended.   Electronically Signed  By: Annia Belt M.D.  On: 05/06/2015 18:55  Assessment/Plan: Daisy [name] is a 49 y.o. female with chronic abdominal pain, history of ventral hernia repair, n/v for past three days.  Her WBC 12.1, BP 166/85.  She is being treated with pain meds, nausea meds and protonix 40 mg BID.   Recommendations: Continue with control of pain and nausea.  Recommend adding Carafate slurry 4 times daily.  Will discuss with Dr. Marva Panda who is on call tomorrow for need for further evaluation with EGD.  We will continue to follow with you. Thank you for the consult. Please call with questions or concerns.  Carney Harder, PA-C  I personally performed these services.

## 2015-05-07 NOTE — Care Management Obs Status (Signed)
MEDICARE OBSERVATION STATUS NOTIFICATION   Patient Details  Name: Daisy GoslingDanita A Obarr MRN: [id number] Date of Birth: [birth year]   Medicare Observation Status Notification Given:  Yes    Adonis HugueninBerkhead, [place] Monds L, RN 05/07/2015, 2:39 PM

## 2015-05-07 NOTE — Progress Notes (Signed)
[facility] - [place] at [facility]   PATIENT NAME: Daisy [name]    MR#:  [id number]  DATE OF BIRTH:  [birth year]  SUBJECTIVE:  CHIEF COMPLAINT:   Chief Complaint  Patient presents with  . Abdominal Pain   Continues to have right-sided abdominal pain and nausea but no vomiting.  REVIEW OF SYSTEMS:   Review of Systems  Constitutional: Negative for fever, weight loss, malaise/fatigue and diaphoresis.  HENT: Negative for ear discharge, ear pain, hearing loss, nosebleeds, sore throat and tinnitus.   Eyes: Negative for blurred vision and pain.  Respiratory: Negative for cough, hemoptysis, shortness of breath and wheezing.   Cardiovascular: Negative for chest pain, palpitations, orthopnea and leg swelling.  Gastrointestinal: Positive for nausea and abdominal pain. Negative for heartburn, vomiting, diarrhea, constipation, blood in stool and melena.  Genitourinary: Negative for dysuria, urgency and frequency.  Musculoskeletal: Negative for myalgias and back pain.  Skin: Negative for itching and rash.  Neurological: Negative for dizziness, tingling, tremors, focal weakness, seizures, weakness and headaches.  Psychiatric/Behavioral: Negative for depression. The patient is not nervous/anxious.    DRUG ALLERGIES:   Allergies  Allergen Reactions  . Acetaminophen-Codeine Hives  . Codeine Hives  . Ibuprofen Nausea And Vomiting  . Morphine Nausea And Vomiting  . Propoxyphene Other (See Comments)    Reaction:  GI upset   . Sulfa Antibiotics Hives    VITALS:  Blood pressure 166/85, pulse 88, temperature 97.9 F (36.6 C), temperature source Oral, resp. rate 18, height  (1.626 m), weight 129.275 kg (285 lb), SpO2 98 %.  PHYSICAL EXAMINATION:  GENERAL:  49 y.o.-year-old patient lying in the bed, very uncomfortable LUNGS: Normal breath sounds bilaterally, no wheezing, rales,rhonchi or crepitation. No use of accessory muscles of respiration.  CARDIOVASCULAR:  S1, S2 normal. No murmurs, rubs, or gallops.  ABDOMEN: Soft, tender particularly over the right upper quadrant and mid epigastric area, nondistended. Bowel sounds present. No organomegaly or mass. There is guarding, no rebound EXTREMITIES: No pedal edema, cyanosis, or clubbing.  NEUROLOGIC: Cranial nerves II through XII are intact. Muscle strength 5/5 in all extremities. Sensation intact. Gait not checked.  PSYCHIATRIC: The patient is alert and oriented x 3. Anxious SKIN: No obvious rash, lesion, or ulcer.  LABORATORY PANEL:   CBC  Recent Labs Lab 05/07/15 0711  WBC 12.0*  HGB 11.6*  HCT 36.1  PLT 383   ------------------------------------------------------------------------------------------------------------------  Chemistries   Recent Labs Lab 05/06/15 0140 05/07/15 0711  NA 135 136  K 3.7 3.5  CL 99* 100*  CO2 27 27  GLUCOSE 158* 128*  BUN 7 9  CREATININE 0.64 0.67  CALCIUM 8.7* 8.5*  AST 29  --   ALT 32  --   ALKPHOS 55  --   BILITOT 0.4  --    ------------------------------------------------------------------------------------------------------------------  Cardiac Enzymes  Recent Labs Lab 05/05/15 1446  TROPONINI <0.03   ------------------------------------------------------------------------------------------------------------------  RADIOLOGY:  US Transvaginal Non-ob  05/06/2015  CLINICAL DATA:  Patient with abnormal left adnexal mass. EXAM: TRANSABDOMINAL AND TRANSVAGINAL ULTRASOUND OF PELVIS TECHNIQUE: Both transabdominal and transvaginal ultrasound examinations of the pelvis were performed. Transabdominal technique was performed for global imaging of the pelvis including uterus, ovaries, adnexal regions, and pelvic cul-de-sac. It was necessary to proceed with endovaginal exam following the transabdominal exam to visualize the adnexal structures. COMPARISON:  CT abdomen pelvis 09/28/2011; 06/13/2012; 05/05/2015. FINDINGS: Uterus Surgically absent. Right  ovary Surgically absent. Left ovary No definite ovarian tissue is visualized. Within the left adnexa  there is a 5.2 x 3.2 x 4.2 cm complex cystic mass with internal debris and thin internal septations with color Doppler flow. The internal debris is mobile. Other findings No free fluid. IMPRESSION: There is a complex mass within the left adnexa which contains mobile internal debris. A similar appearing mass was demonstrated on prior CTs and appears to wax and wane in size. Given overall appearance, this may represent a dilated fallopian tube with proteinaceous debris or potentially a complex ovarian mass such as a hemorrhagic cyst. Malignancy cannot be excluded. Given the suggestion of internal septations with color flow, GYN surgical consultation is recommended. At a minimum, follow-up ultrasound in 6- 8 weeks is recommended. Electronically Signed   By: Annia Belt M.D.   On: 05/06/2015 18:55   US Pelvis Complete  05/06/2015  CLINICAL DATA:  Patient with abnormal left adnexal mass. EXAM: TRANSABDOMINAL AND TRANSVAGINAL ULTRASOUND OF PELVIS TECHNIQUE: Both transabdominal and transvaginal ultrasound examinations of the pelvis were performed. Transabdominal technique was performed for global imaging of the pelvis including uterus, ovaries, adnexal regions, and pelvic cul-de-sac. It was necessary to proceed with endovaginal exam following the transabdominal exam to visualize the adnexal structures. COMPARISON:  CT abdomen pelvis 09/28/2011; 06/13/2012; 05/05/2015. FINDINGS: Uterus Surgically absent. Right ovary Surgically absent. Left ovary No definite ovarian tissue is visualized. Within the left adnexa there is a 5.2 x 3.2 x 4.2 cm complex cystic mass with internal debris and thin internal septations with color Doppler flow. The internal debris is mobile. Other findings No free fluid. IMPRESSION: There is a complex mass within the left adnexa which contains mobile internal debris. A similar appearing mass was  demonstrated on prior CTs and appears to wax and wane in size. Given overall appearance, this may represent a dilated fallopian tube with proteinaceous debris or potentially a complex ovarian mass such as a hemorrhagic cyst. Malignancy cannot be excluded. Given the suggestion of internal septations with color flow, GYN surgical consultation is recommended. At a minimum, follow-up ultrasound in 6- 8 weeks is recommended. Electronically Signed   By: Annia Belt M.D.   On: 05/06/2015 18:55   Ct Abdomen Pelvis W Contrast  05/05/2015  CLINICAL DATA:  Right side abdominal pain, nausea and vomiting beginning this morning. Initial encounter. EXAM: CT ABDOMEN AND PELVIS WITH CONTRAST TECHNIQUE: Multidetector CT imaging of the abdomen and pelvis was performed using the standard protocol following bolus administration of intravenous contrast. CONTRAST:  150 mL OMNIPAQUE IOHEXOL 300 MG/ML  SOLN COMPARISON:  CT abdomen and pelvis 08/23/2014 and 01/07/2015. FINDINGS: The lung bases are clear.  No pleural or pericardial effusion. The liver is somewhat low attenuating consistent with fatty infiltration. The gallbladder has been removed. The adrenal glands, spleen, pancreas and kidneys are unremarkable. As seen on the prior examinations, the patient has a large ventral hernia containing loops of bowel without obstruction or other complicating feature. Small hiatal hernia is noted. The stomach is otherwise unremarkable. The patient is status post appendectomy. Surgical anastomosis is seen in the right lower quadrant. The patient is status post hysterectomy. Cystic lesion in the left side of the pelvis measures 5.3 cm AP by 3.0 cm transverse and is not markedly changed compared to the most recent examination. It has varied in size since studies dated 11/06/2013. No lymphadenopathy or fluid collection is identified. No focal bony abnormality is seen. IMPRESSION: No acute finding abdomen or pelvis. Large ventral hernia contains  loops of bowel without obstruction. Cystic lesion in the left adnexa has  varied in size since 2015 and cannot be definitively characterized. It may represent a dilated fallopian tube remnant or emanate from the ovary. Nonemergent pelvic ultrasound could be used for further evaluation. Small hiatal hernia. Fatty infiltration of the liver. Electronically Signed   By: Drusilla Kannerhomas  Dalessio M.D.   On: 05/05/2015 19:44    EKG:   Orders placed or performed during the hospital encounter of 05/05/15  . ED EKG  . ED EKG  . EKG 12-Lead  . EKG 12-Lead    ASSESSMENT AND PLAN:   1. Abdominal pain with nausea and vomiting: CT scan of the abdomen is essentially normal with possible cystic lesion in the left adnexa. Symptoms started 24 hours ago. Likely a viral gastroenteritis. Continue with symptom control Phenergan, Zofran, morphine as needed. Continue IV hydration. transvaginal ultrasound worrisome for complex ovarian cyst. C/s OBGYN c/s. Continue bowel regimen. In reviewing her notes she does have a history of prior intra-abdominal infections and seroma.  2. Diabetes mellitus type 2: Continue sliding scale insulin. Blood sugars have been well controlled.  3. Hypertension: Elevated possibly due to pain. Continue lisinopril, furosemide, metoprolol. Add 3 times a day hydralazine. Continue as needed hydralazine IV.  Note that this patient has a concerning history of opiate and benzodiazepine abuse. She specifically asks for IV Phenergan given at the same time as her IV Dilaudid. She is intolerant of all pain medications other than Dilaudid. At this time we are giving Phenergan as a suppository due to fairly poor venous access. Using opiates with caution.  All the records are reviewed and case discussed with Care Management/Social Worker. Management plans discussed with the patient, family and they are in agreement.  CODE STATUS: Full  TOTAL TIME TAKING CARE OF THIS PATIENT: 25 minutes.   Greater than 50% of  time spent in care coordination and counseling. POSSIBLE D/C IN 1-2 DAYS, DEPENDING ON CLINICAL CONDITION and OBGYN eval   Deniyah Dillavou M.D on 05/07/2015 at 4:48 PM  Between 7am to 6pm - Pager - [phone]  After 6pm go to [email or web address] - password [email or web address] [email or web address]  [place] [facility] [place] [facility]  Office  [phone]  CC: Primary care physician; Clydie BraunFITZGERALD, DAVID, MD

## 2015-05-07 NOTE — Consult Note (Signed)
Pt with hx of gastritis with recurrent vomiting.  Does not take NSAID.  Had previous EGD times 3, last one 10/2013 by me.  See complete note by Sung Amabileari Richards. Vomiting less.  On PPI and carafate.  Likely gastritis, R/O PUD>

## 2015-05-07 NOTE — Progress Notes (Signed)
Pt has had one episode of nausea but had no vomiting. IV zofran was given and when RN went to reassessed pt's nausea pt was asleep. Will continue to monitor pt.  Karsten RoLauren E Hobbs

## 2015-05-08 DIAGNOSIS — R110 Nausea: Secondary | ICD-10-CM | POA: Diagnosis not present

## 2015-05-08 DIAGNOSIS — R1011 Right upper quadrant pain: Secondary | ICD-10-CM | POA: Diagnosis not present

## 2015-05-08 DIAGNOSIS — R1013 Epigastric pain: Secondary | ICD-10-CM | POA: Diagnosis not present

## 2015-05-08 LAB — GLUCOSE, CAPILLARY
GLUCOSE-CAPILLARY: 138 mg/dL — AB (ref 65–99)
Glucose-Capillary: 137 mg/dL — ABNORMAL HIGH (ref 65–99)
Glucose-Capillary: 141 mg/dL — ABNORMAL HIGH (ref 65–99)
Glucose-Capillary: 112 mg/dL — ABNORMAL HIGH (ref 65–99)

## 2015-05-08 MED ORDER — BUTALBITAL-APAP-CAFFEINE 50-325-40 MG PO TABS
1.0000 | ORAL_TABLET | ORAL | Status: DC | PRN
  Administered 2015-05-08: 1 via ORAL
  Filled 2015-05-08 (×2): qty 1

## 2015-05-08 MED ORDER — NYSTATIN 100000 UNIT/GM EX POWD
Freq: Four times a day (QID) | CUTANEOUS | Status: DC
  Administered 2015-05-08 – 2015-05-09 (×4): via TOPICAL
  Filled 2015-05-08: qty 15

## 2015-05-08 NOTE — Progress Notes (Signed)
Patient A/O, no noted distress. Patient slept well. Tolerated meds well. Held the nitro resulted to no chest pain and bp is managed by the hydralazine. Do not want the BP to drop while sleeping. Noted a rash, redness under the abd and groin folds. Patient was assisted with complete bath and now sitting in chair. Do not noted any other skin issue. Staff will continue to monitor and meet needs. IV right hand unable to flush. RN and supervision was unsuccessful with attempts to initiate another IV. Educated patient on PO pain regimen.

## 2015-05-08 NOTE — Progress Notes (Signed)
A/O.  VSS.  Patient has been sleeping most of the day.  Oxycodone given once for abdominal pain.  Xanax given once for anxiety.  IV was attempted but has been unsuccessful, MD ok with leaving it out. Tolerating her full liquid diet well.  Voiding adequately.  Possible discharge tomorrow.

## 2015-05-08 NOTE — Progress Notes (Signed)
[facility] - [place] [facility] [place]   PATIENT NAME: Daisy [name]    MR#:  [id number]  DATE OF BIRTH:  [birth year]  SUBJECTIVE:  CHIEF COMPLAINT:   Chief Complaint  Patient presents with  . Abdominal Pain  sleeping comfortably but when wakes up says she is in pain.  REVIEW OF SYSTEMS:   Review of Systems  Constitutional: Negative for fever, weight loss, malaise/fatigue and diaphoresis.  HENT: Negative for ear discharge, ear pain, hearing loss, nosebleeds, sore throat and tinnitus.   Eyes: Negative for blurred vision and pain.  Respiratory: Negative for cough, hemoptysis, shortness of breath and wheezing.   Cardiovascular: Negative for chest pain, palpitations, orthopnea and leg swelling.  Gastrointestinal: Positive for nausea and abdominal pain. Negative for heartburn, vomiting, diarrhea, constipation, blood in stool and melena.  Genitourinary: Negative for dysuria, urgency and frequency.  Musculoskeletal: Negative for myalgias and back pain.  Skin: Negative for itching and rash.  Neurological: Negative for dizziness, tingling, tremors, focal weakness, seizures, weakness and headaches.  Psychiatric/Behavioral: Negative for depression. The patient is not nervous/anxious.    DRUG ALLERGIES:   Allergies  Allergen Reactions  . Acetaminophen-Codeine Hives  . Codeine Hives  . Ibuprofen Nausea And Vomiting  . Morphine Nausea And Vomiting  . Propoxyphene Other (See Comments)    Reaction:  GI upset   . Sulfa Antibiotics Hives    VITALS:  Blood pressure 140/69, pulse 80, temperature 98.8 F (37.1 C), temperature source Axillary, resp. rate 18, height  (1.626 m), weight 129.275 kg (285 lb), SpO2 98 %.  PHYSICAL EXAMINATION:  GENERAL:  49 y.o.-year-old patient lying in the bed, very uncomfortable LUNGS: Normal breath sounds bilaterally, no wheezing, rales,rhonchi or crepitation. No use of accessory muscles of respiration.  CARDIOVASCULAR: S1, S2  normal. No murmurs, rubs, or gallops.  ABDOMEN: Soft, tender particularly over the right upper quadrant and mid epigastric area, nondistended. Bowel sounds present. No organomegaly or mass. There is guarding, no rebound EXTREMITIES: No pedal edema, cyanosis, or clubbing.  NEUROLOGIC: Cranial nerves II through XII are intact. Muscle strength 5/5 in all extremities. Sensation intact. Gait not checked.  PSYCHIATRIC: The patient is alert and oriented x 3. Anxious SKIN: No obvious rash, lesion, or ulcer.  LABORATORY PANEL:   CBC  Recent Labs Lab 05/07/15 0711  WBC 12.0*  HGB 11.6*  HCT 36.1  PLT 383   ------------------------------------------------------------------------------------------------------------------  Chemistries   Recent Labs Lab 05/06/15 0140 05/07/15 0711  NA 135 136  K 3.7 3.5  CL 99* 100*  CO2 27 27  GLUCOSE 158* 128*  BUN 7 9  CREATININE 0.64 0.67  CALCIUM 8.7* 8.5*  AST 29  --   ALT 32  --   ALKPHOS 55  --   BILITOT 0.4  --    ------------------------------------------------------------------------------------------------------------------  Cardiac Enzymes  Recent Labs Lab 05/05/15 1446  TROPONINI <0.03   ------------------------------------------------------------------------------------------------------------------  RADIOLOGY:  US Transvaginal Non-ob  05/06/2015  CLINICAL DATA:  Patient with abnormal left adnexal mass. EXAM: TRANSABDOMINAL AND TRANSVAGINAL ULTRASOUND OF PELVIS TECHNIQUE: Both transabdominal and transvaginal ultrasound examinations of the pelvis were performed. Transabdominal technique was performed for global imaging of the pelvis including uterus, ovaries, adnexal regions, and pelvic cul-de-sac. It was necessary to proceed with endovaginal exam following the transabdominal exam to visualize the adnexal structures. COMPARISON:  CT abdomen pelvis 09/28/2011; 06/13/2012; 05/05/2015. FINDINGS: Uterus Surgically absent. Right ovary  Surgically absent. Left ovary No definite ovarian tissue is visualized. Within the left adnexa there  is a 5.2 x 3.2 x 4.2 cm complex cystic mass with internal debris and thin internal septations with color Doppler flow. The internal debris is mobile. Other findings No free fluid. IMPRESSION: There is a complex mass within the left adnexa which contains mobile internal debris. A similar appearing mass was demonstrated on prior CTs and appears to wax and wane in size. Given overall appearance, this may represent a dilated fallopian tube with proteinaceous debris or potentially a complex ovarian mass such as a hemorrhagic cyst. Malignancy cannot be excluded. Given the suggestion of internal septations with color flow, GYN surgical consultation is recommended. At a minimum, follow-up ultrasound in 6- 8 weeks is recommended. Electronically Signed   By: Annia Belt M.D.   On: 05/06/2015 18:55   US Pelvis Complete  05/06/2015  CLINICAL DATA:  Patient with abnormal left adnexal mass. EXAM: TRANSABDOMINAL AND TRANSVAGINAL ULTRASOUND OF PELVIS TECHNIQUE: Both transabdominal and transvaginal ultrasound examinations of the pelvis were performed. Transabdominal technique was performed for global imaging of the pelvis including uterus, ovaries, adnexal regions, and pelvic cul-de-sac. It was necessary to proceed with endovaginal exam following the transabdominal exam to visualize the adnexal structures. COMPARISON:  CT abdomen pelvis 09/28/2011; 06/13/2012; 05/05/2015. FINDINGS: Uterus Surgically absent. Right ovary Surgically absent. Left ovary No definite ovarian tissue is visualized. Within the left adnexa there is a 5.2 x 3.2 x 4.2 cm complex cystic mass with internal debris and thin internal septations with color Doppler flow. The internal debris is mobile. Other findings No free fluid. IMPRESSION: There is a complex mass within the left adnexa which contains mobile internal debris. A similar appearing mass was  demonstrated on prior CTs and appears to wax and wane in size. Given overall appearance, this may represent a dilated fallopian tube with proteinaceous debris or potentially a complex ovarian mass such as a hemorrhagic cyst. Malignancy cannot be excluded. Given the suggestion of internal septations with color flow, GYN surgical consultation is recommended. At a minimum, follow-up ultrasound in 6- 8 weeks is recommended. Electronically Signed   By: Annia Belt M.D.   On: 05/06/2015 18:55    EKG:   Orders placed or performed during the hospital encounter of 05/05/15  . ED EKG  . ED EKG  . EKG 12-Lead  . EKG 12-Lead    ASSESSMENT AND PLAN:   1. Abdominal pain with nausea and vomiting: CT scan of the abdomen is essentially normal with possible cystic lesion in the left adnexa. Symptoms started 24 hours ago. Likely a viral gastroenteritis. Continue with symptom control Phenergan, Zofran, morphine as needed. Continue IV hydration. transvaginal ultrasound worrisome for complex ovarian cyst. Pending c/s to OBGYN. Continue bowel regimen. In reviewing her notes she does have a history of prior intra-abdominal infections and seroma.  2. Diabetes mellitus type 2: Continue sliding scale insulin. Blood sugars have been well controlled.  3. Hypertension: Elevated possibly due to pain. Continue lisinopril, furosemide, metoprolol. Add 3 times a day hydralazine.   Note that this patient has a concerning history of opiate and benzodiazepine abuse. She specifically asks for IV Phenergan given at the same time as her IV Dilaudid. She is intolerant of all pain medications other than Dilaudid. At this time we can use Phenergan as a suppository due to no venous access. Using opiates with caution.  All the records are reviewed and case discussed with Care Management/Social Worker. Management plans discussed with the patient, family and they are in agreement.  CODE STATUS: Full  TOTAL  TIME TAKING CARE OF THIS  PATIENT: 25 minutes.   Greater than 50% of time spent in care coordination and counseling. POSSIBLE D/C IN AM, DEPENDING ON CLINICAL CONDITION and OBGYN eval   Gerrell Tabet M.D on 05/08/2015 at 12:59 PM  Between 7am to 6pm - Pager - [phone]  After 6pm go to [email or web address] - password [email or web address] [facility]  [facility]  Office  [phone]  CC: Primary care physician; Clydie BraunFITZGERALD, DAVID, MD

## 2015-05-08 NOTE — Consult Note (Signed)
Subjective: Patient seen for recurrent epigastric abdominal pain nausea and vomiting. Patient is somnolent today and had to be awakened several times. She had been given oxycodone and Xanax. She states that her nausea and abdominal pain is improving. She still has some nausea. No emesis or bowel movement recorded since admission.  Objective: Vital signs in last 24 hours: Temp:  [98.3 F (36.8 C)-98.8 F (37.1 C)] 98.8 F (37.1 C) (11/24 1239) Pulse Rate:  [80-94] 80 (11/24 1239) Resp:  [16-18] 18 (11/24 1239) BP: (117-162)/(60-95) 140/69 mmHg (11/24 1239) SpO2:  [95 %-100 %] 98 % (11/24 1239) Blood pressure 140/69, pulse 80, temperature 98.8 F (37.1 C), temperature source Axillary, resp. rate 18, height 5\' 4"  (1.626 m), weight 129.275 kg (285 lb), SpO2 98 %.   Intake/Output from previous day: 11/23 0701 - 11/24 0700 In: 120 [P.O.:120] Out: -   Intake/Output this shift: Total I/O In: 340 [P.O.:340] Out: 900 [Urine:900]   General appearance:  A 49 year old female, morbidly obese, somnolent/sedated, no distress upon awakening Resp:  Clear to auscultation Cardio:  Regular rate and rhythm GI:  Soft mild discomfort in the epigastric region bowel sounds are positive and distant no rebound. Not possible to palpate internal organs. Extremities:     Lab Results: Results for orders placed or performed during the hospital encounter of 05/05/15 (from the past 24 hour(s))  Glucose, capillary     Status: Abnormal   Collection Time: 05/07/15  9:36 PM  Result Value Ref Range   Glucose-Capillary 125 (H) 65 - 99 mg/dL  Glucose, capillary     Status: Abnormal   Collection Time: 05/08/15  7:30 AM  Result Value Ref Range   Glucose-Capillary 137 (H) 65 - 99 mg/dL   Comment 1 Notify RN   Glucose, capillary     Status: Abnormal   Collection Time: 05/08/15 11:49 AM  Result Value Ref Range   Glucose-Capillary 141 (H) 65 - 99 mg/dL   Comment 1 Notify RN   Glucose, capillary     Status:  Abnormal   Collection Time: 05/08/15  4:33 PM  Result Value Ref Range   Glucose-Capillary 138 (H) 65 - 99 mg/dL   Comment 1 Notify RN       Recent Labs  05/06/15 0140 05/07/15 0711  WBC 12.1* 12.0*  HGB 12.8 11.6*  HCT 40.6 36.1  PLT 366 383   BMET  Recent Labs  05/06/15 0140 05/07/15 0711  NA 135 136  K 3.7 3.5  CL 99* 100*  CO2 27 27  GLUCOSE 158* 128*  BUN 7 9  CREATININE 0.64 0.67  CALCIUM 8.7* 8.5*   LFT  Recent Labs  05/06/15 0140  PROT 7.8  ALBUMIN 3.6  AST 29  ALT 32  ALKPHOS 55  BILITOT 0.4   PT/INR No results for input(s): LABPROT, INR in the last 72 hours. Hepatitis Panel No results for input(s): HEPBSAG, HCVAB, HEPAIGM, HEPBIGM in the last 72 hours. C-Diff No results for input(s): CDIFFTOX in the last 72 hours. No results for input(s): CDIFFPCR in the last 72 hours.   Studies/Results: No results found.  Scheduled Inpatient Medications:   . docusate sodium  100 mg Oral BID  . enoxaparin (LOVENOX) injection  40 mg Subcutaneous Q24H  . FLUoxetine  20 mg Oral Daily  . furosemide  20 mg Oral Daily  . hydrALAZINE  50 mg Oral 3 times per day  . insulin aspart  0-5 Units Subcutaneous QHS  . insulin aspart  0-9 Units  Subcutaneous TID WC  . lamoTRIgine  100 mg Oral Daily  . lisinopril  20 mg Oral Daily  . LORazepam  2 mg Intravenous Once  . metoprolol succinate  50 mg Oral Daily  . nystatin   Topical 4 times per day  . pantoprazole  40 mg Oral BID  . sucralfate  1 g Oral TID WC & HS  . traZODone  100 mg Oral QHS    Continuous Inpatient Infusions:     PRN Inpatient Medications:  acetaminophen **OR** acetaminophen, alprazolam, butalbital-acetaminophen-caffeine, diphenhydrAMINE, hydrALAZINE, HYDROmorphone (DILAUDID) injection, nortriptyline, ondansetron **OR** ondansetron (ZOFRAN) IV, oxyCODONE, polyethylene glycol, promethazine, promethazine, tiZANidine, zolpidem  Miscellaneous:   Assessment:  1. Recurrent issues with gastritis and a  current vomiting. It is of note the patient has been diabetic for a number of years and she states that her A1c's as an outpatient have been in the 9 range. She has had multiple EGDs for the past several years. Differential diagnosis would include under the above circumstance gastroparesis  as well. This is likely exacerbated by her multiple outpatient medications which would increased problems with gastric motility such as oxycodone ties and iodine trazodone and alprazolam. 2. Left adnexal cystic lesion noted, GYN consulted on chart.  Plan:  1. Continue PPI twice a day as you are and Carafate solution. 2. Recommend gastric emptying study, will request for tomorrow.  Christena Deem MD 05/08/2015, 5:37 PM

## 2015-05-08 NOTE — Consult Note (Signed)
Reason for Consult:Complex Left Adnexal Cyst, Intractable pain Referring Physician: Dr. Max Sane  Daisy [name] is an 49 y.o. female who was admitted for intractable nausea and vomiting and RUQ abdominal pain.  Patient with h/o multiple comorbidities as well as abdominal surgeries and intra-abdominal infection. Consult placed for finding of stable complex adnexal mass on left, noted on recent CT scan.  Of note, patient is s/p TAH/RSO in 2002.  Patient somnolent, and could only finish 1-2 sentences at a time before having to be awakened again.  Denies any further episodes of vomiting, however still notes nausea RUQ pain that radiates to RLQ (although improved since admission).  Pertinent Gynecological History: Menses: None, patient s/p hysterectomy Bleeding: None Contraception: status post hysterectomy Sexually transmitted diseases: no past history Previous GYN Procedures: TAH/RSO  Last mammogram: normal Date: patient cannot recall date Last pap: normal Date: 2002 OB History: G5, P2  (SAB x 3)  Menstrual History: Menarche age: 69  No LMP recorded. Patient has had a hysterectomy.    Past Medical History  Diagnosis Date  . Diabetes mellitus without complication ([place])   . Depression   . Hypertension   . Anxiety   . GERD (gastroesophageal reflux disease)   . Migraine   . Obesity     Past Surgical History  Procedure Laterality Date  . Hernia repair    . Appendectomy    . Cholecystectomy    . Knee surgery    . Abdominal hysterectomy      Family History  Problem Relation Age of Onset  . Arthritis Mother   . Depression Mother   . Diabetes Mother   . Heart disease Mother   . Hyperlipidemia Mother   . Hypertension Mother   . Stroke Mother   . Arthritis Father   . Diabetes Father   . Hearing loss Father   . Heart disease Father   . Hyperlipidemia Father   . Hypertension Father     Social History   Social History  . Marital Status: Single    Spouse Name: N/A  .  Number of Children: N/A  . Years of Education: N/A   Occupational History  . Not on file.   Social History Main Topics  . Smoking status: Never Smoker   . Smokeless tobacco: Not on file  . Alcohol Use: 0.0 oz/week    0 Standard drinks or equivalent per week     Comment: occasional  . Drug Use: No  . Sexual Activity: Yes   Other Topics Concern  . Not on file   Social History Narrative    Allergies:  Allergies  Allergen Reactions  . Acetaminophen-Codeine Hives  . Codeine Hives  . Ibuprofen Nausea And Vomiting  . Morphine Nausea And Vomiting  . Propoxyphene Other (See Comments)    Reaction:  GI upset   . Sulfa Antibiotics Hives    Medications:  Scheduled: . docusate sodium  100 mg Oral BID  . enoxaparin (LOVENOX) injection  40 mg Subcutaneous Q24H  . FLUoxetine  20 mg Oral Daily  . furosemide  20 mg Oral Daily  . hydrALAZINE  50 mg Oral 3 times per day  . insulin aspart  0-5 Units Subcutaneous QHS  . insulin aspart  0-9 Units Subcutaneous TID WC  . lamoTRIgine  100 mg Oral Daily  . lisinopril  20 mg Oral Daily  . LORazepam  2 mg Intravenous Once  . metoprolol succinate  50 mg Oral Daily  . nystatin  Topical 4 times per day  . pantoprazole  40 mg Oral BID  . sucralfate  1 g Oral TID WC & HS  . traZODone  100 mg Oral QHS   Continuous Infusions:    PRN Meds:.acetaminophen **OR** acetaminophen, alprazolam, butalbital-acetaminophen-caffeine, diphenhydrAMINE, hydrALAZINE, HYDROmorphone (DILAUDID) injection, nortriptyline, ondansetron **OR** ondansetron (ZOFRAN) IV, oxyCODONE, polyethylene glycol, promethazine, promethazine, tiZANidine, zolpidem   Review of Systems  Constitutional: Negative for fever, chills and weight loss.  HENT: Negative for congestion and sore throat.   Eyes: Negative for blurred vision, double vision and discharge.  Respiratory: Negative for cough and hemoptysis.   Cardiovascular: Negative for chest pain, palpitations and leg swelling.   Gastrointestinal: Positive for nausea, vomiting and abdominal pain. Negative for diarrhea, constipation and blood in stool.  Genitourinary: Negative for dysuria, urgency, frequency and hematuria.  Musculoskeletal: Negative for myalgias, back pain and neck pain.  Skin: Negative for itching and rash.  Neurological: Negative for dizziness, tremors, seizures and headaches.  Endo/Heme/Allergies: Does not bruise/bleed easily.  Psychiatric/Behavioral: Negative for depression and suicidal ideas. The patient is not nervous/anxious.     Blood pressure 140/69, pulse 80, temperature 98.8 F (37.1 C), temperature source Axillary, resp. rate 18, height _0  (1.626 m), weight 285 lb (129.275 kg), SpO2 98 %.   Physical Exam  Constitutional: She appears well-developed and well-nourished. No distress.  obese  HENT:  Head: Normocephalic and atraumatic.  Eyes: EOM are normal. Right eye exhibits no discharge. Left eye exhibits no discharge. No scleral icterus.  Neck: Normal range of motion. Neck supple. No tracheal deviation present. No thyromegaly present.  Cardiovascular: Normal rate, regular rhythm and normal heart sounds.  Exam reveals no gallop and no friction rub.   No murmur heard. Respiratory: Effort normal and breath sounds normal. No respiratory distress. She has no wheezes. She has no rales.  GI: Soft. Bowel sounds are normal. She exhibits no distension and no mass. There is tenderness. There is no rebound and no guarding.  RUQ and RLQ  Genitourinary:  Deferred at patient's request  Musculoskeletal: Normal range of motion. She exhibits no edema or tenderness.  Neurological:  Somnolent, oriented to place and person  Skin: Skin is warm and dry. No rash noted. No erythema. No pallor.    Labs:  Results for orders placed or performed during the hospital encounter of 05/05/15 (from the past 48 hour(s))  Glucose, capillary     Status: Abnormal   Collection Time: 05/06/15  5:31 PM  Result Value  Ref Range   Glucose-Capillary 134 (H) 65 - 99 mg/dL   Comment 1 Notify RN   Glucose, capillary     Status: Abnormal   Collection Time: 05/06/15  9:18 PM  Result Value Ref Range   Glucose-Capillary 185 (H) 65 - 99 mg/dL  CBC     Status: Abnormal   Collection Time: 05/07/15  7:11 AM  Result Value Ref Range   WBC 12.0 (H) 3.6 - 11.0 K/uL   RBC 4.27 3.80 - 5.20 MIL/uL   Hemoglobin 11.6 (L) 12.0 - 16.0 g/dL   HCT 36.1 35.0 - 47.0 %   MCV 84.7 80.0 - 100.0 fL   MCH 27.1 26.0 - 34.0 pg   MCHC 32.0 32.0 - 36.0 g/dL   RDW 16.9 (H) 11.5 - 14.5 %   Platelets 383 150 - 440 K/uL  Basic metabolic panel     Status: Abnormal   Collection Time: 05/07/15  7:11 AM  Result Value Ref Range   Sodium  136 135 - 145 mmol/L   Potassium 3.5 3.5 - 5.1 mmol/L    Comment: HEMOLYSIS AT THIS LEVEL MAY AFFECT RESULT   Chloride 100 (L) 101 - 111 mmol/L   CO2 27 22 - 32 mmol/L   Glucose, Bld 128 (H) 65 - 99 mg/dL   BUN 9 6 - 20 mg/dL   Creatinine, Ser 0.67 0.44 - 1.00 mg/dL   Calcium 8.5 (L) 8.9 - 10.3 mg/dL   GFR calc non Af Amer >60 >60 mL/min   GFR calc Af Amer >60 >60 mL/min    Comment: (NOTE) The eGFR has been calculated using the CKD EPI equation. This calculation has not been validated in all clinical situations. eGFR's persistently <60 mL/min signify possible Chronic Kidney Disease.    Anion gap 9 5 - 15  Glucose, capillary     Status: Abnormal   Collection Time: 05/07/15  7:45 AM  Result Value Ref Range   Glucose-Capillary 114 (H) 65 - 99 mg/dL   Comment 1 Notify RN   Glucose, capillary     Status: Abnormal   Collection Time: 05/07/15 11:53 AM  Result Value Ref Range   Glucose-Capillary 127 (H) 65 - 99 mg/dL   Comment 1 Notify RN   Glucose, capillary     Status: Abnormal   Collection Time: 05/07/15  4:58 PM  Result Value Ref Range   Glucose-Capillary 131 (H) 65 - 99 mg/dL   Comment 1 Notify RN   Glucose, capillary     Status: Abnormal   Collection Time: 05/07/15  9:36 PM  Result Value  Ref Range   Glucose-Capillary 125 (H) 65 - 99 mg/dL  Glucose, capillary     Status: Abnormal   Collection Time: 05/08/15  7:30 AM  Result Value Ref Range   Glucose-Capillary 137 (H) 65 - 99 mg/dL   Comment 1 Notify RN   Glucose, capillary     Status: Abnormal   Collection Time: 05/08/15 11:49 AM  Result Value Ref Range   Glucose-Capillary 141 (H) 65 - 99 mg/dL   Comment 1 Notify RN     US Transvaginal Non-ob  05/06/2015  CLINICAL DATA:  Patient with abnormal left adnexal mass. EXAM: TRANSABDOMINAL AND TRANSVAGINAL ULTRASOUND OF PELVIS TECHNIQUE: Both transabdominal and transvaginal ultrasound examinations of the pelvis were performed. Transabdominal technique was performed for global imaging of the pelvis including uterus, ovaries, adnexal regions, and pelvic cul-de-sac. It was necessary to proceed with endovaginal exam following the transabdominal exam to visualize the adnexal structures. COMPARISON:  CT abdomen pelvis 09/28/2011; 06/13/2012; 05/05/2015. FINDINGS: Uterus Surgically absent. Right ovary Surgically absent. Left ovary No definite ovarian tissue is visualized. Within the left adnexa there is a 5.2 x 3.2 x 4.2 cm complex cystic mass with internal debris and thin internal septations with color Doppler flow. The internal debris is mobile. Other findings No free fluid. IMPRESSION: There is a complex mass within the left adnexa which contains mobile internal debris. A similar appearing mass was demonstrated on prior CTs and appears to wax and wane in size. Given overall appearance, this may represent a dilated fallopian tube with proteinaceous debris or potentially a complex ovarian mass such as a hemorrhagic cyst. Malignancy cannot be excluded. Given the suggestion of internal septations with color flow, GYN surgical consultation is recommended. At a minimum, follow-up ultrasound in 6- 8 weeks is recommended. Electronically Signed   By: Lovey Newcomer M.D.   On: 05/06/2015 18:55   US Pelvis  Complete  05/06/2015  CLINICAL DATA:  Patient with abnormal left adnexal mass. EXAM: TRANSABDOMINAL AND TRANSVAGINAL ULTRASOUND OF PELVIS TECHNIQUE: Both transabdominal and transvaginal ultrasound examinations of the pelvis were performed. Transabdominal technique was performed for global imaging of the pelvis including uterus, ovaries, adnexal regions, and pelvic cul-de-sac. It was necessary to proceed with endovaginal exam following the transabdominal exam to visualize the adnexal structures. COMPARISON:  CT abdomen pelvis 09/28/2011; 06/13/2012; 05/05/2015. FINDINGS: Uterus Surgically absent. Right ovary Surgically absent. Left ovary No definite ovarian tissue is visualized. Within the left adnexa there is a 5.2 x 3.2 x 4.2 cm complex cystic mass with internal debris and thin internal septations with color Doppler flow. The internal debris is mobile. Other findings No free fluid. IMPRESSION: There is a complex mass within the left adnexa which contains mobile internal debris. A similar appearing mass was demonstrated on prior CTs and appears to wax and wane in size. Given overall appearance, this may represent a dilated fallopian tube with proteinaceous debris or potentially a complex ovarian mass such as a hemorrhagic cyst. Malignancy cannot be excluded. Given the suggestion of internal septations with color flow, GYN surgical consultation is recommended. At a minimum, follow-up ultrasound in 6- 8 weeks is recommended. Electronically Signed   By: Lovey Newcomer M.D.   On: 05/06/2015 18:55    Assessment: 1) Intractable nausea/vomiting,  2) stable Complex adnexal Cyst 3) Multiple comorbidities and abdominal surgeries  Plan:  1) Nausea and vomiting improving, currently receiving Phenergan.  Uncertain if related to cyst as no evidence of torsion and patient has had persistent cyst (fairly stable in size) for several years (as noted in [facility] and [facility] records) 2) Complex adnexal cyst (waxing  and waning in size over several years), dilated fallopian tube vs hemorrhagic cyst seen again on imaging.  Patient was previously referred to [facility] (11/2014) for further management and possible surgery as patient is high risk surgical candidate.  At that time, due to benign nature of mass (normal CA 125) and risk status, did not recommend surgery but referral to pain management, who could further manage patient and  if deemed necessary, could perform sugery.  Patient notes that she was supposed to be receiving surgery, however cannot recall a set date for surgery.  Due to comorbidities and prior abdominal surgical history, would not recommend surgical intervention at this time, and would continue with current conservative management of symptoms (transition to PO pain meds once tolerating PO intake).  Once discharged, patient can re-establish with [facility] if surgical intervention was indeed to be scheduled.    Thank you for the consult.  No further inpatient follow up recommended from GYN at this time.   Please do not hesitate to contact if you have further questions or concerns.     Rubie Maid, MD [facility] Pager: [phone]

## 2015-05-09 ENCOUNTER — Observation Stay: Payer: Medicare Other

## 2015-05-09 ENCOUNTER — Encounter: Payer: Self-pay | Admitting: Radiology

## 2015-05-09 DIAGNOSIS — R1013 Epigastric pain: Secondary | ICD-10-CM | POA: Diagnosis not present

## 2015-05-09 LAB — BASIC METABOLIC PANEL
ANION GAP: 6 (ref 5–15)
BUN: 9 mg/dL (ref 6–20)
CALCIUM: 8.6 mg/dL — AB (ref 8.9–10.3)
CHLORIDE: 100 mmol/L — AB (ref 101–111)
CO2: 30 mmol/L (ref 22–32)
Creatinine, Ser: 0.77 mg/dL (ref 0.44–1.00)
GFR calc non Af Amer: >60 mL/min (ref >60)
Glucose, Bld: 109 mg/dL — ABNORMAL HIGH (ref 65–99)
Potassium: 2.7 mmol/L — CL (ref 3.5–5.1)
Sodium: 136 mmol/L (ref 135–145)

## 2015-05-09 LAB — CBC
HEMATOCRIT: 37.3 % (ref 35.0–47.0)
HEMOGLOBIN: 12 g/dL (ref 12.0–16.0)
MCH: 27.1 pg (ref 26.0–34.0)
MCHC: 32.2 g/dL (ref 32.0–36.0)
MCV: 84.1 fL (ref 80.0–100.0)
Platelets: 404 10*3/uL (ref 150–440)
RBC: 4.43 MIL/uL (ref 3.80–5.20)
RDW: 16.3 % — ABNORMAL HIGH (ref 11.5–14.5)
WBC: 7.8 10*3/uL (ref 3.6–11.0)

## 2015-05-09 LAB — GLUCOSE, CAPILLARY: GLUCOSE-CAPILLARY: 131 mg/dL — AB (ref 65–99)

## 2015-05-09 MED ORDER — PROMETHAZINE HCL 12.5 MG PO TABS: 12.5000 mg | ORAL_TABLET | Freq: Four times a day (QID) | ORAL | Status: DC | PRN

## 2015-05-09 MED ORDER — POLYETHYLENE GLYCOL 3350 17 G PO PACK: 17.0000 g | PACK | Freq: Every day | ORAL | Status: DC | PRN

## 2015-05-09 MED ORDER — POTASSIUM CHLORIDE CRYS ER 20 MEQ PO TBCR
40.0000 meq | EXTENDED_RELEASE_TABLET | Freq: Once | ORAL | Status: AC
  Administered 2015-05-09: 40 meq via ORAL
  Filled 2015-05-09: qty 2

## 2015-05-09 MED ORDER — POTASSIUM CHLORIDE CRYS ER 20 MEQ PO TBCR: 40.0000 meq | EXTENDED_RELEASE_TABLET | Freq: Once | ORAL | Status: DC

## 2015-05-09 MED ORDER — TECHNETIUM TC 99M SULFUR COLLOID FILTERED
2.0930 | Freq: Once | INTRAVENOUS | Status: AC | PRN
  Administered 2015-05-09: 2.093 via INTRADERMAL

## 2015-05-09 MED ORDER — METOCLOPRAMIDE HCL 5 MG PO TABS: 5.0000 mg | ORAL_TABLET | Freq: Three times a day (TID) | ORAL | Status: DC | PRN

## 2015-05-09 MED ORDER — SUCRALFATE 1 G PO TABS: 1.0000 g | ORAL_TABLET | Freq: Four times a day (QID) | ORAL | Status: DC

## 2015-05-09 MED ORDER — ENOXAPARIN SODIUM 40 MG/0.4ML ~~LOC~~ SOLN: 40.0000 mg | Freq: Two times a day (BID) | SUBCUTANEOUS | Status: DC

## 2015-05-09 NOTE — Discharge Summary (Signed)
[facility] - [place] at [facility]   PATIENT NAME: Daisy [name]    MR#:  [id number]  DATE OF BIRTH:  1966-03-10  DATE OF ADMISSION:  05/05/2015 ADMITTING PHYSICIAN: Wyatt Haste, MD  DATE OF DISCHARGE: 05/09/2015  PRIMARY CARE PHYSICIAN: Clydie Braun, MD    ADMISSION DIAGNOSIS:  Abdominal pain, unspecified abdominal location [R10.9]  DISCHARGE DIAGNOSIS:  Principal Problem:   Intractable abdominal pain gastroparesis Ovarian cyst SECONDARY DIAGNOSIS:   Past Medical History  Diagnosis Date  . Diabetes mellitus without complication ([facility])   . Depression   . Hypertension   . Anxiety   . GERD (gastroesophageal reflux disease)   . Migraine   . Obesity     HOSPITAL COURSE:  49 y.o. female with a known history of abdominal pain, ventral hernia, type 2 diabetes non-insulin-requiring was presented with abdominal pain.  Please see Dr. Jarrett Soho dictated history and physical for further details.  GI consultation was obtained who recommended conservative medical management as this was Thought to be due to gastroparesis and or possible component of gastritis.  Also underwent CT scan of the abdomen which was essentially negative.  Did show some ovarian cysts for which she underwent transvaginal ultrasound, which were thought to be stable and benign with no evidence of torsion.  OB/GYN consultation was obtained who recommended outpatient follow up at [facility] where she was only scheduled for surgery regarding same.  Per Obgyn here: Complex adnexal cyst (waxing and waning in size over several years), dilated fallopian tube vs hemorrhagic cyst seen again on imaging. Patient was previously referred to [facility] (11/2014) for further management and possible surgery as patient is high risk surgical candidate. At that time, due to benign nature of mass (normal CA 125) and risk status, did not recommend surgery but referral to pain management, who could  further manage patient and if deemed necessary, could perform sugery. Patient notes that she was supposed to be receiving surgery, however cannot recall a set date for surgery. Due to comorbidities and prior abdominal surgical history, would not recommend surgical intervention at this time, and would continue with current conservative management of symptoms (transition to PO pain meds once tolerating PO intake). Once discharged, patient can re-establish with [facility] if surgical intervention was indeed to be scheduled.   She is agreeable with the discharge plan and is being discharged home in stable condition DISCHARGE CONDITIONS:   stable  CONSULTS OBTAINED:  Treatment Team:  Wyatt Haste, MD Suzy Bouchard, MD Hildred Laser, MD  DRUG ALLERGIES:   Allergies  Allergen Reactions  . Acetaminophen-Codeine Hives  . Codeine Hives  . Ibuprofen Nausea And Vomiting  . Morphine Nausea And Vomiting  . Propoxyphene Other (See Comments)    Reaction:  GI upset   . Sulfa Antibiotics Hives    DISCHARGE MEDICATIONS:   Current Discharge Medication List    START taking these medications   Details  metoCLOPramide (REGLAN) 5 MG tablet Take 1 tablet (5 mg total) by mouth every 8 (eight) hours as needed for nausea, vomiting or refractory nausea / vomiting. Qty: 15 tablet, Refills: 0    polyethylene glycol (MIRALAX / GLYCOLAX) packet Take 17 g by mouth daily as needed for mild constipation. Qty: 14 each, Refills: 0    promethazine (PHENERGAN) 12.5 MG tablet Take 1 tablet (12.5 mg total) by mouth every 6 (six) hours as needed for nausea or vomiting. Qty: 30 tablet, Refills: 0    sucralfate (CARAFATE) 1  G tablet Take 1 tablet (1 g total) by mouth 4 (four) times daily. Qty: 30 tablet, Refills: 0      CONTINUE these medications which have NOT CHANGED   Details  alprazolam (XANAX) 2 MG tablet Take 2 mg by mouth 3 (three) times daily as needed for anxiety.     FLUoxetine (PROZAC) 20 MG  capsule Take 20 mg by mouth daily.    furosemide (LASIX) 20 MG tablet Take 20 mg by mouth daily.     lamoTRIgine (LAMICTAL) 100 MG tablet Take 100 mg by mouth daily.    lisinopril (PRINIVIL,ZESTRIL) 20 MG tablet Take 20 mg by mouth daily.    metFORMIN (GLUCOPHAGE) 500 MG tablet Take 500 mg by mouth daily.    metoprolol succinate (TOPROL-XL) 25 MG 24 hr tablet Take 50 mg by mouth daily.    nortriptyline (PAMELOR) 25 MG capsule Take 25 mg by mouth 2 (two) times daily as needed (for anxiety).     !! Oxycodone HCl 10 MG TABS Take 5-10 mg by mouth 4 (four) times daily as needed (for pain).    pantoprazole (PROTONIX) 40 MG tablet Take 40 mg by mouth 2 (two) times daily.     tiZANidine (ZANAFLEX) 4 MG tablet Take 4 mg by mouth 4 (four) times daily as needed for muscle spasms.    traZODone (DESYREL) 100 MG tablet Take 100 mg by mouth at bedtime.    zolpidem (AMBIEN CR) 12.5 MG CR tablet Take 12.5 mg by mouth at bedtime as needed for sleep.    ondansetron (ZOFRAN) 4 MG tablet Take 1 tablet (4 mg total) by mouth every 6 (six) hours as needed for nausea. Qty: 20 tablet, Refills: 0    !! oxyCODONE (OXY IR/ROXICODONE) 5 MG immediate release tablet Take 1 tablet (5 mg total) by mouth every 4 (four) hours as needed for moderate pain. Qty: 16 tablet, Refills: 0     !! - Potential duplicate medications found. Please discuss with provider.       DISCHARGE INSTRUCTIONS:   DIET:  Regular diet  DISCHARGE CONDITION:  Good  ACTIVITY:  Activity as tolerated  OXYGEN:  Home Oxygen: No.   Oxygen Delivery: room air  DISCHARGE LOCATION:  home   If you experience worsening of your admission symptoms, develop shortness of breath, life threatening emergency, suicidal or homicidal thoughts you must seek medical attention immediately by calling 911 or calling your MD immediately  if symptoms less severe.  You Must read complete instructions/literature along with all the possible adverse  reactions/side effects for all the Medicines you take and that have been prescribed to you. Take any new Medicines after you have completely understood and accpet all the possible adverse reactions/side effects.   Please note  You were cared for by a hospitalist during your hospital stay. If you have any questions about your discharge medications or the care you received while you were in the hospital after you are discharged, you can call the unit and asked to speak with the hospitalist on call if the hospitalist that took care of you is not available. Once you are discharged, your primary care physician will handle any further medical issues. Please note that NO REFILLS for any discharge medications will be authorized once you are discharged, as it is imperative that you return to your primary care physician (or establish a relationship with a primary care physician if you do not have one) for your aftercare needs so that they can reassess your need  for medications and monitor your lab values.    On the day of Discharge:   VITAL SIGNS:  Blood pressure 130/81, pulse 79, temperature 98.3 F (36.8 C), temperature source Oral, resp. rate 19, height  (1.626 m), weight 129.275 kg (285 lb), SpO2 100 %. PHYSICAL EXAMINATION:  GENERAL:  49 y.o.-year-old patient lying in the bed with no acute distress.  EYES: Pupils equal, round, reactive to light and accommodation. No scleral icterus. Extraocular muscles intact.  HEENT: Head atraumatic, normocephalic. Oropharynx and nasopharynx clear.  NECK:  Supple, no jugular venous distention. No thyroid enlargement, no tenderness.  LUNGS: Normal breath sounds bilaterally, no wheezing, rales,rhonchi or crepitation. No use of accessory muscles of respiration.  CARDIOVASCULAR: S1, S2 normal. No murmurs, rubs, or gallops.  ABDOMEN: Soft, non-tender, non-distended. Bowel sounds present. No organomegaly or mass.  EXTREMITIES: No pedal edema, cyanosis, or clubbing.   NEUROLOGIC: Cranial nerves II through XII are intact. Muscle strength 5/5 in all extremities. Sensation intact. Gait not checked.  PSYCHIATRIC: The patient is alert and oriented x 3.  SKIN: No obvious rash, lesion, or ulcer.  DATA REVIEW:   CBC  Recent Labs Lab 05/09/15 0537  WBC 7.8  HGB 12.0  HCT 37.3  PLT 404    Chemistries   Recent Labs Lab 05/06/15 0140  05/09/15 0537  NA 135  < > 136  K 3.7  < > 2.7*  CL 99*  < > 100*  CO2 27  < > 30  GLUCOSE 158*  < > 109*  BUN 7  < > 9  CREATININE 0.64  < > 0.77  CALCIUM 8.7*  < > 8.6*  AST 29  --   --   ALT 32  --   --   ALKPHOS 55  --   --   BILITOT 0.4  --   --   < > = values in this interval not displayed.  Cardiac Enzymes  Recent Labs Lab 05/05/15 1446  TROPONINI <0.03    Microbiology Results  Results for orders placed or performed in visit on 12/11/13  Culture, blood (single)     Status: None   Collection Time: 12/10/13  7:46 AM  Result Value Ref Range Status   Micro Text Report   Final       SOURCE: first set right hand    COMMENT                   NO GROWTH AEROBICALLY/ANAEROBICALLY IN 5 DAYS   ANTIBIOTIC                                                      Culture, blood (single)     Status: None   Collection Time: 12/10/13  8:00 AM  Result Value Ref Range Status   Micro Text Report   Final       SOURCE: second set left hand1    COMMENT                   NO GROWTH AEROBICALLY/ANAEROBICALLY IN 5 DAYS   ANTIBIOTIC  RADIOLOGY:  No results found.   Management plans discussed with the patient, family and they are in agreement.  CODE STATUS:     Code Status Orders        Start     Ordered   05/05/15 2036  Full code   Continuous     05/05/15 2036    She is at very high risk for readmission, strong suscipision for narcotic abuse  TOTAL TIME TAKING CARE OF THIS PATIENT: 55 minutes.    [facility], Imaya Duffy M.D on 05/09/2015 at 4:02  PM  Between 7am to 6pm - Pager - [phone]  After 6pm go to [email or web address] - password [email or web address]  [place] [facility]  Office  [phone]  CC: Primary care physician; Clydie Braun, MD

## 2015-05-09 NOTE — Care Management Important Message (Signed)
Important Message  Patient Details  Name: Daisy [name] MRN: [id number] Date of Birth: [birth year]   Medicare Important Message Given:  Yes    Marily MemosLisa M Cleaster Shiffer, RN 05/09/2015, 12:46 PM

## 2015-05-09 NOTE — Progress Notes (Signed)
Windy notified RN of a critical lab of K 2.7. MD notified. New orders given, K-dur 40 mEq PO once. On-coming RN made aware of critical lab.   Karsten RoLauren E Hobbs

## 2015-05-09 NOTE — Discharge Instructions (Signed)

## 2015-05-14 ENCOUNTER — Encounter: Payer: Self-pay | Admitting: Pain Medicine

## 2015-05-14 ENCOUNTER — Ambulatory Visit: Payer: Medicare Other | Attending: Pain Medicine | Admitting: Pain Medicine

## 2015-05-14 VITALS — BP 186/109 | HR 88 | Temp 98.5°F | Resp 16 | Ht 65.0 in | Wt 265.0 lb

## 2015-05-14 DIAGNOSIS — G43109 Migraine with aura, not intractable, without status migrainosus: Secondary | ICD-10-CM

## 2015-05-14 DIAGNOSIS — M47816 Spondylosis without myelopathy or radiculopathy, lumbar region: Secondary | ICD-10-CM | POA: Insufficient documentation

## 2015-05-14 DIAGNOSIS — E1340 Other specified diabetes mellitus with diabetic neuropathy, unspecified: Secondary | ICD-10-CM

## 2015-05-14 DIAGNOSIS — Z9889 Other specified postprocedural states: Secondary | ICD-10-CM | POA: Insufficient documentation

## 2015-05-14 DIAGNOSIS — K2970 Gastritis, unspecified, without bleeding: Secondary | ICD-10-CM

## 2015-05-14 DIAGNOSIS — E1140 Type 2 diabetes mellitus with diabetic neuropathy, unspecified: Secondary | ICD-10-CM | POA: Diagnosis not present

## 2015-05-14 DIAGNOSIS — M5136 Other intervertebral disc degeneration, lumbar region: Secondary | ICD-10-CM | POA: Diagnosis not present

## 2015-05-14 DIAGNOSIS — R51 Headache: Secondary | ICD-10-CM | POA: Diagnosis present

## 2015-05-14 DIAGNOSIS — M5481 Occipital neuralgia: Secondary | ICD-10-CM | POA: Insufficient documentation

## 2015-05-14 DIAGNOSIS — G43009 Migraine without aura, not intractable, without status migrainosus: Secondary | ICD-10-CM

## 2015-05-14 DIAGNOSIS — R030 Elevated blood-pressure reading, without diagnosis of hypertension: Secondary | ICD-10-CM | POA: Insufficient documentation

## 2015-05-14 DIAGNOSIS — M792 Neuralgia and neuritis, unspecified: Secondary | ICD-10-CM | POA: Diagnosis not present

## 2015-05-14 DIAGNOSIS — M542 Cervicalgia: Secondary | ICD-10-CM | POA: Diagnosis present

## 2015-05-14 DIAGNOSIS — M170 Bilateral primary osteoarthritis of knee: Secondary | ICD-10-CM

## 2015-05-14 MED ORDER — OXYCODONE HCL 10 MG PO TABS: ORAL_TABLET | ORAL | Status: DC

## 2015-05-14 NOTE — Patient Instructions (Addendum)
PLAN   Continue present medication oxycodone. No Zanaflex at this time   F/U PCP Dr. Sampson GoonFitzgerald for evaliation of  BP diabetes mellitus and general medical  condition. Please see Dr. Sampson GoonFitzgerald TODAY for your elevated blood pressure as discussed   F/U surgical evaluation. As planned  F/U neurological evaluation. May consider pending follow-up evaluations  F/U Dr. Janeece RiggersSu for continued evaluation and treatment  May consider radiofrequency rhizolysis or intraspinal procedures pending response to present treatment and F/U evaluation   Patient to call [facility] should patient have concerns prior to scheduled return appointment.

## 2015-05-14 NOTE — Progress Notes (Signed)
Safety precautions to be maintained throughout the outpatient stay will include: orient to surroundings, keep bed in low position, maintain call bell within reach at all times, provide assistance with transfer out of bed and ambulation.  

## 2015-05-14 NOTE — Progress Notes (Signed)
Subjective:    Patient ID: Daisy [name], female    DOB: 1965-09-06, 49 y.o.   MRN: [id number]  HPI  The patient is a 49 year old female who returns to pain management Center for further evaluation and treatment of pain involving the region of the neck associated with headaches as well as pain of the upper mid lower back and lower extremity regions. The patient continues to have significant pain involving the abdominal region and will undergo further surgical evaluation with consideration being given to surgical intervention for pain of the abdominal region. Patient is with recent hospitalization for pain involving the abdominal region. On today's visit we discussed patient's overall condition and noted patient to be with elevated blood pressure. We will have patient undergo evaluation with Dr. Sampson Goon today for immediate evaluation and treatment of elevated blood pressure. Patient denied any deviation from her medication regimen or diet which could've contributed to the increased blood pressure. Caution patient regarding the side effects of blood pressure being significantly elevated. The patient expressed understanding and will follow-up Dr. Sampson Goon today for further evaluation and treatment. We will we also prescribed patient oxycodone and patient will follow-up with Dr. Janeece Riggers for further evaluation of site condition as discussed. We will avoid interventional treatment. The patient was understanding and agreed with suggested treatment plan     Review of Systems     Objective:   Physical Exam  There was tenderness to palpation of the splenius capitis and a separate talus musculature regions. Palpation of these regions reproduced pain of moderate degree. There were no bounding pulsations of the temporal region noted. There was tenderness to palpation of the temporomandibular joint region a mild degree. Palpation over the region of the acromioclavicular and glenohumeral joint regions  reproduce moderate discomfort. There appeared to be unremarkable Spurling's maneuver the patient appeared to be with slightly decreased grip strength and Tinel and Phalen's maneuver were without increased pain of significant degree. Palpation over the cervical facet cervical paraspinal musculature region associated with moderate discomfort. Palpation over the thoracic facet thoracic paraspinal must reason associated with moderate discomfort. There was crepitus of the thoracic region was noted. Palpation over the lumbar paraspinal musculature region lumbar facet region was with moderate discomfort. Lateral bending rotation extension and palpation of the lumbar facets reproduce moderate discomfort. Straight leg raising was limited to approximately 20 without increased pain with dorsiflexion noted. Palpation over the region of the knees were with evidence of degenerative changes of the knees with crepitus of the knees noted. There was negative anterior and posterior drawer signs without ballottement of the patella. Well-healed surgical scars of the knee were noted no increased warmth and erythema of the knees noted. EHL strength appeared to be decreased. There was question decreased sensation of the lower extremities in a stocking-type distribution. There was negative clonus negative Homans. Abdomen protuberant with tenderness to palpation without rebound tenderness to palpation. No costovertebral tenderness noted.         Assessment & Plan:     Degenerative disc disease of the lumbar spine Multilevel degenerative changes lumbar spine with L4-L5 level involving predominantly  Degenerative joint disease of knees Status post surgery of knees  Diabetes mellitus Diabetic neuropathy  Bilateral occipital neuralgia  Intercostal neuralgia    PLAN    Continue present medication oxycodone. No Zanaflex at this time   F/U PCP Dr. Sampson Goon for evaliation of  BP diabetes mellitus and general  medical  condition. Please see Dr. Sampson Goon TODAY for your elevated  blood pressure as discussed   F/U surgical evaluation. As planned  F/U neurological evaluation. May consider pending follow-up evaluations  F/U Dr. Janeece RiggersSu for continued evaluation and treatment  May consider radiofrequency rhizolysis or intraspinal procedures pending response to present treatment and F/U evaluation   Patient to call Pain Management Center should patient have concerns prior to scheduled return appointment.

## 2015-05-23 DIAGNOSIS — R109 Unspecified abdominal pain: Secondary | ICD-10-CM

## 2015-05-23 DIAGNOSIS — G8929 Other chronic pain: Secondary | ICD-10-CM | POA: Insufficient documentation

## 2015-05-23 DIAGNOSIS — N83299 Other ovarian cyst, unspecified side: Secondary | ICD-10-CM | POA: Insufficient documentation

## 2015-05-28 ENCOUNTER — Ambulatory Visit: Payer: Medicare Other | Admitting: Pain Medicine

## 2015-05-30 ENCOUNTER — Other Ambulatory Visit: Payer: Self-pay | Admitting: Pain Medicine

## 2015-06-12 ENCOUNTER — Encounter: Payer: Self-pay | Admitting: Pain Medicine

## 2015-06-12 ENCOUNTER — Ambulatory Visit: Payer: Medicare Other | Attending: Pain Medicine | Admitting: Pain Medicine

## 2015-06-12 VITALS — BP 104/62 | HR 83 | Temp 98.0°F | Resp 18 | Ht 64.0 in | Wt 284.0 lb

## 2015-06-12 DIAGNOSIS — M170 Bilateral primary osteoarthritis of knee: Secondary | ICD-10-CM

## 2015-06-12 DIAGNOSIS — M542 Cervicalgia: Secondary | ICD-10-CM | POA: Diagnosis present

## 2015-06-12 DIAGNOSIS — M5136 Other intervertebral disc degeneration, lumbar region: Secondary | ICD-10-CM

## 2015-06-12 DIAGNOSIS — G43009 Migraine without aura, not intractable, without status migrainosus: Secondary | ICD-10-CM

## 2015-06-12 DIAGNOSIS — M47816 Spondylosis without myelopathy or radiculopathy, lumbar region: Secondary | ICD-10-CM | POA: Insufficient documentation

## 2015-06-12 DIAGNOSIS — M5481 Occipital neuralgia: Secondary | ICD-10-CM | POA: Diagnosis not present

## 2015-06-12 DIAGNOSIS — E1340 Other specified diabetes mellitus with diabetic neuropathy, unspecified: Secondary | ICD-10-CM

## 2015-06-12 DIAGNOSIS — Z9889 Other specified postprocedural states: Secondary | ICD-10-CM | POA: Insufficient documentation

## 2015-06-12 DIAGNOSIS — G43109 Migraine with aura, not intractable, without status migrainosus: Secondary | ICD-10-CM

## 2015-06-12 DIAGNOSIS — E1140 Type 2 diabetes mellitus with diabetic neuropathy, unspecified: Secondary | ICD-10-CM | POA: Diagnosis not present

## 2015-06-12 DIAGNOSIS — R51 Headache: Secondary | ICD-10-CM | POA: Diagnosis present

## 2015-06-12 MED ORDER — OXYCODONE HCL 10 MG PO TABS: ORAL_TABLET | ORAL | Status: DC

## 2015-06-12 NOTE — Progress Notes (Signed)
Subjective:    Patient ID: Daisy [name], female    DOB: [birth year], 49 y.o.   MRN: [id number]  HPI  The patient is a 49 year old female who returns to pain management for further evaluation and treatment of pain involving the neck associated with headaches as well as pain involving the mid lower back lower extremity regions. The patient is status post surgery of the abdominal region performed by [facility] Des[facility] [place] [facility] [place] [facility]. The patient is scheduled to undergo follow-up evaluation with [facility] who will be replacing Dr. Desmond Dikeaybar . Dr. a bar has moved away from [place]. The patient is continuing home health nursing care and will undergo follow-up surgical evaluation as planned. We discussed patient's headaches as well as pain involving the neck upper mid lower back and lower extremity regions as well as the knees and will avoid interventional treatment. We will continue medications consisting of oxycodone at this time. The patient stated that she receive Zanaflex when she was in hospital for her surgery. We will consider resuming Zanaflex as discussed and explained to patient and patient will follow-up with Dr.Su as planned as well. We will avoid interventional treatment as discussed and explained to patient who was with understanding and in agreement with suggested treatment plan      Review of Systems     Objective:   Physical Exam  There was tenderness to palpation of the paraspinal muscular treat the cervical region cervical facet region palpation which be produced pain of mild-to-moderate degree. There was mild to moderate tenderness to palpation over the splenius capitis and occipitalis musculature regions. A shunt of the acromioclavicular and glenohumeral joint regions reproduced pain of mild-to-moderate degree. No masses of the head and neck were noted. Palpation over the thoracic facet thoracic paraspinal musculature region was attends to  palpation of moderate degree. No crepitus of the thoracic region was noted. The patient appeared to be with bilaterally equal grip strength and Tinel and Phalen's maneuver were without increased pain of significant degree. The patient appeared to be with unremarkable Spurling's maneuver. Palpation over the lumbar paraspinal musculatures and lumbar facet region was with moderate tends to palpation. There was moderate tenderness to palpation over the PSIS and PII S regions. The abdomen was with mild tenderness to palpation and was protuberant. Is with Hemovac drainage of the abdominal region. Palpation over the greater trochanteric region was with moderate tends to palpation. Straight leg raising was tolerates approximately 20 without increased pain with dorsiflexion noted. The knees were with crepitus. There was negative anterior and posterior drawer signs. EHL strength appeared to be decreased. No definite sensory deficit of dermatomal distribution of the lower extremity is noted. There was negative clonus negative Homans. No costovertebral tenderness was noted.      Assessment & Plan:       Degenerative disc disease of the lumbar spine Multilevel degenerative changes lumbar spine with L4-L5 level involving predominantly  Degenerative joint disease of knees Status post surgery of knees  Diabetes mellitus Diabetic neuropathy  Bilateral occipital neuralgia  Status post recent abdominal surgery      PLAN   Continue present medication oxycodone. No Zanaflex at this time   F/U PCP Dr. Sampson GoonFitzgerald for evaliation of  BP diabetes mellitus, recent abdominal surgery and general medical  condition  F/U surgical evaluation status post recent surgery of abdominal region Surgery was performed [facility]Patient will follow-up with his colleagues as  scheduled    F/U neurological evaluation. May consider pending follow-up evaluations  F/U Dr. Janeece Riggers for continued evaluation  and treatment  May consider radiofrequency rhizolysis or intraspinal procedures pending response to present treatment and F/U evaluation   Patient to call Pain Management Center should patient have concerns prior to scheduled return appointment.

## 2015-06-12 NOTE — Patient Instructions (Addendum)
PLAN   Continue present medication oxycodone. No Zanaflex at this time  F/U PCP Dr. Sampson GoonFitzgerald for evaliation of  BP diabetes mellitus and general medical  condition   F/U surgical evaluation status post recent surgery of abdominal region Surgery was [facility]bar [facility]Patient will follow-up with his colleagues as scheduled   F/U neurological evaluation. May consider pending follow-up evaluations  F/U Dr. Janeece RiggersSu for continued evaluation and treatment  May consider radiofrequency rhizolysis or intraspinal procedures pending response to present treatment and F/U evaluation   Patient to call Pain Management Center should patient have concerns prior to scheduled return appointment.

## 2015-06-12 NOTE — Progress Notes (Signed)
Safety precautions to be maintained throughout the outpatient stay will include: orient to surroundings, keep bed in low position, maintain call bell within reach at all times, provide assistance with transfer out of bed and ambulation.  Patient hospitalized 05/26/2015 to 06/03/2015  and from 06/09/2015 to 06/11/2015 - wound vac in place after hernia removal.

## 2015-06-19 ENCOUNTER — Emergency Department
Admission: EM | Admit: 2015-06-19 | Discharge: 2015-06-19 | Disposition: A | Discharge status: Home / Self Care | Payer: No Typology Code available for payment source | Attending: Emergency Medicine | Admitting: Emergency Medicine

## 2015-06-19 ENCOUNTER — Encounter: Payer: Self-pay | Admitting: Medical Oncology

## 2015-06-19 ENCOUNTER — Emergency Department: Payer: No Typology Code available for payment source

## 2015-06-19 DIAGNOSIS — S3992XA Unspecified injury of lower back, initial encounter: Secondary | ICD-10-CM | POA: Insufficient documentation

## 2015-06-19 DIAGNOSIS — Z7984 Long term (current) use of oral hypoglycemic drugs: Secondary | ICD-10-CM | POA: Insufficient documentation

## 2015-06-19 DIAGNOSIS — Z9889 Other specified postprocedural states: Secondary | ICD-10-CM | POA: Insufficient documentation

## 2015-06-19 DIAGNOSIS — Y92410 Unspecified street and highway as the place of occurrence of the external cause: Secondary | ICD-10-CM | POA: Insufficient documentation

## 2015-06-19 DIAGNOSIS — Z792 Long term (current) use of antibiotics: Secondary | ICD-10-CM | POA: Insufficient documentation

## 2015-06-19 DIAGNOSIS — I10 Essential (primary) hypertension: Secondary | ICD-10-CM | POA: Insufficient documentation

## 2015-06-19 DIAGNOSIS — Z79899 Other long term (current) drug therapy: Secondary | ICD-10-CM | POA: Diagnosis not present

## 2015-06-19 DIAGNOSIS — S0990XA Unspecified injury of head, initial encounter: Secondary | ICD-10-CM | POA: Insufficient documentation

## 2015-06-19 DIAGNOSIS — S161XXA Strain of muscle, fascia and tendon at neck level, initial encounter: Secondary | ICD-10-CM | POA: Diagnosis not present

## 2015-06-19 DIAGNOSIS — Y9389 Activity, other specified: Secondary | ICD-10-CM | POA: Diagnosis not present

## 2015-06-19 DIAGNOSIS — Y998 Other external cause status: Secondary | ICD-10-CM | POA: Diagnosis not present

## 2015-06-19 DIAGNOSIS — G8928 Other chronic postprocedural pain: Secondary | ICD-10-CM | POA: Insufficient documentation

## 2015-06-19 DIAGNOSIS — M25561 Pain in right knee: Secondary | ICD-10-CM

## 2015-06-19 DIAGNOSIS — S199XXA Unspecified injury of neck, initial encounter: Secondary | ICD-10-CM | POA: Diagnosis present

## 2015-06-19 DIAGNOSIS — Z79891 Long term (current) use of opiate analgesic: Secondary | ICD-10-CM | POA: Diagnosis not present

## 2015-06-19 DIAGNOSIS — E0840 Diabetes mellitus due to underlying condition with diabetic neuropathy, unspecified: Secondary | ICD-10-CM | POA: Insufficient documentation

## 2015-06-19 DIAGNOSIS — S8991XA Unspecified injury of right lower leg, initial encounter: Secondary | ICD-10-CM | POA: Insufficient documentation

## 2015-06-19 MED ORDER — OXYCODONE-ACETAMINOPHEN 5-325 MG PO TABS
1.0000 | ORAL_TABLET | Freq: Once | ORAL | Status: AC
  Administered 2015-06-19: 1 via ORAL
  Filled 2015-06-19: qty 1

## 2015-06-19 MED ORDER — ACETAMINOPHEN 500 MG PO TABS
1000.0000 mg | ORAL_TABLET | Freq: Once | ORAL | Status: DC
Start: 2015-06-19 — End: 2015-06-19
  Filled 2015-06-19: qty 2
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
## 2015-06-19 NOTE — Discharge Instructions (Signed)
Heat Therapy Heat therapy can help ease sore, stiff, injured, and tight muscles and joints. Heat relaxes your muscles, which may help ease your pain.  RISKS AND COMPLICATIONS If you have any of the following conditions, do not use heat therapy unless your health care provider has approved:  Poor circulation.  Healing wounds or scarred skin in the area being treated.  Diabetes, heart disease, or high blood pressure.  Not being able to feel (numbness) the area being treated.  Unusual swelling of the area being treated.  Active infections.  Blood clots.  Cancer.  Inability to communicate pain. This may include young children and people who have problems with their brain function (dementia).  Pregnancy. Heat therapy should only be used on old, pre-existing, or long-lasting (chronic) injuries. Do not use heat therapy on new injuries unless directed by your health care provider. HOW TO USE HEAT THERAPY There are several different kinds of heat therapy, including:  Moist heat pack.  Warm water bath.  Hot water bottle.  Electric heating pad.  Heated gel pack.  Heated wrap.  Electric heating pad. Use the heat therapy method suggested by your health care provider. Follow your health care provider's instructions on when and how to use heat therapy. GENERAL HEAT THERAPY RECOMMENDATIONS  Do not sleep while using heat therapy. Only use heat therapy while you are awake.  Your skin may turn pink while using heat therapy. Do not use heat therapy if your skin turns red.  Do not use heat therapy if you have new pain.  High heat or long exposure to heat can cause burns. Be careful when using heat therapy to avoid burning your skin.  Do not use heat therapy on areas of your skin that are already irritated, such as with a rash or sunburn. SEEK MEDICAL CARE IF:  You have blisters, redness, swelling, or numbness.  You have new pain.  Your pain is worse. MAKE SURE  YOU:  Understand these instructions.  Will watch your condition.  Will get help right away if you are not doing well or get worse.   This information is not intended to replace advice given to you by your health care provider. Make sure you discuss any questions you have with your health care provider.   Document Released: 08/23/2011 Document Revised: 06/21/2014 Document Reviewed: 07/24/2013 Elsevier Interactive Patient Education 2016 [employer]  Muscle Strain A muscle strain is an injury that occurs when a muscle is stretched beyond its normal length. Usually a small number of muscle fibers are torn when this happens. Muscle strain is rated in degrees. First-degree strains have the least amount of muscle fiber tearing and pain. Second-degree and third-degree strains have increasingly more tearing and pain.  Usually, recovery from muscle strain takes 1-2 weeks. Complete healing takes 5-6 weeks.  CAUSES  Muscle strain happens when a sudden, violent force placed on a muscle stretches it too far. This may occur with lifting, sports, or a fall.  RISK FACTORS Muscle strain is especially common in athletes.  SIGNS AND SYMPTOMS At the site of the muscle strain, there may be:  Pain.  Bruising.  Swelling.  Difficulty using the muscle due to pain or lack of normal function. DIAGNOSIS  Your health care provider will perform a physical exam and ask about your medical history. TREATMENT  Often, the best treatment for a muscle strain is resting, icing, and applying cold compresses to the injured area.  HOME CARE INSTRUCTIONS   Use the PRICE  method of treatment to promote muscle healing during the first 2-3 days after your injury. The PRICE method involves:  Protecting the muscle from being injured again.  Restricting your activity and resting the injured body part.  Icing your injury. To do this, put ice in a plastic bag. Place a towel between your skin and the bag. Then, apply the  ice and leave it on from 15-20 minutes each hour. After the third day, switch to moist heat packs.  Apply compression to the injured area with a splint or elastic bandage. Be careful not to wrap it too tightly. This may interfere with blood circulation or increase swelling.  Elevate the injured body part above the level of your heart as often as you can.  Only take over-the-counter or prescription medicines for pain, discomfort, or fever as directed by your health care provider.  Warming up prior to exercise helps to prevent future muscle strains. SEEK MEDICAL CARE IF:   You have increasing pain or swelling in the injured area.  You have numbness, tingling, or a significant loss of strength in the injured area. MAKE SURE YOU:   Understand these instructions.  Will watch your condition.  Will get help right away if you are not doing well or get worse.   This information is not intended to replace advice given to you by your health care provider. Make sure you discuss any questions you have with your health care provider.   Document Released: 05/31/2005 Document Revised: 03/21/2013 Document Reviewed: 12/28/2012 Elsevier Interactive Patient Education 2016 [employer] Inc.  Knee Pain Knee pain is a very common symptom and can have many causes. Knee pain often goes away when you follow your health care provider's instructions for relieving pain and discomfort at home. However, knee pain can develop into a condition that needs treatment. Some conditions may include:  Arthritis caused by wear and tear (osteoarthritis).  Arthritis caused by swelling and irritation (rheumatoid arthritis or gout).  A cyst or growth in your knee.  An infection in your knee joint.  An injury that will not heal.  Damage, swelling, or irritation of the tissues that support your knee (torn ligaments or tendinitis). If your knee pain continues, additional tests may be ordered to diagnose your condition. Tests  may include X-rays or other imaging studies of your knee. You may also need to have fluid removed from your knee. Treatment for ongoing knee pain depends on the cause, but treatment may include:  Medicines to relieve pain or swelling.  Steroid injections in your knee.  Physical therapy.  Surgery. HOME CARE INSTRUCTIONS  Take medicines only as directed by your health care provider.  Rest your knee and keep it raised (elevated) while you are resting.  Do not do things that cause or worsen pain.  Avoid high-impact activities or exercises, such as running, jumping rope, or doing jumping jacks.  Apply ice to the knee area:  Put ice in a plastic bag.  Place a towel between your skin and the bag.  Leave the ice on for 20 minutes, 2-3 times a day.  Ask your health care provider if you should wear an elastic knee support.  Keep a pillow under your knee when you sleep.  Lose weight if you are overweight. Extra weight can put pressure on your knee.  Do not use any tobacco products, including cigarettes, chewing tobacco, or electronic cigarettes. If you need help quitting, ask your health care provider. Smoking may slow the healing of  any bone and joint problems that you may have. SEEK MEDICAL CARE IF:  Your knee pain continues, changes, or gets worse.  You have a fever along with knee pain.  Your knee buckles or locks up.  Your knee becomes more swollen. SEEK IMMEDIATE MEDICAL CARE IF:   Your knee joint feels hot to the touch.  You have chest pain or trouble breathing.   This information is not intended to replace advice given to you by your health care provider. Make sure you discuss any questions you have with your health care provider.   Document Released: 03/28/2007 Document Revised: 06/21/2014 Document Reviewed: 01/14/2014 Elsevier Interactive Patient Education [employer]
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
## 2015-06-19 NOTE — ED Notes (Signed)
MD at bedside. 

## 2015-06-26 ENCOUNTER — Encounter: Payer: Self-pay | Admitting: Pain Medicine

## 2015-06-26 ENCOUNTER — Ambulatory Visit: Payer: Medicare Other | Attending: Pain Medicine | Admitting: Pain Medicine

## 2015-06-26 VITALS — BP 125/68 | HR 68 | Temp 98.7°F | Resp 18 | Ht 65.0 in | Wt 280.0 lb

## 2015-06-26 DIAGNOSIS — M5481 Occipital neuralgia: Secondary | ICD-10-CM | POA: Diagnosis not present

## 2015-06-26 DIAGNOSIS — E1140 Type 2 diabetes mellitus with diabetic neuropathy, unspecified: Secondary | ICD-10-CM | POA: Diagnosis not present

## 2015-06-26 DIAGNOSIS — R51 Headache: Secondary | ICD-10-CM | POA: Diagnosis present

## 2015-06-26 DIAGNOSIS — M542 Cervicalgia: Secondary | ICD-10-CM | POA: Diagnosis present

## 2015-06-26 DIAGNOSIS — M5136 Other intervertebral disc degeneration, lumbar region: Secondary | ICD-10-CM

## 2015-06-26 DIAGNOSIS — M47816 Spondylosis without myelopathy or radiculopathy, lumbar region: Secondary | ICD-10-CM | POA: Insufficient documentation

## 2015-06-26 DIAGNOSIS — Z9889 Other specified postprocedural states: Secondary | ICD-10-CM | POA: Diagnosis not present

## 2015-06-26 DIAGNOSIS — G43009 Migraine without aura, not intractable, without status migrainosus: Secondary | ICD-10-CM

## 2015-06-26 DIAGNOSIS — M170 Bilateral primary osteoarthritis of knee: Secondary | ICD-10-CM | POA: Diagnosis not present

## 2015-06-26 DIAGNOSIS — E1340 Other specified diabetes mellitus with diabetic neuropathy, unspecified: Secondary | ICD-10-CM

## 2015-06-26 DIAGNOSIS — G43109 Migraine with aura, not intractable, without status migrainosus: Secondary | ICD-10-CM

## 2015-06-26 MED ORDER — OXYCODONE HCL 10 MG PO TABS: ORAL_TABLET | ORAL | Status: DC

## 2015-06-26 NOTE — Progress Notes (Signed)
Subjective:    Patient ID: Daisy [name], female    DOB: 19-Jul-1965, 50 y.o.   MRN: [id number]  HPI  The patient is a 50 year old female who returns to pain management for further evaluation and treatment of pain involving the region of the neck associated with headaches as well as pain of the upper mid lower back and lower extremity regions. The patient is status post surgery [facility]. THIS PRESENT TIME PATIENT IS SCHEDULED TO UNDERGO FOLLOW-UP EVALUATION OF ABDOMINAL SURGERY. THE PATIENT IS WITH KNOWN DEGENERATIVE CHANGES OF THE CERVICAL THORACIC AND LUMBAR SPINE AS WELL AS KNEES. PATIENT IS FELT TO BE WITH SIGNIFICANT COMPONENT OF PAIN DUE TO INTRASPINAL ABNORMALITIES AS WELL AS COMPONENT OF GREATER OCCIPITAL NEURALGIA MYOFASCIAL PAIN RELATED HEADACHES. AT THE PRESENT TIME WE WILL AVOID CONSIDERING INTERVENTIONAL TREATMENT DUE TO PATIENT'S GENERAL MEDICAL CONDITION AND RECENT SURGERY. WE WILL REMAIN AVAILABLE TO CONSIDER PATIENT FOR MODIFICATION OF TREATMENT REGIMEN PENDING FOLLOW-UP EVALUATIONS. PATIENT WAS UNDERSTANDING AND IN AGREEMENT TO TREATMENT PLAN. THE PATIENT WILL UNDERGO FOLLOW-UP SURGICAL EVALUATION TODAY IF HE'LL VERSED [place] FOLLOWING PREVIOUS ABDOMINAL SURGERY. WE WILL CONTINUE OXYCODONE AS PRESCRIBED AT THIS TIME. PATIENT WAS WITH UNDERSTANDING AND AGREEMENT SUGGESTED TREATMENT PLAN       Review of Systems     Objective:   Physical Exam there was tenderness of the splenius capitis and a separate talus musculature region a moderate degree on the left as well as on the right. There was tenderness of the trapezius levator scapula and rhomboid musculature regions a moderate degree. Patient was with slightly decreased grip strength and Tinel and Phalen's maneuver were without increased pain of significant degree. There was tenderness over the region of the lower thoracic paraspinal must reason a moderate to moderately severe degree. Palpation of the lumbar  facets reproduced moderately severe discomfort with tenderness to palpation over the PSIS and PII S regions reproducing moderate discomfort as well palpation greater trochanteric region iliotibial band region was without increase of pain of significant degree straight leg raise was limited to approximately 20 without a definite increase of pain with dorsiflexion noted. There appeared to be negative clonus negative Homans. Abdomen was with tenderness to palpation of the right lower abdominal quadrant. Straight leg raise was limited to approximately 20. EHL strength was decreased. There was generalized decreased sensation of the lower extremities in a stocking-type distribution. There was well-healed surgical scars of the knees without increased warmth and erythema in the region of the knees with negative anterior and posterior drawer signs without ballottement of the patella. There was crepitus of the knee noted. No increased warmth or erythema noted in the region of the knee. EHL strength appeared to be decreased and there was generalized decreased sensation of the lower extremities in a stocking-type distribution. There appeared to be negative clonus negative Homans. Abdomen was protuberant with right lower quadrant tenderness to palpation and no costovertebral angle tenderness was noted.          Assessment & Plan:    Degenerative disc disease of the lumbar spine Multilevel degenerative changes lumbar spine with L4-L5 level involving predominantly  Degenerative joint disease of knees Status post surgery of knees  Diabetes mellitus Diabetic neuropathy  Bilateral occipital neuralgia  Status post recent abdominal surgery      PLAN   Continue present medication oxycodone. No Zanaflex at this time   F/U PCP Daisy [name] for evaliation of  BP diabetes mellitus and general medical condition as discussed  F/U surgical evaluation status post recent surgery of abdominal region Surgery  was [name]Patient will follow-up with his colleagues as scheduled today  F/U neurological evaluation. May consider pending follow-up evaluations  F/U Daisy [name] for continued evaluation and treatment  May consider radiofrequency rhizolysis or intraspinal procedures pending response to present treatment and F/U evaluation   Patient to call [facility] should patient have concerns prior to scheduled return appointment.

## 2015-06-26 NOTE — Progress Notes (Signed)
Safety precautions to be maintained throughout the outpatient stay will include: orient to surroundings, keep bed in low position, maintain call bell within reach at all times, provide assistance with transfer out of bed and ambulation.  

## 2015-06-26 NOTE — Patient Instructions (Addendum)
PLAN   Continue present medication oxycodone. No Zanaflex at this time   F/U PCP Dr. Sampson GoonFitzgerald for evaliation of  BP diabetes mellitus and general medical condition as discussed  F/U surgical evaluation status post recent surgery of abdominal region Surgery was performed [facility]Patient will follow-up with his colleagues as scheduled today  F/U neurological evaluation. May consider pending follow-up evaluations  F/U Dr. Janeece RiggersSu for continued evaluation and treatment  May consider radiofrequency rhizolysis or intraspinal procedures pending response to present treatment and F/U evaluation   Patient to call [facility] Management Center should patient have concerns prior to scheduled return appointment.

## 2015-07-16 ENCOUNTER — Telehealth: Payer: Self-pay | Admitting: Pain Medicine

## 2015-07-16 NOTE — Telephone Encounter (Signed)
Patient received #40 percocet  from surgeon after procedure. Can we call pharmacy to ok that this can be filled. Please advise.

## 2015-07-16 NOTE — Telephone Encounter (Signed)
Daisy [name] and Nurses Please discuss details of request for additional opioid

## 2015-07-16 NOTE — Telephone Encounter (Signed)
Pharmacy( [facility] at [facility]) called and patient informed that ok to fill RX from surgeon- per Dr. Metta Clines

## 2015-07-16 NOTE — Telephone Encounter (Signed)
Had to go back to hospital and have drain removal procedure, they gave her script for Oxycodone , pharmacy needs permission from our office before they will fill, please call patient asap

## 2015-07-22 ENCOUNTER — Ambulatory Visit: Payer: Medicare Other | Attending: Pain Medicine | Admitting: Pain Medicine

## 2015-07-22 ENCOUNTER — Encounter: Payer: Self-pay | Admitting: Pain Medicine

## 2015-07-22 VITALS — BP 164/101 | HR 87 | Temp 99.1°F | Resp 18 | Ht 64.0 in | Wt 279.0 lb

## 2015-07-22 DIAGNOSIS — M47816 Spondylosis without myelopathy or radiculopathy, lumbar region: Secondary | ICD-10-CM | POA: Diagnosis not present

## 2015-07-22 DIAGNOSIS — E669 Obesity, unspecified: Secondary | ICD-10-CM | POA: Insufficient documentation

## 2015-07-22 DIAGNOSIS — G43109 Migraine with aura, not intractable, without status migrainosus: Secondary | ICD-10-CM

## 2015-07-22 DIAGNOSIS — E1140 Type 2 diabetes mellitus with diabetic neuropathy, unspecified: Secondary | ICD-10-CM | POA: Insufficient documentation

## 2015-07-22 DIAGNOSIS — Z9889 Other specified postprocedural states: Secondary | ICD-10-CM | POA: Diagnosis not present

## 2015-07-22 DIAGNOSIS — M546 Pain in thoracic spine: Secondary | ICD-10-CM | POA: Diagnosis present

## 2015-07-22 DIAGNOSIS — M5136 Other intervertebral disc degeneration, lumbar region: Secondary | ICD-10-CM | POA: Insufficient documentation

## 2015-07-22 DIAGNOSIS — M5481 Occipital neuralgia: Secondary | ICD-10-CM | POA: Insufficient documentation

## 2015-07-22 DIAGNOSIS — M170 Bilateral primary osteoarthritis of knee: Secondary | ICD-10-CM | POA: Insufficient documentation

## 2015-07-22 DIAGNOSIS — R51 Headache: Secondary | ICD-10-CM | POA: Diagnosis present

## 2015-07-22 DIAGNOSIS — E1340 Other specified diabetes mellitus with diabetic neuropathy, unspecified: Secondary | ICD-10-CM

## 2015-07-22 DIAGNOSIS — R109 Unspecified abdominal pain: Secondary | ICD-10-CM | POA: Insufficient documentation

## 2015-07-22 DIAGNOSIS — M79606 Pain in leg, unspecified: Secondary | ICD-10-CM | POA: Diagnosis present

## 2015-07-22 DIAGNOSIS — G43009 Migraine without aura, not intractable, without status migrainosus: Secondary | ICD-10-CM

## 2015-07-22 MED ORDER — TIZANIDINE HCL 4 MG PO TABS: ORAL_TABLET | ORAL | Status: DC

## 2015-07-22 MED ORDER — OXYCODONE HCL 10 MG PO TABS: ORAL_TABLET | ORAL | Status: DC

## 2015-07-22 NOTE — Progress Notes (Signed)
Hospitalized at [facility] for a hernia repair with implantation of mesh 05/26/15 - 06/03/15. On 06/10/15 c/o abdominal pain; which she had a collection of fluid within the abdomen. It was treated with a VIR drain. She presented to the emergency room at [facility] on 07/13/15  for drain dislodgement, the drain was removed and the area was sutured.

## 2015-07-22 NOTE — Progress Notes (Signed)
Subjective:    Patient ID: Daisy [name], female    DOB: July 07, 1965, 50 y.o.   MRN: [id number]  HPI The patient is a 50 year old female who returns to pain management for further evaluation and treatment of pain involving headaches entire back upper and lower extremity regions and abdominal region. The patient has undergone surgical intervention of the abdominal region and recently had drained of the abdominal region removed. The patient is to follow-up with Daisy [name] of [facility] as planned the patient will follow-up with Daisy [name] for follow-up evaluation of her abdominal pain. The patient continues to be with pain involving the neck entire back upper and lower extremity regions and knees. We will avoid interventional treatment at this time and will continue patient's oxycodone. The patient will also be given prescription for Zanaflex and patient is to discuss the reasoning of Zanaflex with Daisy [name] prior to getting the prescription filled. The patient was given Zanaflex during her hospitalization as well as her oxycodone. The patient will call pain management should they be significant change in condition. All were understanding and agreement suggested treatment plan       Review of Systems     Objective:   Physical Exam There was tenderness to palpation of the paraspinal musculatures and of the cervical region cervical facet region of mild to moderate degree. There were no bounding pulsations of the temporal region. No masses of the head and neck were noted. There was moderate tenderness of the splenius capitis and occipitalis musculature regions. Palpation over the region of the cervical facet cervical paraspinal muscular region as well as the thoracic facet thoracic paraspinal must reason reproduced moderate discomfort. There was moderate tenderness of the acromioclavicular and glenohumeral joint region. The patient had unremarkable Spurling's maneuver.  Patient was with bilaterally equal grip strength and Tinel and Phalen's maneuver were without increase of pain of significant degree. Palpation over the thoracic facet thoracic paraspinal musculature region was with no crepitus of the thoracic region. There was moderate to moderately severe tenderness to palpation of the lower thoracic paraspinal musculature region. Palpation over the lumbar paraspinal must reason lumbar facet region was with moderate moderately severe tenderness to palpation with tenderness over the PSIS and PII S region as well as the gluteal and piriformis musculature regions of moderate degree to moderately severe degree. There was mild to moderate tenderness of the greater trochanteric region and iliotibial band region. Straight leg raise was tolerates approximately 20 without an increase of pain with dorsiflexion noted. The knees were with well-healed surgical scars of the knees with negative anterior and posterior drawer signs with no ballottement of the patella. Crepitus of the knees were noted. EHL strength appeared to be decreased. No sensory deficit or dermatomal distribution detected. There was questionably decreased sensation of the lower extremities. The abdomen was a tennis to palpation of mild degree with bandage of the right lower quadrant of the abdominal region. (Patient recently had drained removed). There was no costovertebral tenderness noted.           Assessment & Plan:     Degenerative disc disease of the lumbar spine Multilevel degenerative changes lumbar spine with L4-L5 level involving predominantly  Degenerative joint disease of knees Status post surgery of knees  Diabetes mellitus Diabetic neuropathy  Bilateral occipital neuralgia  Status post recent abdominal surgery  Obesity   PLAN   Continue present medication oxycodone. May resume Zanaflex after you have the consent of Daisy [name] as we discussed  today   F/U PCP Daisy [name]  for evaliation of  BP diabetes mellitus and general medical condition as discussed patient advised to follow up Daisy [name] for evaluation of elevated blood pressure and to obtain permission to resume Zanaflex  F/U surgical evaluation status post recent surgery of abdominal region and removal of drain. Surgery was performed by Daisy [name] who has moved from [facility].Patient will follow-up with his colleague Daisy [name] as discussed  F/U neurological evaluation as discussed  F/U Daisy [name] for continued evaluation and treatment  May consider radiofrequency rhizolysis or intraspinal procedures pending response to present treatment and F/U evaluation   Patient to call Pain Management Center should patient have concerns prior to scheduled return appointment.

## 2015-07-22 NOTE — Patient Instructions (Addendum)
PLAN   Continue present medication oxycodone. May resume Zanaflex after you have the consent of Dr.Fitzgerald as we discussed today   F/U PCP Dr. Sampson Goon for evaliation of  BP diabetes mellitus and general medical condition as discussed patient advised to follow up Dr. Sampson Goon for evaluation of elevated blood pressure and to obtain permission to resume Zanaflex  F/U surgical evaluation status post recent surgery of abdominal region and removal of drain. Surgery was performed by Dr.Raybar who has moved from [facility].Patient will follow-up with his colleague Dr. Annia Belt as discussed  F/U neurological evaluation as discussed  F/U Dr. Janeece Riggers for continued evaluation and treatment  May consider radiofrequency rhizolysis or intraspinal procedures pending response to present treatment and F/U evaluation   Patient to call Pain Management Center should patient have concerns prior to scheduled return appointment.

## 2015-07-22 NOTE — Progress Notes (Signed)
   Subjective:    Patient ID: Daisy [name], female    DOB: 11/07/1965, 50 y.o.   MRN: [id number]  HPI    Review of Systems     Objective:   Physical Exam        Assessment & Plan:

## 2015-08-08 ENCOUNTER — Other Ambulatory Visit: Payer: Self-pay | Admitting: Pain Medicine

## 2015-08-19 ENCOUNTER — Ambulatory Visit: Payer: Medicare Other | Attending: Pain Medicine | Admitting: Pain Medicine

## 2015-08-19 VITALS — BP 159/118 | HR 106 | Temp 98.7°F | Resp 16 | Ht 64.0 in | Wt 270.0 lb

## 2015-08-19 DIAGNOSIS — M5136 Other intervertebral disc degeneration, lumbar region: Secondary | ICD-10-CM | POA: Insufficient documentation

## 2015-08-19 DIAGNOSIS — M170 Bilateral primary osteoarthritis of knee: Secondary | ICD-10-CM | POA: Insufficient documentation

## 2015-08-19 DIAGNOSIS — Z9889 Other specified postprocedural states: Secondary | ICD-10-CM | POA: Insufficient documentation

## 2015-08-19 DIAGNOSIS — M47816 Spondylosis without myelopathy or radiculopathy, lumbar region: Secondary | ICD-10-CM | POA: Insufficient documentation

## 2015-08-19 DIAGNOSIS — E1140 Type 2 diabetes mellitus with diabetic neuropathy, unspecified: Secondary | ICD-10-CM | POA: Diagnosis not present

## 2015-08-19 DIAGNOSIS — G43009 Migraine without aura, not intractable, without status migrainosus: Secondary | ICD-10-CM

## 2015-08-19 DIAGNOSIS — E1340 Other specified diabetes mellitus with diabetic neuropathy, unspecified: Secondary | ICD-10-CM

## 2015-08-19 DIAGNOSIS — M25562 Pain in left knee: Secondary | ICD-10-CM | POA: Diagnosis present

## 2015-08-19 DIAGNOSIS — K2970 Gastritis, unspecified, without bleeding: Secondary | ICD-10-CM

## 2015-08-19 DIAGNOSIS — M5481 Occipital neuralgia: Secondary | ICD-10-CM | POA: Insufficient documentation

## 2015-08-19 DIAGNOSIS — R51 Headache: Secondary | ICD-10-CM | POA: Diagnosis present

## 2015-08-19 DIAGNOSIS — M25561 Pain in right knee: Secondary | ICD-10-CM | POA: Diagnosis present

## 2015-08-19 DIAGNOSIS — G43109 Migraine with aura, not intractable, without status migrainosus: Secondary | ICD-10-CM

## 2015-08-19 MED ORDER — OXYCODONE HCL 10 MG PO TABS: ORAL_TABLET | ORAL | Status: DC

## 2015-08-19 MED ORDER — TIZANIDINE HCL 4 MG PO TABS: ORAL_TABLET | ORAL | Status: DC

## 2015-08-19 NOTE — Progress Notes (Signed)
Subjective:    Patient ID: Daisy [name], female    DOB: 05/05/1966, 50 y.o.   MRN: [id number]  HPI  The patient is a 50 year old female who returns to pain management for further evaluation and treatment of pain involving pain of the neck entire back upper and lower extremity regions. The patient also has history of headaches as well ask pain involving the knees with prior surgery of the knees. The patient is undergone surgical intervention of the abdominal region and is with significant obesity. The patient is to undergo follow-up evaluation interested [place] to consider surgery for weight reduction. we will continue medications as prescribed at this time and patient is to follow-up with Daisy [name] for go to the emergency room today for evaluation of elevated blood pressure. The patient will proceed with evaluation by Daisy [name] for go to the emergency room as recommended. the patient denies any trauma change in events of daily living because change in symptomatology. we will continue medications oxycodone and zanaflex as prescribed. The patient will also follow up with Daisy [name]. All were in agreement with suggested treatment plan    Review of Systems     Objective:   Physical Exam There was moderate tenderness of the splenius capitis and occipitalis musculature region. Palpation of the cervical facet cervical paraspinal musculature region reproduces moderate discomfort. There was mild to moderate tenderness of the acromioclavicular and glenohumeral joint regions. The patient appeared to be with unremarkable Spurling's maneuver. Tinel and Phalen's maneuver were without significant increase of pain. The patient has slightly decreased grip strength. There was tenderness of the thoracic paraspinal must reason thoracic facet region a moderate degree. Lateral bending rotation extension and palpation of the lumbar facets reproduced moderately severe discomfort. There were well-healed surgical  scars of the knees without increased warmth and erythema in the knee with crepitus of the knee. There was negative anterior and posterior drawer signs without ballottement of the patella. Straight leg raising was tolerates approximately 20. EHL strength appeared to be decreased there was negative clonus and negative Homans There was no definite sensory deficit or dermatomal dystrophy detected. There was tenderness over the PSIS and PII S region a moderate degree. Abdomen protuberant with mild tinnitus to palpation. No rebound tenderness or shifting dullness of the abdomen was noted. No costovertebral tenderness was noted.       Assessment & Plan:     Degenerative disc disease of the lumbar spine Multilevel degenerative changes lumbar spine with L4-L5 level involving predominantly  Degenerative joint disease of knees Status post surgery of knees  Diabetes mellitus Diabetic neuropathy  Bilateral occipital neuralgia  Status post recent abdominal surgery  Morbid obesity     PLAN   Continue present medication oxycodone. May resume Zanaflex    F/U PCP Daisy. Daisy [name] for evaliation of  BP diabetes mellitus and general medical condition. Your blood pressure is extremely elevated and you need to go to emergency room today or see Daisy. Daisy [name] today for evaluation and treatment of extremely elevated blood pressure  F/U surgical evaluation status post recent surgery of abdominal region and removal of drain. Surgery [name]Patient will follow-up with his colleague Daisy [name] as discussed to consider gastric sleeve surgery  F/U neurological evaluation as discussed  F/U Daisy [name] for continued evaluation and treatment  May consider radiofrequency rhizolysis or intraspinal procedures pending response to present treatment and F/U evaluation . We we will avoid such procedures at  this time   Patient to call Pain Management Center should patient have  concerns prior to scheduled return appointment

## 2015-08-19 NOTE — Progress Notes (Signed)
Safety precautions to be maintained throughout the outpatient stay will include: orient to surroundings, keep bed in low position, maintain call bell within reach at all times, provide assistance with transfer out of bed and ambulation.  

## 2015-08-19 NOTE — Patient Instructions (Addendum)
PLAN   Continue present medication oxycodone. May resume Zanaflex    F/U PCP Dr. Sampson GoonFitzgerald for evaliation of  BP diabetes mellitus and general medical condition. Your blood pressure is extremely elevated and you need to go to emergency room today or see Dr. Sampson GoonFitzgerald today for evaluation and treatment of extremely elevated blood pressure  F/U surgical evaluation status post recent surgery of abdominal region and removal of drain. Surgery was [facility]Patient will follow-up with his colleague Dr. Annia BeltZadiq as discussed to consider gastric sleeve surgery  F/U neurological evaluation as discussed  F/U Dr. Janeece RiggersSu for continued evaluation and treatment  May consider radiofrequency rhizolysis or intraspinal procedures pending response to present treatment and F/U evaluation . We we will avoid such procedures at this time   Patient to call Pain Management Center should patient have concerns prior to scheduled return appointment.Pain Management Discharge Instructions  General Discharge Instructions :  If you need to reach your doctor call: Monday-Friday 8:00 am - 4:00 pm at [phone] or toll free [phone].  After clinic hours [phone] to have operator reach doctor.  Bring all of your medication bottles to all your appointments in the pain clinic.  To cancel or reschedule your appointment with Pain Management please remember to call 24 hours in advance to avoid a fee.  Refer to the educational materials which you have been given on: General Risks, I had my Procedure. Discharge Instructions, Post Sedation.  Post Procedure Instructions:  The drugs you were given will stay in your system until tomorrow, so for the next 24 hours you should not drive, make any legal decisions or drink any alcoholic beverages.  You may eat anything you prefer, but it is better to start with liquids then soups and crackers, and gradually work up to solid foods.  Please  notify your doctor immediately if you have any unusual bleeding, trouble breathing or pain that is not related to your normal pain.  Depending on the type of procedure that was done, some parts of your body may feel week and/or numb.  This usually clears up by tonight or the next day.  Walk with the use of an assistive device or accompanied by an adult for the 24 hours.  You may use ice on the affected area for the first 24 hours.  Put ice in a Ziploc bag and cover with a towel and place against area 15 minutes on 15 minutes off.  You may switch to heat after 24 hours.

## 2015-09-18 ENCOUNTER — Ambulatory Visit: Payer: Medicare Other | Attending: Pain Medicine | Admitting: Pain Medicine

## 2015-09-18 ENCOUNTER — Encounter: Payer: Self-pay | Admitting: Pain Medicine

## 2015-09-18 VITALS — BP 186/110 | HR 79 | Temp 98.2°F | Resp 15 | Ht 64.0 in | Wt 289.0 lb

## 2015-09-18 DIAGNOSIS — G43109 Migraine with aura, not intractable, without status migrainosus: Secondary | ICD-10-CM

## 2015-09-18 DIAGNOSIS — E1140 Type 2 diabetes mellitus with diabetic neuropathy, unspecified: Secondary | ICD-10-CM | POA: Insufficient documentation

## 2015-09-18 DIAGNOSIS — Z9889 Other specified postprocedural states: Secondary | ICD-10-CM | POA: Insufficient documentation

## 2015-09-18 DIAGNOSIS — G43009 Migraine without aura, not intractable, without status migrainosus: Secondary | ICD-10-CM

## 2015-09-18 DIAGNOSIS — M170 Bilateral primary osteoarthritis of knee: Secondary | ICD-10-CM | POA: Diagnosis not present

## 2015-09-18 DIAGNOSIS — M47816 Spondylosis without myelopathy or radiculopathy, lumbar region: Secondary | ICD-10-CM | POA: Insufficient documentation

## 2015-09-18 DIAGNOSIS — M5136 Other intervertebral disc degeneration, lumbar region: Secondary | ICD-10-CM

## 2015-09-18 DIAGNOSIS — M542 Cervicalgia: Secondary | ICD-10-CM | POA: Diagnosis present

## 2015-09-18 DIAGNOSIS — M546 Pain in thoracic spine: Secondary | ICD-10-CM | POA: Diagnosis present

## 2015-09-18 DIAGNOSIS — M5481 Occipital neuralgia: Secondary | ICD-10-CM | POA: Diagnosis not present

## 2015-09-18 DIAGNOSIS — E1340 Other specified diabetes mellitus with diabetic neuropathy, unspecified: Secondary | ICD-10-CM

## 2015-09-18 DIAGNOSIS — R51 Headache: Secondary | ICD-10-CM | POA: Diagnosis present

## 2015-09-18 MED ORDER — TIZANIDINE HCL 4 MG PO TABS: ORAL_TABLET | ORAL | Status: DC

## 2015-09-18 MED ORDER — OXYCODONE HCL 10 MG PO TABS: ORAL_TABLET | ORAL | Status: DC

## 2015-09-18 MED ORDER — DICLOFENAC SODIUM 1 % TD GEL: TRANSDERMAL | Status: DC

## 2015-09-18 NOTE — Patient Instructions (Addendum)
PLAN   Continue present medications Zanaflex and oxycodone and begin Voltaren gel applications to the knee   F/U PCP Dr. Sampson GoonFitzgerald for evaliation of  BP diabetes mellitus and general medical condition You need to see Dr. Sampson GoonFitzgerald today or go to the emergency room for evaluation and treatment of severely elevated blood pressure.   F/U surgical evaluation Surgery was [facility]Patient will follow-up with his colleague Dr. Annia BeltZadiq as discussed to consider gastric sleeve surgery. Follow-up with Dr. Delbert HarnessMurphy Wainer regarding knees and orthopedic condition as discussed  F/U neurological evaluation as discussed  F/U Dr. Janeece RiggersSu for continued evaluation and treatment  May consider radiofrequency rhizolysis or intraspinal procedures pending response to present treatment and F/U evaluation . We we will avoid such procedures at this time   Patient to call Pain Management Center should patient have concerns prior to scheduled return appointment.

## 2015-09-18 NOTE — Progress Notes (Signed)
Safety precautions to be maintained throughout the outpatient stay will include: orient to surroundings, keep bed in low position, maintain call bell within reach at all times, provide assistance with transfer out of bed and ambulation.  

## 2015-09-19 NOTE — Progress Notes (Signed)
Subjective:    Patient ID: Daisy [name], female    DOB: 1966-05-02, 50 y.o.   MRN: [id number]  HPI  The patient is a 50 year old female who returns to pain management for further evaluation and treatment of pain involving the neck headaches entire back and lower extremity region. The patient is with prior surgery of the abdominal region performed recently. The patient is to return to surgeon at [facility] to consider gastric sleeve surgery for weight reduction. The patient continues to be with significant pain involving the neck entire back and lower extremities and headaches. The patient states that she has had excruciating pain of the knees recently and was considering going to the emergency room for evaluation and treatment. After evaluation of patient the patient was informed to follow-up with Dr. Delbert Harness to discuss further evaluation and treatment of the patient's knees. The patient has been in the care of Dr. Delbert Harness for evaluation and treatment of her knees. Surgical intervention has been addressed and patient has been recommended for significant weight reduction. We discussed patient's elevated blood pressure on today's visit and advised patient to follow up Dr. Sampson Goon are to go to acute care chart to the emergency room immediately for further evaluation and treatment. We will continue patient's medications consisting of oxycodone and Zanaflex. The patient was with understanding and in agreement with suggested treatment plan. We will avoid interventional treatment at this time due to patient's general medical condition as discussed and as explained to patient. All were in agreement with suggested treatment plan    Review of Systems     Objective:   Physical Exam  There was tenderness of the splenius capitis and occipitalis musculature regions palpation which reproduced moderate discomfort. Palpation over the region of the cervical facet  cervical paraspinal musculature region was with moderate discomfort as well. Palpation over the acromioclavicular and glenohumeral joint regions were was tends to palpation of moderate degree. The patient was with unremarkable Spurling's maneuver. The patient appeared to be with bilaterally equal grip strength and Tinel and Phalen's maneuver were without increase of pain of significant degree. Palpation over the thoracic facet thoracic paraspinal musculature region was attends to palpation of moderate degree with moderately severe tenderness to palpation over the thoracic paraspinal musculature region on the left and right in the lower thoracic region. There was moderately severe tenderness of the PSIS and PII S regions. Lateral bending rotation extension and palpation over the lumbar facets reproduced moderately severe discomfort. Straight leg raising was limited to approximately 20 without a definite increase of pain with dorsiflexion noted. There were well-healed surgical scars of the knees with crepitus of the knees with no increased warmth erythema of the knees or ballottement of the patella. No increased joint laxity of the knees were noted. There was negative anterior and posterior drawer signs. Palpation of the knees reproduce severe pain. EHL strength appeared to be decreased without a definite sensory deficit or dermatomal distribution detected. There was moderately severe tenderness of the greater trochanteric region and iliotibial band region. Abdomen was with tenderness to palpation with no costovertebral tenderness noted      Assessment & Plan:     Degenerative disc disease of the lumbar spine Multilevel degenerative changes lumbar spine with L4-L5 level involving predominantly  Degenerative joint disease of knees Status post surgery of knees  Diabetes mellitus Diabetic neuropathy  Bilateral occipital neuralgia  Status post recent abdominal surgery  Morbid obesity  Diabetes  mellitus     PLAN   Continue present medications Zanaflex and oxycodone and begin Voltaren gel applications to the knee   F/U PCP Dr. Sampson GoonFitzgerald for evaliation of  BP diabetes mellitus and general medical condition You need to see Dr. Sampson GoonFitzgerald today or go to the emergency room for evaluation and treatment of severely elevated blood pressure.   F/U surgical evaluation Surgery [facility]Patient will follow-up with his colleague Dr. Annia BeltZadiq as discussed to consider gastric sleeve surgery. Follow-up with Dr. Delbert HarnessMurphy Wainer regarding knees and orthopedic condition as discussed  F/U neurological evaluation as discussed  F/U Dr. Janeece RiggersSu for continued evaluation and treatment  May consider radiofrequency rhizolysis or intraspinal procedures pending response to present treatment and F/U evaluation . We we will avoid such procedures at this time   Patient to call Pain Management Center should patient have concerns prior to scheduled return appointment.

## 2015-09-25 ENCOUNTER — Emergency Department
Admission: EM | Admit: 2015-09-25 | Discharge: 2015-09-25 | Disposition: A | Discharge status: Home / Self Care | Payer: Medicare Other | Attending: Emergency Medicine | Admitting: Emergency Medicine

## 2015-09-25 ENCOUNTER — Emergency Department: Payer: Medicare Other

## 2015-09-25 DIAGNOSIS — Z79899 Other long term (current) drug therapy: Secondary | ICD-10-CM | POA: Insufficient documentation

## 2015-09-25 DIAGNOSIS — R1010 Upper abdominal pain, unspecified: Secondary | ICD-10-CM | POA: Diagnosis not present

## 2015-09-25 DIAGNOSIS — R109 Unspecified abdominal pain: Secondary | ICD-10-CM

## 2015-09-25 DIAGNOSIS — Z7984 Long term (current) use of oral hypoglycemic drugs: Secondary | ICD-10-CM | POA: Diagnosis not present

## 2015-09-25 DIAGNOSIS — I10 Essential (primary) hypertension: Secondary | ICD-10-CM | POA: Diagnosis not present

## 2015-09-25 DIAGNOSIS — R079 Chest pain, unspecified: Secondary | ICD-10-CM | POA: Diagnosis present

## 2015-09-25 DIAGNOSIS — E119 Type 2 diabetes mellitus without complications: Secondary | ICD-10-CM | POA: Insufficient documentation

## 2015-09-25 DIAGNOSIS — F329 Major depressive disorder, single episode, unspecified: Secondary | ICD-10-CM | POA: Insufficient documentation

## 2015-09-25 LAB — URINALYSIS COMPLETE WITH MICROSCOPIC (ARMC ONLY)
Bilirubin Urine: NEGATIVE
Glucose, UA: NEGATIVE mg/dL
Nitrite: NEGATIVE
PH: 5 (ref 5.0–8.0)
Protein, ur: >500 mg/dL — AB
Specific Gravity, Urine: 1.026 (ref 1.005–1.030)

## 2015-09-25 LAB — CBC
HEMATOCRIT: 40 % (ref 35.0–47.0)
Hemoglobin: 13 g/dL (ref 12.0–16.0)
MCH: 25.9 pg — AB (ref 26.0–34.0)
MCHC: 32.5 g/dL (ref 32.0–36.0)
MCV: 79.6 fL — AB (ref 80.0–100.0)
PLATELETS: 496 10*3/uL — AB (ref 150–440)
RBC: 5.03 MIL/uL (ref 3.80–5.20)
RDW: 18.3 % — AB (ref 11.5–14.5)
WBC: 11 10*3/uL (ref 3.6–11.0)

## 2015-09-25 LAB — GLUCOSE, CAPILLARY: Glucose-Capillary: 174 mg/dL — ABNORMAL HIGH (ref 65–99)

## 2015-09-25 LAB — COMPREHENSIVE METABOLIC PANEL
ALBUMIN: 4.7 g/dL (ref 3.5–5.0)
ALT: 13 U/L — AB (ref 14–54)
AST: 16 U/L (ref 15–41)
Alkaline Phosphatase: 77 U/L (ref 38–126)
Anion gap: 14 (ref 5–15)
BILIRUBIN TOTAL: 0.7 mg/dL (ref 0.3–1.2)
BUN: 10 mg/dL (ref 6–20)
CHLORIDE: 103 mmol/L (ref 101–111)
CO2: 21 mmol/L — ABNORMAL LOW (ref 22–32)
CREATININE: 0.88 mg/dL (ref 0.44–1.00)
Calcium: 9.5 mg/dL (ref 8.9–10.3)
GFR calc Af Amer: >60 mL/min (ref >60)
GFR calc non Af Amer: >60 mL/min (ref >60)
GLUCOSE: 147 mg/dL — AB (ref 65–99)
POTASSIUM: 3.4 mmol/L — AB (ref 3.5–5.1)
Sodium: 138 mmol/L (ref 135–145)
Total Protein: 9.2 g/dL — ABNORMAL HIGH (ref 6.5–8.1)

## 2015-09-25 LAB — TROPONIN I: Troponin I: <0.03 ng/mL (ref <0.031)

## 2015-09-25 LAB — LIPASE, BLOOD: LIPASE: 17 U/L (ref 11–51)

## 2015-09-25 MED ORDER — OXYCODONE-ACETAMINOPHEN 5-325 MG PO TABS
2.0000 | ORAL_TABLET | Freq: Once | ORAL | Status: AC
  Administered 2015-09-25: 2 via ORAL
  Filled 2015-09-25: qty 2

## 2015-09-25 MED ORDER — LORAZEPAM 2 MG/ML IJ SOLN
1.0000 mg | Freq: Once | INTRAMUSCULAR | Status: AC
  Administered 2015-09-25: 1 mg via INTRAVENOUS
  Filled 2015-09-25: qty 1

## 2015-09-25 MED ORDER — SODIUM CHLORIDE 0.9 % IV BOLUS (SEPSIS)
1000.0000 mL | Freq: Once | INTRAVENOUS | Status: AC
  Administered 2015-09-25: 1000 mL via INTRAVENOUS

## 2015-09-25 MED ORDER — HYDROMORPHONE HCL 1 MG/ML IJ SOLN
1.0000 mg | Freq: Once | INTRAMUSCULAR | Status: AC
  Administered 2015-09-25: 1 mg via INTRAVENOUS
  Filled 2015-09-25: qty 1

## 2015-09-25 MED ORDER — PROMETHAZINE HCL 12.5 MG RE SUPP: 12.5000 mg | Freq: Four times a day (QID) | RECTAL | Status: DC | PRN

## 2015-09-25 MED ORDER — ONDANSETRON HCL 4 MG PO TABS: 4.0000 mg | ORAL_TABLET | Freq: Three times a day (TID) | ORAL | Status: DC | PRN

## 2015-09-25 MED ORDER — ONDANSETRON HCL 4 MG/2ML IJ SOLN
4.0000 mg | Freq: Once | INTRAMUSCULAR | Status: AC | PRN
  Administered 2015-09-25: 4 mg via INTRAVENOUS
  Filled 2015-09-25: qty 2

## 2015-09-25 MED ORDER — METOCLOPRAMIDE HCL 5 MG/ML IJ SOLN
10.0000 mg | Freq: Once | INTRAMUSCULAR | Status: AC
  Administered 2015-09-25: 10 mg via INTRAVENOUS
  Filled 2015-09-25: qty 2

## 2015-09-25 MED ORDER — KETAMINE HCL 10 MG/ML IJ SOLN
0.5000 mg/kg | Freq: Once | INTRAMUSCULAR | Status: AC
  Administered 2015-09-25: 29 mg via INTRAVENOUS
  Filled 2015-09-25: qty 1

## 2015-09-25 MED ORDER — CLONIDINE HCL 0.1 MG PO TABS
0.2000 mg | ORAL_TABLET | Freq: Once | ORAL | Status: AC
  Administered 2015-09-25: 0.2 mg via ORAL
  Filled 2015-09-25: qty 2

## 2015-09-25 MED ORDER — PROMETHAZINE HCL 12.5 MG PO TABS: 12.5000 mg | ORAL_TABLET | Freq: Four times a day (QID) | ORAL | Status: DC | PRN

## 2015-09-25 MED ORDER — PROCHLORPERAZINE EDISYLATE 5 MG/ML IJ SOLN
10.0000 mg | Freq: Once | INTRAMUSCULAR | Status: AC
  Administered 2015-09-25: 10 mg via INTRAVENOUS
  Filled 2015-09-25: qty 2

## 2015-09-25 NOTE — ED Notes (Signed)
Pt reports having chest discomfort from having so much emesis

## 2015-09-25 NOTE — ED Provider Notes (Signed)
Time Seen: Approximately 1820  I have reviewed the triage notes  Chief Complaint: Chest Pain; Emesis; and Nausea   History of Present Illness: Daisy [name] is a 50 y.o. female who presents with some persistent nausea and vomiting that started this morning. States she's had 3 loose watery stools. She describes some pain in the upper abdominal area and lower chest after vomiting. She's states she had a low-grade fever at home. She denies any hematemesis but states it did have some yellow emesis earlier. She denies any recent antibiotic therapy. She denies any recent travel. She denies any foodborne exposure.   Past Medical History  Diagnosis Date  . Diabetes mellitus without complication (HCC)   . Depression   . Hypertension   . Anxiety   . GERD (gastroesophageal reflux disease)   . Migraine   . Obesity     Patient Active Problem List   Diagnosis Date Noted  . Intractable abdominal pain 05/05/2015  . Gastritis 01/07/2015  . DDD (degenerative disc disease), lumbar 11/28/2014  . DJD (degenerative joint disease) of knee 11/28/2014  . Migraine headache 11/28/2014  . Bilateral occipital neuralgia 11/28/2014  . Morbid obesity (HCC) 11/28/2014  . Neuropathy due to secondary diabetes (HCC) 11/28/2014    Past Surgical History  Procedure Laterality Date  . Hernia repair    . Appendectomy    . Cholecystectomy    . Knee surgery    . Abdominal hysterectomy    . Application of wound vac      Past Surgical History  Procedure Laterality Date  . Hernia repair    . Appendectomy    . Cholecystectomy    . Knee surgery    . Abdominal hysterectomy    . Application of wound vac      Current Outpatient Rx  Name  Route  Sig  Dispense  Refill  . alprazolam (XANAX) 2 MG tablet   Oral   Take 2 mg by mouth 3 (three) times daily as needed for anxiety.          . diclofenac sodium (VOLTAREN) 1 % GEL      Apply 2-4 g to painful areas of the skin 4 times per day if tolerated. ONLY  APPLY A MAXIMUM OF 4 GRAMS PER APPLICATION   500 g   0   . FLUoxetine (PROZAC) 20 MG capsule   Oral   Take 20 mg by mouth daily.         . furosemide (LASIX) 20 MG tablet   Oral   Take 20 mg by mouth daily as needed.          . furosemide (LASIX) 20 MG tablet   Oral   Take 20 mg by mouth.         . lamoTRIgine (LAMICTAL) 100 MG tablet   Oral   Take 100 mg by mouth daily.         Marland Kitchen. lisinopril (PRINIVIL,ZESTRIL) 20 MG tablet   Oral   Take 20 mg by mouth daily.         . metFORMIN (GLUCOPHAGE) 500 MG tablet   Oral   Take 500 mg by mouth daily.         . metoprolol succinate (TOPROL-XL) 25 MG 24 hr tablet   Oral   Take 50 mg by mouth daily.         . nortriptyline (PAMELOR) 25 MG capsule   Oral   Take 25 mg by mouth 2 (two) times  daily as needed (for anxiety). Reported on 09/18/2015         . ondansetron (ZOFRAN) 4 MG tablet   Oral   Take 1 tablet (4 mg total) by mouth every 6 (six) hours as needed for nausea.   20 tablet   0   . Oxycodone HCl 10 MG TABS      Limit for 4 - 8 tabs by mouth per day if tolerated   240 tablet   0   . pantoprazole (PROTONIX) 40 MG tablet   Oral   Take 40 mg by mouth 2 (two) times daily.          . polyethylene glycol (MIRALAX / GLYCOLAX) packet   Oral   Take 17 g by mouth daily as needed for mild constipation.   14 each   0   . potassium chloride (KCL) 2 mEq/mL SOLN oral liquid   Oral   Take by mouth 2 (two) times daily as needed (takes every other day with lasix).         Marland Kitchen sucralfate (CARAFATE) 1 G tablet   Oral   Take 1 tablet (1 g total) by mouth 4 (four) times daily.   30 tablet   0   . tiZANidine (ZANAFLEX) 4 MG tablet      Limit 1-3 tabs by mouth per day if tolerated   80 tablet   0   . traZODone (DESYREL) 100 MG tablet   Oral   Take 100 mg by mouth at bedtime.         Marland Kitchen zolpidem (AMBIEN CR) 12.5 MG CR tablet   Oral   Take 12.5 mg by mouth at bedtime as needed for sleep.            Allergies:  Acetaminophen-codeine; Codeine; Ibuprofen; Morphine; Propoxyphene; Sulfa antibiotics; and Duloxetine  Family History: Family History  Problem Relation Age of Onset  . Arthritis Mother   . Depression Mother   . Diabetes Mother   . Heart disease Mother   . Hyperlipidemia Mother   . Hypertension Mother   . Stroke Mother   . Arthritis Father   . Diabetes Father   . Hearing loss Father   . Heart disease Father   . Hyperlipidemia Father   . Hypertension Father     Social History: Social History  Substance Use Topics  . Smoking status: Never Smoker   . Smokeless tobacco: None  . Alcohol Use: 0.0 oz/week    0 Standard drinks or equivalent per week     Comment: occasional     Review of Systems:   10 point review of systems was performed and was otherwise negative:  Constitutional: NoHigh fever Eyes: No visual disturbances ENT: No sore throat, ear pain Cardiac: Chest pain started after vomiting Respiratory: No shortness of breath, wheezing, or stridor Abdomen: Epigastric abdominal pain, no hematemesis, melena or hematochezia Endocrine: No weight loss, No night sweats Extremities: No peripheral edema, cyanosis Skin: No rashes, easy bruising Neurologic: No focal weakness, trouble with speech or swollowing Urologic: No dysuria, Hematuria, or urinary frequency   Physical Exam:  ED Triage Vitals  Enc Vitals Group     BP 09/25/15 1641 210/100 mmHg     Pulse Rate 09/25/15 1637 60     Resp 09/25/15 1637 20     Temp 09/25/15 1637 98.9 F (37.2 C)     Temp Source 09/25/15 1637 Oral     SpO2 09/25/15 1637 100 %  Weight 09/25/15 1637 285 lb (129.275 kg)     Height 09/25/15 1637  (1.651 m)     Head Cir --      Peak Flow --      Pain Score 09/25/15 1637 9     Pain Loc --      Pain Edu? --      Excl. in GC? --     General: Awake , Alert , and Oriented times 3; GCS 15Very anxious Head: Normal cephalic , atraumatic Eyes: Pupils equal , round,  reactive to light Nose/Throat: No nasal drainage, patent upper airway without erythema or exudate.  Neck: Supple, Full range of motion, No anterior adenopathy or palpable thyroid masses Lungs: Clear to ascultation without wheezes , rhonchi, or rales Heart: Regular rate, regular rhythm without murmurs , gallops , or rubs Abdomen: Morbidly obese, tender toward the epigastric area without any peritoneal signs. No palpable masses. Bowel sounds are present and symmetric throughout.Marland Kitchen        Extremities: 2 plus symmetric pulses. No edema, clubbing or cyanosis Neurologic: normal ambulation, Motor symmetric without deficits, sensory intact Skin: warm, dry, no rashes   Labs:   All laboratory work was reviewed including any pertinent negatives or positives listed below:  Labs Reviewed  COMPREHENSIVE METABOLIC PANEL - Abnormal; Notable for the following:    Potassium 3.4 (*)    CO2 21 (*)    Glucose, Bld 147 (*)    Total Protein 9.2 (*)    ALT 13 (*)    All other components within normal limits  CBC - Abnormal; Notable for the following:    MCV 79.6 (*)    MCH 25.9 (*)    RDW 18.3 (*)    Platelets 496 (*)    All other components within normal limits  URINALYSIS COMPLETEWITH MICROSCOPIC ([facility] ONLY) - Abnormal; Notable for the following:    Color, Urine YELLOW (*)    APPearance HAZY (*)    Ketones, ur TRACE (*)    Hgb urine dipstick 1+ (*)    Protein, ur >500 (*)    Leukocytes, UA TRACE (*)    Bacteria, UA RARE (*)    Squamous Epithelial / LPF 6-30 (*)    All other components within normal limits  LIPASE, BLOOD  TROPONIN I    EKG: * ED ECG REPORT I, Jennye Moccasin, the attending physician, personally viewed and interpreted this ECG.  Date: 09/25/2015 EKG Time: 1635 Rate: 56 Rhythm: Sinus bradycardia QRS Axis: normal Intervals: normal ST/T Wave abnormalities: normal Conduction Disturbances: none Narrative Interpretation: unremarkable No acute ischemic  changes   Radiology:  EXAM: CT ABDOMEN AND PELVIS WITHOUT CONTRAST  TECHNIQUE: Multidetector CT imaging of the abdomen and pelvis was performed following the standard protocol without oral or intravenous contrast material administration.  COMPARISON: May 05, 2015  FINDINGS: Lower chest: There is mild atelectasis in the anterior left lung base. Lung bases are otherwise clear. There is a focal hiatal hernia.  Hepatobiliary: Liver is prominent, measuring 20.1 cm in length. No focal liver lesions are apparent. Gallbladder is absent. There is no appreciable biliary duct dilatation.  Pancreas: There is no pancreatic mass or inflammatory focus.  Spleen: No splenic lesions are evident.  Adrenals/Urinary Tract: Adrenals appear normal bilaterally. Kidneys bilaterally show no mass or hydronephrosis on either side. There is no renal or ureteral calculus on either side. There are vascular calcifications immediately adjacent to but separate from the right ureter at the level of L4. Urinary  bladder is midline with wall thickness within normal limits.  Stomach/Bowel: There is no bowel wall or mesenteric thickening. There are loops of bowel which appear adhesed to the anterior right abdominal wall in the area of recent hernia repair. There is no bowel obstruction. No free air or portal venous air.  Vascular/Lymphatic: Aorta is mildly tortuous. There is no abdominal aortic aneurysm. No vascular lesions are evident. There is no adenopathy in the abdomen or pelvis.  Reproductive: Uterus is absent. There is no pelvic mass or pelvic fluid collection.  Other: Appendix is absent. There is no ascites or abscess in the abdomen or pelvis.  Musculoskeletal: There is soft tissue thickening and stranding in the right abdominal wall in the area of recent hernia repair. There is probable liquefying hematoma in this area. A well-defined abscess is not seen in this area.  There are no  blastic or lytic bone lesions. Inflammation is noted in the right anterior rectus muscle in the area of recent hernia repair.  IMPRESSION: Inflammatory change in the anterior right upper abdominal wall in the area of recent are hernia repair. There is no well-defined abscess or fluid collection in this area. Bowel appears adhesed to the wall of the anterior upper abdomen in the area of recent hernia repair without bowel obstruction or mesenteric thickening.  No bowel obstruction. No abscess.  Uterus and appendix absent.  No renal or ureteral calculus. No hydronephrosis.  There is a focal hiatal hernia.   Electronically Signed By: Bretta Bang III M.D. On: 09/25/2015 21:39          DG Abd Acute W/Chest (Final result) Result time: 09/25/15 19:00:56   Final result by Rad Results In Interface (09/25/15 19:00:56)   Narrative:   CLINICAL DATA: Chest and abdominal pain  EXAM: DG ABDOMEN ACUTE W/ 1V CHEST  COMPARISON: Abdominal CT 05/05/2015  FINDINGS: Apparent cardiomegaly is likely accentuated by technique and apical fat pad.  Nonobstructive bowel gas pattern.  Right flank calcifications are phleboliths based on comparison CT. No concerning intra-abdominal mass effect or calcification. Bowel sutures over the right abdomen and cholecystectomy clips.  Lower lumbar facet arthropathy.  IMPRESSION: Normal bowel gas pattern. No evidence of acute cardiopulmonary disease.            I personally reviewed the radiologic studies    ED Course: * Patient's stay here was uneventful. She still had persistent epigastric pain but no obvious surgical source and I felt this was still acute gastroenteritis with nausea vomiting and loose stool. Her laboratory work appears to be within normal limits. EKG is normal. X-rays three-way and also CAT scan evaluation shows no bowel obstruction or any surgical reasons for abdominal pain and nausea and vomiting. She  was given IV fluid and anti-medic therapy and her nausea and vomiting became under control and she'll be discharged on anti-medic therapy. Patient's blood pressure is still elevated though she has a history of chronic hypertension was unable to keep her normal blood pressure medications down at home due to the nausea and vomiting. With her able now to tolerate oral fluids etc. that she should take her  blood pressure medication at home . Patient was given clonidine here in emergency department.    Assessment:  Nausea, vomiting, diarrhea Acute exacerbation of chronic abdominal pain      Plan:  Outpatient management Patient was advised to return immediately if condition worsens. Patient was advised to follow up with their primary care physician or other specialized physicians involved in  their outpatient care. The patient and/or family member/power of attorney had laboratory results reviewed at the bedside. All questions and concerns were addressed and appropriate discharge instructions were distributed by the nursing staff.            Jennye Moccasin, MD 09/25/15 2232

## 2015-09-25 NOTE — ED Notes (Signed)
Pt comes into the ED via EMS from home with c/o N/V since this morning.. States she is having chest and upper abd pain since vomiting so many times/pain with movement..Marland Kitchen

## 2015-09-25 NOTE — Discharge Instructions (Signed)
Abdominal Pain, Adult °Many things can cause abdominal pain. Usually, abdominal pain is not caused by a disease and will improve without treatment. It can often be observed and treated at home. Your health care provider will do a physical exam and possibly order blood tests and X-rays to help determine the seriousness of your pain. However, in many cases, more time must pass before a clear cause of the pain can be found. Before that point, your health care provider may not know if you need more testing or further treatment. °HOME CARE INSTRUCTIONS °Monitor your abdominal pain for any changes. The following actions may help to alleviate any discomfort you are experiencing: °· Only take over-the-counter or prescription medicines as directed by your health care provider. °· Do not take laxatives unless directed to do so by your health care provider. °· Try a clear liquid diet (broth, tea, or water) as directed by your health care provider. Slowly move to a bland diet as tolerated. °SEEK MEDICAL CARE IF: °· You have unexplained abdominal pain. °· You have abdominal pain associated with nausea or diarrhea. °· You have pain when you urinate or have a bowel movement. °· You experience abdominal pain that wakes you in the night. °· You have abdominal pain that is worsened or improved by eating food. °· You have abdominal pain that is worsened with eating fatty foods. °· You have a fever. °SEEK IMMEDIATE MEDICAL CARE IF: °· Your pain does not go away within 2 hours. °· You keep throwing up (vomiting). °· Your pain is felt only in portions of the abdomen, such as the right side or the left lower portion of the abdomen. °· You pass bloody or black tarry stools. °MAKE SURE YOU: °· Understand these instructions. °· Will watch your condition. °· Will get help right away if you are not doing well or get worse. °  °This information is not intended to replace advice given to you by your health care provider. Make sure you discuss  any questions you have with your health care provider. °  °Document Released: 03/10/2005 Document Revised: 02/19/2015 Document Reviewed: 02/07/2013 °[employer] Interactive Patient Education ©2016 [employer] ° ° °Please return immediately if condition worsens. Please contact her primary physician or the physician you were given for referral. If you have any specialist physicians involved in her treatment and plan please also contact them. Thank you for using [facility]. ° °

## 2015-09-27 ENCOUNTER — Observation Stay
Admission: EM | Admit: 2015-09-27 | Discharge: 2015-09-29 | Disposition: A | Discharge status: Home / Self Care | Payer: Medicare Other | Attending: Internal Medicine | Admitting: Internal Medicine

## 2015-09-27 ENCOUNTER — Encounter: Payer: Self-pay | Admitting: *Deleted

## 2015-09-27 ENCOUNTER — Emergency Department: Payer: Medicare Other

## 2015-09-27 DIAGNOSIS — F419 Anxiety disorder, unspecified: Secondary | ICD-10-CM | POA: Insufficient documentation

## 2015-09-27 DIAGNOSIS — G43909 Migraine, unspecified, not intractable, without status migrainosus: Secondary | ICD-10-CM | POA: Insufficient documentation

## 2015-09-27 DIAGNOSIS — F329 Major depressive disorder, single episode, unspecified: Secondary | ICD-10-CM | POA: Diagnosis not present

## 2015-09-27 DIAGNOSIS — A084 Viral intestinal infection, unspecified: Principal | ICD-10-CM | POA: Insufficient documentation

## 2015-09-27 DIAGNOSIS — I10 Essential (primary) hypertension: Secondary | ICD-10-CM | POA: Insufficient documentation

## 2015-09-27 DIAGNOSIS — Z885 Allergy status to narcotic agent status: Secondary | ICD-10-CM | POA: Insufficient documentation

## 2015-09-27 DIAGNOSIS — Z820 Family history of epilepsy and other diseases of the nervous system: Secondary | ICD-10-CM | POA: Diagnosis not present

## 2015-09-27 DIAGNOSIS — M5481 Occipital neuralgia: Secondary | ICD-10-CM | POA: Insufficient documentation

## 2015-09-27 DIAGNOSIS — M179 Osteoarthritis of knee, unspecified: Secondary | ICD-10-CM | POA: Diagnosis not present

## 2015-09-27 DIAGNOSIS — Z7984 Long term (current) use of oral hypoglycemic drugs: Secondary | ICD-10-CM | POA: Diagnosis not present

## 2015-09-27 DIAGNOSIS — Z818 Family history of other mental and behavioral disorders: Secondary | ICD-10-CM | POA: Diagnosis not present

## 2015-09-27 DIAGNOSIS — Z8249 Family history of ischemic heart disease and other diseases of the circulatory system: Secondary | ICD-10-CM | POA: Diagnosis not present

## 2015-09-27 DIAGNOSIS — Z90710 Acquired absence of both cervix and uterus: Secondary | ICD-10-CM | POA: Diagnosis not present

## 2015-09-27 DIAGNOSIS — Z79899 Other long term (current) drug therapy: Secondary | ICD-10-CM | POA: Diagnosis not present

## 2015-09-27 DIAGNOSIS — Z8261 Family history of arthritis: Secondary | ICD-10-CM | POA: Diagnosis not present

## 2015-09-27 DIAGNOSIS — Z887 Allergy status to serum and vaccine status: Secondary | ICD-10-CM | POA: Insufficient documentation

## 2015-09-27 DIAGNOSIS — Z888 Allergy status to other drugs, medicaments and biological substances status: Secondary | ICD-10-CM | POA: Insufficient documentation

## 2015-09-27 DIAGNOSIS — Z6841 Body Mass Index (BMI) 40.0 and over, adult: Secondary | ICD-10-CM | POA: Diagnosis not present

## 2015-09-27 DIAGNOSIS — Z9049 Acquired absence of other specified parts of digestive tract: Secondary | ICD-10-CM | POA: Insufficient documentation

## 2015-09-27 DIAGNOSIS — Z833 Family history of diabetes mellitus: Secondary | ICD-10-CM | POA: Insufficient documentation

## 2015-09-27 DIAGNOSIS — R1010 Upper abdominal pain, unspecified: Secondary | ICD-10-CM

## 2015-09-27 DIAGNOSIS — R1110 Vomiting, unspecified: Secondary | ICD-10-CM

## 2015-09-27 DIAGNOSIS — Z823 Family history of stroke: Secondary | ICD-10-CM | POA: Diagnosis not present

## 2015-09-27 DIAGNOSIS — E876 Hypokalemia: Secondary | ICD-10-CM | POA: Diagnosis not present

## 2015-09-27 DIAGNOSIS — E1140 Type 2 diabetes mellitus with diabetic neuropathy, unspecified: Secondary | ICD-10-CM | POA: Insufficient documentation

## 2015-09-27 DIAGNOSIS — K219 Gastro-esophageal reflux disease without esophagitis: Secondary | ICD-10-CM | POA: Diagnosis not present

## 2015-09-27 DIAGNOSIS — M5136 Other intervertebral disc degeneration, lumbar region: Secondary | ICD-10-CM | POA: Diagnosis not present

## 2015-09-27 DIAGNOSIS — Z882 Allergy status to sulfonamides status: Secondary | ICD-10-CM | POA: Insufficient documentation

## 2015-09-27 DIAGNOSIS — K529 Noninfective gastroenteritis and colitis, unspecified: Secondary | ICD-10-CM | POA: Diagnosis present

## 2015-09-27 LAB — URINALYSIS COMPLETE WITH MICROSCOPIC (ARMC ONLY)
BILIRUBIN URINE: NEGATIVE
GLUCOSE, UA: NEGATIVE mg/dL
LEUKOCYTES UA: NEGATIVE
Nitrite: NEGATIVE
Protein, ur: >500 mg/dL — AB
SPECIFIC GRAVITY, URINE: 1.017 (ref 1.005–1.030)
pH: 6 (ref 5.0–8.0)

## 2015-09-27 LAB — CBC WITH DIFFERENTIAL/PLATELET
Basophils Absolute: 0.1 10*3/uL (ref 0–0.1)
Basophils Relative: 1 %
EOS ABS: 0.1 10*3/uL (ref 0–0.7)
EOS PCT: 1 %
HCT: 40.9 % (ref 35.0–47.0)
Hemoglobin: 12.9 g/dL (ref 12.0–16.0)
LYMPHS ABS: 1.5 10*3/uL (ref 1.0–3.6)
Lymphocytes Relative: 13 %
MCH: 25.4 pg — AB (ref 26.0–34.0)
MCHC: 31.7 g/dL — AB (ref 32.0–36.0)
MCV: 80.1 fL (ref 80.0–100.0)
MONOS PCT: 3 %
Monocytes Absolute: 0.4 10*3/uL (ref 0.2–0.9)
Neutro Abs: 9.9 10*3/uL — ABNORMAL HIGH (ref 1.4–6.5)
Neutrophils Relative %: 82 %
PLATELETS: 511 10*3/uL — AB (ref 150–440)
RBC: 5.11 MIL/uL (ref 3.80–5.20)
RDW: 17.7 % — ABNORMAL HIGH (ref 11.5–14.5)
WBC: 11.9 10*3/uL — ABNORMAL HIGH (ref 3.6–11.0)

## 2015-09-27 LAB — LIPASE, BLOOD: Lipase: 16 U/L (ref 11–51)

## 2015-09-27 LAB — COMPREHENSIVE METABOLIC PANEL
ALBUMIN: 4.7 g/dL (ref 3.5–5.0)
ALK PHOS: 70 U/L (ref 38–126)
ALT: 13 U/L — AB (ref 14–54)
AST: 14 U/L — AB (ref 15–41)
Anion gap: 10 (ref 5–15)
BILIRUBIN TOTAL: 1 mg/dL (ref 0.3–1.2)
BUN: 13 mg/dL (ref 6–20)
CO2: 25 mmol/L (ref 22–32)
Calcium: 9.1 mg/dL (ref 8.9–10.3)
Chloride: 102 mmol/L (ref 101–111)
Creatinine, Ser: 0.83 mg/dL (ref 0.44–1.00)
GFR calc Af Amer: >60 mL/min (ref >60)
GLUCOSE: 163 mg/dL — AB (ref 65–99)
POTASSIUM: 2.9 mmol/L — AB (ref 3.5–5.1)
Sodium: 137 mmol/L (ref 135–145)
TOTAL PROTEIN: 8.9 g/dL — AB (ref 6.5–8.1)

## 2015-09-27 LAB — TROPONIN I

## 2015-09-27 MED ORDER — HYDROMORPHONE HCL 1 MG/ML IJ SOLN
0.5000 mg | Freq: Once | INTRAMUSCULAR | Status: AC
  Administered 2015-09-27: 0.5 mg via INTRAVENOUS
  Filled 2015-09-27: qty 1

## 2015-09-27 MED ORDER — ONDANSETRON HCL 4 MG/2ML IJ SOLN
4.0000 mg | Freq: Once | INTRAMUSCULAR | Status: AC
  Administered 2015-09-27: 4 mg via INTRAVENOUS
  Filled 2015-09-27: qty 2

## 2015-09-27 MED ORDER — SODIUM CHLORIDE 0.9 % IV BOLUS (SEPSIS)
1000.0000 mL | Freq: Once | INTRAVENOUS | Status: AC
  Administered 2015-09-27: 1000 mL via INTRAVENOUS

## 2015-09-27 MED ORDER — FENTANYL CITRATE (PF) 100 MCG/2ML IJ SOLN
50.0000 ug | Freq: Once | INTRAMUSCULAR | Status: AC
  Administered 2015-09-27: 50 ug via INTRAVENOUS
  Filled 2015-09-27: qty 2

## 2015-09-27 NOTE — ED Provider Notes (Signed)
[facility] Emergency Department Provider Note  ____________________________________________  Time seen: Seen upon arrival to the emergency department  I have reviewed the triage vital signs and the nursing notes.   HISTORY  Chief Complaint Emesis and Abdominal Pain    HPI Daisy [name] is a 50 y.o. female with a history of diabetes, hypertension and obesity who is presenting to the emergency department with upper abdominal pain which is worse to the left upper quadrant. She says that she was here 2 days ago and evaluated and was able to be discharged home at that time. She says that the symptoms resolved yesterday but returned today with cramping upper abdominal pain with multiple episodes of vomiting, yellow vomitus. She says that she also has reported several episodes of diarrhea which is been nonbloody. She says that she takes Protonix and also has a history of gastritis but the symptoms have not been relieved. This past December she had a drain removed for an abscess in her abdomen. She has not had any issues since that procedure. [facility]. She is also had her appendix as well as her gallbladder removed in the past.History of a hysterectomy. Does not take any estrogens. Past Medical History  Diagnosis Date  . Diabetes mellitus without complication (HCC)   . Depression   . Hypertension   . Anxiety   . GERD (gastroesophageal reflux disease)   . Migraine   . Obesity     Patient Active Problem List   Diagnosis Date Noted  . Intractable abdominal pain 05/05/2015  . Gastritis 01/07/2015  . DDD (degenerative disc disease), lumbar 11/28/2014  . DJD (degenerative joint disease) of knee 11/28/2014  . Migraine headache 11/28/2014  . Bilateral occipital neuralgia 11/28/2014  . Morbid obesity (HCC) 11/28/2014  . Neuropathy due to secondary diabetes (HCC) 11/28/2014    Past Surgical History  Procedure Laterality Date  . Hernia repair     . Appendectomy    . Cholecystectomy    . Knee surgery    . Abdominal hysterectomy    . Application of wound vac      Current Outpatient Rx  Name  Route  Sig  Dispense  Refill  . alprazolam (XANAX) 2 MG tablet   Oral   Take 2 mg by mouth 3 (three) times daily as needed for anxiety.          . diclofenac sodium (VOLTAREN) 1 % GEL      Apply 2-4 g to painful areas of the skin 4 times per day if tolerated. ONLY APPLY A MAXIMUM OF 4 GRAMS PER APPLICATION   500 g   0   . FLUoxetine (PROZAC) 20 MG capsule   Oral   Take 20 mg by mouth daily.         . furosemide (LASIX) 20 MG tablet   Oral   Take 20 mg by mouth daily as needed.          . furosemide (LASIX) 20 MG tablet   Oral   Take 20 mg by mouth.         . lamoTRIgine (LAMICTAL) 100 MG tablet   Oral   Take 100 mg by mouth daily.         Marland Kitchen. lisinopril (PRINIVIL,ZESTRIL) 20 MG tablet   Oral   Take 20 mg by mouth daily.         . metFORMIN (GLUCOPHAGE) 500 MG tablet   Oral   Take 500 mg  by mouth daily.         . metoprolol succinate (TOPROL-XL) 25 MG 24 hr tablet   Oral   Take 50 mg by mouth daily.         . nortriptyline (PAMELOR) 25 MG capsule   Oral   Take 25 mg by mouth 2 (two) times daily as needed (for anxiety). Reported on 09/18/2015         . ondansetron (ZOFRAN) 4 MG tablet   Oral   Take 1 tablet (4 mg total) by mouth every 8 (eight) hours as needed for nausea or vomiting.   21 tablet   0   . Oxycodone HCl 10 MG TABS      Limit for 4 - 8 tabs by mouth per day if tolerated   240 tablet   0   . pantoprazole (PROTONIX) 40 MG tablet   Oral   Take 40 mg by mouth 2 (two) times daily.          . polyethylene glycol (MIRALAX / GLYCOLAX) packet   Oral   Take 17 g by mouth daily as needed for mild constipation.   14 each   0   . potassium chloride (KCL) 2 mEq/mL SOLN oral liquid   Oral   Take by mouth 2 (two) times daily as needed (takes every other day with lasix).         .  promethazine (PHENERGAN) 12.5 MG suppository   Rectal   Place 1 suppository (12.5 mg total) rectally every 6 (six) hours as needed for nausea or vomiting.   12 each   0   . promethazine (PHENERGAN) 12.5 MG tablet   Oral   Take 1 tablet (12.5 mg total) by mouth every 6 (six) hours as needed for nausea or vomiting.   30 tablet   0   . sucralfate (CARAFATE) 1 G tablet   Oral   Take 1 tablet (1 g total) by mouth 4 (four) times daily.   30 tablet   0   . tiZANidine (ZANAFLEX) 4 MG tablet      Limit 1-3 tabs by mouth per day if tolerated   80 tablet   0   . traZODone (DESYREL) 100 MG tablet   Oral   Take 100 mg by mouth at bedtime.         Marland Kitchen zolpidem (AMBIEN CR) 12.5 MG CR tablet   Oral   Take 12.5 mg by mouth at bedtime as needed for sleep.           Allergies Acetaminophen-codeine; Codeine; Ibuprofen; Morphine; Propoxyphene; Sulfa antibiotics; and Duloxetine  Family History  Problem Relation Age of Onset  . Arthritis Mother   . Depression Mother   . Diabetes Mother   . Heart disease Mother   . Hyperlipidemia Mother   . Hypertension Mother   . Stroke Mother   . Arthritis Father   . Diabetes Father   . Hearing loss Father   . Heart disease Father   . Hyperlipidemia Father   . Hypertension Father     Social History Social History  Substance Use Topics  . Smoking status: Never Smoker   . Smokeless tobacco: None  . Alcohol Use: 0.0 oz/week    0 Standard drinks or equivalent per week     Comment: occasional    Review of Systems Constitutional: No fever/chills Eyes: No visual changes. ENT: No sore throat. Cardiovascular: Denies chest pain. Respiratory: Denies shortness of breath. Gastrointestinal:No constipation. Genitourinary:  Negative for dysuria. Musculoskeletal: Negative for back pain. Skin: Negative for rash. Neurological: Negative for headaches, focal weakness or numbness.  10-point ROS otherwise  negative.  ____________________________________________   PHYSICAL EXAM:  VITAL SIGNS: ED Triage Vitals  Enc Vitals Group     BP 09/27/15 1943 218/160 mmHg     Pulse Rate 09/27/15 1943 72     Resp 09/27/15 1943 20     Temp 09/27/15 1943 98.8 F (37.1 C)     Temp Source 09/27/15 1943 Oral     SpO2 09/27/15 1943 98 %     Weight 09/27/15 1943 290 lb (131.543 kg)     Height 09/27/15 1943  (1.651 m)     Head Cir --      Peak Flow --      Pain Score 09/27/15 1947 10     Pain Loc --      Pain Edu? --      Excl. in GC? --     Constitutional: Alert and oriented. Well appearing and in no acute distress. Eyes: Conjunctivae are normal. PERRL. EOMI. Head: Atraumatic. Nose: No congestion/rhinnorhea. Mouth/Throat: Mucous membranes are moist.  Oropharynx non-erythematous. Neck: No stridor.   Cardiovascular: Normal rate, regular rhythm. Grossly normal heart sounds.  Good peripheral circulation. Respiratory: Normal respiratory effort.  No retractions. Lungs CTAB. Gastrointestinal: Soft . Her abdominal tenderness especially to the left upper quadrant. There is mild guarding. There is no rebound. No distention or rigidity.  No CVA tenderness. Musculoskeletal: No lower extremity tenderness nor edema.  No joint effusions. Neurologic:  Normal speech and language. No gross focal neurologic deficits are appreciated. Skin:  Skin is warm, dry and intact. No rash noted. Psychiatric: Mood and affect are normal. Speech and behavior are normal.  ____________________________________________   LABS (all labs ordered are listed, but only abnormal results are displayed)  Labs Reviewed  COMPREHENSIVE METABOLIC PANEL - Abnormal; Notable for the following:    Potassium 2.9 (*)    Glucose, Bld 163 (*)    Total Protein 8.9 (*)    AST 14 (*)    ALT 13 (*)    All other components within normal limits  URINALYSIS COMPLETEWITH MICROSCOPIC ([facility] ONLY) - Abnormal; Notable for the following:    Color,  Urine YELLOW (*)    APPearance CLEAR (*)    Ketones, ur 1+ (*)    Hgb urine dipstick 2+ (*)    Protein, ur >500 (*)    Bacteria, UA RARE (*)    Squamous Epithelial / LPF 0-5 (*)    All other components within normal limits  CBC WITH DIFFERENTIAL/PLATELET - Abnormal; Notable for the following:    WBC 11.9 (*)    MCH 25.4 (*)    MCHC 31.7 (*)    RDW 17.7 (*)    Platelets 511 (*)    Neutro Abs 9.9 (*)    All other components within normal limits  TROPONIN I  LIPASE, BLOOD  CBC WITH DIFFERENTIAL/PLATELET   ____________________________________________  EKG  ED ECG REPORT I, Arelia Longest, the attending physician, personally viewed and interpreted this ECG.   Date: 09/27/2015  EKG Time: 2005  Rate: 71  Rhythm: normal sinus rhythm  Axis: Normal  Intervals:none  ST&T Change: No ST segment elevation or depression. No abnormal T-wave inversion.  ____________________________________________  RADIOLOGY   IMPRESSION: 1. No active disease in the chest . 2. Nonobstructive bowel gas pattern.   Electronically Signed By: Delbert Phenix M.D. On: 09/27/2015  21:33 ____________________________________________   PROCEDURES  ___________________________________________   INITIAL IMPRESSION / ASSESSMENT AND PLAN / ED COURSE  Pertinent labs & imaging results that were available during my care of the patient were reviewed by me and considered in my medical decision making (see chart for details).  ----------------------------------------- 1:15 AM on 09/28/2015 -----------------------------------------  Despite multiple rounds of pain and nausea meds the patient is still vomiting and still has abdominal pain. Her vomit is now bilious. She will be CAT scan. Will come in to the hospital, but pending CAT scan to determine if this is a surgical cause. Signed out to Dr. Derrill Kay. ____________________________________________   FINAL CLINICAL IMPRESSION(S) / ED  DIAGNOSES  Intractable nausea and vomiting with abdominal pain.    Myrna Blazer, MD 09/28/15 [phone]

## 2015-09-27 NOTE — ED Notes (Signed)
Pt presents via EMS w/ c/o vomiting and LUQ abdominal pain recurring today. Pt has recent past medical hx of gastritis for which she was treated x 2 days ago. Pt states she used meds rx'd for her n/v at home and has had several recent BM's, w/o resolution. Pt vomiting bile. Pt is hypertensive, unable to take her regular home meds due to vomiting and nausea.

## 2015-09-28 ENCOUNTER — Emergency Department: Payer: Medicare Other

## 2015-09-28 DIAGNOSIS — E876 Hypokalemia: Secondary | ICD-10-CM | POA: Diagnosis present

## 2015-09-28 DIAGNOSIS — K529 Noninfective gastroenteritis and colitis, unspecified: Secondary | ICD-10-CM | POA: Diagnosis present

## 2015-09-28 DIAGNOSIS — A084 Viral intestinal infection, unspecified: Secondary | ICD-10-CM | POA: Diagnosis not present

## 2015-09-28 LAB — BASIC METABOLIC PANEL
ANION GAP: 6 (ref 5–15)
BUN: 10 mg/dL (ref 6–20)
CO2: 25 mmol/L (ref 22–32)
Calcium: 8.2 mg/dL — ABNORMAL LOW (ref 8.9–10.3)
Chloride: 104 mmol/L (ref 101–111)
Creatinine, Ser: 0.77 mg/dL (ref 0.44–1.00)
Glucose, Bld: 107 mg/dL — ABNORMAL HIGH (ref 65–99)
POTASSIUM: 3.3 mmol/L — AB (ref 3.5–5.1)
SODIUM: 135 mmol/L (ref 135–145)

## 2015-09-28 LAB — MAGNESIUM: MAGNESIUM: 1.7 mg/dL (ref 1.7–2.4)

## 2015-09-28 MED ORDER — POTASSIUM CHLORIDE 10 MEQ/100ML IV SOLN
10.0000 meq | INTRAVENOUS | Status: AC
  Administered 2015-09-28 (×2): 10 meq via INTRAVENOUS
  Filled 2015-09-28 (×2): qty 100

## 2015-09-28 MED ORDER — ENOXAPARIN SODIUM 40 MG/0.4ML ~~LOC~~ SOLN: 40.0000 mg | SUBCUTANEOUS | Status: DC

## 2015-09-28 MED ORDER — TRAZODONE HCL 100 MG PO TABS
100.0000 mg | ORAL_TABLET | Freq: Every day | ORAL | Status: DC
  Administered 2015-09-28: 100 mg via ORAL
  Filled 2015-09-28: qty 1

## 2015-09-28 MED ORDER — SODIUM CHLORIDE 0.9 % IV SOLN
INTRAVENOUS | Status: DC
  Administered 2015-09-28: 11:00:00 via INTRAVENOUS

## 2015-09-28 MED ORDER — FLUOXETINE HCL 20 MG PO CAPS
20.0000 mg | ORAL_CAPSULE | Freq: Every day | ORAL | Status: DC
  Administered 2015-09-28 – 2015-09-29 (×2): 20 mg via ORAL
  Filled 2015-09-28 (×3): qty 1

## 2015-09-28 MED ORDER — POTASSIUM CHLORIDE CRYS ER 20 MEQ PO TBCR
40.0000 meq | EXTENDED_RELEASE_TABLET | Freq: Once | ORAL | Status: DC
  Filled 2015-09-28: qty 2

## 2015-09-28 MED ORDER — POTASSIUM CHLORIDE 10 MEQ/100ML IV SOLN
10.0000 meq | INTRAVENOUS | Status: DC
  Administered 2015-09-28 (×2): 10 meq via INTRAVENOUS
  Filled 2015-09-28 (×4): qty 100

## 2015-09-28 MED ORDER — SODIUM CHLORIDE 0.9% FLUSH
3.0000 mL | Freq: Two times a day (BID) | INTRAVENOUS | Status: DC
  Administered 2015-09-28 – 2015-09-29 (×2): 3 mL via INTRAVENOUS

## 2015-09-28 MED ORDER — IOPAMIDOL (ISOVUE-300) INJECTION 61%
125.0000 mL | Freq: Once | INTRAVENOUS | Status: AC | PRN
  Administered 2015-09-28: 125 mL via INTRAVENOUS

## 2015-09-28 MED ORDER — ALPRAZOLAM 0.5 MG PO TABS
2.0000 mg | ORAL_TABLET | Freq: Three times a day (TID) | ORAL | Status: DC | PRN
  Administered 2015-09-28 – 2015-09-29 (×4): 2 mg via ORAL
  Filled 2015-09-28: qty 2
  Filled 2015-09-28 (×2): qty 4
  Filled 2015-09-28: qty 2

## 2015-09-28 MED ORDER — METOPROLOL SUCCINATE ER 50 MG PO TB24
50.0000 mg | ORAL_TABLET | Freq: Every day | ORAL | Status: DC
  Administered 2015-09-28: 50 mg via ORAL
  Filled 2015-09-28: qty 1

## 2015-09-28 MED ORDER — POTASSIUM CHLORIDE CRYS ER 20 MEQ PO TBCR
40.0000 meq | EXTENDED_RELEASE_TABLET | Freq: Once | ORAL | Status: AC
  Administered 2015-09-28: 40 meq via ORAL

## 2015-09-28 MED ORDER — METOCLOPRAMIDE HCL 5 MG/ML IJ SOLN
INTRAMUSCULAR | Status: AC
  Administered 2015-09-28: 10 mg via INTRAVENOUS
  Filled 2015-09-28: qty 2

## 2015-09-28 MED ORDER — PANTOPRAZOLE SODIUM 40 MG PO TBEC: 40.0000 mg | DELAYED_RELEASE_TABLET | Freq: Two times a day (BID) | ORAL | Status: DC

## 2015-09-28 MED ORDER — OXYCODONE HCL 5 MG PO TABS
10.0000 mg | ORAL_TABLET | Freq: Four times a day (QID) | ORAL | Status: DC | PRN
  Administered 2015-09-28 – 2015-09-29 (×4): 10 mg via ORAL
  Filled 2015-09-28 (×4): qty 2

## 2015-09-28 MED ORDER — METOCLOPRAMIDE HCL 5 MG/ML IJ SOLN
10.0000 mg | Freq: Once | INTRAMUSCULAR | Status: AC
  Administered 2015-09-28: 10 mg via INTRAVENOUS
  Filled 2015-09-28: qty 2

## 2015-09-28 MED ORDER — PANTOPRAZOLE SODIUM 40 MG PO TBEC
40.0000 mg | DELAYED_RELEASE_TABLET | Freq: Two times a day (BID) | ORAL | Status: DC
  Administered 2015-09-28 – 2015-09-29 (×3): 40 mg via ORAL
  Filled 2015-09-28 (×3): qty 1

## 2015-09-28 MED ORDER — METOCLOPRAMIDE HCL 5 MG/ML IJ SOLN
10.0000 mg | Freq: Once | INTRAMUSCULAR | Status: AC
  Administered 2015-09-28: 10 mg via INTRAVENOUS

## 2015-09-28 MED ORDER — ENOXAPARIN SODIUM 40 MG/0.4ML ~~LOC~~ SOLN
40.0000 mg | Freq: Two times a day (BID) | SUBCUTANEOUS | Status: DC
  Administered 2015-09-28 – 2015-09-29 (×3): 40 mg via SUBCUTANEOUS
  Filled 2015-09-28 (×3): qty 0.4

## 2015-09-28 MED ORDER — LISINOPRIL 20 MG PO TABS
20.0000 mg | ORAL_TABLET | Freq: Every day | ORAL | Status: DC
  Administered 2015-09-28: 20 mg via ORAL
  Filled 2015-09-28: qty 1

## 2015-09-28 MED ORDER — ONDANSETRON HCL 4 MG PO TABS: 4.0000 mg | ORAL_TABLET | Freq: Four times a day (QID) | ORAL | Status: DC | PRN

## 2015-09-28 MED ORDER — LAMOTRIGINE 100 MG PO TABS
100.0000 mg | ORAL_TABLET | Freq: Every day | ORAL | Status: DC
  Administered 2015-09-28 – 2015-09-29 (×2): 100 mg via ORAL
  Filled 2015-09-28: qty 1
  Filled 2015-09-28: qty 4

## 2015-09-28 MED ORDER — SODIUM CHLORIDE 0.9 % IV BOLUS (SEPSIS)
1000.0000 mL | Freq: Once | INTRAVENOUS | Status: AC
  Administered 2015-09-28: 1000 mL via INTRAVENOUS

## 2015-09-28 MED ORDER — LABETALOL HCL 5 MG/ML IV SOLN
20.0000 mg | Freq: Once | INTRAVENOUS | Status: AC
  Administered 2015-09-28: 20 mg via INTRAVENOUS
  Filled 2015-09-28: qty 4

## 2015-09-28 MED ORDER — ZOLPIDEM TARTRATE 5 MG PO TABS: 5.0000 mg | ORAL_TABLET | Freq: Every evening | ORAL | Status: DC | PRN

## 2015-09-28 MED ORDER — POTASSIUM CHLORIDE IN NACL 20-0.9 MEQ/L-% IV SOLN
INTRAVENOUS | Status: DC
  Administered 2015-09-28: 09:00:00 via INTRAVENOUS
  Filled 2015-09-28 (×3): qty 1000

## 2015-09-28 MED ORDER — ONDANSETRON HCL 4 MG/2ML IJ SOLN: 4.0000 mg | Freq: Four times a day (QID) | INTRAMUSCULAR | Status: DC | PRN

## 2015-09-28 MED ORDER — HYDROMORPHONE HCL 1 MG/ML IJ SOLN
1.0000 mg | Freq: Once | INTRAMUSCULAR | Status: AC
  Administered 2015-09-28: 1 mg via INTRAVENOUS
  Filled 2015-09-28: qty 1

## 2015-09-28 MED ORDER — DIATRIZOATE MEGLUMINE & SODIUM 66-10 % PO SOLN
15.0000 mL | ORAL | Status: AC
  Administered 2015-09-28 (×2): 15 mL via ORAL
  Filled 2015-09-28: qty 30

## 2015-09-28 MED ORDER — MAGNESIUM SULFATE 2 GM/50ML IV SOLN
2.0000 g | Freq: Once | INTRAVENOUS | Status: AC
Start: 2015-09-28 — End: 2015-09-28
  Administered 2015-09-28: 2 g via INTRAVENOUS
  Filled 2015-09-28: qty 50

## 2015-09-28 NOTE — H&P (Signed)
[facility] [place] at [facility]   PATIENT NAME: Daisy [name]    MR#:  [id number]  DATE OF BIRTH:  1966/05/01  DATE OF ADMISSION:  09/27/2015  PRIMARY CARE PHYSICIAN: Mick Sell, MD   REQUESTING/REFERRING PHYSICIAN:   CHIEF COMPLAINT:   Chief Complaint  Patient presents with  . Emesis  . Abdominal Pain    HISTORY OF PRESENT ILLNESS: Daisy [name]  is a 50 y.o. female with a known history of hypertension, diabetes mellitus, GERD, anxiety disorder presented to the emergency room with nausea vomiting and abdominal discomfort for the last 4 days. Vomitus contained food particles and water. No history of any hematemesis or hemoptysis. Patient also had loose stool for the last 2 days. Was watery stool no blood in the stool. No history of recent travel. No history of sick contacts at home. No history of any chest pain. No history of any shortness of breath. No fever or chills or cough. Patient was worked up with a CT abdomen which showed no acute intra abdominal pathology.  PAST MEDICAL HISTORY:   Past Medical History  Diagnosis Date  . Diabetes mellitus without complication (HCC)   . Depression   . Hypertension   . Anxiety   . GERD (gastroesophageal reflux disease)   . Migraine   . Obesity     PAST SURGICAL HISTORY: Past Surgical History  Procedure Laterality Date  . Hernia repair    . Appendectomy    . Cholecystectomy    . Knee surgery    . Abdominal hysterectomy    . Application of wound vac      SOCIAL HISTORY:  Social History  Substance Use Topics  . Smoking status: Never Smoker   . Smokeless tobacco: Not on file  . Alcohol Use: 0.0 oz/week    0 Standard drinks or equivalent per week     Comment: occasional    FAMILY HISTORY:  Family History  Problem Relation Age of Onset  . Arthritis Mother   . Depression Mother   . Diabetes Mother   . Heart disease Mother   . Hyperlipidemia Mother   . Hypertension Mother   . Stroke  Mother   . Arthritis Father   . Diabetes Father   . Hearing loss Father   . Heart disease Father   . Hyperlipidemia Father   . Hypertension Father     DRUG ALLERGIES:  Allergies  Allergen Reactions  . Acetaminophen-Codeine Hives  . Codeine Hives  . Ibuprofen Nausea And Vomiting  . Morphine Nausea And Vomiting  . Propoxyphene Other (See Comments)    Reaction:  GI upset   . Sulfa Antibiotics Hives  . Duloxetine Rash and Other (See Comments)    Yellowing of the skin    REVIEW OF SYSTEMS:   CONSTITUTIONAL: No fever, fatigue or weakness.  EYES: No blurred or double vision.  EARS, NOSE, AND THROAT: No tinnitus or ear pain.  RESPIRATORY: No cough, shortness of breath, wheezing or hemoptysis.  CARDIOVASCULAR: No chest pain, orthopnea, edema.  GASTROINTESTINAL: Has nausea, vomiting, diarrhea and abdominal pain.  GENITOURINARY: No dysuria, hematuria.  ENDOCRINE: No polyuria, nocturia,  HEMATOLOGY: No anemia, easy bruising or bleeding SKIN: No rash or lesion. MUSCULOSKELETAL: No joint pain or arthritis.   NEUROLOGIC: No tingling, numbness, weakness.  PSYCHIATRY: No anxiety or depression.   MEDICATIONS AT HOME:  Prior to Admission medications   Medication Sig Start Date End Date Taking? Authorizing Provider  alprazolam Prudy Feeler) 2 MG tablet  Take 2 mg by mouth 3 (three) times daily as needed for anxiety.     Historical Provider, MD  diclofenac sodium (VOLTAREN) 1 % GEL Apply 2-4 g to painful areas of the skin 4 times per day if tolerated. ONLY APPLY A MAXIMUM OF 4 GRAMS PER APPLICATION 09/18/15   Ewing Schlein, MD  FLUoxetine (PROZAC) 20 MG capsule Take 20 mg by mouth daily.    Historical Provider, MD  furosemide (LASIX) 20 MG tablet Take 20 mg by mouth daily as needed.     Historical Provider, MD  furosemide (LASIX) 20 MG tablet Take 20 mg by mouth.    Historical Provider, MD  lamoTRIgine (LAMICTAL) 100 MG tablet Take 100 mg by mouth daily.    Historical Provider, MD  lisinopril  (PRINIVIL,ZESTRIL) 20 MG tablet Take 20 mg by mouth daily.    Historical Provider, MD  metFORMIN (GLUCOPHAGE) 500 MG tablet Take 500 mg by mouth daily.    Historical Provider, MD  metoprolol succinate (TOPROL-XL) 25 MG 24 hr tablet Take 50 mg by mouth daily.    Historical Provider, MD  nortriptyline (PAMELOR) 25 MG capsule Take 25 mg by mouth 2 (two) times daily as needed (for anxiety). Reported on 09/18/2015    Historical Provider, MD  ondansetron (ZOFRAN) 4 MG tablet Take 1 tablet (4 mg total) by mouth every 8 (eight) hours as needed for nausea or vomiting. 09/25/15   Jennye Moccasin, MD  Oxycodone HCl 10 MG TABS Limit for 4 - 8 tabs by mouth per day if tolerated 09/18/15   Ewing Schlein, MD  pantoprazole (PROTONIX) 40 MG tablet Take 40 mg by mouth 2 (two) times daily.     Historical Provider, MD  polyethylene glycol (MIRALAX / GLYCOLAX) packet Take 17 g by mouth daily as needed for mild constipation. 05/09/15   Delfino Lovett, MD  potassium chloride (KCL) 2 mEq/mL SOLN oral liquid Take by mouth 2 (two) times daily as needed (takes every other day with lasix).    Historical Provider, MD  promethazine (PHENERGAN) 12.5 MG suppository Place 1 suppository (12.5 mg total) rectally every 6 (six) hours as needed for nausea or vomiting. 09/25/15   Jennye Moccasin, MD  promethazine (PHENERGAN) 12.5 MG tablet Take 1 tablet (12.5 mg total) by mouth every 6 (six) hours as needed for nausea or vomiting. 09/25/15   Jennye Moccasin, MD  sucralfate (CARAFATE) 1 G tablet Take 1 tablet (1 g total) by mouth 4 (four) times daily. 05/09/15   Delfino Lovett, MD  tiZANidine (ZANAFLEX) 4 MG tablet Limit 1-3 tabs by mouth per day if tolerated 09/18/15   Ewing Schlein, MD  traZODone (DESYREL) 100 MG tablet Take 100 mg by mouth at bedtime.    Historical Provider, MD  zolpidem (AMBIEN CR) 12.5 MG CR tablet Take 12.5 mg by mouth at bedtime as needed for sleep.    Historical Provider, MD      PHYSICAL EXAMINATION:   VITAL SIGNS: Blood  pressure 119/83, pulse 75, temperature 98.8 F (37.1 C), temperature source Oral, resp. rate 18, height  (1.651 m), weight 131.543 kg (290 lb), SpO2 98 %.  GENERAL:  50 y.o.-year-old patient lying in the bed with no acute distress.  EYES: Pupils equal, round, reactive to light and accommodation. No scleral icterus. Extraocular muscles intact.  HEENT: Head atraumatic, normocephalic. Oropharynx and nasopharynx clear.  NECK:  Supple, no jugular venous distention. No thyroid enlargement, no tenderness.  LUNGS: Normal breath sounds bilaterally, no wheezing, rales,rhonchi  or crepitation. No use of accessory muscles of respiration.  CARDIOVASCULAR: S1, S2 normal. No murmurs, rubs, or gallops.  ABDOMEN: Soft, mild tenderness around umbilicus, nondistended. Bowel sounds present. No organomegaly or mass.  EXTREMITIES: No pedal edema, cyanosis, or clubbing.  NEUROLOGIC: Cranial nerves II through XII are intact. Muscle strength 5/5 in all extremities. Sensation intact. Gait not checked.  PSYCHIATRIC: The patient is alert and oriented x 3.  SKIN: No obvious rash, lesion, or ulcer.   LABORATORY PANEL:   CBC  Recent Labs Lab 09/25/15 1640 09/27/15 2102  WBC 11.0 11.9*  HGB 13.0 12.9  HCT 40.0 40.9  PLT 496* 511*  MCV 79.6* 80.1  MCH 25.9* 25.4*  MCHC 32.5 31.7*  RDW 18.3* 17.7*  LYMPHSABS  --  1.5  MONOABS  --  0.4  EOSABS  --  0.1  BASOSABS  --  0.1   ------------------------------------------------------------------------------------------------------------------  Chemistries   Recent Labs Lab 09/25/15 1640 09/27/15 2020  NA 138 137  K 3.4* 2.9*  CL 103 102  CO2 21* 25  GLUCOSE 147* 163*  BUN 10 13  CREATININE 0.88 0.83  CALCIUM 9.5 9.1  AST 16 14*  ALT 13* 13*  ALKPHOS 77 70  BILITOT 0.7 1.0   ------------------------------------------------------------------------------------------------------------------ estimated creatinine clearance is 114.8 mL/min (by C-G  formula based on Cr of 0.83). ------------------------------------------------------------------------------------------------------------------ No results for input(s): TSH, T4TOTAL, T3FREE, THYROIDAB in the last 72 hours.  Invalid input(s): FREET3   Coagulation profile No results for input(s): INR, PROTIME in the last 168 hours. ------------------------------------------------------------------------------------------------------------------- No results for input(s): DDIMER in the last 72 hours. -------------------------------------------------------------------------------------------------------------------  Cardiac Enzymes  Recent Labs Lab 09/25/15 1640 09/27/15 2020  TROPONINI <0.03 <0.03   ------------------------------------------------------------------------------------------------------------------ Invalid input(s): POCBNP  ---------------------------------------------------------------------------------------------------------------  Urinalysis    Component Value Date/Time   COLORURINE YELLOW* 09/27/2015 2041   COLORURINE YELLOW 12/10/2013 0745   APPEARANCEUR CLEAR* 09/27/2015 2041   APPEARANCEUR CLEAR 12/10/2013 0745   LABSPEC 1.017 09/27/2015 2041   LABSPEC 1.015 12/10/2013 0745   PHURINE 6.0 09/27/2015 2041   PHURINE 8.0 12/10/2013 0745   GLUCOSEU NEGATIVE 09/27/2015 2041   GLUCOSEU NEGATIVE 12/10/2013 0745   HGBUR 2+* 09/27/2015 2041   HGBUR NEGATIVE 12/10/2013 0745   BILIRUBINUR NEGATIVE 09/27/2015 2041   BILIRUBINUR NEGATIVE 12/10/2013 0745   KETONESUR 1+* 09/27/2015 2041   KETONESUR NEGATIVE 12/10/2013 0745   PROTEINUR >500* 09/27/2015 2041   PROTEINUR 30 mg/dL 40/98/119106/29/2015 [id number]   NITRITE NEGATIVE 09/27/2015 2041   NITRITE NEGATIVE 12/10/2013 0745   LEUKOCYTESUR NEGATIVE 09/27/2015 2041   LEUKOCYTESUR NEGATIVE 12/10/2013 0745     RADIOLOGY: Ct Abdomen Pelvis W Contrast  09/28/2015  CLINICAL DATA:  Acute onset of vomiting and left upper quadrant  abdominal pain. Initial encounter. EXAM: CT ABDOMEN AND PELVIS WITH CONTRAST TECHNIQUE: Multidetector CT imaging of the abdomen and pelvis was performed using the standard protocol following bolus administration of intravenous contrast. CONTRAST:  125mL ISOVUE-300 IOPAMIDOL (ISOVUE-300) INJECTION 61% COMPARISON:  CT of the abdomen and pelvis from 09/25/2015 FINDINGS: The visualized lung bases are clear. A tiny hiatal hernia is better characterized on the prior study. There is prominence of the intrahepatic biliary ducts, within normal limits status post cholecystectomy. The liver and spleen are otherwise unremarkable. The pancreas and adrenal glands are unremarkable. The kidneys are unremarkable in appearance. There is no evidence of hydronephrosis. No renal or ureteral stones are seen. No perinephric stranding is appreciated. No free fluid is identified. The small bowel is unremarkable in appearance. The stomach is  otherwise within normal limits. No acute vascular abnormalities are seen. Postoperative soft tissue inflammation is noted at the right lower quadrant, reflecting recent hernia repair. This is similar in appearance to the prior study. The patient is status post appendectomy. The colon is decompressed and grossly unremarkable in appearance. The bladder is mildly distended and grossly unremarkable. The patient is status post hysterectomy. The left ovary is unremarkable in appearance. No suspicious adnexal masses are seen. No inguinal lymphadenopathy is seen. No acute osseous abnormalities are identified. Mild facet disease is noted at the lower lumbar spine. IMPRESSION: 1. No acute abnormality seen within the abdomen or pelvis. 2. Postoperative soft tissue inflammation at the right lower quadrant is similar in appearance to the recent prior study. 3. Tiny hiatal hernia is better characterized on the prior study. Electronically Signed   By: Roanna Raider M.D.   On: 09/28/2015 02:45   Dg Abd Acute  W/chest  09/27/2015  CLINICAL DATA:  Left upper abdominal pain and vomiting. EXAM: DG ABDOMEN ACUTE W/ 1V CHEST COMPARISON:  09/25/2015 chest and abdominal radiographs. FINDINGS: Stable cardiomediastinal silhouette with top normal heart size. No pneumothorax. No pleural effusion. Lungs appear clear, with no acute consolidative airspace disease and no pulmonary edema. Cholecystectomy clips in the right upper quadrant of the abdomen. No dilated small bowel loops or air-fluid levels. Physiologic stool in the colon. No evidence of pneumatosis, pneumoperitoneum or pathologic soft tissue calcifications. Calcifications to the right of the lower lumbar spine represent right ovarian vein calcifications as seen on recent CT. Degenerative changes in the lower lumbar spine. IMPRESSION: 1. No active disease in the chest . 2. Nonobstructive bowel gas pattern. Electronically Signed   By: Delbert Phenix M.D.   On: 09/27/2015 21:33    EKG: Orders placed or performed during the hospital encounter of 09/27/15  . ED EKG  . ED EKG  . EKG 12-Lead  . EKG 12-Lead    IMPRESSION AND PLAN: 50 year old female patient with history of hypertension diabetes, anxiety disorder presented to the emergency room with abdominal discomfort nausea and vomiting. Admitting diagnosis 1. Hypokalemia 2. Intractable nausea and vomiting 3. Abdominal pain 4. Viral gastroenteritis Treatment plan It patient to telemetry Potassium supplementation IV fluid hydration Antiemetics Supportive care.  All the records are reviewed and case discussed with ED provider. Management plans discussed with the patient, family and they are in agreement.  CODE STATUS:FULL Code Status History    Date Active Date Inactive Code Status Order ID Comments User Context   05/05/2015  8:36 PM 05/09/2015  8:06 PM Full Code [id number]  Wyatt Haste, MD ED   01/07/2015  7:11 PM 01/09/2015  6:55 PM Full Code [id number]  Milagros Loll, MD ED       TOTAL TIME TAKING  CARE OF THIS PATIENT: 50 minutes.    Ihor Austin M.D on 09/28/2015 at 4:06 AM  Between 7am to 6pm - Pager - [phone]  After 6pm go to [email or web address] - password [email or web address] [facility]  [facility]  Office  [phone]  CC: Primary care physician; Mick Sell, MD

## 2015-09-28 NOTE — Care Management Obs Status (Signed)
MEDICARE OBSERVATION STATUS NOTIFICATION   Patient Details  Name: Daisy GoslingDanita A Goodlow MRN: [id number] Date of Birth: 1966/02/04   Medicare Observation Status Notification Given:  Yes    Caren MacadamMichelle Sharada Albornoz, RN 09/28/2015, 5:49 PM

## 2015-09-28 NOTE — Progress Notes (Signed)
Pt. Arrived to unit via stretcher, she walked from stretcher to bed. Tele applied, NSR, second verifier ConsecoYasmine, Charity fundraiserN. Pt, used bathroom and then requested some apple juice, which was provided and something for anxiety which was also provided. Skin assessment - skin warm and dry with no issues noted. Second Artistverifier Yasmine. Pt given general room orientation and instruction on how to use call bell and ascom. Pt. Alert and orient with no c/o pain of pain, nausea or vomiting. No acute distress noted. No SOB. 4th run of potassium hanging. Will continue to monitor pt.

## 2015-09-28 NOTE — Care Management Important Message (Signed)
Important Message  Patient Details  Name: Daisy [name] MRN: [id number] Date of Birth: [birth year]   Medicare Important Message Given:  Yes    Cindia Hustead A, RN 09/28/2015, 3:21 PM

## 2015-09-28 NOTE — Progress Notes (Signed)
[facility] [place] [facility]   PATIENT NAME: Daisy MangesDanita [name]    MR#:  [id number]  DATE OF BIRTH:  1965/07/02  SUBJECTIVE:  CHIEF COMPLAINT:   Chief Complaint  Patient presents with  . Emesis  . Abdominal Pain  No abdominal pain vomiting or diarrhea, but has mild nausea.  REVIEW OF SYSTEMS:  CONSTITUTIONAL: No fever, fatigue or weakness.  EYES: No blurred or double vision.  EARS, NOSE, AND THROAT: No tinnitus or ear pain.  RESPIRATORY: No cough, shortness of breath, wheezing or hemoptysis.  CARDIOVASCULAR: No chest pain, orthopnea, edema.  GASTROINTESTINAL: has nausea, no vomiting, diarrhea or abdominal pain.  GENITOURINARY: No dysuria, hematuria.  ENDOCRINE: No polyuria, nocturia,  HEMATOLOGY: No anemia, easy bruising or bleeding SKIN: No rash or lesion. MUSCULOSKELETAL: No joint pain or arthritis.   NEUROLOGIC: No tingling, numbness, weakness.  PSYCHIATRY: No anxiety or depression.   DRUG ALLERGIES:   Allergies  Allergen Reactions  . Acetaminophen-Codeine Hives  . Codeine Hives  . Ibuprofen Nausea And Vomiting  . Morphine Nausea And Vomiting  . Propoxyphene Other (See Comments)    Reaction:  GI upset   . Sulfa Antibiotics Hives  . Duloxetine Rash and Other (See Comments)    Yellowing of the skin    VITALS:  Blood pressure 125/62, pulse 77, temperature 99.1 F (37.3 C), temperature source Oral, resp. rate 21, height 5\' 5"  (1.651 m), weight 121.383 kg (267 lb 9.6 oz), SpO2 100 %.  PHYSICAL EXAMINATION:  GENERAL:  [age] y.o.-year-old patient lying in the bed with no acute distress.  EYES: Pupils equal, round, reactive to light and accommodation. No scleral icterus. Extraocular muscles intact.  HEENT: Head atraumatic, normocephalic. Oropharynx and nasopharynx clear. Moist oral mucosa. NECK:  Supple, no jugular venous distention. No thyroid enlargement, no tenderness.  LUNGS: Normal breath sounds bilaterally, no wheezing, rales,rhonchi or  crepitation. No use of accessory muscles of respiration.  CARDIOVASCULAR: S1, S2 normal. No murmurs, rubs, or gallops.  ABDOMEN: Soft, nontender, nondistended. Bowel sounds present. No organomegaly or mass.  EXTREMITIES: No pedal edema, cyanosis, or clubbing.  NEUROLOGIC: Cranial nerves II through XII are intact. Muscle strength 5/5 in all extremities. Sensation intact. Gait not checked.  PSYCHIATRIC: The patient is alert and oriented x 3.  SKIN: No obvious rash, lesion, or ulcer.    LABORATORY PANEL:   CBC  Recent Labs Lab 09/27/15 2102  WBC 11.9*  HGB 12.9  HCT 40.9  PLT 511*   ------------------------------------------------------------------------------------------------------------------  Chemistries   Recent Labs Lab 09/27/15 2020 09/28/15 1114  NA 137 135  K 2.9* 3.3*  CL 102 104  CO2 25 25  GLUCOSE 163* 107*  BUN 13 10  CREATININE 0.83 0.77  CALCIUM 9.1 8.2*  MG  --  1.7  AST 14*  --   ALT 13*  --   ALKPHOS 70  --   BILITOT 1.0  --    ------------------------------------------------------------------------------------------------------------------  Cardiac Enzymes  Recent Labs Lab 09/27/15 2020  TROPONINI <0.03   ------------------------------------------------------------------------------------------------------------------  RADIOLOGY:  Ct Abdomen Pelvis W Contrast  09/28/2015  CLINICAL DATA:  Acute onset of vomiting and left upper quadrant abdominal pain. Initial encounter. EXAM: CT ABDOMEN AND PELVIS WITH CONTRAST TECHNIQUE: Multidetector CT imaging of the abdomen and pelvis was performed using the standard protocol following bolus administration of intravenous contrast. CONTRAST:  125mL ISOVUE-300 IOPAMIDOL (ISOVUE-300) INJECTION 61% COMPARISON:  CT of the abdomen and pelvis from 09/25/2015 FINDINGS: The visualized lung bases are clear. A tiny  hiatal hernia is better characterized on the prior study. There is prominence of the intrahepatic biliary  ducts, within normal limits status post cholecystectomy. The liver and spleen are otherwise unremarkable. The pancreas and adrenal glands are unremarkable. The kidneys are unremarkable in appearance. There is no evidence of hydronephrosis. No renal or ureteral stones are seen. No perinephric stranding is appreciated. No free fluid is identified. The small bowel is unremarkable in appearance. The stomach is otherwise within normal limits. No acute vascular abnormalities are seen. Postoperative soft tissue inflammation is noted at the right lower quadrant, reflecting recent hernia repair. This is similar in appearance to the prior study. The patient is status post appendectomy. The colon is decompressed and grossly unremarkable in appearance. The bladder is mildly distended and grossly unremarkable. The patient is status post hysterectomy. The left ovary is unremarkable in appearance. No suspicious adnexal masses are seen. No inguinal lymphadenopathy is seen. No acute osseous abnormalities are identified. Mild facet disease is noted at the lower lumbar spine. IMPRESSION: 1. No acute abnormality seen within the abdomen or pelvis. 2. Postoperative soft tissue inflammation at the right lower quadrant is similar in appearance to the recent prior study. 3. Tiny hiatal hernia is better characterized on the prior study. Electronically Signed   By: Roanna Raider M.D.   On: 09/28/2015 02:45   Dg Abd Acute W/chest  09/27/2015  CLINICAL DATA:  Left upper abdominal pain and vomiting. EXAM: DG ABDOMEN ACUTE W/ 1V CHEST COMPARISON:  09/25/2015 chest and abdominal radiographs. FINDINGS: Stable cardiomediastinal silhouette with top normal heart size. No pneumothorax. No pleural effusion. Lungs appear clear, with no acute consolidative airspace disease and no pulmonary edema. Cholecystectomy clips in the right upper quadrant of the abdomen. No dilated small bowel loops or air-fluid levels. Physiologic stool in the colon. No  evidence of pneumatosis, pneumoperitoneum or pathologic soft tissue calcifications. Calcifications to the right of the lower lumbar spine represent right ovarian vein calcifications as seen on recent CT. Degenerative changes in the lower lumbar spine. IMPRESSION: 1. No active disease in the chest . 2. Nonobstructive bowel gas pattern. Electronically Signed   By: Delbert Phenix M.D.   On: 09/27/2015 21:33    EKG:   Orders placed or performed during the hospital encounter of 09/27/15  . ED EKG  . ED EKG  . EKG 12-Lead  . EKG 12-Lead    ASSESSMENT AND PLAN:   50 year old female patient with history of hypertension diabetes, anxiety disorder presented to the emergency room with abdominal discomfort nausea and vomiting.   1. Hypokalemia, Continue potassium and follow-up BMP. 2. Viral gastroenteritis with abdominal pain, nausea and vomiting. Better, continue Zofran when necessary and IV fluid support. 3.  diabetes. Start sliding scale. 4. Hypertension. Controlled.    All the records are reviewed and case discussed with Care Management/Social Workerr. Management plans discussed with the patient, family and they are in agreement.  CODE STATUS: Full code  TOTAL TIME TAKING CARE OF THIS PATIENT: 33 minutes.  Greater than 50% time was spent on coordination of care and face-to-face counseling.  POSSIBLE D/C IN 1 DAYS, DEPENDING ON CLINICAL CONDITION.   Shaune Pollack M.D on 09/28/2015 at 1:52 PM  Between 7am to 6pm - Pager - [phone]  After 6pm go to [email or web address] - password [email or web address] [facility]  [place] [facility]  Office  [phone]  CC: Primary care physician; Mick Sell, MD

## 2015-09-28 NOTE — Progress Notes (Signed)
Report given to 1A nurse. Patient currently resting comfortably, medicated for pain. No other complaints. Will be transferred shortly. Trudee KusterBrandi R Mansfield

## 2015-09-29 DIAGNOSIS — A084 Viral intestinal infection, unspecified: Secondary | ICD-10-CM | POA: Diagnosis not present

## 2015-09-29 LAB — BASIC METABOLIC PANEL
ANION GAP: 4 — AB (ref 5–15)
BUN: 11 mg/dL (ref 6–20)
CHLORIDE: 104 mmol/L (ref 101–111)
CO2: 26 mmol/L (ref 22–32)
Calcium: 8.2 mg/dL — ABNORMAL LOW (ref 8.9–10.3)
Creatinine, Ser: 0.89 mg/dL (ref 0.44–1.00)
GFR calc non Af Amer: >60 mL/min (ref >60)
Glucose, Bld: 131 mg/dL — ABNORMAL HIGH (ref 65–99)
Potassium: 3.8 mmol/L (ref 3.5–5.1)
Sodium: 134 mmol/L — ABNORMAL LOW (ref 135–145)

## 2015-09-29 NOTE — Discharge Summary (Signed)
[facility] - [place] at [facility]   PATIENT NAME: Daisy [name]    MR#:  [id number]  DATE OF BIRTH:  11-Jul-1965  DATE OF ADMISSION:  09/27/2015 ADMITTING PHYSICIAN: Ihor Austin, MD  DATE OF DISCHARGE:  09/29/2015  PRIMARY CARE PHYSICIAN: Mick Sell, MD    ADMISSION DIAGNOSIS:  Pain of upper abdomen [R10.10] Intractable vomiting with nausea, vomiting of unspecified type [R11.10]   DISCHARGE DIAGNOSIS:  Viral gastroenteritis Hypokalemia SECONDARY DIAGNOSIS:   Past Medical History  Diagnosis Date  . Diabetes mellitus without complication (HCC)   . Depression   . Hypertension   . Anxiety   . GERD (gastroesophageal reflux disease)   . Migraine   . Obesity     HOSPITAL COURSE:   1. Hypokalemia, improved with potassium. 2. Viral gastroenteritis with abdominal pain, nausea and vomiting. Improved with Zofran when necessary and IV fluid support. 3. diabetes. Onsliding scale. 4. Hypertension. Controlled. Lopressor, lisinopril and lasix are stopped due to low BP.  DISCHARGE CONDITIONS:   Stable, discharge to home today.  CONSULTS OBTAINED:     DRUG ALLERGIES:   Allergies  Allergen Reactions  . Acetaminophen-Codeine Hives  . Codeine Hives  . Ibuprofen Nausea And Vomiting  . Morphine Nausea And Vomiting  . Propoxyphene Other (See Comments)    Reaction:  GI upset   . Sulfa Antibiotics Hives  . Duloxetine Rash and Other (See Comments)    Yellowing of the skin    DISCHARGE MEDICATIONS:   Current Discharge Medication List    CONTINUE these medications which have NOT CHANGED   Details  alprazolam (XANAX) 2 MG tablet Take 2 mg by mouth 3 (three) times daily as needed for anxiety.     diclofenac sodium (VOLTAREN) 1 % GEL Apply 2-4 g to painful areas of the skin 4 times per day if tolerated. ONLY APPLY A MAXIMUM OF 4 GRAMS PER APPLICATION Qty: 500 g, Refills: 0    FLUoxetine (PROZAC) 20 MG capsule Take 20 mg by mouth daily.     lamoTRIgine (LAMICTAL) 100 MG tablet Take 100 mg by mouth daily.    metFORMIN (GLUCOPHAGE) 500 MG tablet Take 500 mg by mouth daily.    nortriptyline (PAMELOR) 25 MG capsule Take 25 mg by mouth 2 (two) times daily as needed (for anxiety). Reported on 09/18/2015    ondansetron (ZOFRAN) 4 MG tablet Take 1 tablet (4 mg total) by mouth every 8 (eight) hours as needed for nausea or vomiting. Qty: 21 tablet, Refills: 0    Oxycodone HCl 10 MG TABS Limit for 4 - 8 tabs by mouth per day if tolerated Qty: 240 tablet, Refills: 0    pantoprazole (PROTONIX) 40 MG tablet Take 40 mg by mouth 2 (two) times daily.     polyethylene glycol (MIRALAX / GLYCOLAX) packet Take 17 g by mouth daily as needed for mild constipation. Qty: 14 each, Refills: 0    potassium chloride (KCL) 2 mEq/mL SOLN oral liquid Take by mouth 2 (two) times daily as needed (takes every other day with lasix).    promethazine (PHENERGAN) 12.5 MG suppository Place 1 suppository (12.5 mg total) rectally every 6 (six) hours as needed for nausea or vomiting. Qty: 12 each, Refills: 0    promethazine (PHENERGAN) 12.5 MG tablet Take 1 tablet (12.5 mg total) by mouth every 6 (six) hours as needed for nausea or vomiting. Qty: 30 tablet, Refills: 0    sucralfate (CARAFATE) 1 G tablet Take 1 tablet (1 g total)  by mouth 4 (four) times daily. Qty: 30 tablet, Refills: 0    tiZANidine (ZANAFLEX) 4 MG tablet Limit 1-3 tabs by mouth per day if tolerated Qty: 80 tablet, Refills: 0    traZODone (DESYREL) 100 MG tablet Take 100 mg by mouth at bedtime.    zolpidem (AMBIEN CR) 12.5 MG CR tablet Take 12.5 mg by mouth at bedtime as needed for sleep.      STOP taking these medications     furosemide (LASIX) 20 MG tablet      furosemide (LASIX) 20 MG tablet      lisinopril (PRINIVIL,ZESTRIL) 20 MG tablet      metoprolol succinate (TOPROL-XL) 25 MG 24 hr tablet          DISCHARGE INSTRUCTIONS:   If you experience worsening of your admission  symptoms, develop shortness of breath, life threatening emergency, suicidal or homicidal thoughts you must seek medical attention immediately by calling 911 or calling your MD immediately  if symptoms less severe.  You Must read complete instructions/literature along with all the possible adverse reactions/side effects for all the Medicines you take and that have been prescribed to you. Take any new Medicines after you have completely understood and accept all the possible adverse reactions/side effects.   Please note  You were cared for by a hospitalist during your hospital stay. If you have any questions about your discharge medications or the care you received while you were in the hospital after you are discharged, you can call the unit and asked to speak with the hospitalist on call if the hospitalist that took care of you is not available. Once you are discharged, your primary care physician will handle any further medical issues. Please note that NO REFILLS for any discharge medications will be authorized once you are discharged, as it is imperative that you return to your primary care physician (or establish a relationship with a primary care physician if you do not have one) for your aftercare needs so that they can reassess your need for medications and monitor your lab values.    Today   SUBJECTIVE   No complaint.   VITAL SIGNS:  Blood pressure 109/72, pulse 82, temperature 97.5 F (36.4 C), temperature source Oral, resp. rate 18, height 5\' 5"  (1.651 m), weight 121.383 kg (267 lb 9.6 oz), SpO2 99 %.  I/O:   Intake/Output Summary (Last 24 hours) at 09/29/15 1318 Last data filed at 09/29/15 0900  Gross per 24 hour  Intake    720 ml  Output      0 ml  Net    720 ml    PHYSICAL EXAMINATION:  GENERAL:  50 y.o.-year-old patient lying in the bed with no acute distress. Morbid obese. EYES: Pupils equal, round, reactive to light and accommodation. No scleral icterus. Extraocular  muscles intact.  HEENT: Head atraumatic, normocephalic. Oropharynx and nasopharynx clear.  NECK:  Supple, no jugular venous distention. No thyroid enlargement, no tenderness.  LUNGS: Normal breath sounds bilaterally, no wheezing, rales,rhonchi or crepitation. No use of accessory muscles of respiration.  CARDIOVASCULAR: S1, S2 normal. No murmurs, rubs, or gallops.  ABDOMEN: Soft, non-tender, non-distended. Bowel sounds present. No organomegaly or mass.  EXTREMITIES: No pedal edema, cyanosis, or clubbing.  NEUROLOGIC: Cranial nerves II through XII are intact. Muscle strength 5/5 in all extremities. Sensation intact. Gait not checked.  PSYCHIATRIC: The patient is alert and oriented x 3.  SKIN: No obvious rash, lesion, or ulcer.   DATA REVIEW:  CBC  Recent Labs Lab 09/27/15 2102  WBC 11.9*  HGB 12.9  HCT 40.9  PLT 511*    Chemistries   Recent Labs Lab 09/27/15 2020 09/28/15 1114 09/29/15 0517  NA 137 135 134*  K 2.9* 3.3* 3.8  CL 102 104 104  CO2 GLUCOSE 163* 107* 131*  BUN CREATININE 0.83 0.77 0.89  CALCIUM 9.1 8.2* 8.2*  MG  --  1.7  --   AST 14*  --   --   ALT 13*  --   --   ALKPHOS 70  --   --   BILITOT 1.0  --   --     Cardiac Enzymes  Recent Labs Lab 09/27/15 2020  TROPONINI <0.03    Microbiology Results  Results for orders placed or performed in visit on 12/11/13  Culture, blood (single)     Status: None   Collection Time: 12/10/13  7:46 AM  Result Value Ref Range Status   Micro Text Report   Final       SOURCE: first set right hand    COMMENT                   NO GROWTH AEROBICALLY/ANAEROBICALLY IN 5 DAYS   ANTIBIOTIC                                                      Culture, blood (single)     Status: None   Collection Time: 12/10/13  8:00 AM  Result Value Ref Range Status   Micro Text Report   Final       SOURCE: second set left hand1    COMMENT                   NO GROWTH AEROBICALLY/ANAEROBICALLY IN 5 DAYS    ANTIBIOTIC                                                        RADIOLOGY:  Ct Abdomen Pelvis W Contrast  09/28/2015  CLINICAL DATA:  Acute onset of vomiting and left upper quadrant abdominal pain. Initial encounter. EXAM: CT ABDOMEN AND PELVIS WITH CONTRAST TECHNIQUE: Multidetector CT imaging of the abdomen and pelvis was performed using the standard protocol following bolus administration of intravenous contrast. CONTRAST:  ISOVUE-300 IOPAMIDOL (ISOVUE-300) INJECTION 61% COMPARISON:  CT of the abdomen and pelvis from 09/25/2015 FINDINGS: The visualized lung bases are clear. A tiny hiatal hernia is better characterized on the prior study. There is prominence of the intrahepatic biliary ducts, within normal limits status post cholecystectomy. The liver and spleen are otherwise unremarkable. The pancreas and adrenal glands are unremarkable. The kidneys are unremarkable in appearance. There is no evidence of hydronephrosis. No renal or ureteral stones are seen. No perinephric stranding is appreciated. No free fluid is identified. The small bowel is unremarkable in appearance. The stomach is otherwise within normal limits. No acute vascular abnormalities are seen. Postoperative soft tissue inflammation is noted at the right lower quadrant, reflecting recent hernia repair. This is similar in appearance to the prior study. The patient is status post  appendectomy. The colon is decompressed and grossly unremarkable in appearance. The bladder is mildly distended and grossly unremarkable. The patient is status post hysterectomy. The left ovary is unremarkable in appearance. No suspicious adnexal masses are seen. No inguinal lymphadenopathy is seen. No acute osseous abnormalities are identified. Mild facet disease is noted at the lower lumbar spine. IMPRESSION: 1. No acute abnormality seen within the abdomen or pelvis. 2. Postoperative soft tissue inflammation at the right lower quadrant is similar in  appearance to the recent prior study. 3. Tiny hiatal hernia is better characterized on the prior study. Electronically Signed   By: Roanna RaiderJeffery  Chang M.D.   On: 09/28/2015 02:45   Dg Abd Acute W/chest  09/27/2015  CLINICAL DATA:  Left upper abdominal pain and vomiting. EXAM: DG ABDOMEN ACUTE W/ 1V CHEST COMPARISON:  09/25/2015 chest and abdominal radiographs. FINDINGS: Stable cardiomediastinal silhouette with top normal heart size. No pneumothorax. No pleural effusion. Lungs appear clear, with no acute consolidative airspace disease and no pulmonary edema. Cholecystectomy clips in the right upper quadrant of the abdomen. No dilated small bowel loops or air-fluid levels. Physiologic stool in the colon. No evidence of pneumatosis, pneumoperitoneum or pathologic soft tissue calcifications. Calcifications to the right of the lower lumbar spine represent right ovarian vein calcifications as seen on recent CT. Degenerative changes in the lower lumbar spine. IMPRESSION: 1. No active disease in the chest . 2. Nonobstructive bowel gas pattern. Electronically Signed   By: Delbert PhenixJason A Poff M.D.   On: 09/27/2015 21:33        Management plans discussed with the patient, family and they are in agreement.  CODE STATUS:     Code Status Orders        Start     Ordered   09/28/15 0455  Full code   Continuous     09/28/15 0454    Code Status History    Date Active Date Inactive Code Status Order ID Comments User Context   05/05/2015  8:36 PM 05/09/2015  8:06 PM Full Code [id number]  Wyatt Hasteavid K Hower, MD ED   01/07/2015  7:11 PM 01/09/2015  6:55 PM Full Code [id number]  Milagros LollSrikar Sudini, MD ED      TOTAL TIME TAKING CARE OF THIS PATIENT: 25 minutes.    Shaune Pollackhen, Vanilla Heatherington M.D on 09/29/2015 at 1:18 PM  Between 7am to 6pm - Pager - [phone]  After 6pm go to [email or web address] - password [email or web address] [email or web address]  [facility]  Office  [phone]  CC: Primary care physician; Mick SellFITZGERALD, DAVID P, MD

## 2015-09-29 NOTE — Discharge Instructions (Signed)
Heart healthy and ADA diet. °Activity as tolerated. °

## 2015-09-29 NOTE — Progress Notes (Signed)
Discharge: Pt d/c from room via wheelchair, Family member with the pt. Discharge instructions given to the patient and family members.  No questions from pt, reintegrated to the pt to call or go to the ED for chest discomfort. Pt dressed in street clothes and left with discharge papers in hand. IV d/ced and no complaints of pain or discomfort.

## 2015-10-14 ENCOUNTER — Ambulatory Visit: Payer: Medicare Other | Attending: Pain Medicine | Admitting: Pain Medicine

## 2015-10-14 ENCOUNTER — Encounter: Payer: Self-pay | Admitting: Pain Medicine

## 2015-10-14 VITALS — BP 154/79 | HR 78 | Temp 98.3°F | Resp 16 | Ht 64.0 in | Wt 286.0 lb

## 2015-10-14 DIAGNOSIS — M47816 Spondylosis without myelopathy or radiculopathy, lumbar region: Secondary | ICD-10-CM | POA: Insufficient documentation

## 2015-10-14 DIAGNOSIS — Z9889 Other specified postprocedural states: Secondary | ICD-10-CM | POA: Insufficient documentation

## 2015-10-14 DIAGNOSIS — E1140 Type 2 diabetes mellitus with diabetic neuropathy, unspecified: Secondary | ICD-10-CM | POA: Insufficient documentation

## 2015-10-14 DIAGNOSIS — G43109 Migraine with aura, not intractable, without status migrainosus: Secondary | ICD-10-CM

## 2015-10-14 DIAGNOSIS — M5481 Occipital neuralgia: Secondary | ICD-10-CM

## 2015-10-14 DIAGNOSIS — R51 Headache: Secondary | ICD-10-CM | POA: Diagnosis present

## 2015-10-14 DIAGNOSIS — E1340 Other specified diabetes mellitus with diabetic neuropathy, unspecified: Secondary | ICD-10-CM

## 2015-10-14 DIAGNOSIS — G43009 Migraine without aura, not intractable, without status migrainosus: Secondary | ICD-10-CM

## 2015-10-14 DIAGNOSIS — M170 Bilateral primary osteoarthritis of knee: Secondary | ICD-10-CM | POA: Diagnosis not present

## 2015-10-14 DIAGNOSIS — M5136 Other intervertebral disc degeneration, lumbar region: Secondary | ICD-10-CM | POA: Insufficient documentation

## 2015-10-14 DIAGNOSIS — M545 Low back pain: Secondary | ICD-10-CM | POA: Diagnosis present

## 2015-10-14 DIAGNOSIS — M542 Cervicalgia: Secondary | ICD-10-CM | POA: Diagnosis present

## 2015-10-14 MED ORDER — OXYCODONE HCL 10 MG PO TABS: ORAL_TABLET | ORAL | Status: DC

## 2015-10-14 MED ORDER — TIZANIDINE HCL 4 MG PO TABS: ORAL_TABLET | ORAL | Status: DC

## 2015-10-14 NOTE — Progress Notes (Signed)
On 09-25-2015 patient admitted to hospital for N/V. Stayed for 2 days.

## 2015-10-14 NOTE — Patient Instructions (Signed)
PLAN   Continue present medications Zanaflex and oxycodone and begin Voltaren gel applications to the knee   F/U PCP Dr. Sampson GoonFitzgerald for evaliation of  BP diabetes mellitus and general medical condition. .   F/U surgical evaluation [facility]Patient will follow-up with his colleague Dr. Annia BeltZadiq as discussed to consider gastric sleeve surgery. Follow-up with Dr. Delbert HarnessMurphy Wainer regarding knees and orthopedic condition as discussed  F/U neurological evaluation as discussed  F/U Dr. Janeece RiggersSu for continued evaluation and treatment  May consider radiofrequency rhizolysis or intraspinal procedures pending response to present treatment and F/U evaluation . We we will avoid such procedures at this time   Patient to call [facility] should patient have concerns prior to scheduled return appointment.

## 2015-10-14 NOTE — Progress Notes (Signed)
Subjective:    Patient ID: Daisy [name], female    DOB: 07/16/1965, 50 y.o.   MRN: [id number]  HPI  The patient is a 50 year old female who returns to pain management for further evaluation and treatment of pain involving the neck upper extremity regions lower back and lower extremity region with headaches in pain involving the knees as well. The patient is with some improvement of control of her pain at this time. The patient is planning to undergo gastric sleeve for weight reduction. The patient will follow-up with her physician at [facility] of [place] as planned. we discussed patient's condition on today's visit and we will avoid interventional treatment for treatment of patient's condition. the patient is with obesity and has diabetes mellitus and has had complications regarding her surgeries. we will preferred to avoid injections especially with steroid to avoid infection and any complications. the patient was with understanding and agreed to suggested treatment plan. the patient will continue zanaflex and oxycodone as prescribed. all agreed to suggested treatment plan.     Review of Systems     Objective:   Physical Exam  There was tends to palpation of paraspinal musculature region of the cervical region cervical facet region palpation which reproduces pain of moderate degree with moderate tenderness over the splenius capitis and occipitalis musculature regions. Palpation of the acromioclavicular and glenohumeral joint regions reproduced pain of moderate degree. The patient was with unremarkable Spurling's maneuver. Tinel and Phalen's maneuver were without increased pain of significant degree. Palpation over the region of the thoracic region was with evidence of muscle spasms of moderate degree. No crepitus of the thoracic region was noted. Palpation over the region of the lumbar paraspinal must reason lumbar facet region associated with moderate to severe discomfort with lateral  bending rotation extension and palpation of the lumbar facets reproducing moderately severe discomfort he PSIS and PII S region reproduces moderate discomfort as well as moderate tenderness noted along the greater trochanteric region iliotibial band region. The patient was a tennis to palpation of the knees with crepitus of the knees with negative anterior and posterior drawer signs without ballottement of the patella. There was tenderness of the knees noted as well. Straight leg raise was tolerates approximately 20 without a definite increase of pain with dorsiflexion noted. There was no definite sensory deficit or dermatomal distribution detected. DTRs were difficult to elicit. There was negative clonus negative Homans. The abdomen was protuberant with mild tenderness to palpation noted. No costovertebral tenderness was noted.      Assessment & Plan:     Degenerative disc disease of the lumbar spine Multilevel degenerative changes lumbar spine with L4-L5 level involving predominantly  Degenerative joint disease of knees Status post surgery of knees  Diabetes mellitus Diabetic neuropathy  Bilateral occipital neuralgia  Status post recent abdominal surgery  Morbid obesity     PLAN   Continue present medications Zanaflex and oxycodone and begin Voltaren gel applications to the knee   F/U PCP Dr. Sampson Goon for evaliation of  BP diabetes mellitus and general medical condition. .   F/U surgical evaluation Surgery was performed by Dr.Raybar who has moved from [facility].Patient will follow-up with his colleague Dr. Annia Belt as discussed to consider gastric sleeve surgery. Follow-up with Dr. Delbert Harness regarding knees and orthopedic condition as discussed  F/U neurological evaluation as discussed  F/U Dr. Janeece Riggers for continued evaluation and treatment  May consider radiofrequency rhizolysis or intraspinal procedures pending response to present treatment and F/U evaluation .  We we will avoid  such procedures at this time   Patient to call [facility] should patient have concerns prior to scheduled return appointment.

## 2015-11-13 ENCOUNTER — Ambulatory Visit: Payer: Medicare Other | Attending: Pain Medicine | Admitting: Pain Medicine

## 2015-11-13 ENCOUNTER — Encounter: Payer: Self-pay | Admitting: Pain Medicine

## 2015-11-13 VITALS — BP 150/92 | HR 74 | Temp 98.4°F | Resp 16 | Ht 65.0 in | Wt 275.0 lb

## 2015-11-13 DIAGNOSIS — M5481 Occipital neuralgia: Secondary | ICD-10-CM | POA: Diagnosis not present

## 2015-11-13 DIAGNOSIS — Z9884 Bariatric surgery status: Secondary | ICD-10-CM | POA: Insufficient documentation

## 2015-11-13 DIAGNOSIS — M51369 Other intervertebral disc degeneration, lumbar region without mention of lumbar back pain or lower extremity pain: Secondary | ICD-10-CM

## 2015-11-13 DIAGNOSIS — M542 Cervicalgia: Secondary | ICD-10-CM | POA: Diagnosis present

## 2015-11-13 DIAGNOSIS — E1140 Type 2 diabetes mellitus with diabetic neuropathy, unspecified: Secondary | ICD-10-CM | POA: Insufficient documentation

## 2015-11-13 DIAGNOSIS — M170 Bilateral primary osteoarthritis of knee: Secondary | ICD-10-CM | POA: Diagnosis not present

## 2015-11-13 DIAGNOSIS — M47816 Spondylosis without myelopathy or radiculopathy, lumbar region: Secondary | ICD-10-CM | POA: Insufficient documentation

## 2015-11-13 DIAGNOSIS — M5136 Other intervertebral disc degeneration, lumbar region: Secondary | ICD-10-CM | POA: Insufficient documentation

## 2015-11-13 DIAGNOSIS — Z9889 Other specified postprocedural states: Secondary | ICD-10-CM | POA: Diagnosis not present

## 2015-11-13 DIAGNOSIS — G43009 Migraine without aura, not intractable, without status migrainosus: Secondary | ICD-10-CM

## 2015-11-13 DIAGNOSIS — G43109 Migraine with aura, not intractable, without status migrainosus: Secondary | ICD-10-CM

## 2015-11-13 DIAGNOSIS — E1340 Other specified diabetes mellitus with diabetic neuropathy, unspecified: Secondary | ICD-10-CM

## 2015-11-13 MED ORDER — OXYCODONE HCL 10 MG PO TABS: ORAL_TABLET | ORAL | Status: DC

## 2015-11-13 MED ORDER — TIZANIDINE HCL 4 MG PO TABS: ORAL_TABLET | ORAL | Status: DC

## 2015-11-13 NOTE — Progress Notes (Signed)
Subjective:    Patient ID: Daisy [name], female    DOB: 02-01-1966, 50 y.o.   MRN: [id number]  HPI   The patient is a 50 year old female who returns to pain management for further evaluation and treatment of pain involving the region of the neck entire back upper and lower extremity region. The patient also has history of headaches diabetes mellitus morbid obesity in addition to other medical conditions. At the present time patient is awaiting gastric sleeve procedure for weight reduction. We discussed patient's condition and patient continues to be with significant pain involving the knees. We will avoid interventional treatment for the knees at this time and will continue with noninterventional treatment measures as discussed and as explained to patient on today's visit. Patient denies any trauma change in events of daily living the call significant change in symptomatology we will continue Zanaflex and oxycodone and we'll avoid modification of medications as discussed we will avoid interventional treatment this time as discussed. The patient has had significant weight loss and will hopefully be able to proceed with gastric sleeve operation as planned all agreed to suggested treatment plan    Review of Systems     Objective:   Physical Exam  There was tenderness of the splenius capitis and occipitalis region a moderate degree with moderate tenderness over the cervical facet cervical paraspinal musculature regions. Palpation of the thoracic facet thoracic paraspinal musculature region was attends to palpation of moderate degree. Palpation of the acromioclavicular and glenohumeral joint regions reproduce mild to moderate discomfort and patient appeared to be with unremarkable Spurling's maneuver. Patient was with bilaterally equal grip strength and Tinel and Phalen's maneuver reproducing mild discomfort. Palpation over the thoracic region was with no crepitus of the thoracic region and there  was moderate muscle spasms noted in the upper mid and lower thoracic regions. Palpation over the region of the lumbar paraspinal musculatures and lumbar facet region was attends to palpation of moderate degree with lateral bending rotation extension and palpation of lumbar facets reproduce moderate discomfort. There was tenderness of the PSIS and PII S region a moderate degree. Straight leg raise was limited to 20 without increased pain with dorsiflexion noted. There was negative clonus negative Homans. There were well-healed surgical scars of the knees without increased warmth and erythema in the region of the knees. The knees were with significant tenderness to palpation in crepitus of the knees with severe increased pain with range of motion maneuvers of the knee. There was no ballottement of the patella. No increased warmth and erythema of the knees noted. EHL strength was decreased. There was question decreased sensation of the lower extremities in a stocking-type distribution. There was negative clonus negative Homans. Abdomen protuberant without excessive tends to palpation of the maintenance noted.      Assessment & Plan:     Degenerative disc disease of the lumbar spine Multilevel degenerative changes lumbar spine with L4-L5 level involving predominantly  Degenerative joint disease of knees Status post surgery of knees  Diabetes mellitus Diabetic neuropathy  Bilateral occipital neuralgia  Status post recent abdominal surgery  Morbid obesity    PLAN   Continue present medications Zanaflex and oxycodone  Apply Voltaren gel applications to the knee   F/U PCP Dr. Sampson GoonFitzgerald for evaliation of  BP diabetes mellitus and general medical condition. .   F/U surgical evaluation Surgery was performed by Dr[facility]Raybar who [name].Patient will follow-up with his colleague Dr. Annia BeltZadiq as discussed to consider gastric sleeve  surgery. Follow-up with Dr. Delbert Harness regarding knees and  orthopedic condition as discussed  F/U neurological evaluation as discussed  F/U Dr. Janeece Riggers for continued evaluation and treatment  May consider radiofrequency rhizolysis or intraspinal procedures pending response to present treatment and F/U evaluation . We we will avoid such procedures at this time   Patient to call [facility] should patient have concerns prior to scheduled return appointment.

## 2015-11-13 NOTE — Patient Instructions (Signed)
PLAN   Continue present medications Zanaflex and oxycodone  Apply Voltaren gel applications to the knee   F/U PCP Dr. Sampson GoonFitzgerald for evaliation of  BP diabetes mellitus and general medical condition. .   F/U surgical evaluation Surgery was [facility]Patient will follow-up with his colleague Dr. Annia BeltZadiq as discussed to consider gastric sleeve surgery. Follow-up with Dr. Delbert HarnessMurphy Wainer regarding knees and orthopedic condition as discussed  F/U neurological evaluation as discussed  F/U Dr. Janeece RiggersSu for continued evaluation and treatment  May consider radiofrequency rhizolysis or intraspinal procedures pending response to present treatment and F/U evaluation . We we will avoid such procedures at this time   Patient to call [facility] should patient have concerns prior to scheduled return appointment.

## 2015-11-13 NOTE — Progress Notes (Signed)
Safety precautions to be maintained throughout the outpatient stay will include: orient to surroundings, keep bed in low position, maintain call bell within reach at all times, provide assistance with transfer out of bed and ambulation.  

## 2015-11-20 LAB — TOXASSURE SELECT 13 (MW), URINE: PDF: 0

## 2015-11-20 NOTE — Progress Notes (Signed)
Quick Note:  Reviewed. ______ 

## 2015-12-11 ENCOUNTER — Ambulatory Visit: Payer: Medicare Other | Attending: Pain Medicine | Admitting: Pain Medicine

## 2015-12-11 ENCOUNTER — Encounter: Payer: Self-pay | Admitting: Pain Medicine

## 2015-12-11 VITALS — BP 185/103 | HR 68 | Temp 98.5°F | Resp 16 | Ht 64.0 in | Wt 275.0 lb

## 2015-12-11 DIAGNOSIS — M1710 Unilateral primary osteoarthritis, unspecified knee: Secondary | ICD-10-CM | POA: Diagnosis present

## 2015-12-11 DIAGNOSIS — G43009 Migraine without aura, not intractable, without status migrainosus: Secondary | ICD-10-CM

## 2015-12-11 DIAGNOSIS — E1340 Other specified diabetes mellitus with diabetic neuropathy, unspecified: Secondary | ICD-10-CM

## 2015-12-11 DIAGNOSIS — M5136 Other intervertebral disc degeneration, lumbar region: Secondary | ICD-10-CM

## 2015-12-11 DIAGNOSIS — M47816 Spondylosis without myelopathy or radiculopathy, lumbar region: Secondary | ICD-10-CM | POA: Insufficient documentation

## 2015-12-11 DIAGNOSIS — M5481 Occipital neuralgia: Secondary | ICD-10-CM

## 2015-12-11 DIAGNOSIS — E1140 Type 2 diabetes mellitus with diabetic neuropathy, unspecified: Secondary | ICD-10-CM | POA: Insufficient documentation

## 2015-12-11 DIAGNOSIS — Z9889 Other specified postprocedural states: Secondary | ICD-10-CM | POA: Insufficient documentation

## 2015-12-11 DIAGNOSIS — G43109 Migraine with aura, not intractable, without status migrainosus: Secondary | ICD-10-CM

## 2015-12-11 DIAGNOSIS — M170 Bilateral primary osteoarthritis of knee: Secondary | ICD-10-CM

## 2015-12-11 DIAGNOSIS — K2970 Gastritis, unspecified, without bleeding: Secondary | ICD-10-CM

## 2015-12-11 MED ORDER — TIZANIDINE HCL 4 MG PO TABS: ORAL_TABLET | ORAL | Status: DC

## 2015-12-11 MED ORDER — OXYCODONE HCL 10 MG PO TABS: ORAL_TABLET | ORAL | Status: DC

## 2015-12-11 NOTE — Patient Instructions (Addendum)
PLAN   Continue present medications Zanaflex and oxycodone  Apply Voltaren gel applications to the knee   F/U PCP Dr. Sampson GoonFitzgerald for evaliation of  BP diabetes mellitus and general medical condition. Please see Dr. Sampson GoonFitzgerald today or go to acute care for evaluation and treatment of significantly elevated blood pressure.   F/U surgical evaluation Surgery was [facility]Patient will follow-up with his colleague Dr. Annia BeltZadiq as discussed to consider gastric sleeve surgery Follow-up with Dr. Delbert HarnessMurphy Wainer regarding knees and orthopedic condition as discussed  F/U neurological evaluation as discussed  F/U Dr. Janeece RiggersSu for continued evaluation and treatment  May consider radiofrequency rhizolysis or intraspinal procedures pending response to present treatment and F/U evaluation . We we will avoid such procedures at this time   Patient to call [facility] Center should patient have concerns prior to scheduled return appointment.

## 2015-12-11 NOTE — Progress Notes (Signed)
Subjective:    Patient ID: Daisy [name], female    DOB: 1966-05-01, 50 y.o.   MRN: [id number]  HPI  Th. The patient been informed that she has significant degenerative joint disease of the knee. At the present time patient is not being considered for total knee replacement. The patient is presently undergoing evaluation for weight reduction surgery. We will continue medications Zanaflex and oxycodone at this time and we will consider modification of treatment regimen pending follow-up evaluation. We informed patient that she should follow-up with Dr. Sampson GoonFitzgerald today I'll go to acute care for further evaluation and treatment of significant elevated blood pressure. The patient states that her blood pressure is always normal when she goes for evaluation by Dr. Sampson GoonFitzgerald after we send patient for elevated blood pressure. The patient will follow-up with Dr. Sampson GoonFitzgerald as recommended. We will continue medications as prescribed and we'll remain available to consider this modification of treatment regimen pending follow-up evaluation. All agreed to suggested treatment plan e patient is a 50 year old female who returns to pain management for further evaluation and treatment of pain involving the neck associated with headaches as well as pain involving the upper mid lower back and lower extremity region. The patient also admits to significant pain involving the left knee and is undergone further evaluation by orthopedic surgeon Dr. Delbert HarnessMurphy-Wainer   Review of Systems     Objective:   Physical Exam  There was tenderness of the splenius capitis and occipitalis region of mild to moderate degree with no masses of the hip negative noted. There was minimal tenderness of the temporomandibular joint region and no bounding pulsations of the temporal region were noted. Palpation over the cervical facet cervical paraspinal muscles region was with moderate tenderness to palpation. There was moderate tenderness of the  thoracic paraspinal musculature region is well palpation of the acromioclavicular and glenohumeral joint regions reproduce mild discomfort. Patient was with no significant increase of pain with Tinel and Phalen's maneuver. Palpation over the region of the thoracic region thoracic facet region was with moderate tends to palpation with no crepitus of the thoracic region noted. Palpation over the region of the lumbar paraspinal must reason lumbar facet region was with moderate tenderness to palpation as well lateral bending rotation extension and palpation of the lumbar facets reproduced moderate discomfort as well there was tenderness over the region of the knee on the left with crepitus of the knee and with negative anterior and posterior drawer signs. There was well-healed surgical scar of the knee without increased warmth erythema of the knee and no ballottement of the patella. EHL strength the lower extremities was decreased. Straight leg raising limited to 20 without increased pain with dorsiflexion noted. There was negative clonus negative Homans. Abdomen was mild tinnitus to palpation and abdomen was protuberant. No costovertebral angle tenderness noted      Assessment & Plan:      Degenerative disc disease of the lumbar spine Multilevel degenerative changes lumbar spine with L4-L5 level involving predominantly  Degenerative joint disease of knees Status post surgery of knees  Diabetes mellitus Diabetic neuropathy  Bilateral occipital neuralgia  Status post recent abdominal surgery  Morbid obesity     PLAN  Continue present medications Zanaflex and oxycodone Apply Voltaren gel applications to the knee   F/U PCP Dr. Sampson GoonFitzgerald for evaliation of BP diabetes mellitus and general medical condition. Please see Dr. Sampson GoonFitzgerald today or go to acute care for evaluation and treatment of significantly elevated blood pressure.  F/U surgical evaluation Surgery was [facility]Patient will follow-up with his colleague Dr. Annia BeltZadiq as discussed to consider gastric sleeve surgery  Follow-up with Dr. Delbert HarnessMurphy Wainer regarding knees and orthopedic condition as discussed   F/U neurological evaluation as discussed   F/U Dr. Janeece RiggersSu for continued evaluation and treatment   May consider radiofrequency rhizolysis or intraspinal procedures pending response to present treatment and F/U evaluation . We we will avoid such procedures at this time   Patient to call [facility] Center should patient have concerns prior to scheduled return appointment.

## 2015-12-11 NOTE — Progress Notes (Signed)
Safety precautions to be maintained throughout the outpatient stay will include: orient to surroundings, keep bed in low position, maintain call bell within reach at all times, provide assistance with transfer out of bed and ambulation.  

## 2016-01-06 ENCOUNTER — Encounter: Payer: Self-pay | Admitting: Pain Medicine

## 2016-01-06 ENCOUNTER — Ambulatory Visit: Payer: Medicare Other | Attending: Pain Medicine | Admitting: Pain Medicine

## 2016-01-06 VITALS — BP 158/86 | HR 74 | Temp 97.7°F | Resp 16 | Ht 64.0 in | Wt 278.0 lb

## 2016-01-06 DIAGNOSIS — E1140 Type 2 diabetes mellitus with diabetic neuropathy, unspecified: Secondary | ICD-10-CM | POA: Diagnosis not present

## 2016-01-06 DIAGNOSIS — M542 Cervicalgia: Secondary | ICD-10-CM | POA: Diagnosis present

## 2016-01-06 DIAGNOSIS — M47816 Spondylosis without myelopathy or radiculopathy, lumbar region: Secondary | ICD-10-CM | POA: Insufficient documentation

## 2016-01-06 DIAGNOSIS — M5136 Other intervertebral disc degeneration, lumbar region: Secondary | ICD-10-CM

## 2016-01-06 DIAGNOSIS — E876 Hypokalemia: Secondary | ICD-10-CM

## 2016-01-06 DIAGNOSIS — E1340 Other specified diabetes mellitus with diabetic neuropathy, unspecified: Secondary | ICD-10-CM

## 2016-01-06 DIAGNOSIS — M170 Bilateral primary osteoarthritis of knee: Secondary | ICD-10-CM | POA: Diagnosis not present

## 2016-01-06 DIAGNOSIS — M62830 Muscle spasm of back: Secondary | ICD-10-CM | POA: Diagnosis not present

## 2016-01-06 DIAGNOSIS — M5481 Occipital neuralgia: Secondary | ICD-10-CM

## 2016-01-06 DIAGNOSIS — R109 Unspecified abdominal pain: Secondary | ICD-10-CM

## 2016-01-06 DIAGNOSIS — Z9889 Other specified postprocedural states: Secondary | ICD-10-CM | POA: Insufficient documentation

## 2016-01-06 DIAGNOSIS — R51 Headache: Secondary | ICD-10-CM | POA: Diagnosis present

## 2016-01-06 MED ORDER — TIZANIDINE HCL 4 MG PO TABS: ORAL_TABLET | ORAL | 0 refills | Status: DC

## 2016-01-06 MED ORDER — OXYCODONE HCL 10 MG PO TABS: ORAL_TABLET | ORAL | 0 refills | Status: DC

## 2016-01-06 NOTE — Patient Instructions (Signed)
PLAN   Continue present medications Zanaflex and oxycodone  Apply Voltaren gel applications to the knee   F/U PCP Dr. Sampson Goon for evaliation of  BP diabetes mellitus and general medical condition.    F/U surgical evaluation Surgery was performed by Dr.Raybar who has moved from [facility].Patient will follow-up with his colleague Dr. Annia Belt as discussed to consider gastric sleeve surgery Follow-up with Dr. Delbert Harness regarding knees and orthopedic condition as discussed  F/U neurological evaluation as discussed  F/U Dr. Janeece Riggers for continued evaluation and treatment and address Xanax as discussed  May consider radiofrequency rhizolysis or intraspinal procedures pending response to present treatment and F/U evaluation . We we will avoid such procedures at this time   Patient to call [facility] Management Center should patient have concerns prior to scheduled return appointment.

## 2016-01-06 NOTE — Progress Notes (Signed)
Safety precautions to be maintained throughout the outpatient stay will include: orient to surroundings, keep bed in low position, maintain call bell within reach at all times, provide assistance with transfer out of bed and ambulation.  Pt taking Xanax and onxy- talked to pt about decresing xanax and then stopped

## 2016-01-06 NOTE — Progress Notes (Signed)
The patient is a 50 year old female who returns to pain management for further evaluation and treatment of pain involving the neck associated with headaches as well as pain involving the upper extremity mid back lower back lower extremity region. The patient is being considered for interventional treatment for weight reduction. The patient will follow-up with surgeon as discussed for consideration of surgery for weight loss this plan. The patient continues to have pain involving the back of the neck with radiation of pain to the hip precipitating headaches as well as pain of the upper extremities lower extremities and region of the lumbar region. The patient is with significant pain involving the left knee as well. We preferred to avoid interventional treatment of patient's due to patient's general medical condition. We will continue presently prescribed medications as discussed. We will also have patient follow-up with Dr. Janeece Riggers to discuss patient's use of Xanax which we have informed patient must be decreased and discontinued if we are: To continue with the present treatment regimen. The patient will follow-up with Dr. Janeece Riggers as discussed and we will consider additional modifications of treatment regimen as discussed pending follow-up evaluations. All agreed to suggested treatment plan.    Physical examination  There was tenderness of the splenius capitis and occipitalis region a moderate degree. No new masses of the head and neck were noted. There were no bounding pulsations of the temporal region noted. There was minimal tenderness to palpation of the acromioclavicular and glenohumeral joint regions. The patient appeared to be unremarkable Spurling's maneuver and was able to perform drop test with moderate difficulty. Palpation of the thoracic region was attends to palpation without crepitus of the thoracic region. There was significant muscle spasms noted in the upper mid and lower thoracic paraspinal  musculature region. Palpation over the lumbar region was attends to palpation of moderate degree with lateral bending rotation extension and palpation of the lumbar facets reproducing moderately severe discomfort. Straight leg raise was limited to 20 without increased pain with dorsiflexion noted. Well-healed surgical scars of the knee without increased warmth and erythema in the region of the knee with negative anterior and posterior drawer signs without ballottement of the patella. There was significant increased pain of the knee with range of motion maneuvers of the knee especially the left knee. EHL strength appeared to be decreased without sensory deficit or dermatomal distribution detected. Negative clonus negative Homans. Abdomen protuberant without excessive tends to palpation with mild lower quadrant abdominal tends to palpation of the abdomen noted with no costovertebral tenderness noted.    Assessment    Degenerative disc disease of the lumbar spine Multilevel degenerative changes lumbar spine with L4-L5 level involving predominantly  Degenerative joint disease of knees Status post surgery of knees  Diabetes mellitus Diabetic neuropathy  Bilateral occipital neuralgia  Status post recent abdominal surgery  Morbid obesity      PLAN   Continue present medications Zanaflex and oxycodone  Apply Voltaren gel applications to the knees as previously discussed   F/U PCP Dr. Sampson Goon for evaliation of  BP diabetes mellitus and general medical condition.    F/U surgical evaluation Surgery was performed by Dr.Raybar who has moved from [facility].Patient will follow-up with his colleague Dr. Annia Belt as discussed to consider gastric sleeve surgery Follow-up with Dr. Delbert Harness regarding knees and orthopedic condition as discussed  F/U neurological evaluation as discussed  F/U Dr. Janeece Riggers for continued evaluation and treatment and address Xanax as discussed  May consider radiofrequency  rhizolysis or  intraspinal procedures pending response to present treatment and F/U evaluation . We we will avoid such procedures at this time   Patient to call Pain Management Center should patient have concerns prior to scheduled return appointment.

## 2016-01-16 ENCOUNTER — Ambulatory Visit
Admission: RE | Admit: 2016-01-16 | Discharge: 2016-01-16 | Disposition: A | Discharge status: Home / Self Care | Payer: Medicare Other | Source: Ambulatory Visit | Attending: Physician Assistant | Admitting: Physician Assistant

## 2016-01-16 ENCOUNTER — Other Ambulatory Visit: Payer: Self-pay | Admitting: Physician Assistant

## 2016-01-16 DIAGNOSIS — R0602 Shortness of breath: Secondary | ICD-10-CM

## 2016-01-16 DIAGNOSIS — R7989 Other specified abnormal findings of blood chemistry: Secondary | ICD-10-CM

## 2016-01-16 MED ORDER — IOPAMIDOL (ISOVUE-370) INJECTION 76%: 100.0000 mL | Freq: Once | INTRAVENOUS | Status: DC | PRN

## 2016-01-26 ENCOUNTER — Encounter: Payer: Self-pay | Admitting: Emergency Medicine

## 2016-01-26 ENCOUNTER — Emergency Department
Admission: EM | Admit: 2016-01-26 | Discharge: 2016-01-27 | Disposition: A | Discharge status: Home / Self Care | Payer: Medicare Other | Attending: Emergency Medicine | Admitting: Emergency Medicine

## 2016-01-26 DIAGNOSIS — K2970 Gastritis, unspecified, without bleeding: Secondary | ICD-10-CM

## 2016-01-26 DIAGNOSIS — R112 Nausea with vomiting, unspecified: Secondary | ICD-10-CM

## 2016-01-26 DIAGNOSIS — R1013 Epigastric pain: Secondary | ICD-10-CM | POA: Diagnosis present

## 2016-01-26 DIAGNOSIS — E119 Type 2 diabetes mellitus without complications: Secondary | ICD-10-CM | POA: Insufficient documentation

## 2016-01-26 DIAGNOSIS — Z79899 Other long term (current) drug therapy: Secondary | ICD-10-CM | POA: Diagnosis not present

## 2016-01-26 DIAGNOSIS — I10 Essential (primary) hypertension: Secondary | ICD-10-CM | POA: Diagnosis not present

## 2016-01-26 DIAGNOSIS — Z7984 Long term (current) use of oral hypoglycemic drugs: Secondary | ICD-10-CM | POA: Diagnosis not present

## 2016-01-26 MED ORDER — ONDANSETRON 4 MG PO TBDP: 4.0000 mg | ORAL_TABLET | Freq: Once | ORAL | Status: DC | PRN

## 2016-01-26 MED ORDER — ONDANSETRON HCL 4 MG/2ML IJ SOLN
4.0000 mg | Freq: Once | INTRAMUSCULAR | Status: DC
  Filled 2016-01-26: qty 2

## 2016-01-26 NOTE — ED Provider Notes (Signed)
[facility] Emergency Department Provider Note   ____________________________________________   First MD Initiated Contact with Patient 01/26/16 2350     (approximate)  I have reviewed the triage vital signs and the nursing notes.   HISTORY  Chief Complaint Abdominal Pain and Emesis    HPI Daisy [name] is a 50 y.o. female who comes into the hospital today with vomiting and abdominal pain. She reports that this started around 6 to 6:30. She reports that this has occurred before and she was told it was gastritis. She was in the hospital for a few days. The pain is in her upper abdomen and radiates to her back. She's not had any fevers but is having some yellowish vomiting. She reports that it was initially her food. The patient had a bowel movement since she's been here and she reports it was normal and not very loose. There was no blood in her stool. The patient rates her pain a 10 out of 10 in intensity. She's had some shortness of breath and chest pain as well which she reports was mild. She reports that it hurts in her belly when she's breathing as well. The patient drank about 2-3 days ago. She reports that she is unable to tolerate the pain so she decided to come into the hospital to get checked out.   Past Medical History:  Diagnosis Date  . Anxiety   . Depression   . Diabetes mellitus without complication (HCC)   . GERD (gastroesophageal reflux disease)   . Hypertension   . Migraine   . Obesity     Patient Active Problem List   Diagnosis Date Noted  . Hypokalemia 09/28/2015  . Gastroenteritis 09/28/2015  . Intractable abdominal pain 05/05/2015  . Gastritis 01/07/2015  . DDD (degenerative disc disease), lumbar 11/28/2014  . DJD (degenerative joint disease) of knee 11/28/2014  . Migraine headache 11/28/2014  . Bilateral occipital neuralgia 11/28/2014  . Morbid obesity (HCC) 11/28/2014  . Neuropathy due to secondary diabetes (HCC)  11/28/2014    Past Surgical History:  Procedure Laterality Date  . ABDOMINAL HYSTERECTOMY    . APPENDECTOMY    . APPLICATION OF WOUND VAC    . CHOLECYSTECTOMY    . HERNIA REPAIR    . KNEE SURGERY      Prior to Admission medications   Medication Sig Start Date End Date Taking? Authorizing Provider  alprazolam Prudy Feeler) 2 MG tablet Take 2 mg by mouth 3 (three) times daily as needed for anxiety.     Historical Provider, MD  diclofenac sodium (VOLTAREN) 1 % GEL Apply 2-4 g to painful areas of the skin 4 times per day if tolerated. ONLY APPLY A MAXIMUM OF 4 GRAMS PER APPLICATION 09/18/15   Ewing Schlein, MD  FLUoxetine (PROZAC) 20 MG capsule Take 20 mg by mouth daily.    Historical Provider, MD  furosemide (LASIX) 20 MG tablet Take 20 mg by mouth daily.    Historical Provider, MD  lamoTRIgine (LAMICTAL) 100 MG tablet Take 100 mg by mouth daily.    Historical Provider, MD  lisinopril (PRINIVIL,ZESTRIL) 20 MG tablet Take 20 mg by mouth daily.    Historical Provider, MD  metFORMIN (GLUCOPHAGE) 500 MG tablet Take 500 mg by mouth daily.    Historical Provider, MD  metoCLOPramide (REGLAN) 10 MG tablet Take 1 tablet (10 mg total) by mouth every 8 (eight) hours as needed. 01/27/16   Rebecka Apley, MD  metoprolol succinate (TOPROL-XL) 25 MG 24  hr tablet Take 50 mg by mouth daily.    Historical Provider, MD  nortriptyline (PAMELOR) 25 MG capsule Take 25 mg by mouth 2 (two) times daily as needed (for anxiety). Reported on 12/11/2015    Historical Provider, MD  ondansetron (ZOFRAN) 4 MG tablet Take 1 tablet (4 mg total) by mouth every 8 (eight) hours as needed for nausea or vomiting. 09/25/15   Jennye MoccasinBrian S Quigley, MD  Oxycodone HCl 10 MG TABS Limit  4 - 7 tabs by mouth per day if tolerated 01/06/16   Ewing SchleinGregory Crisp, MD  oxyCODONE-acetaminophen (ROXICET) 5-325 MG tablet Take 1 tablet by mouth every 6 (six) hours as needed. 01/27/16   Rebecka ApleyAllison P Antoninette Lerner, MD  pantoprazole (PROTONIX) 40 MG tablet Take 40 mg by mouth 2  (two) times daily.     Historical Provider, MD  polyethylene glycol (MIRALAX / GLYCOLAX) packet Take 17 g by mouth daily as needed for mild constipation. 05/09/15   Delfino LovettVipul Shah, MD  potassium chloride (KCL) 2 mEq/mL SOLN oral liquid Take by mouth 2 (two) times daily as needed (takes every other day with lasix).    Historical Provider, MD  potassium chloride SA (K-DUR,KLOR-CON) 20 MEQ tablet Take 20 mEq by mouth daily.    Historical Provider, MD  promethazine (PHENERGAN) 12.5 MG suppository Place 1 suppository (12.5 mg total) rectally every 6 (six) hours as needed for nausea or vomiting. 09/25/15   Jennye MoccasinBrian S Quigley, MD  promethazine (PHENERGAN) 12.5 MG tablet Take 1 tablet (12.5 mg total) by mouth every 6 (six) hours as needed for nausea or vomiting. 09/25/15   Jennye MoccasinBrian S Quigley, MD  sucralfate (CARAFATE) 1 G tablet Take 1 tablet (1 g total) by mouth 4 (four) times daily. 05/09/15   Delfino LovettVipul Shah, MD  sucralfate (CARAFATE) 1 g tablet Take 1 tablet (1 g total) by mouth 2 (two) times daily. 01/27/16   Rebecka ApleyAllison P Triston Lisanti, MD  tiZANidine (ZANAFLEX) 4 MG tablet Limit 1-3 tabs by mouth per day if tolerated 01/06/16   Ewing SchleinGregory Crisp, MD  traZODone (DESYREL) 100 MG tablet Take 100 mg by mouth at bedtime.    Historical Provider, MD  zolpidem (AMBIEN CR) 12.5 MG CR tablet Take 12.5 mg by mouth at bedtime as needed for sleep.    Historical Provider, MD    Allergies Acetaminophen-codeine; Codeine; Ibuprofen; Morphine; Propoxyphene; Sulfa antibiotics; and Duloxetine  Family History  Problem Relation Age of Onset  . Arthritis Mother   . Depression Mother   . Diabetes Mother   . Heart disease Mother   . Hyperlipidemia Mother   . Hypertension Mother   . Stroke Mother   . Arthritis Father   . Diabetes Father   . Hearing loss Father   . Heart disease Father   . Hyperlipidemia Father   . Hypertension Father     Social History Social History  Substance Use Topics  . Smoking status: Never Smoker  . Smokeless  tobacco: Never Used  . Alcohol use 0.0 oz/week     Comment: occasional    Review of Systems Constitutional: No fever/chills Eyes: No visual changes. ENT: No sore throat. Cardiovascular:  chest pain. Respiratory:  shortness of breath. Gastrointestinal: abdominal pain.   nausea,  vomiting.  No diarrhea.  No constipation. Genitourinary: Negative for dysuria. Musculoskeletal: Negative for back pain. Skin: Negative for rash. Neurological: Negative for headaches, focal weakness or numbness.  10-point ROS otherwise negative.  ____________________________________________   PHYSICAL EXAM:  VITAL SIGNS: ED Triage Vitals  Enc Vitals  Group     BP 01/26/16 2313 (!) 213/110     Pulse Rate 01/26/16 2313 73     Resp 01/26/16 2313 18     Temp 01/26/16 2313 98.7 F (37.1 C)     Temp Source 01/26/16 2313 Oral     SpO2 01/26/16 2313 98 %     Weight 01/26/16 2313 278 lb (126.1 kg)     Height 01/26/16 2313 5\' 4"  (1.626 m)     Head Circumference --      Peak Flow --      Pain Score 01/26/16 2314 10     Pain Loc --      Pain Edu? --      Excl. in GC? --     Constitutional: Alert and oriented. Well appearing and in Moderate distress. Eyes: Conjunctivae are normal. PERRL. EOMI. Head: Atraumatic. Nose: No congestion/rhinnorhea. Mouth/Throat: Mucous membranes are moist.  Oropharynx non-erythematous. Cardiovascular: Normal rate, regular rhythm. Grossly normal heart sounds.  Good peripheral circulation. Respiratory: Normal respiratory effort.  No retractions. Lungs CTAB. Gastrointestinal: Soft with diffuse tenderness to palpation worse in the epigastric area. No distention. Decreased bowel sounds Musculoskeletal: No lower extremity tenderness nor edema.   Neurologic:  Normal speech and language.  Skin:  Skin is warm, dry and intact.  Psychiatric: Mood and affect are normal.   ____________________________________________   LABS (all labs ordered are listed, but only abnormal results are  displayed)  Labs Reviewed  COMPREHENSIVE METABOLIC PANEL - Abnormal; Notable for the following:       Result Value   Chloride 99 (*)    Glucose, Bld 140 (*)    Total Protein 8.2 (*)    All other components within normal limits  URINALYSIS COMPLETEWITH MICROSCOPIC ([facility] ONLY) - Abnormal; Notable for the following:    Color, Urine STRAW (*)    APPearance CLEAR (*)    Protein, ur 30 (*)    Squamous Epithelial / LPF 0-5 (*)    All other components within normal limits  CBC - Abnormal; Notable for the following:    WBC 12.3 (*)    RDW 17.3 (*)    All other components within normal limits  LIPASE, BLOOD  TROPONIN I  TROPONIN I   ____________________________________________  EKG  ED ECG REPORT I, Rebecka ApleyWebster,  Shalaya Swailes P, the attending physician, personally viewed and interpreted this ECG.   Date: 01/27/2016  EKG Time: 2340  Rate: 75  Rhythm: normal sinus rhythm  Axis: normal  Intervals:none  ST&T Change: none  ____________________________________________  RADIOLOGY  CT abd and pelvis: No acute process demonstrated in the abdomen or pelvis. Scarring in the anterior abdominal wall similar to prior study. Small esophageal hiatal hernia. No bowel obstruction or inflammation identified ____________________________________________   PROCEDURES  Procedure(s) performed: None  Procedures  Critical Care performed: No  ____________________________________________   INITIAL IMPRESSION / ASSESSMENT AND PLAN / ED COURSE  Pertinent labs & imaging results that were available during my care of the patient were reviewed by me and considered in my medical decision making (see chart for details).  This is a 50 year old female who comes into the hospital today with some epigastric pain that radiates to her back. The patient is having some significant pain. I will give the patient a dose of Dilaudid, a liter of normal saline as well as some Zofran. I will determine if the patient is  having some pancreatitis by looking at her lipase as well as her liver enzymes. I will  then determine if the patient needs a CT scan versus an ultrasound to evaluate for a dilated common bile duct. The patient will be reassessed when she's received some medication and I received the results of her blood work.  Clinical Course   The patient did receive multiple doses of Dilaudid as well as Zofran and Reglan. I did give her a GI cocktail as well as some Carafate. She reports that she feels improved but still has some pain. I will discharge the patient to home as her CT scan is negative. The patient's blood work is also unremarkable. She should follow up with her primary care physician and then a GI physician for further evaluation of this pain in the symptoms. The patient understands the plan as stated.  ____________________________________________   FINAL CLINICAL IMPRESSION(S) / ED DIAGNOSES  Final diagnoses:  Gastritis  Epigastric pain  Non-intractable vomiting with nausea, vomiting of unspecified type      NEW MEDICATIONS STARTED DURING THIS VISIT:  New Prescriptions   METOCLOPRAMIDE (REGLAN) 10 MG TABLET    Take 1 tablet (10 mg total) by mouth every 8 (eight) hours as needed.   OXYCODONE-ACETAMINOPHEN (ROXICET) 5-325 MG TABLET    Take 1 tablet by mouth every 6 (six) hours as needed.   SUCRALFATE (CARAFATE) 1 G TABLET    Take 1 tablet (1 g total) by mouth 2 (two) times daily.     Note:  This document was prepared using Dragon voice recognition software and may include unintentional dictation errors.    Rebecka Apley, MD 01/27/16 [phone]

## 2016-01-26 NOTE — ED Notes (Signed)
Pt in via triage with complaints of sudden onset nausea/vomiting since approximately 1800 tonight.  Pt complains of generalized abdominal with worse pain being in epigastric area radiating through to back.  Pt A/OX4, pt appears uncomfortable, hypertensive upon arrival, other vitals WDL.  MD notified.

## 2016-01-26 NOTE — ED Triage Notes (Signed)
Pt presents to triage via wheelchair, with c/o generalized abdominal pain, vomiting, and diarrhea since 5 pm today. Pt presents with emesis bag with green emesis in bag. Pt denies chest pain or shortness of breath. Pt denies blood stool or emesis.

## 2016-01-27 ENCOUNTER — Emergency Department: Payer: Medicare Other

## 2016-01-27 DIAGNOSIS — K2970 Gastritis, unspecified, without bleeding: Secondary | ICD-10-CM | POA: Diagnosis not present

## 2016-01-27 LAB — CBC
HEMATOCRIT: 38.9 % (ref 35.0–47.0)
Hemoglobin: 12.5 g/dL (ref 12.0–16.0)
MCH: 26.4 pg (ref 26.0–34.0)
MCHC: 32.3 g/dL (ref 32.0–36.0)
MCV: 81.8 fL (ref 80.0–100.0)
Platelets: 332 10*3/uL (ref 150–440)
RBC: 4.75 MIL/uL (ref 3.80–5.20)
RDW: 17.3 % — ABNORMAL HIGH (ref 11.5–14.5)
WBC: 12.3 10*3/uL — AB (ref 3.6–11.0)

## 2016-01-27 LAB — URINALYSIS COMPLETE WITH MICROSCOPIC (ARMC ONLY)
BACTERIA UA: NONE SEEN
BILIRUBIN URINE: NEGATIVE
GLUCOSE, UA: NEGATIVE mg/dL
HGB URINE DIPSTICK: NEGATIVE
KETONES UR: NEGATIVE mg/dL
Leukocytes, UA: NEGATIVE
NITRITE: NEGATIVE
Protein, ur: 30 mg/dL — AB
Specific Gravity, Urine: 1.012 (ref 1.005–1.030)
pH: 8 (ref 5.0–8.0)

## 2016-01-27 LAB — COMPREHENSIVE METABOLIC PANEL
ALK PHOS: 67 U/L (ref 38–126)
ALT: 15 U/L (ref 14–54)
AST: 18 U/L (ref 15–41)
Albumin: 4.3 g/dL (ref 3.5–5.0)
Anion gap: 14 (ref 5–15)
BILIRUBIN TOTAL: 0.5 mg/dL (ref 0.3–1.2)
BUN: 8 mg/dL (ref 6–20)
CALCIUM: 9.1 mg/dL (ref 8.9–10.3)
CO2: 24 mmol/L (ref 22–32)
CREATININE: 0.66 mg/dL (ref 0.44–1.00)
Chloride: 99 mmol/L — ABNORMAL LOW (ref 101–111)
Glucose, Bld: 140 mg/dL — ABNORMAL HIGH (ref 65–99)
Potassium: 3.8 mmol/L (ref 3.5–5.1)
SODIUM: 137 mmol/L (ref 135–145)
TOTAL PROTEIN: 8.2 g/dL — AB (ref 6.5–8.1)

## 2016-01-27 LAB — TROPONIN I: Troponin I: <0.03 ng/mL (ref <0.03)

## 2016-01-27 LAB — LIPASE, BLOOD: Lipase: 17 U/L (ref 11–51)

## 2016-01-27 MED ORDER — SUCRALFATE 1 G PO TABS
1.0000 g | ORAL_TABLET | Freq: Once | ORAL | Status: AC
  Administered 2016-01-27: 1 g via ORAL
  Filled 2016-01-27: qty 1

## 2016-01-27 MED ORDER — HYDROMORPHONE HCL 1 MG/ML IJ SOLN
1.0000 mg | Freq: Once | INTRAMUSCULAR | Status: AC
  Administered 2016-01-27: 1 mg via INTRAVENOUS
  Filled 2016-01-27: qty 1

## 2016-01-27 MED ORDER — METOCLOPRAMIDE HCL 10 MG PO TABS: 10.0000 mg | ORAL_TABLET | Freq: Three times a day (TID) | ORAL | 0 refills | Status: DC | PRN

## 2016-01-27 MED ORDER — GI COCKTAIL ~~LOC~~
30.0000 mL | Freq: Once | ORAL | Status: AC
  Administered 2016-01-27: 30 mL via ORAL
  Filled 2016-01-27: qty 30

## 2016-01-27 MED ORDER — HYDROMORPHONE HCL 1 MG/ML IJ SOLN
1.0000 mg | Freq: Once | INTRAMUSCULAR | Status: AC
  Administered 2016-01-27: 1 mg via INTRAVENOUS

## 2016-01-27 MED ORDER — SODIUM CHLORIDE 0.9 % IV BOLUS (SEPSIS)
1000.0000 mL | Freq: Once | INTRAVENOUS | Status: AC
  Administered 2016-01-27: 1000 mL via INTRAVENOUS

## 2016-01-27 MED ORDER — OXYCODONE-ACETAMINOPHEN 5-325 MG PO TABS: 1.0000 | ORAL_TABLET | Freq: Four times a day (QID) | ORAL | 0 refills | Status: DC | PRN

## 2016-01-27 MED ORDER — DIATRIZOATE MEGLUMINE & SODIUM 66-10 % PO SOLN
15.0000 mL | Freq: Once | ORAL | Status: AC
  Administered 2016-01-27: 15 mL via ORAL

## 2016-01-27 MED ORDER — ONDANSETRON HCL 4 MG/2ML IJ SOLN
4.0000 mg | Freq: Once | INTRAMUSCULAR | Status: AC
  Administered 2016-01-27: 4 mg via INTRAVENOUS

## 2016-01-27 MED ORDER — METOCLOPRAMIDE HCL 5 MG/ML IJ SOLN
INTRAMUSCULAR | Status: AC
  Administered 2016-01-27: 10 mg via INTRAVENOUS
  Filled 2016-01-27: qty 2

## 2016-01-27 MED ORDER — METOCLOPRAMIDE HCL 5 MG/ML IJ SOLN
10.0000 mg | Freq: Once | INTRAMUSCULAR | Status: AC
  Administered 2016-01-27: 10 mg via INTRAVENOUS

## 2016-01-27 MED ORDER — HYDROMORPHONE HCL 1 MG/ML IJ SOLN
1.0000 mg | Freq: Once | INTRAMUSCULAR | Status: DC
  Filled 2016-01-27: qty 1

## 2016-01-27 MED ORDER — IOPAMIDOL (ISOVUE-300) INJECTION 61%
100.0000 mL | Freq: Once | INTRAVENOUS | Status: AC | PRN
  Administered 2016-01-27: 100 mL via INTRAVENOUS

## 2016-01-27 MED ORDER — SUCRALFATE 1 G PO TABS: 1.0000 g | ORAL_TABLET | Freq: Two times a day (BID) | ORAL | 0 refills | Status: DC

## 2016-01-27 NOTE — ED Notes (Signed)
Patient transported to CT 

## 2016-01-27 NOTE — ED Notes (Signed)
Lab notified again to stick pt.

## 2016-01-27 NOTE — ED Notes (Signed)
Assisted pt to the BR  - pt placed back on monitor   NAD assessed

## 2016-01-27 NOTE — ED Notes (Signed)
Pt 81% on room air after given dilaudid; pt instructed on deep breathing exercises and placed on 2L nasal cannula to maintain sat >92%.

## 2016-01-27 NOTE — ED Notes (Signed)
Able to get IV access but unable to obtain blood specimens.  Lab notified to stick pt.

## 2016-02-05 ENCOUNTER — Encounter: Payer: Self-pay | Admitting: Pain Medicine

## 2016-02-05 ENCOUNTER — Ambulatory Visit: Payer: Medicare Other | Attending: Pain Medicine | Admitting: Pain Medicine

## 2016-02-05 VITALS — BP 145/94 | HR 70 | Temp 98.5°F | Ht 64.0 in | Wt 285.0 lb

## 2016-02-05 DIAGNOSIS — M170 Bilateral primary osteoarthritis of knee: Secondary | ICD-10-CM | POA: Diagnosis not present

## 2016-02-05 DIAGNOSIS — M5136 Other intervertebral disc degeneration, lumbar region: Secondary | ICD-10-CM | POA: Insufficient documentation

## 2016-02-05 DIAGNOSIS — M5481 Occipital neuralgia: Secondary | ICD-10-CM | POA: Diagnosis not present

## 2016-02-05 DIAGNOSIS — E1140 Type 2 diabetes mellitus with diabetic neuropathy, unspecified: Secondary | ICD-10-CM | POA: Insufficient documentation

## 2016-02-05 DIAGNOSIS — Z9889 Other specified postprocedural states: Secondary | ICD-10-CM | POA: Diagnosis not present

## 2016-02-05 DIAGNOSIS — M47816 Spondylosis without myelopathy or radiculopathy, lumbar region: Secondary | ICD-10-CM | POA: Diagnosis not present

## 2016-02-05 DIAGNOSIS — M545 Low back pain: Secondary | ICD-10-CM | POA: Diagnosis present

## 2016-02-05 DIAGNOSIS — M79604 Pain in right leg: Secondary | ICD-10-CM | POA: Diagnosis present

## 2016-02-05 DIAGNOSIS — M79605 Pain in left leg: Secondary | ICD-10-CM | POA: Diagnosis present

## 2016-02-05 DIAGNOSIS — G43009 Migraine without aura, not intractable, without status migrainosus: Secondary | ICD-10-CM

## 2016-02-05 DIAGNOSIS — E1340 Other specified diabetes mellitus with diabetic neuropathy, unspecified: Secondary | ICD-10-CM

## 2016-02-05 MED ORDER — TIZANIDINE HCL 4 MG PO TABS: ORAL_TABLET | ORAL | 0 refills | Status: DC

## 2016-02-05 MED ORDER — OXYCODONE HCL 10 MG PO TABS: ORAL_TABLET | ORAL | 0 refills | Status: DC

## 2016-02-05 NOTE — Progress Notes (Signed)
Safety precautions to be maintained throughout the outpatient stay will include: orient to surroundings, keep bed in low position, maintain call bell within reach at all times, provide assistance with transfer out of bed and ambulation. On 14th went to er  For vomiting . Ct scan of stomach had gastritis.

## 2016-02-05 NOTE — Progress Notes (Signed)
The patient is a 50 year old female who returns to pain management for further evaluation and treatment of pain involving the neck as well as the upper mid lower back and lower extremity regions. The patient is with pain radiating from the neck continuing to the back of the head and to the retro-orbital region producing headaches. The patient also is with pain of the upper back mid back lower back region and knees of significant degree. The patient is contemplating surgery for weight reduction at this time. We discussed patient's condition and will continue Zanaflex and oxycodone as discussed. We discussed interventional treatment which we will avoid due to patient's underlying medical conditions. Patient will undergo follow-up surgical evaluation to consider surgery for weight reduction and we'll continue under the care of her treating physician Dr. Sampson GoonFitzgerald for evaluation and treatment of her general medical condition. The patient was with understanding and in agreement with suggested treatment plan.    Physical examination  There was moderate tenderness of the splenius capitis and occipitalis region palpation which reproduces moderate discomfort. Palpation of the cervical facet cervical paraspinal musculature region was of increased pain of moderate degree. No new masses of the head and neck were noted. There were no bounding pulsations of the temporal region noted. Palpation of the splenius capitis and occipitalis region reproduced moderate to moderately severe pain on the left as well as on the right. There was unremarkable Spurling's maneuver. The patient appeared to be with bilaterally equal grip strength and Tinel and Phalen's maneuver reproducing mild discomfort. Palpation of the region of the thoracic area was with tenderness to palpation without crepitus of the thoracic region noted. Palpation of the lumbar paraspinal musculature region lumbar facet region was with tenderness to palpation of  moderate to moderately severe degree. There was tenderness of the region of the PSIS and PII S region of moderate to moderately severe degree as well. Palpation of the greater trochanteric region iliotibial band region reproduced moderately severe discomfort. The knees were with well-healed scars of the knees with negative anterior and posterior drawer signs without ballottement of the patella. There was moderate to moderately severe increase of pain of the knees with range of motion maneuvers of the knees. Straight leg raising was tolerates approximately 20 without increased pain with dorsiflexion noted. EHL strength appeared to be decreased. No sensory deficit or dermatomal distribution detected. There was negative clonus negative Homans. Abdomen was protuberant with mild tenderness to palpation with no costovertebral angle tenderness noted.     Assessment    Degenerative disc disease of the lumbar spine Multilevel degenerative changes lumbar spine with L4-L5 level involving predominantly  Degenerative joint disease of knees Status post surgery of knees  Diabetes mellitus Diabetic neuropathy  Bilateral occipital neuralgia  Status post recent abdominal surgery  Morbid obesity      PLAN   Continue present medications Zanaflex and oxycodone  Apply Voltaren gel applications to the knee as discussed   F/U PCP Dr. Sampson GoonFitzgerald for evaliation of  BP diabetes mellitus and general medical condition.    F/U surgical evaluation Prior  surgery was [facility]Patient will follow-up with his colleague Dr. Annia BeltZadiq as discussed to consider gastric sleeve surgery Follow-up with Dr. Delbert HarnessMurphy Wainer regarding knees and orthopedic condition as discussed  F/U neurological evaluation as discussed  F/U Dr. Janeece RiggersSu for continued evaluation and treatment and continue to address Xanax as we discussed  May consider radiofrequency rhizolysis or intraspinal procedures pending  response to present  treatment and F/U evaluation . We we will avoid such procedures at this time   Patient to call Pain Management Center should patient have concerns prior to scheduled return appointment.

## 2016-02-05 NOTE — Patient Instructions (Addendum)
PLAN   Continue present medications Zanaflex and oxycodone  Apply Voltaren gel applications to the knee as discussed   F/U PCP Dr. Sampson GoonFitzgerald for evaliation of  BP diabetes mellitus and general medical condition.    F/U surgical evaluation Prior  [facility]Patient will follow-up with his colleague Dr. Annia BeltZadiq as discussed to consider gastric sleeve surgery Follow-up with Dr. Delbert HarnessMurphy Wainer regarding knees and orthopedic condition as discussed  F/U neurological evaluation as discussed  F/U Dr. Janeece RiggersSu for continued evaluation and treatment and continue to address Xanax as we discussed  May consider radiofrequency rhizolysis or intraspinal procedures pending response to present treatment and F/U evaluation . We we will avoid such procedures at this time   Patient to call Pain Management Center should patient have concerns prior to scheduled return appointment.Pain Management Discharge Instructions  General Discharge Instructions :  If you need to reach your doctor call: Monday-Friday 8:00 am - 4:00 pm at [phone] or toll free [phone].  After clinic hours [phone] to have operator reach doctor.  Bring all of your medication bottles to all your appointments in the pain clinic.  To cancel or reschedule your appointment with Pain Management please remember to call 24 hours in advance to avoid a fee.  Refer to the educational materials which you have been given on: General Risks, I had my Procedure. Discharge Instructions, Post Sedation.  Post Procedure Instructions:  The drugs you were given will stay in your system until tomorrow, so for the next 24 hours you should not drive, make any legal decisions or drink any alcoholic beverages.  You may eat anything you prefer, but it is better to start with liquids then soups and crackers, and gradually work up to solid foods.  Please notify your doctor immediately if you have any unusual  bleeding, trouble breathing or pain that is not related to your normal pain.  Depending on the type of procedure that was done, some parts of your body may feel week and/or numb.  This usually clears up by tonight or the next day.  Walk with the use of an assistive device or accompanied by an adult for the 24 hours.  You may use ice on the affected area for the first 24 hours.  Put ice in a Ziploc bag and cover with a towel and place against area 15 minutes on 15 minutes off.  You may switch to heat after 24 hours.

## 2016-02-19 ENCOUNTER — Telehealth: Payer: Self-pay | Admitting: *Deleted

## 2016-03-07 ENCOUNTER — Other Ambulatory Visit: Payer: Self-pay | Admitting: Pain Medicine

## 2016-03-08 ENCOUNTER — Other Ambulatory Visit: Payer: Self-pay | Admitting: Pain Medicine

## 2016-03-09 ENCOUNTER — Ambulatory Visit: Payer: Medicare Other | Admitting: Pain Medicine

## 2016-03-12 ENCOUNTER — Emergency Department: Payer: Medicare Other

## 2016-03-12 ENCOUNTER — Emergency Department
Admission: EM | Admit: 2016-03-12 | Discharge: 2016-03-12 | Disposition: A | Discharge status: Home / Self Care | Payer: Medicare Other | Attending: Student in an Organized Health Care Education/Training Program | Admitting: Student in an Organized Health Care Education/Training Program

## 2016-03-12 DIAGNOSIS — R112 Nausea with vomiting, unspecified: Secondary | ICD-10-CM | POA: Insufficient documentation

## 2016-03-12 DIAGNOSIS — R509 Fever, unspecified: Secondary | ICD-10-CM | POA: Diagnosis not present

## 2016-03-12 DIAGNOSIS — R1013 Epigastric pain: Secondary | ICD-10-CM | POA: Insufficient documentation

## 2016-03-12 DIAGNOSIS — I10 Essential (primary) hypertension: Secondary | ICD-10-CM | POA: Diagnosis not present

## 2016-03-12 DIAGNOSIS — Z79899 Other long term (current) drug therapy: Secondary | ICD-10-CM | POA: Diagnosis not present

## 2016-03-12 DIAGNOSIS — E119 Type 2 diabetes mellitus without complications: Secondary | ICD-10-CM | POA: Diagnosis not present

## 2016-03-12 DIAGNOSIS — Z7984 Long term (current) use of oral hypoglycemic drugs: Secondary | ICD-10-CM | POA: Insufficient documentation

## 2016-03-12 LAB — LIPASE, BLOOD: Lipase: 18 U/L (ref 11–51)

## 2016-03-12 LAB — CBC
HEMATOCRIT: 43.4 % (ref 35.0–47.0)
HEMOGLOBIN: 14.6 g/dL (ref 12.0–16.0)
MCH: 27.9 pg (ref 26.0–34.0)
MCHC: 33.6 g/dL (ref 32.0–36.0)
MCV: 83.1 fL (ref 80.0–100.0)
Platelets: 378 10*3/uL (ref 150–440)
RBC: 5.23 MIL/uL — AB (ref 3.80–5.20)
RDW: 18.2 % — ABNORMAL HIGH (ref 11.5–14.5)
WBC: 11 10*3/uL (ref 3.6–11.0)

## 2016-03-12 LAB — COMPREHENSIVE METABOLIC PANEL
ALBUMIN: 4.4 g/dL (ref 3.5–5.0)
ALK PHOS: 70 U/L (ref 38–126)
ALT: 22 U/L (ref 14–54)
ANION GAP: 9 (ref 5–15)
AST: 26 U/L (ref 15–41)
BILIRUBIN TOTAL: 0.7 mg/dL (ref 0.3–1.2)
BUN: 7 mg/dL (ref 6–20)
CALCIUM: 9.2 mg/dL (ref 8.9–10.3)
CO2: 28 mmol/L (ref 22–32)
Chloride: 101 mmol/L (ref 101–111)
Creatinine, Ser: 0.8 mg/dL (ref 0.44–1.00)
GFR calc non Af Amer: >60 mL/min (ref >60)
GLUCOSE: 128 mg/dL — AB (ref 65–99)
POTASSIUM: 3.7 mmol/L (ref 3.5–5.1)
Sodium: 138 mmol/L (ref 135–145)
TOTAL PROTEIN: 8.6 g/dL — AB (ref 6.5–8.1)

## 2016-03-12 LAB — GLUCOSE, CAPILLARY: GLUCOSE-CAPILLARY: 137 mg/dL — AB (ref 65–99)

## 2016-03-12 MED ORDER — HYDROMORPHONE HCL 1 MG/ML IJ SOLN
1.0000 mg | Freq: Once | INTRAMUSCULAR | Status: AC
  Administered 2016-03-12: 1 mg via INTRAVENOUS
  Filled 2016-03-12: qty 1

## 2016-03-12 MED ORDER — HYDRALAZINE HCL 20 MG/ML IJ SOLN
10.0000 mg | Freq: Once | INTRAMUSCULAR | Status: AC
  Administered 2016-03-12: 10 mg via INTRAVENOUS
  Filled 2016-03-12: qty 1

## 2016-03-12 MED ORDER — ONDANSETRON HCL 4 MG/2ML IJ SOLN
4.0000 mg | Freq: Once | INTRAMUSCULAR | Status: AC | PRN
  Administered 2016-03-12: 4 mg via INTRAVENOUS
  Filled 2016-03-12: qty 2

## 2016-03-12 MED ORDER — METOPROLOL TARTRATE 25 MG PO TABS
25.0000 mg | ORAL_TABLET | Freq: Once | ORAL | Status: AC
  Administered 2016-03-12: 25 mg via ORAL
  Filled 2016-03-12: qty 1

## 2016-03-12 MED ORDER — SODIUM CHLORIDE 0.9 % IV BOLUS (SEPSIS)
1000.0000 mL | Freq: Once | INTRAVENOUS | Status: AC
  Administered 2016-03-12: 1000 mL via INTRAVENOUS

## 2016-03-12 MED ORDER — GI COCKTAIL ~~LOC~~
30.0000 mL | Freq: Once | ORAL | Status: AC
  Administered 2016-03-12: 30 mL via ORAL
  Filled 2016-03-12: qty 30

## 2016-03-12 MED ORDER — PROMETHAZINE HCL 12.5 MG PO TABS: 12.5000 mg | ORAL_TABLET | Freq: Four times a day (QID) | ORAL | 0 refills | Status: DC | PRN

## 2016-03-12 MED ORDER — DICYCLOMINE HCL 20 MG PO TABS: 20.0000 mg | ORAL_TABLET | Freq: Three times a day (TID) | ORAL | 0 refills | Status: DC | PRN

## 2016-03-12 MED ORDER — PROMETHAZINE HCL 25 MG/ML IJ SOLN
12.5000 mg | Freq: Once | INTRAMUSCULAR | Status: AC
  Administered 2016-03-12: 12.5 mg via INTRAVENOUS
  Filled 2016-03-12: qty 1

## 2016-03-12 MED ORDER — MORPHINE SULFATE (PF) 4 MG/ML IV SOLN: 4.0000 mg | INTRAVENOUS | Status: DC | PRN

## 2016-03-12 MED ORDER — RANITIDINE HCL 150 MG PO TABS: 150.0000 mg | ORAL_TABLET | Freq: Every day | ORAL | 1 refills | Status: DC

## 2016-03-12 NOTE — ED Notes (Signed)
Dr. Roxan Hockeyobinson is aware of pt's BP being elevated. Pt stating that she vomited up her BP medications today.

## 2016-03-12 NOTE — ED Notes (Addendum)
Dora SimsSonjia, RN and Dorinda Hillonald, RN attempted IV start on pt. Pt was missed both times. Nurse is notifying Dr. Roxan Hockeyobinson.

## 2016-03-12 NOTE — ED Notes (Signed)
Pt stating that she is feeling better. Pt was sleeping on nurse's arrival to room. Pt VS are WNL. Pt was able to tolerate PO fluids.

## 2016-03-12 NOTE — ED Notes (Signed)
Dr. Roxan Hockeyobinson is aware that pt not having IV access. Ultrasound placed at bedside with IV cart.

## 2016-03-12 NOTE — ED Provider Notes (Signed)
[facility] Emergency Department Provider Note    First MD Initiated Contact with Patient 03/12/16 1545     (approximate)  I have reviewed the triage vital signs and the nursing notes.   HISTORY  Chief Complaint Emesis and Abdominal Pain    HPI Daisy [name] is a 50 y.o. female presents with 1 day of nausea vomiting and epigastric pain. States she felt chills and illness morning. Tried to eat breakfast but had several episodes of nonbilious nonbloody vomiting. Has not moved her bowels. States she's had several episodes of this in the past. Denies any chest pain or shortness of breath.   Past Medical History:  Diagnosis Date  . Anxiety   . Depression   . Diabetes mellitus without complication (HCC)   . GERD (gastroesophageal reflux disease)   . Hypertension   . Insomnia   . Migraine   . Obesity     Patient Active Problem List   Diagnosis Date Noted  . Hypokalemia 09/28/2015  . Gastroenteritis 09/28/2015  . Intractable abdominal pain 05/05/2015  . Gastritis 01/07/2015  . DDD (degenerative disc disease), lumbar 11/28/2014  . DJD (degenerative joint disease) of knee 11/28/2014  . Migraine headache 11/28/2014  . Bilateral occipital neuralgia 11/28/2014  . Morbid obesity (HCC) 11/28/2014  . Neuropathy due to secondary diabetes (HCC) 11/28/2014    Past Surgical History:  Procedure Laterality Date  . ABDOMINAL HYSTERECTOMY    . APPENDECTOMY    . APPLICATION OF WOUND VAC    . CHOLECYSTECTOMY    . HERNIA REPAIR    . KNEE SURGERY      Prior to Admission medications   Medication Sig Start Date End Date Taking? Authorizing Provider  alprazolam Prudy Feeler(XANAX) 2 MG tablet Take 2 mg by mouth 3 (three) times daily as needed for anxiety.     Historical Provider, MD  diclofenac sodium (VOLTAREN) 1 % GEL Apply 2-4 g to painful areas of the skin 4 times per day if tolerated. ONLY APPLY A MAXIMUM OF 4 GRAMS PER APPLICATION Patient not taking: Reported on  02/05/2016 09/18/15   Ewing SchleinGregory Crisp, MD  dicyclomine (BENTYL) 20 MG tablet Take 1 tablet (20 mg total) by mouth 3 (three) times daily as needed for spasms. 03/12/16   Willy EddyPatrick Ann-Marie Kluge, MD  FLUoxetine (PROZAC) 20 MG capsule Take 20 mg by mouth daily.    Historical Provider, MD  furosemide (LASIX) 20 MG tablet Take 20 mg by mouth daily.    Historical Provider, MD  lamoTRIgine (LAMICTAL) 100 MG tablet Take 100 mg by mouth daily.    Historical Provider, MD  lisinopril (PRINIVIL,ZESTRIL) 20 MG tablet Take 20 mg by mouth daily.    Historical Provider, MD  metFORMIN (GLUCOPHAGE) 500 MG tablet Take 500 mg by mouth daily.    Historical Provider, MD  metoCLOPramide (REGLAN) 10 MG tablet Take 1 tablet (10 mg total) by mouth every 8 (eight) hours as needed. 01/27/16   Rebecka ApleyAllison P Webster, MD  metoprolol succinate (TOPROL-XL) 25 MG 24 hr tablet Take 50 mg by mouth daily.    Historical Provider, MD  nortriptyline (PAMELOR) 25 MG capsule Take 25 mg by mouth 2 (two) times daily as needed (for anxiety). Reported on 12/11/2015    Historical Provider, MD  ondansetron (ZOFRAN) 4 MG tablet Take 1 tablet (4 mg total) by mouth every 8 (eight) hours as needed for nausea or vomiting. 09/25/15   Jennye MoccasinBrian S Quigley, MD  Oxycodone HCl 10 MG TABS Limit  4 -  7 tabs by mouth per day if tolerated 02/05/16   Ewing Schlein, MD  pantoprazole (PROTONIX) 40 MG tablet Take 40 mg by mouth 2 (two) times daily.     Historical Provider, MD  polyethylene glycol (MIRALAX / GLYCOLAX) packet Take 17 g by mouth daily as needed for mild constipation. 05/09/15   Delfino Lovett, MD  potassium chloride (KCL) 2 mEq/mL SOLN oral liquid Take by mouth 2 (two) times daily as needed (takes every other day with lasix).    Historical Provider, MD  potassium chloride SA (K-DUR,KLOR-CON) 20 MEQ tablet Take 20 mEq by mouth daily.    Historical Provider, MD  promethazine (PHENERGAN) 12.5 MG suppository Place 1 suppository (12.5 mg total) rectally every 6 (six) hours as needed  for nausea or vomiting. Patient not taking: Reported on 02/05/2016 09/25/15   Jennye Moccasin, MD  promethazine (PHENERGAN) 12.5 MG tablet Take 1 tablet (12.5 mg total) by mouth every 6 (six) hours as needed for nausea or vomiting. Patient not taking: Reported on 02/05/2016 09/25/15   Jennye Moccasin, MD  promethazine (PHENERGAN) 12.5 MG tablet Take 1 tablet (12.5 mg total) by mouth every 6 (six) hours as needed for nausea or vomiting. 03/12/16   Willy Eddy, MD  ranitidine (ZANTAC) 150 MG tablet Take 1 tablet (150 mg total) by mouth at bedtime. 03/12/16 03/12/17  Willy Eddy, MD  sucralfate (CARAFATE) 1 G tablet Take 1 tablet (1 g total) by mouth 4 (four) times daily. Patient not taking: Reported on 02/05/2016 05/09/15   Delfino Lovett, MD  sucralfate (CARAFATE) 1 g tablet Take 1 tablet (1 g total) by mouth 2 (two) times daily. 01/27/16   Rebecka Apley, MD  tiZANidine (ZANAFLEX) 4 MG tablet Limit 1-3 tabs by mouth per day if tolerated 02/05/16   Ewing Schlein, MD  traZODone (DESYREL) 100 MG tablet Take 100 mg by mouth at bedtime.    Historical Provider, MD  zolpidem (AMBIEN CR) 12.5 MG CR tablet Take 12.5 mg by mouth at bedtime as needed for sleep.    Historical Provider, MD    Allergies Acetaminophen-codeine; Codeine; Ibuprofen; Morphine; Propoxyphene; Sulfa antibiotics; and Duloxetine  Family History  Problem Relation Age of Onset  . Arthritis Mother   . Depression Mother   . Diabetes Mother   . Heart disease Mother   . Hyperlipidemia Mother   . Hypertension Mother   . Stroke Mother   . Arthritis Father   . Diabetes Father   . Hearing loss Father   . Heart disease Father   . Hyperlipidemia Father   . Hypertension Father     Social History Social History  Substance Use Topics  . Smoking status: Never Smoker  . Smokeless tobacco: Never Used  . Alcohol use 0.0 oz/week     Comment: occasional    Review of Systems Patient denies headaches, rhinorrhea, blurry vision,  numbness, shortness of breath, chest pain, edema, cough, abdominal pain, nausea, vomiting, diarrhea, dysuria, fevers, rashes or hallucinations unless otherwise stated above in HPI. ____________________________________________   PHYSICAL EXAM:  VITAL SIGNS: Vitals:   03/12/16 2103 03/12/16 2130  BP:  125/90  Pulse:  (!) 107  Resp:  20  Temp: 98.7 F (37.1 C)     Constitutional: Alert and oriented. Obese female in no acute distress Eyes: Conjunctivae are normal. PERRL. EOMI. Head: Atraumatic. Nose: No congestion/rhinnorhea. Mouth/Throat: Mucous membranes are moist.  Oropharynx non-erythematous. Neck: No stridor. Painless ROM. No cervical spine tenderness to palpation Hematological/Lymphatic/Immunilogical: No cervical lymphadenopathy.  Cardiovascular: Normal rate, regular rhythm. Grossly normal heart sounds.  Good peripheral circulation. Respiratory: Normal respiratory effort.  No retractions. Lungs CTAB. Gastrointestinal: Obese without peritonitis. Mildly tender to palpation the epigastrium without rebound or guarding. No distention. No abdominal bruits. No CVA tenderness. Genitourinary:  Musculoskeletal: No lower extremity tenderness nor edema.  No joint effusions. Neurologic:  Normal speech and language. No gross focal neurologic deficits are appreciated. No gait instability. Skin:  Skin is warm, dry and intact. No rash noted. Psychiatric: Mood and affect are normal. Speech and behavior are normal.  ____________________________________________   LABS (all labs ordered are listed, but only abnormal results are displayed)  Results for orders placed or performed during the hospital encounter of 03/12/16 (from the past 24 hour(s))  Lipase, blood     Status: None   Collection Time: 03/12/16  2:42 PM  Result Value Ref Range   Lipase 18 11 - 51 U/L  Comprehensive metabolic panel     Status: Abnormal   Collection Time: 03/12/16  2:42 PM  Result Value Ref Range   Sodium 138 135 -  145 mmol/L   Potassium 3.7 3.5 - 5.1 mmol/L   Chloride 101 101 - 111 mmol/L   CO2 28 22 - 32 mmol/L   Glucose, Bld 128 (H) 65 - 99 mg/dL   BUN 7 6 - 20 mg/dL   Creatinine, Ser 1.61 0.44 - 1.00 mg/dL   Calcium 9.2 8.9 - 09.6 mg/dL   Total Protein 8.6 (H) 6.5 - 8.1 g/dL   Albumin 4.4 3.5 - 5.0 g/dL   AST 26 15 - 41 U/L   ALT 22 14 - 54 U/L   Alkaline Phosphatase 70 38 - 126 U/L   Total Bilirubin 0.7 0.3 - 1.2 mg/dL   GFR calc non Af Amer >60 >60 mL/min   GFR calc Af Amer >60 >60 mL/min   Anion gap 9 5 - 15  CBC     Status: Abnormal   Collection Time: 03/12/16  2:42 PM  Result Value Ref Range   WBC 11.0 3.6 - 11.0 K/uL   RBC 5.23 (H) 3.80 - 5.20 MIL/uL   Hemoglobin 14.6 12.0 - 16.0 g/dL   HCT 04.5 40.9 - 81.1 %   MCV 83.1 80.0 - 100.0 fL   MCH 27.9 26.0 - 34.0 pg   MCHC 33.6 32.0 - 36.0 g/dL   RDW 91.4 (H) 78.2 - 95.6 %   Platelets 378 150 - 440 K/uL  Glucose, capillary     Status: Abnormal   Collection Time: 03/12/16  3:07 PM  Result Value Ref Range   Glucose-Capillary 137 (H) 65 - 99 mg/dL   ____________________________________________ ____________________________________________  RADIOLOGY  I personally reviewed all radiographic images ordered to evaluate for the above acute complaints and reviewed radiology reports and findings.  These findings were personally discussed with the patient.  Please see medical record for radiology report. ____________________________________________   PROCEDURES  Procedure(s) performed: none    Critical Care performed: no ____________________________________________   INITIAL IMPRESSION / ASSESSMENT AND PLAN / ED COURSE  Pertinent labs & imaging results that were available during my care of the patient were reviewed by me and considered in my medical decision making (see chart for details).  DDX: Gastritis, pancreatitis, hepatitis, acute gastroenteritis, obstruction  Daisy [name] is a 50 y.o. who presents to the ED with  epigastric discomfort and nausea and vomiting. No diarrhea. Patient did have low-grade fever of report of family members state that there  were sick as well. Exam is suggestive of acute gastritis. She is status post cholecystectomy and appendectomy. Vital signs are otherwise stable except for hypertension as the patient did not take her blood pressure medications this morning. We'll give IV fluids for hydration as well as IV pain medication and IV antiemetics. I do not feel that CT imaging clinically indicated at this time based on her exam.  The patient will be placed on continuous pulse oximetry and telemetry for monitoring.  Laboratory evaluation will be sent to evaluate for the above complaints.     Clinical Course  Comment By Time  Repeat abdominal exam soft and benign. Patient had some improvement after IV fluids and antiemetics. We'll give GI cocktail. Willy Eddy, MD 09/29 2014  Patient feels better after GI cocktail. Does have mild tachycardia and I do feel is likely reflux tachycardia after hydralazine. Patient given her home metoprolol with improvement.  Patient currently tolerating oral hydration states she feels much better.Willy Eddy, MD 09/29 2101  Patient remains afebrile. Repeat abdominal exam is reassuring. Laboratory evaluation reassuring. Patient able to tolerate oral hydration. Do not feel emergent imaging clinically indicated at this time she's had multiple CTs over the past year. Discussed follow-up with primary care physician and return to ER in 12 hours for repeat abdominal exam his symptoms not improved.  Have discussed with the patient and available family all diagnostics and treatments performed thus far and all questions were answered to the best of my ability. The patient demonstrates understanding and agreement with plan.  Willy Eddy, MD 09/29 2146     ____________________________________________   FINAL CLINICAL IMPRESSION(S) / ED DIAGNOSES  Final  diagnoses:  Non-intractable vomiting with nausea, vomiting of unspecified type      NEW MEDICATIONS STARTED DURING THIS VISIT:  New Prescriptions   DICYCLOMINE (BENTYL) 20 MG TABLET    Take 1 tablet (20 mg total) by mouth 3 (three) times daily as needed for spasms.   PROMETHAZINE (PHENERGAN) 12.5 MG TABLET    Take 1 tablet (12.5 mg total) by mouth every 6 (six) hours as needed for nausea or vomiting.   RANITIDINE (ZANTAC) 150 MG TABLET    Take 1 tablet (150 mg total) by mouth at bedtime.     Note:  This document was prepared using Dragon voice recognition software and may include unintentional dictation errors.    Willy Eddy, MD 03/12/16 2151

## 2016-03-12 NOTE — ED Notes (Addendum)
Pt stating that she woke up from sleep with abdominal pain and n/v. Pt stating that she has had chills for the last few days but unsure if she has had a fever. Pt stating she "feels constipated." Pt's last BM was yesterday. Pt last emesis was after arrival to room. Pt is unsure of how many times she has vomited today.

## 2016-03-12 NOTE — ED Notes (Signed)
Nurse assisted pt in calling ride. Pt's ride is on the way.

## 2016-03-12 NOTE — ED Notes (Signed)
Attempted x3 to start pt's IV with no success. Nurse will have another nurse attempt.

## 2016-03-12 NOTE — ED Triage Notes (Signed)
Vomiting since this AM, lower abdominal pain. Emesis X 5. Pt alert and oriented X4, active, cooperative, pt in NAD. RR even and unlabored, color WNL.

## 2016-03-21 ENCOUNTER — Emergency Department: Payer: Medicare Other

## 2016-03-21 ENCOUNTER — Emergency Department
Admission: EM | Admit: 2016-03-21 | Discharge: 2016-03-22 | Disposition: A | Discharge status: Home / Self Care | Payer: Medicare Other | Source: Home / Self Care | Attending: Emergency Medicine | Admitting: Emergency Medicine

## 2016-03-21 ENCOUNTER — Encounter: Payer: Self-pay | Admitting: Emergency Medicine

## 2016-03-21 DIAGNOSIS — R197 Diarrhea, unspecified: Principal | ICD-10-CM

## 2016-03-21 DIAGNOSIS — R1084 Generalized abdominal pain: Secondary | ICD-10-CM | POA: Insufficient documentation

## 2016-03-21 DIAGNOSIS — E119 Type 2 diabetes mellitus without complications: Secondary | ICD-10-CM | POA: Insufficient documentation

## 2016-03-21 DIAGNOSIS — Z7984 Long term (current) use of oral hypoglycemic drugs: Secondary | ICD-10-CM

## 2016-03-21 DIAGNOSIS — Z79899 Other long term (current) drug therapy: Secondary | ICD-10-CM | POA: Insufficient documentation

## 2016-03-21 DIAGNOSIS — Z6841 Body Mass Index (BMI) 40.0 and over, adult: Secondary | ICD-10-CM | POA: Insufficient documentation

## 2016-03-21 DIAGNOSIS — K219 Gastro-esophageal reflux disease without esophagitis: Secondary | ICD-10-CM | POA: Insufficient documentation

## 2016-03-21 DIAGNOSIS — Z885 Allergy status to narcotic agent status: Secondary | ICD-10-CM | POA: Diagnosis not present

## 2016-03-21 DIAGNOSIS — E1143 Type 2 diabetes mellitus with diabetic autonomic (poly)neuropathy: Principal | ICD-10-CM | POA: Insufficient documentation

## 2016-03-21 DIAGNOSIS — G43909 Migraine, unspecified, not intractable, without status migrainosus: Secondary | ICD-10-CM | POA: Insufficient documentation

## 2016-03-21 DIAGNOSIS — I10 Essential (primary) hypertension: Secondary | ICD-10-CM | POA: Insufficient documentation

## 2016-03-21 DIAGNOSIS — F419 Anxiety disorder, unspecified: Secondary | ICD-10-CM | POA: Diagnosis not present

## 2016-03-21 DIAGNOSIS — F329 Major depressive disorder, single episode, unspecified: Secondary | ICD-10-CM | POA: Diagnosis not present

## 2016-03-21 DIAGNOSIS — Z9049 Acquired absence of other specified parts of digestive tract: Secondary | ICD-10-CM | POA: Insufficient documentation

## 2016-03-21 DIAGNOSIS — Z8249 Family history of ischemic heart disease and other diseases of the circulatory system: Secondary | ICD-10-CM | POA: Insufficient documentation

## 2016-03-21 DIAGNOSIS — R112 Nausea with vomiting, unspecified: Secondary | ICD-10-CM | POA: Insufficient documentation

## 2016-03-21 DIAGNOSIS — I160 Hypertensive urgency: Secondary | ICD-10-CM | POA: Diagnosis not present

## 2016-03-21 DIAGNOSIS — K3184 Gastroparesis: Secondary | ICD-10-CM | POA: Insufficient documentation

## 2016-03-21 DIAGNOSIS — R109 Unspecified abdominal pain: Secondary | ICD-10-CM

## 2016-03-21 LAB — COMPREHENSIVE METABOLIC PANEL
ALK PHOS: 66 U/L (ref 38–126)
ALT: 15 U/L (ref 14–54)
AST: 17 U/L (ref 15–41)
Albumin: 4.8 g/dL (ref 3.5–5.0)
Anion gap: 13 (ref 5–15)
BUN: 7 mg/dL (ref 6–20)
CALCIUM: 9.5 mg/dL (ref 8.9–10.3)
CHLORIDE: 102 mmol/L (ref 101–111)
CO2: 23 mmol/L (ref 22–32)
CREATININE: 0.86 mg/dL (ref 0.44–1.00)
GFR calc Af Amer: >60 mL/min (ref >60)
Glucose, Bld: 129 mg/dL — ABNORMAL HIGH (ref 65–99)
Potassium: 4.2 mmol/L (ref 3.5–5.1)
Sodium: 138 mmol/L (ref 135–145)
Total Bilirubin: 1 mg/dL (ref 0.3–1.2)
Total Protein: 9.3 g/dL — ABNORMAL HIGH (ref 6.5–8.1)

## 2016-03-21 LAB — CBC WITH DIFFERENTIAL/PLATELET
Basophils Absolute: 0.1 10*3/uL (ref 0–0.1)
Basophils Relative: 1 %
EOS PCT: 1 %
Eosinophils Absolute: 0.1 10*3/uL (ref 0–0.7)
HCT: 47.4 % — ABNORMAL HIGH (ref 35.0–47.0)
Hemoglobin: 15.5 g/dL (ref 12.0–16.0)
LYMPHS ABS: 4.9 10*3/uL — AB (ref 1.0–3.6)
LYMPHS PCT: 42 %
MCH: 27.1 pg (ref 26.0–34.0)
MCHC: 32.7 g/dL (ref 32.0–36.0)
MCV: 82.8 fL (ref 80.0–100.0)
MONO ABS: 0.5 10*3/uL (ref 0.2–0.9)
Monocytes Relative: 4 %
Neutro Abs: 6.1 10*3/uL (ref 1.4–6.5)
Neutrophils Relative %: 52 %
PLATELETS: 354 10*3/uL (ref 150–440)
RBC: 5.72 MIL/uL — ABNORMAL HIGH (ref 3.80–5.20)
RDW: 17.6 % — AB (ref 11.5–14.5)
WBC: 11.7 10*3/uL — ABNORMAL HIGH (ref 3.6–11.0)

## 2016-03-21 LAB — TROPONIN I: Troponin I: <0.03 ng/mL (ref <0.03)

## 2016-03-21 LAB — LIPASE, BLOOD: LIPASE: 18 U/L (ref 11–51)

## 2016-03-21 MED ORDER — SODIUM CHLORIDE 0.9 % IV BOLUS (SEPSIS)
1000.0000 mL | Freq: Once | INTRAVENOUS | Status: AC
  Administered 2016-03-21: 1000 mL via INTRAVENOUS

## 2016-03-21 MED ORDER — HYDROMORPHONE HCL 1 MG/ML IJ SOLN
1.0000 mg | Freq: Once | INTRAMUSCULAR | Status: AC
Start: 2016-03-21 — End: 2016-03-21
  Administered 2016-03-21: 1 mg via INTRAVENOUS

## 2016-03-21 MED ORDER — ONDANSETRON HCL 4 MG/2ML IJ SOLN
4.0000 mg | Freq: Once | INTRAMUSCULAR | Status: AC
  Administered 2016-03-21: 4 mg via INTRAVENOUS
  Filled 2016-03-21: qty 2

## 2016-03-21 MED ORDER — HYDROMORPHONE HCL 1 MG/ML IJ SOLN
INTRAMUSCULAR | Status: AC
  Administered 2016-03-21: 1 mg via INTRAVENOUS
  Filled 2016-03-21: qty 1

## 2016-03-21 MED ORDER — HYDROMORPHONE HCL 1 MG/ML IJ SOLN
1.0000 mg | Freq: Once | INTRAMUSCULAR | Status: AC
Start: 2016-03-21 — End: 2016-03-21
  Administered 2016-03-21: 1 mg via INTRAVENOUS
  Filled 2016-03-21: qty 1

## 2016-03-21 NOTE — ED Provider Notes (Signed)
[facility] Emergency Department Provider Note ____________________________________________   I have reviewed the triage vital signs and the triage nursing note.  HISTORY  Chief Complaint Nausea and Emesis   Historian Patient  HPI Daisy [name] is a 50 y.o. female with a history of diabetes, GERD, obesity, and prior episodes of nausea vomiting and diarrhea for which her most recent ED visit was about 1 week ago. She states at that point time she was evaluated and treated and then was able to go home.  She states that home she's done okay. Until last night and today she's been vomiting numerous times, nonbloody and nonbilious. She has had 2 episodes of diarrhea this morning. She has mid abdominal pain. Denies fever. States that this is similar to other episodes. Symptoms are moderate and drinking and eating seems to make it worse.    Past Medical History:  Diagnosis Date  . Anxiety   . Depression   . Diabetes mellitus without complication (HCC)   . GERD (gastroesophageal reflux disease)   . Hypertension   . Insomnia   . Migraine   . Obesity     Patient Active Problem List   Diagnosis Date Noted  . Hypokalemia 09/28/2015  . Gastroenteritis 09/28/2015  . Intractable abdominal pain 05/05/2015  . Gastritis 01/07/2015  . DDD (degenerative disc disease), lumbar 11/28/2014  . DJD (degenerative joint disease) of knee 11/28/2014  . Migraine headache 11/28/2014  . Bilateral occipital neuralgia 11/28/2014  . Morbid obesity (HCC) 11/28/2014  . Neuropathy due to secondary diabetes (HCC) 11/28/2014    Past Surgical History:  Procedure Laterality Date  . ABDOMINAL HYSTERECTOMY    . APPENDECTOMY    . APPLICATION OF WOUND VAC    . CHOLECYSTECTOMY    . HERNIA REPAIR    . KNEE SURGERY      Prior to Admission medications   Medication Sig Start Date End Date Taking? Authorizing Provider  alprazolam Prudy Feeler) 2 MG tablet Take 2 mg by mouth 3 (three) times  daily as needed for anxiety.     Historical Provider, MD  diclofenac sodium (VOLTAREN) 1 % GEL Apply 2-4 g to painful areas of the skin 4 times per day if tolerated. ONLY APPLY A MAXIMUM OF 4 GRAMS PER APPLICATION Patient not taking: Reported on 02/05/2016 09/18/15   Ewing Schlein, MD  dicyclomine (BENTYL) 20 MG tablet Take 1 tablet (20 mg total) by mouth 3 (three) times daily as needed for spasms. 03/12/16   Willy Eddy, MD  FLUoxetine (PROZAC) 20 MG capsule Take 20 mg by mouth daily.    Historical Provider, MD  furosemide (LASIX) 20 MG tablet Take 20 mg by mouth daily.    Historical Provider, MD  lamoTRIgine (LAMICTAL) 100 MG tablet Take 100 mg by mouth daily.    Historical Provider, MD  lisinopril (PRINIVIL,ZESTRIL) 20 MG tablet Take 20 mg by mouth daily.    Historical Provider, MD  metFORMIN (GLUCOPHAGE) 500 MG tablet Take 500 mg by mouth daily.    Historical Provider, MD  metoCLOPramide (REGLAN) 10 MG tablet Take 1 tablet (10 mg total) by mouth every 8 (eight) hours as needed. 01/27/16   Rebecka Apley, MD  metoprolol succinate (TOPROL-XL) 25 MG 24 hr tablet Take 50 mg by mouth daily.    Historical Provider, MD  nortriptyline (PAMELOR) 25 MG capsule Take 25 mg by mouth 2 (two) times daily as needed (for anxiety). Reported on 12/11/2015    Historical Provider, MD  ondansetron [facility]) 4  MG tablet Take 1 tablet (4 mg total) by mouth every 8 (eight) hours as needed for nausea or vomiting. 09/25/15   Jennye MoccasinBrian S Quigley, MD  Oxycodone HCl 10 MG TABS Limit  4 - 7 tabs by mouth per day if tolerated 02/05/16   Ewing SchleinGregory Crisp, MD  pantoprazole (PROTONIX) 40 MG tablet Take 40 mg by mouth 2 (two) times daily.     Historical Provider, MD  polyethylene glycol (MIRALAX / GLYCOLAX) packet Take 17 g by mouth daily as needed for mild constipation. 05/09/15   Delfino LovettVipul Shah, MD  potassium chloride (KCL) 2 mEq/mL SOLN oral liquid Take by mouth 2 (two) times daily as needed (takes every other day with lasix).    Historical  Provider, MD  potassium chloride SA (K-DUR,KLOR-CON) 20 MEQ tablet Take 20 mEq by mouth daily.    Historical Provider, MD  promethazine (PHENERGAN) 12.5 MG suppository Place 1 suppository (12.5 mg total) rectally every 6 (six) hours as needed for nausea or vomiting. Patient not taking: Reported on 02/05/2016 09/25/15   Jennye MoccasinBrian S Quigley, MD  promethazine (PHENERGAN) 12.5 MG tablet Take 1 tablet (12.5 mg total) by mouth every 6 (six) hours as needed for nausea or vomiting. Patient not taking: Reported on 02/05/2016 09/25/15   Jennye MoccasinBrian S Quigley, MD  promethazine (PHENERGAN) 12.5 MG tablet Take 1 tablet (12.5 mg total) by mouth every 6 (six) hours as needed for nausea or vomiting. 03/12/16   Willy EddyPatrick Robinson, MD  ranitidine (ZANTAC) 150 MG tablet Take 1 tablet (150 mg total) by mouth at bedtime. 03/12/16 03/12/17  Willy EddyPatrick Robinson, MD  sucralfate (CARAFATE) 1 G tablet Take 1 tablet (1 g total) by mouth 4 (four) times daily. Patient not taking: Reported on 02/05/2016 05/09/15   Delfino LovettVipul Shah, MD  sucralfate (CARAFATE) 1 g tablet Take 1 tablet (1 g total) by mouth 2 (two) times daily. 01/27/16   Rebecka ApleyAllison P Webster, MD  tiZANidine (ZANAFLEX) 4 MG tablet Limit 1-3 tabs by mouth per day if tolerated 02/05/16   Ewing SchleinGregory Crisp, MD  traZODone (DESYREL) 100 MG tablet Take 100 mg by mouth at bedtime.    Historical Provider, MD  zolpidem (AMBIEN CR) 12.5 MG CR tablet Take 12.5 mg by mouth at bedtime as needed for sleep.    Historical Provider, MD    Allergies  Allergen Reactions  . Acetaminophen-Codeine Hives  . Codeine Hives  . Ibuprofen Nausea And Vomiting  . Morphine Nausea And Vomiting  . Propoxyphene Other (See Comments)    Reaction:  GI upset   . Sulfa Antibiotics Hives  . Duloxetine Rash and Other (See Comments)    Yellowing of the skin    Family History  Problem Relation Age of Onset  . Arthritis Mother   . Depression Mother   . Diabetes Mother   . Heart disease Mother   . Hyperlipidemia Mother   .  Hypertension Mother   . Stroke Mother   . Arthritis Father   . Diabetes Father   . Hearing loss Father   . Heart disease Father   . Hyperlipidemia Father   . Hypertension Father     Social History Social History  Substance Use Topics  . Smoking status: Never Smoker  . Smokeless tobacco: Never Used  . Alcohol use 0.0 oz/week     Comment: occasional    Review of Systems  Constitutional: Negative for fever. Eyes: Negative for visual changes. ENT: Negative for sore throat. Cardiovascular: Negative for chest pain. Respiratory: Negative for shortness of  breath. Gastrointestinal: Positive for vomiting and diarrhea as well as abdominal pain. Genitourinary: Negative for dysuria. Musculoskeletal: Negative for back pain. Skin: Negative for rash. Neurological: Negative for headache. 10 point Review of Systems otherwise negative ____________________________________________   PHYSICAL EXAM:  VITAL SIGNS: ED Triage Vitals  Enc Vitals Group     BP --      Pulse Rate 03/21/16 2201 85     Resp 03/21/16 2201 13     Temp 03/21/16 2201 98.3 F (36.8 C)     Temp Source 03/21/16 2201 Oral     SpO2 03/21/16 2201 96 %     Weight 03/21/16 2158 291 lb (132 kg)     Height 03/21/16 2158 5\' 5"  (1.651 m)     Head Circumference --      Peak Flow --      Pain Score 03/21/16 2159 10     Pain Loc --      Pain Edu? --      Excl. in GC? --      Constitutional: Alert and oriented. Holding her emesis bag. No acute distress. HEENT   Head: Normocephalic and atraumatic.      Eyes: Conjunctivae are normal. PERRL. Normal extraocular movements.      Ears:         Nose: No congestion/rhinnorhea.   Mouth/Throat: Mucous membranes are mildly dry..   Neck: No stridor. Cardiovascular/Chest: Normal rate, regular rhythm.  No murmurs, rubs, or gallops. Respiratory: Normal respiratory effort without tachypnea nor retractions. Breath sounds are clear and equal bilaterally. No  wheezes/rales/rhonchi. Gastrointestinal: Soft. No distention, no guarding, no rebound. Obese. Moderate tenderness in the mid abdomen.  Genitourinary/rectal:Deferred Musculoskeletal: Nontender with normal range of motion in all extremities. No joint effusions.  No lower extremity tenderness.  1+ lower extremity edema bilateral. Neurologic:  Normal speech and language. No gross or focal neurologic deficits are appreciated. Skin:  Skin is warm, dry and intact. No rash noted. Psychiatric: Mood and affect are normal. Speech and behavior are normal. Patient exhibits appropriate insight and judgment.   ____________________________________________  LABS (pertinent positives/negatives)  Labs Reviewed  COMPREHENSIVE METABOLIC PANEL - Abnormal; Notable for the following:       Result Value   Glucose, Bld 129 (*)    Total Protein 9.3 (*)    All other components within normal limits  CBC WITH DIFFERENTIAL/PLATELET - Abnormal; Notable for the following:    WBC 11.7 (*)    RBC 5.72 (*)    HCT 47.4 (*)    RDW 17.6 (*)    Lymphs Abs 4.9 (*)    All other components within normal limits  LIPASE, BLOOD  TROPONIN I    ____________________________________________    EKG I, Governor Rooks, MD, the attending physician have personally viewed and interpreted all ECGs.  73 beats a minute. Normal sinus rhythm. Narrow QRS. Normal axis. Normal ST and T-wave ____________________________________________  RADIOLOGY All Xrays were viewed by me. Imaging interpreted by Radiologist.  Chest and abdomen x-ray: Pending __________________________________________  PROCEDURES  Procedure(s) performed: None  Critical Care performed: None  ____________________________________________   ED COURSE / ASSESSMENT AND PLAN  Pertinent labs & imaging results that were available during my care of the patient were reviewed by me and considered in my medical decision making (see chart for details).   Ms. Rae is  here with recurrence of nausea vomiting and diarrhea similar to prior episodes, most recently 1 week ago. She is unsure what brought this on, but states  that this is similar to previous episodes and as we discussed I would try to avoid CT scanning due to multiple CTs in the past, unless this is a worse or different than typical for her symptoms significantly abnormal laboratory studies.  I am going to treat her symptomatically with IV fluids, pain and nausea medicine.  Patient care transferred to Dr. Langston Masker at 11, shift change. Chest x-ray/abdomen x-ray are pending. Disposition after reevaluation for symptomatic relief.   CONSULTATIONS:   None  Patient / Family / Caregiver informed of clinical course, medical decision-making process, and agree with plan.     ___________________________________________   FINAL CLINICAL IMPRESSION(S) / ED DIAGNOSES   Final diagnoses:  Nausea vomiting and diarrhea              Note: This dictation was prepared with Dragon dictation. Any transcriptional errors that result from this process are unintentional    Governor Rooks, MD 03/21/16 2312

## 2016-03-21 NOTE — ED Triage Notes (Signed)
Pt arrived via ems from home and waking up this morning with diarrhea. Pt had 2 episodes of diarrhea and took a anti-diarrhea medication that was effective. Pt then became sick on her stomach which progressively became worse throughout the day. Pt states having 7 episodes of vomiting and was vomiting upon assessment. Vomit is clear and yellow. Pt states taking 25 mg of phenergan but throwing it right up.

## 2016-03-22 ENCOUNTER — Observation Stay
Admission: EM | Admit: 2016-03-22 | Discharge: 2016-03-26 | Disposition: A | Discharge status: Home / Self Care | Payer: Medicare Other | Attending: Internal Medicine | Admitting: Internal Medicine

## 2016-03-22 ENCOUNTER — Encounter: Payer: Self-pay | Admitting: Emergency Medicine

## 2016-03-22 DIAGNOSIS — I160 Hypertensive urgency: Secondary | ICD-10-CM | POA: Diagnosis present

## 2016-03-22 DIAGNOSIS — R112 Nausea with vomiting, unspecified: Secondary | ICD-10-CM

## 2016-03-22 DIAGNOSIS — E1143 Type 2 diabetes mellitus with diabetic autonomic (poly)neuropathy: Secondary | ICD-10-CM | POA: Diagnosis not present

## 2016-03-22 DIAGNOSIS — R1084 Generalized abdominal pain: Secondary | ICD-10-CM

## 2016-03-22 LAB — LIPASE, BLOOD: LIPASE: 16 U/L (ref 11–51)

## 2016-03-22 LAB — COMPREHENSIVE METABOLIC PANEL
ALK PHOS: 58 U/L (ref 38–126)
ALT: 13 U/L — ABNORMAL LOW (ref 14–54)
AST: 14 U/L — AB (ref 15–41)
Albumin: 4.4 g/dL (ref 3.5–5.0)
Anion gap: 9 (ref 5–15)
BILIRUBIN TOTAL: 0.7 mg/dL (ref 0.3–1.2)
BUN: 7 mg/dL (ref 6–20)
CALCIUM: 9.3 mg/dL (ref 8.9–10.3)
CO2: 26 mmol/L (ref 22–32)
CREATININE: 0.86 mg/dL (ref 0.44–1.00)
Chloride: 100 mmol/L — ABNORMAL LOW (ref 101–111)
GFR calc Af Amer: >60 mL/min (ref >60)
GLUCOSE: 162 mg/dL — AB (ref 65–99)
POTASSIUM: 3.6 mmol/L (ref 3.5–5.1)
Sodium: 135 mmol/L (ref 135–145)
TOTAL PROTEIN: 9 g/dL — AB (ref 6.5–8.1)

## 2016-03-22 LAB — CBC
HEMATOCRIT: 40.4 % (ref 35.0–47.0)
HEMOGLOBIN: 13.8 g/dL (ref 12.0–16.0)
MCH: 27.5 pg (ref 26.0–34.0)
MCHC: 34.1 g/dL (ref 32.0–36.0)
MCV: 80.7 fL (ref 80.0–100.0)
Platelets: 416 10*3/uL (ref 150–440)
RBC: 5.01 MIL/uL (ref 3.80–5.20)
RDW: 16.9 % — AB (ref 11.5–14.5)
WBC: 12.7 10*3/uL — AB (ref 3.6–11.0)

## 2016-03-22 LAB — GLUCOSE, CAPILLARY: Glucose-Capillary: 161 mg/dL — ABNORMAL HIGH (ref 65–99)

## 2016-03-22 MED ORDER — SODIUM CHLORIDE 0.9 % IV BOLUS (SEPSIS)
1000.0000 mL | Freq: Once | INTRAVENOUS | Status: AC
  Administered 2016-03-22: 1000 mL via INTRAVENOUS

## 2016-03-22 MED ORDER — HYDROMORPHONE HCL 1 MG/ML IJ SOLN
1.0000 mg | Freq: Once | INTRAMUSCULAR | Status: AC
  Administered 2016-03-22: 1 mg via INTRAVENOUS
  Filled 2016-03-22: qty 1

## 2016-03-22 MED ORDER — SUCRALFATE 1 G PO TABS: 1.0000 g | ORAL_TABLET | Freq: Four times a day (QID) | ORAL | 0 refills | Status: DC

## 2016-03-22 MED ORDER — METOCLOPRAMIDE HCL 10 MG PO TABS: 10.0000 mg | ORAL_TABLET | Freq: Four times a day (QID) | ORAL | 0 refills | Status: DC | PRN

## 2016-03-22 MED ORDER — LORAZEPAM 2 MG/ML IJ SOLN
1.0000 mg | Freq: Once | INTRAMUSCULAR | Status: AC
  Administered 2016-03-22: 1 mg via INTRAVENOUS
  Filled 2016-03-22: qty 1

## 2016-03-22 MED ORDER — METOCLOPRAMIDE HCL 5 MG/ML IJ SOLN
10.0000 mg | Freq: Once | INTRAMUSCULAR | Status: AC
  Administered 2016-03-22: 10 mg via INTRAVENOUS
  Filled 2016-03-22: qty 2

## 2016-03-22 NOTE — ED Notes (Signed)
Pt given urine cup and informed of need for urine sample.  

## 2016-03-22 NOTE — ED Triage Notes (Signed)
Pt arrived via ems from home after being discharged yesterday with the same abdominal pain/n/v complaint. Pt states taking Reglan at home with no relief.

## 2016-03-22 NOTE — ED Provider Notes (Signed)
Signout from Dr. Shaune PollackLord to reassess this patient after being medicated. Dr. Nehemiah MassedLohr was suspecting diabetic gastroparesis and says that the patient has been successful with home treatment with by mouth medications. Physical Exam  BP (!) 156/91   Pulse 100   Temp 98.3 F (36.8 C) (Oral)   Resp (!) 21   Ht 5\' 5"  (1.651 m)   Wt 291 lb (132 kg)   SpO2 90%   BMI 48.42 kg/m  ----------------------------------------- 1:41 AM on 03/22/2016 -----------------------------------------   Physical Exam Patient does not want me to reexamine her abdomen at this time. She says that if I push her abdomen and will start to hurt again. ED Course  Procedures  MDM Upon reassessment the patient says that she has feels better and is able to tolerate water. She says that she is out of her medications at home and is requesting sucralfate as well as Reglan. Will be discharged to home. Mildly elevated white blood cell count however this appears to be a chronic issue.       Myrna Blazeravid Matthew Schaevitz, MD 03/22/16 [phone]

## 2016-03-23 DIAGNOSIS — I160 Hypertensive urgency: Secondary | ICD-10-CM | POA: Diagnosis present

## 2016-03-23 DIAGNOSIS — E1143 Type 2 diabetes mellitus with diabetic autonomic (poly)neuropathy: Secondary | ICD-10-CM | POA: Diagnosis not present

## 2016-03-23 LAB — GLUCOSE, CAPILLARY
Glucose-Capillary: 151 mg/dL — ABNORMAL HIGH (ref 65–99)
Glucose-Capillary: 134 mg/dL — ABNORMAL HIGH (ref 65–99)
Glucose-Capillary: 161 mg/dL — ABNORMAL HIGH (ref 65–99)
Glucose-Capillary: 125 mg/dL — ABNORMAL HIGH (ref 65–99)

## 2016-03-23 LAB — TSH: TSH: 0.306 u[IU]/mL — ABNORMAL LOW (ref 0.350–4.500)

## 2016-03-23 LAB — TROPONIN I

## 2016-03-23 LAB — T4, FREE: FREE T4: 1.07 ng/dL (ref 0.61–1.12)

## 2016-03-23 MED ORDER — TRAZODONE HCL 100 MG PO TABS
100.0000 mg | ORAL_TABLET | Freq: Every day | ORAL | Status: DC
  Administered 2016-03-23 – 2016-03-25 (×3): 100 mg via ORAL
  Filled 2016-03-23 (×3): qty 1

## 2016-03-23 MED ORDER — HALOPERIDOL LACTATE 5 MG/ML IJ SOLN
2.5000 mg | Freq: Once | INTRAMUSCULAR | Status: AC
  Administered 2016-03-23: 2.5 mg via INTRAVENOUS

## 2016-03-23 MED ORDER — FLUOXETINE HCL 20 MG PO CAPS
20.0000 mg | ORAL_CAPSULE | Freq: Every day | ORAL | Status: DC
  Administered 2016-03-23 – 2016-03-26 (×4): 20 mg via ORAL
  Filled 2016-03-23 (×4): qty 1

## 2016-03-23 MED ORDER — METOPROLOL TARTRATE 5 MG/5ML IV SOLN
5.0000 mg | Freq: Once | INTRAVENOUS | Status: AC
  Administered 2016-03-23: 5 mg via INTRAVENOUS
  Filled 2016-03-23: qty 5

## 2016-03-23 MED ORDER — AMLODIPINE BESYLATE 5 MG PO TABS
5.0000 mg | ORAL_TABLET | Freq: Every day | ORAL | Status: DC
  Administered 2016-03-23 – 2016-03-26 (×4): 5 mg via ORAL
  Filled 2016-03-23 (×4): qty 1

## 2016-03-23 MED ORDER — TIZANIDINE HCL 2 MG PO TABS: 4.0000 mg | ORAL_TABLET | Freq: Four times a day (QID) | ORAL | Status: DC | PRN

## 2016-03-23 MED ORDER — POLYETHYLENE GLYCOL 3350 17 G PO PACK: 17.0000 g | PACK | Freq: Every day | ORAL | Status: DC | PRN

## 2016-03-23 MED ORDER — LISINOPRIL 20 MG PO TABS
20.0000 mg | ORAL_TABLET | Freq: Every day | ORAL | Status: DC
  Administered 2016-03-23: 20 mg via ORAL
  Filled 2016-03-23: qty 1

## 2016-03-23 MED ORDER — DICYCLOMINE HCL 20 MG PO TABS
20.0000 mg | ORAL_TABLET | Freq: Three times a day (TID) | ORAL | Status: DC | PRN
  Filled 2016-03-23: qty 1

## 2016-03-23 MED ORDER — LAMOTRIGINE 25 MG PO TABS
100.0000 mg | ORAL_TABLET | Freq: Every day | ORAL | Status: DC
  Administered 2016-03-23 – 2016-03-26 (×4): 100 mg via ORAL
  Filled 2016-03-23 (×4): qty 4

## 2016-03-23 MED ORDER — ONDANSETRON HCL 4 MG PO TABS: 4.0000 mg | ORAL_TABLET | Freq: Four times a day (QID) | ORAL | Status: DC | PRN

## 2016-03-23 MED ORDER — HYDRALAZINE HCL 20 MG/ML IJ SOLN
20.0000 mg | Freq: Once | INTRAMUSCULAR | Status: AC
  Administered 2016-03-23: 20 mg via INTRAVENOUS
  Filled 2016-03-23: qty 1

## 2016-03-23 MED ORDER — ONDANSETRON HCL 4 MG/2ML IJ SOLN: 4.0000 mg | Freq: Four times a day (QID) | INTRAMUSCULAR | Status: DC

## 2016-03-23 MED ORDER — LISINOPRIL 20 MG PO TABS
40.0000 mg | ORAL_TABLET | Freq: Every day | ORAL | Status: DC
  Administered 2016-03-24 – 2016-03-26 (×3): 40 mg via ORAL
  Filled 2016-03-23 (×3): qty 2

## 2016-03-23 MED ORDER — METOPROLOL SUCCINATE ER 50 MG PO TB24
50.0000 mg | ORAL_TABLET | Freq: Every day | ORAL | Status: DC
  Administered 2016-03-23 – 2016-03-26 (×4): 50 mg via ORAL
  Filled 2016-03-23 (×4): qty 1

## 2016-03-23 MED ORDER — ALPRAZOLAM 1 MG PO TABS
2.0000 mg | ORAL_TABLET | Freq: Three times a day (TID) | ORAL | Status: DC | PRN
  Administered 2016-03-23 – 2016-03-26 (×9): 2 mg via ORAL
  Filled 2016-03-23 (×10): qty 2

## 2016-03-23 MED ORDER — HYDRALAZINE HCL 20 MG/ML IJ SOLN
INTRAMUSCULAR | Status: AC
  Administered 2016-03-23: 15 mg via INTRAVENOUS
  Filled 2016-03-23: qty 1

## 2016-03-23 MED ORDER — ONDANSETRON HCL 4 MG/2ML IJ SOLN
4.0000 mg | Freq: Four times a day (QID) | INTRAMUSCULAR | Status: DC | PRN
  Administered 2016-03-24: 4 mg via INTRAVENOUS
  Filled 2016-03-23: qty 2

## 2016-03-23 MED ORDER — PANTOPRAZOLE SODIUM 40 MG PO TBEC
40.0000 mg | DELAYED_RELEASE_TABLET | Freq: Two times a day (BID) | ORAL | Status: DC
  Administered 2016-03-23 (×2): 40 mg via ORAL
  Filled 2016-03-23 (×2): qty 1

## 2016-03-23 MED ORDER — PROMETHAZINE HCL 25 MG PO TABS
12.5000 mg | ORAL_TABLET | Freq: Four times a day (QID) | ORAL | Status: DC | PRN
  Administered 2016-03-24: 12.5 mg via ORAL
  Filled 2016-03-23: qty 1

## 2016-03-23 MED ORDER — LABETALOL HCL 5 MG/ML IV SOLN
5.0000 mg | INTRAVENOUS | Status: DC | PRN
  Administered 2016-03-23: 10 mg via INTRAVENOUS
  Filled 2016-03-23: qty 4

## 2016-03-23 MED ORDER — SUCRALFATE 1 G PO TABS
1.0000 g | ORAL_TABLET | Freq: Four times a day (QID) | ORAL | Status: DC
  Administered 2016-03-23 – 2016-03-26 (×13): 1 g via ORAL
  Filled 2016-03-23 (×13): qty 1

## 2016-03-23 MED ORDER — FUROSEMIDE 20 MG PO TABS
20.0000 mg | ORAL_TABLET | Freq: Every day | ORAL | Status: DC
  Administered 2016-03-23 – 2016-03-26 (×4): 20 mg via ORAL
  Filled 2016-03-23 (×4): qty 1

## 2016-03-23 MED ORDER — ONDANSETRON HCL 4 MG/2ML IJ SOLN
4.0000 mg | Freq: Four times a day (QID) | INTRAMUSCULAR | Status: DC | PRN
  Administered 2016-03-23: 4 mg via INTRAVENOUS
  Filled 2016-03-23: qty 2

## 2016-03-23 MED ORDER — HALOPERIDOL LACTATE 5 MG/ML IJ SOLN
INTRAMUSCULAR | Status: AC
  Administered 2016-03-23: 2.5 mg via INTRAVENOUS
  Filled 2016-03-23: qty 1

## 2016-03-23 MED ORDER — ACETAMINOPHEN 325 MG PO TABS
650.0000 mg | ORAL_TABLET | Freq: Four times a day (QID) | ORAL | Status: DC | PRN
Start: 2016-03-23 — End: 2016-03-26
  Administered 2016-03-23: 650 mg via ORAL
  Filled 2016-03-23: qty 2

## 2016-03-23 MED ORDER — NITROGLYCERIN 2 % TD OINT
0.5000 [in_us] | TOPICAL_OINTMENT | Freq: Four times a day (QID) | TRANSDERMAL | Status: DC
  Administered 2016-03-23: 0.5 [in_us] via TOPICAL

## 2016-03-23 MED ORDER — POTASSIUM CHLORIDE CRYS ER 20 MEQ PO TBCR
20.0000 meq | EXTENDED_RELEASE_TABLET | Freq: Every day | ORAL | Status: DC
  Administered 2016-03-23 – 2016-03-26 (×4): 20 meq via ORAL
  Filled 2016-03-23 (×4): qty 1

## 2016-03-23 MED ORDER — NITROGLYCERIN 2 % TD OINT
TOPICAL_OINTMENT | TRANSDERMAL | Status: AC
  Filled 2016-03-23: qty 1

## 2016-03-23 MED ORDER — SODIUM CHLORIDE 0.9 % IV SOLN
INTRAVENOUS | Status: DC
  Administered 2016-03-23: 07:00:00 via INTRAVENOUS

## 2016-03-23 MED ORDER — SODIUM CHLORIDE 0.9 % IV BOLUS (SEPSIS)
1000.0000 mL | Freq: Once | INTRAVENOUS | Status: AC
  Administered 2016-03-23: 1000 mL via INTRAVENOUS

## 2016-03-23 MED ORDER — HYDROCODONE-ACETAMINOPHEN 5-325 MG PO TABS
1.0000 | ORAL_TABLET | ORAL | Status: DC | PRN
  Administered 2016-03-23 – 2016-03-25 (×7): 2 via ORAL
  Administered 2016-03-25 (×2): 1 via ORAL
  Administered 2016-03-26 (×2): 2 via ORAL
  Filled 2016-03-23: qty 1
  Filled 2016-03-23 (×4): qty 2
  Filled 2016-03-23: qty 1
  Filled 2016-03-23 (×5): qty 2

## 2016-03-23 MED ORDER — NORTRIPTYLINE HCL 25 MG PO CAPS: 25.0000 mg | ORAL_CAPSULE | Freq: Two times a day (BID) | ORAL | Status: DC | PRN

## 2016-03-23 MED ORDER — METOCLOPRAMIDE HCL 5 MG/ML IJ SOLN
10.0000 mg | Freq: Four times a day (QID) | INTRAMUSCULAR | Status: AC
  Administered 2016-03-23 – 2016-03-24 (×6): 10 mg via INTRAVENOUS
  Filled 2016-03-23 (×6): qty 2

## 2016-03-23 MED ORDER — CLONIDINE HCL 0.1 MG/24HR TD PTWK
0.1000 mg | MEDICATED_PATCH | TRANSDERMAL | Status: DC
  Administered 2016-03-23: 0.1 mg via TRANSDERMAL
  Filled 2016-03-23: qty 1

## 2016-03-23 MED ORDER — ZOLPIDEM TARTRATE 5 MG PO TABS
5.0000 mg | ORAL_TABLET | Freq: Every evening | ORAL | Status: DC | PRN
  Administered 2016-03-24 – 2016-03-25 (×2): 5 mg via ORAL
  Filled 2016-03-23 (×2): qty 1

## 2016-03-23 MED ORDER — DICLOFENAC SODIUM 1 % TD GEL
4.0000 g | Freq: Four times a day (QID) | TRANSDERMAL | Status: DC | PRN
  Filled 2016-03-23: qty 100

## 2016-03-23 MED ORDER — INSULIN ASPART 100 UNIT/ML ~~LOC~~ SOLN: 0.0000 [IU] | Freq: Every day | SUBCUTANEOUS | Status: DC

## 2016-03-23 MED ORDER — INSULIN ASPART 100 UNIT/ML ~~LOC~~ SOLN
0.0000 [IU] | Freq: Three times a day (TID) | SUBCUTANEOUS | Status: DC
  Administered 2016-03-23: 1 [IU] via SUBCUTANEOUS
  Administered 2016-03-23 (×2): 2 [IU] via SUBCUTANEOUS
  Administered 2016-03-24 (×2): 1 [IU] via SUBCUTANEOUS
  Filled 2016-03-23: qty 1
  Filled 2016-03-23: qty 2
  Filled 2016-03-23 (×2): qty 1
  Filled 2016-03-23: qty 2

## 2016-03-23 MED ORDER — ACETAMINOPHEN 650 MG RE SUPP: 650.0000 mg | Freq: Four times a day (QID) | RECTAL | Status: DC | PRN

## 2016-03-23 MED ORDER — METOCLOPRAMIDE HCL 10 MG PO TABS
10.0000 mg | ORAL_TABLET | Freq: Four times a day (QID) | ORAL | Status: AC | PRN
  Administered 2016-03-23: 10 mg via ORAL
  Filled 2016-03-23: qty 1

## 2016-03-23 MED ORDER — SODIUM CHLORIDE 0.9% FLUSH
3.0000 mL | Freq: Two times a day (BID) | INTRAVENOUS | Status: DC
  Administered 2016-03-23 – 2016-03-25 (×5): 3 mL via INTRAVENOUS

## 2016-03-23 MED ORDER — ENOXAPARIN SODIUM 40 MG/0.4ML ~~LOC~~ SOLN
40.0000 mg | Freq: Two times a day (BID) | SUBCUTANEOUS | Status: DC
  Administered 2016-03-23 – 2016-03-26 (×6): 40 mg via SUBCUTANEOUS
  Filled 2016-03-23 (×6): qty 0.4

## 2016-03-23 MED ORDER — HYDRALAZINE HCL 20 MG/ML IJ SOLN
15.0000 mg | Freq: Once | INTRAMUSCULAR | Status: AC
  Administered 2016-03-23: 15 mg via INTRAVENOUS

## 2016-03-23 MED ORDER — METOCLOPRAMIDE HCL 10 MG PO TABS: 10.0000 mg | ORAL_TABLET | Freq: Four times a day (QID) | ORAL | Status: DC | PRN

## 2016-03-23 NOTE — ED Provider Notes (Signed)
[facility] Emergency Department Provider Note   ____________________________________________   First MD Initiated Contact with Patient 03/22/16 2338     (approximate)  I have reviewed the triage vital signs and the nursing notes.   HISTORY  Chief Complaint Abdominal Pain and Nausea    HPI Daisy [name] is a 50 y.o. female who comes into the hospital today with vomiting and abdominal pain. The patient was here yesterday for the same thing. The patient reports that the symptoms started yesterday. She was given Reglan, Phenergan, Zofran and some sort of pain medicine that she does not remember. She reports that the pain is mostly in her epigastric area but she does not have a gallbladder. She reports that she vomited twice after taking Reglan. She reports that she's had this happen multiple times in the past. She has a GI doctor in [place] but has not called her doctor. She reports that these problems seemed to begin after having appendectomy surgery as well as some hernia surgeries. The patient rates her pain a 10 out of 10 in intensity. She reports that she was able to take her normal blood pressure medicines early this morning without vomiting but not this afternoon. The patient is here for evaluation.   Past Medical History:  Diagnosis Date  . Anxiety   . Depression   . Diabetes mellitus without complication (HCC)   . GERD (gastroesophageal reflux disease)   . Hypertension   . Insomnia   . Migraine   . Obesity     Patient Active Problem List   Diagnosis Date Noted  . Hypokalemia 09/28/2015  . Gastroenteritis 09/28/2015  . Intractable abdominal pain 05/05/2015  . Gastritis 01/07/2015  . DDD (degenerative disc disease), lumbar 11/28/2014  . DJD (degenerative joint disease) of knee 11/28/2014  . Migraine headache 11/28/2014  . Bilateral occipital neuralgia 11/28/2014  . Morbid obesity (HCC) 11/28/2014  . Neuropathy due to secondary diabetes  (HCC) 11/28/2014    Past Surgical History:  Procedure Laterality Date  . ABDOMINAL HYSTERECTOMY    . APPENDECTOMY    . APPLICATION OF WOUND VAC    . CHOLECYSTECTOMY    . HERNIA REPAIR    . KNEE SURGERY      Prior to Admission medications   Medication Sig Start Date End Date Taking? Authorizing Provider  alprazolam Prudy Feeler) 2 MG tablet Take 2 mg by mouth 3 (three) times daily as needed for anxiety.     Historical Provider, MD  diclofenac sodium (VOLTAREN) 1 % GEL Apply 2-4 g to painful areas of the skin 4 times per day if tolerated. ONLY APPLY A MAXIMUM OF 4 GRAMS PER APPLICATION Patient not taking: Reported on 02/05/2016 09/18/15   Ewing Schlein, MD  dicyclomine (BENTYL) 20 MG tablet Take 1 tablet (20 mg total) by mouth 3 (three) times daily as needed for spasms. 03/12/16   Willy Eddy, MD  FLUoxetine (PROZAC) 20 MG capsule Take 20 mg by mouth daily.    Historical Provider, MD  furosemide (LASIX) 20 MG tablet Take 20 mg by mouth daily.    Historical Provider, MD  lamoTRIgine (LAMICTAL) 100 MG tablet Take 100 mg by mouth daily.    Historical Provider, MD  lisinopril (PRINIVIL,ZESTRIL) 20 MG tablet Take 20 mg by mouth daily.    Historical Provider, MD  metFORMIN (GLUCOPHAGE) 500 MG tablet Take 500 mg by mouth daily.    Historical Provider, MD  metoCLOPramide (REGLAN) 10 MG tablet Take 1 tablet (10 mg  total) by mouth every 6 (six) hours as needed. 03/22/16   Myrna Blazeravid Matthew Schaevitz, MD  metoprolol succinate (TOPROL-XL) 25 MG 24 hr tablet Take 50 mg by mouth daily.    Historical Provider, MD  nortriptyline (PAMELOR) 25 MG capsule Take 25 mg by mouth 2 (two) times daily as needed (for anxiety). Reported on 12/11/2015    Historical Provider, MD  ondansetron (ZOFRAN) 4 MG tablet Take 1 tablet (4 mg total) by mouth every 8 (eight) hours as needed for nausea or vomiting. 09/25/15   Jennye MoccasinBrian S Quigley, MD  Oxycodone HCl 10 MG TABS Limit  4 - 7 tabs by mouth per day if tolerated 02/05/16   Ewing SchleinGregory Crisp,  MD  pantoprazole (PROTONIX) 40 MG tablet Take 40 mg by mouth 2 (two) times daily.     Historical Provider, MD  polyethylene glycol (MIRALAX / GLYCOLAX) packet Take 17 g by mouth daily as needed for mild constipation. 05/09/15   Delfino LovettVipul Shah, MD  potassium chloride (KCL) 2 mEq/mL SOLN oral liquid Take by mouth 2 (two) times daily as needed (takes every other day with lasix).    Historical Provider, MD  potassium chloride SA (K-DUR,KLOR-CON) 20 MEQ tablet Take 20 mEq by mouth daily.    Historical Provider, MD  promethazine (PHENERGAN) 12.5 MG suppository Place 1 suppository (12.5 mg total) rectally every 6 (six) hours as needed for nausea or vomiting. Patient not taking: Reported on 02/05/2016 09/25/15   Jennye MoccasinBrian S Quigley, MD  promethazine (PHENERGAN) 12.5 MG tablet Take 1 tablet (12.5 mg total) by mouth every 6 (six) hours as needed for nausea or vomiting. Patient not taking: Reported on 02/05/2016 09/25/15   Jennye MoccasinBrian S Quigley, MD  promethazine (PHENERGAN) 12.5 MG tablet Take 1 tablet (12.5 mg total) by mouth every 6 (six) hours as needed for nausea or vomiting. 03/12/16   Willy EddyPatrick Robinson, MD  ranitidine (ZANTAC) 150 MG tablet Take 1 tablet (150 mg total) by mouth at bedtime. 03/12/16 03/12/17  Willy EddyPatrick Robinson, MD  sucralfate (CARAFATE) 1 g tablet Take 1 tablet (1 g total) by mouth 4 (four) times daily. 03/22/16 03/22/17  Myrna Blazeravid Matthew Schaevitz, MD  tiZANidine (ZANAFLEX) 4 MG tablet Limit 1-3 tabs by mouth per day if tolerated 02/05/16   Ewing SchleinGregory Crisp, MD  traZODone (DESYREL) 100 MG tablet Take 100 mg by mouth at bedtime.    Historical Provider, MD  zolpidem (AMBIEN CR) 12.5 MG CR tablet Take 12.5 mg by mouth at bedtime as needed for sleep.    Historical Provider, MD    Allergies Acetaminophen-codeine; Codeine; Ibuprofen; Morphine; Propoxyphene; Sulfa antibiotics; and Duloxetine  Family History  Problem Relation Age of Onset  . Arthritis Mother   . Depression Mother   . Diabetes Mother   . Heart disease  Mother   . Hyperlipidemia Mother   . Hypertension Mother   . Stroke Mother   . Arthritis Father   . Diabetes Father   . Hearing loss Father   . Heart disease Father   . Hyperlipidemia Father   . Hypertension Father     Social History Social History  Substance Use Topics  . Smoking status: Never Smoker  . Smokeless tobacco: Never Used  . Alcohol use 0.0 oz/week     Comment: occasional    Review of Systems Constitutional: No fever/chills Eyes: No visual changes. ENT: No sore throat. Cardiovascular: Denies chest pain. Respiratory: Denies shortness of breath. Gastrointestinal:  abdominal pain. nausea,  vomiting.  No diarrhea.  No constipation. Genitourinary: Negative for  dysuria. Musculoskeletal: Negative for back pain. Skin: Negative for rash. Neurological: Negative for headaches, focal weakness or numbness.  10-point ROS otherwise negative.  ____________________________________________   PHYSICAL EXAM:  VITAL SIGNS: ED Triage Vitals  Enc Vitals Group     BP 03/22/16 2301 (!) 228/121     Pulse Rate 03/22/16 2253 78     Resp 03/22/16 2253 (!) 21     Temp 03/22/16 2253 97.1 F (36.2 C)     Temp Source 03/22/16 2253 Oral     SpO2 03/22/16 2301 99 %     Weight 03/22/16 2253 291 lb (132 kg)     Height 03/22/16 2253 5\' 5"  (1.651 m)     Head Circumference --      Peak Flow --      Pain Score 03/22/16 2253 9     Pain Loc --      Pain Edu? --      Excl. in GC? --     Constitutional: Alert and oriented. Well appearing and in Moderate distress. Eyes: Conjunctivae are normal. PERRL. EOMI. Head: Atraumatic. Nose: No congestion/rhinnorhea. Mouth/Throat: Mucous membranes are moist.  Oropharynx non-erythematous. Cardiovascular: Normal rate, regular rhythm. Grossly normal heart sounds.  Good peripheral circulation. Respiratory: Normal respiratory effort.  No retractions. Lungs CTAB. Gastrointestinal: Soft With diffuse tenderness to palpation. No distention. Positive  bowel sounds Musculoskeletal: No lower extremity tenderness nor edema.   Neurologic:  Normal speech and language.  Skin:  Skin is warm, dry and intact.  Psychiatric: Mood and affect are normal.   ____________________________________________   LABS (all labs ordered are listed, but only abnormal results are displayed)  Labs Reviewed  GLUCOSE, CAPILLARY - Abnormal; Notable for the following:       Result Value   Glucose-Capillary 161 (*)    All other components within normal limits  CBC - Abnormal; Notable for the following:    WBC 12.7 (*)    RDW 16.9 (*)    All other components within normal limits  COMPREHENSIVE METABOLIC PANEL - Abnormal; Notable for the following:    Chloride 100 (*)    Glucose, Bld 162 (*)    Total Protein 9.0 (*)    AST 14 (*)    ALT 13 (*)    All other components within normal limits  LIPASE, BLOOD  TROPONIN I   ____________________________________________  EKG  ED ECG REPORT I, Rebecka Apley, the attending physician, personally viewed and interpreted this ECG.   Date: 03/22/2016  EKG Time: 2251  Rate: 76  Rhythm: normal sinus rhythm  Axis: normal  Intervals:none  ST&T Change: none  ____________________________________________  RADIOLOGY  none ____________________________________________   PROCEDURES  Procedure(s) performed: None  Procedures  Critical Care performed: No  ____________________________________________   INITIAL IMPRESSION / ASSESSMENT AND PLAN / ED COURSE  Pertinent labs & imaging results that were available during my care of the patient were reviewed by me and considered in my medical decision making (see chart for details).  This is a 50 year old female who comes into the hospital today with abdominal pain and vomiting. The patient was seen yesterday but reports that the symptoms are persistent. I will give the patient some Dilaudid as well as Ativan for nausea. I will then reassess the patient.  The  patient reports that she was in some continued pains I will give the patient some low-dose Haldol which may help with some chronic pain as well as some nausea. The patient's pain did have some  mild improvement but she still had some elevated blood pressure. I gave the patient a dose of Lopressor which had some mild improvement and I repeated the dose. Given the patient's elevated blood pressure as well as her continued pain and nausea and feels that the patient would be better served if she were admitted to the hospital. The patient will be admitted to the hospitalist service for further evaluation of her abdominal pain and control of her blood pressure. I did not perform a CT scan as the patient has had multiples in the past.   Clinical Course     ____________________________________________   FINAL CLINICAL IMPRESSION(S) / ED DIAGNOSES  Final diagnoses:  Hypertensive urgency  Generalized abdominal pain  Nausea and vomiting, intractability of vomiting not specified, unspecified vomiting type      NEW MEDICATIONS STARTED DURING THIS VISIT:  New Prescriptions   No medications on file     Note:  This document was prepared using Dragon voice recognition software and may include unintentional dictation errors.    Rebecka Apley, MD 03/23/16 0500

## 2016-03-23 NOTE — Progress Notes (Signed)
[facility] [place] [facility]   PATIENT NAME: Daisy [name]    MR#:  [id number]  DATE OF BIRTH:  Jan 31, 1966  SUBJECTIVE:  CHIEF COMPLAINT:   Chief Complaint  Patient presents with  . Abdominal Pain  . Nausea   - Morbidly obese patient, admitted for nausea/vomiting and abdominal pain - sleepy this morning As didn't sleep well last night. - Blood pressure is elevated  REVIEW OF SYSTEMS:  Review of Systems  Constitutional: Negative for chills and fever.  HENT: Negative for ear discharge, ear pain and nosebleeds.   Eyes: Negative for blurred vision and double vision.  Respiratory: Negative for cough, shortness of breath and wheezing.   Cardiovascular: Negative for chest pain, palpitations and leg swelling.  Gastrointestinal: Positive for abdominal pain, nausea and vomiting. Negative for constipation and diarrhea.  Genitourinary: Negative for dysuria and urgency.  Musculoskeletal: Negative for myalgias.  Neurological: Negative for dizziness, sensory change, speech change, focal weakness, seizures, weakness and headaches.  Psychiatric/Behavioral: Negative for depression.    DRUG ALLERGIES:   Allergies  Allergen Reactions  . Acetaminophen-Codeine Hives  . Codeine Hives  . Ibuprofen Nausea And Vomiting  . Morphine Nausea And Vomiting  . Propoxyphene Other (See Comments)    Reaction:  GI upset   . Sulfa Antibiotics Hives  . Duloxetine Rash and Other (See Comments)    Yellowing of the skin    VITALS:  Blood pressure (!) 195/97, pulse 86, temperature 98.1 F (36.7 C), temperature source Oral, resp. rate (!) 25, height 5\' 5"  (1.651 m), weight 127.6 kg (281 lb 3.2 oz), SpO2 100 %.  PHYSICAL EXAMINATION:  Physical Exam  GENERAL:  50 y.o.-year-old Morbidly obese patient lying in the bed with no acute distress.  EYES: Pupils equal, round, reactive to light and accommodation. No scleral icterus. Extraocular muscles intact.  HEENT: Head atraumatic,  normocephalic. Oropharynx and nasopharynx clear.  NECK:  Supple, no jugular venous distention. No thyroid enlargement, no tenderness.  LUNGS: Normal breath sounds bilaterally, no wheezing, rales,rhonchi or crepitation. No use of accessory muscles of respiration. Decreased bibasilar breath sounds CARDIOVASCULAR: S1, S2 normal. No murmurs, rubs, or gallops.  ABDOMEN: Abdomen is obese, tender in the right upper quadrant region, no guarding or rigidity. Soft on exam. Bowel sounds present. No organomegaly or mass.  EXTREMITIES: No pedal edema, cyanosis, or clubbing.  NEUROLOGIC: Cranial nerves II through XII are intact. Muscle strength 5/5 in all extremities. Sensation intact. Gait not checked.  PSYCHIATRIC: The patient is alert and oriented x 3.  SKIN: No obvious rash, lesion, or ulcer.    LABORATORY PANEL:   CBC  Recent Labs Lab 03/22/16 2340  WBC 12.7*  HGB 13.8  HCT 40.4  PLT 416   ------------------------------------------------------------------------------------------------------------------  Chemistries   Recent Labs Lab 03/22/16 2340  NA 135  K 3.6  CL 100*  CO2 26  GLUCOSE 162*  BUN 7  CREATININE 0.86  CALCIUM 9.3  AST 14*  ALT 13*  ALKPHOS 58  BILITOT 0.7   ------------------------------------------------------------------------------------------------------------------  Cardiac Enzymes  Recent Labs Lab 03/22/16 2340  TROPONINI <0.03   ------------------------------------------------------------------------------------------------------------------  RADIOLOGY:  Dg Abd Acute W/chest  Result Date: 03/21/2016 CLINICAL DATA:  Acute onset of generalized abdominal pain and vomiting. Diarrhea. Initial encounter. EXAM: DG ABDOMEN ACUTE W/ 1V CHEST COMPARISON:  Chest or abdominal radiographs performed 03/12/2016 FINDINGS: The lungs are well-aerated. Mild bibasilar atelectasis is noted. There is no evidence of pleural effusion or pneumothorax. The  cardiomediastinal silhouette is mildly  enlarged. The visualized bowel gas pattern is unremarkable. Scattered stool and air are seen within the colon; there is no evidence of small bowel dilatation to suggest obstruction. No free intra-abdominal air is identified on the provided upright view. Clips are noted within the right upper quadrant, reflecting prior cholecystectomy. No acute osseous abnormalities are seen; the sacroiliac joints are unremarkable in appearance. IMPRESSION: 1. Unremarkable bowel gas pattern; no free intra-abdominal air seen. Small amount of stool noted in the colon. 2. Mild bibasilar atelectasis noted. 3. Mild cardiomegaly. Electronically Signed   By: Roanna RaiderJeffery  Chang M.D.   On: 03/21/2016 23:54    EKG:   Orders placed or performed during the hospital encounter of 03/22/16  . EKG 12-Lead  . EKG 12-Lead    ASSESSMENT AND PLAN:   50 year old morbidly obese female with past medical history significant for diabetes, gastroparesis, hypertension and migraine headaches presents to the hospital secondary to nausea vomiting and abdominal pain. Noted to have hypertensive urgency  #1 abdominal pain with nausea and vomiting-likely worsening of gastroparesis. -Abdominal pain is more secondary to muscular pain from repeated vomiting. -KUB is negative. Abdomen is soft only tender in right upper quadrant, patient is status post cholecystectomy -On clear liquid diet -Pain medications. Control nausea and vomiting. Gentle hydration. -Reglan started every 6 hours, also has Phenergan when necessary -If abdominal pain persists in spite of nausea vomiting improving, then will do a CT of her abdomen.  #2 hypertensive urgency-blood pressure is elevated. -On IV labetalol when necessary. 1 dose of hydralazine IV stat. -Added clonidine patch due to her nausea/vomiting. Already on Norvasc, metoprolol, lisinopril and Lasix.  #3 depression and anxiety-and tinea home medications. On trazodone, Lamictal  and Xanax.  #4 GERD-Protonix and sucralfate  #5 obesity-lifestyle changes needed.  #6 DVT prophylaxis-weight adjusted Lovenox     All the records are reviewed and case discussed with Care Management/Social Workerr. Management plans discussed with the patient, family and they are in agreement.  CODE STATUS: Full code  TOTAL TIME TAKING CARE OF THIS PATIENT: 38 minutes.   POSSIBLE D/C tomorrow, DEPENDING ON CLINICAL CONDITION.   Avleen Bordwell M.D on 03/23/2016 at 8:22 AM  Between 7am to 6pm - Pager - [phone]  After 6pm go to [email or web address] - Social research officer, governmentpassword EPAS [email or web address]  [facility]  Office  [phone]  CC: Primary care physician; Mick SellFITZGERALD, DAVID P, MD

## 2016-03-23 NOTE — ED Notes (Signed)
Denies CP, SOB, LOC or recent falls.  C/O abd pain and persistent n/v/d since 10/8 w/ no relief from medications.  Pt A/OX4, reports taking home medications.

## 2016-03-23 NOTE — Progress Notes (Signed)
03/23/2016  Pt's peripheral IV came out of her hand.  Myself and two other nurses as well as the administrative coordinator attempted to insert another IV.  All attempts were unsuccessful.  Dr. Nemiah CommanderKalisetti suggested that [facility] could insert an IV.  [facility] with [facility].  They will insert an IV ~ 19:30 this evening.  Notified the pt.  Will change 18:00 dose of Reglan to P.O. Route at Dr. Prudencio PairKalisetti's instructions.  Bradly Chrisougherty, Guynell Kleiber E, RN

## 2016-03-23 NOTE — Care Management Obs Status (Signed)
MEDICARE OBSERVATION STATUS NOTIFICATION   Patient Details  Name: Daisy [name] MRN: [id number] Date of Birth: 11-01-1965   Medicare Observation Status Notification Given:  Yes    Collie SiadAngela Kapil Petropoulos, RN 03/23/2016, 10:29 AM

## 2016-03-23 NOTE — Progress Notes (Addendum)
Pt arrived to room 211 via stretcher. Pt A/Ox 4. Complaining of "8/10" abd "tenderness." Vomitted x1 clear liquid. BP 186/97, HR 85. Denies any neuro sx except headache. Medicated for nausea and elevated BP. Administered xanax per pt request. Will report to oncoming RN.

## 2016-03-23 NOTE — ED Notes (Signed)
Given both nitro oint. And hydralazine per Dr. Sheryle Hailiamond.

## 2016-03-23 NOTE — H&P (Signed)
Daisy [name] is an 50 y.o. female.   Chief Complaint: Vomiting HPI: The patient with past medical history of migraine headaches as well as gastroparesis presents to the emergency department complaining of intractable nausea and vomiting. She also complains of headache. The patient was seen in the emergency department yesterday for elevated blood pressure. Today, the patient's blood pressure is again elevated greater than 200/100. After multiple doses of antiemetics as well as IV antihypertensives her pressure remained elevated which prompted the emergency department staff to call for admission. The patient denies chest pain or shortness of breath.  Past Medical History:  Diagnosis Date  . Anxiety   . Depression   . Diabetes mellitus without complication ([place])   . GERD (gastroesophageal reflux disease)   . Hypertension   . Insomnia   . Migraine   . Obesity     Past Surgical History:  Procedure Laterality Date  . ABDOMINAL HYSTERECTOMY    . APPENDECTOMY    . APPLICATION OF WOUND VAC    . CHOLECYSTECTOMY    . HERNIA REPAIR    . KNEE SURGERY      Family History  Problem Relation Age of Onset  . Arthritis Mother   . Depression Mother   . Diabetes Mother   . Heart disease Mother   . Hyperlipidemia Mother   . Hypertension Mother   . Stroke Mother   . Arthritis Father   . Diabetes Father   . Hearing loss Father   . Heart disease Father   . Hyperlipidemia Father   . Hypertension Father    Social History:  reports that she has never smoked. She has never used smokeless tobacco. She reports that she drinks alcohol. She reports that she does not use drugs.  Allergies:  Allergies  Allergen Reactions  . Acetaminophen-Codeine Hives  . Codeine Hives  . Ibuprofen Nausea And Vomiting  . Morphine Nausea And Vomiting  . Propoxyphene Other (See Comments)    Reaction:  GI upset   . Sulfa Antibiotics Hives  . Duloxetine Rash and Other (See Comments)    Yellowing of the skin     Medications Prior to Admission  Medication Sig Dispense Refill  . alprazolam (XANAX) 2 MG tablet Take 2 mg by mouth 3 (three) times daily as needed for anxiety.     . dicyclomine (BENTYL) 20 MG tablet Take 1 tablet (20 mg total) by mouth 3 (three) times daily as needed for spasms. 20 tablet 0  . FLUoxetine (PROZAC) 20 MG capsule Take 20 mg by mouth daily.    . furosemide (LASIX) 20 MG tablet Take 20 mg by mouth daily.    Marland Kitchen lamoTRIgine (LAMICTAL) 100 MG tablet Take 100 mg by mouth daily.    Marland Kitchen lisinopril (PRINIVIL,ZESTRIL) 20 MG tablet Take 20 mg by mouth daily.    . metFORMIN (GLUCOPHAGE) 500 MG tablet Take 500 mg by mouth daily.    . metoprolol succinate (TOPROL-XL) 25 MG 24 hr tablet Take 50 mg by mouth daily.    . ondansetron (ZOFRAN) 4 MG tablet Take 4 mg by mouth every 8 (eight) hours as needed for nausea.    . Oxycodone HCl 10 MG TABS Limit  4 - 7 tabs by mouth per day if tolerated 200 tablet 0  . pantoprazole (PROTONIX) 40 MG tablet Take 40 mg by mouth 2 (two) times daily.     . potassium chloride SA (K-DUR,KLOR-CON) 20 MEQ tablet Take 20 mEq by mouth daily.    Marland Kitchen  promethazine (PHENERGAN) 12.5 MG tablet Take 1 tablet (12.5 mg total) by mouth every 6 (six) hours as needed for nausea or vomiting. 12 tablet 0  . ranitidine (ZANTAC) 150 MG tablet Take 1 tablet (150 mg total) by mouth at bedtime. 30 tablet 1  . sucralfate (CARAFATE) 1 g tablet Take 1 tablet (1 g total) by mouth 4 (four) times daily. 60 tablet 0  . tiZANidine (ZANAFLEX) 4 MG tablet Take 4-12 mg by mouth 3 (three) times daily. 1-3 tablets per day as tolerated    . zolpidem (AMBIEN CR) 12.5 MG CR tablet Take 12.5 mg by mouth at bedtime as needed for sleep.      Results for orders placed or performed during the hospital encounter of 03/22/16 (from the past 48 hour(s))  Glucose, capillary     Status: Abnormal   Collection Time: 03/22/16 10:50 PM  Result Value Ref Range   Glucose-Capillary 161 (H) 65 - 99 mg/dL  CBC      Status: Abnormal   Collection Time: 03/22/16 11:40 PM  Result Value Ref Range   WBC 12.7 (H) 3.6 - 11.0 K/uL   RBC 5.01 3.80 - 5.20 MIL/uL   Hemoglobin 13.8 12.0 - 16.0 g/dL   HCT 40.4 35.0 - 47.0 %   MCV 80.7 80.0 - 100.0 fL   MCH 27.5 26.0 - 34.0 pg   MCHC 34.1 32.0 - 36.0 g/dL   RDW 16.9 (H) 11.5 - 14.5 %   Platelets 416 150 - 440 K/uL  Comprehensive metabolic panel     Status: Abnormal   Collection Time: 03/22/16 11:40 PM  Result Value Ref Range   Sodium 135 135 - 145 mmol/L   Potassium 3.6 3.5 - 5.1 mmol/L   Chloride 100 (L) 101 - 111 mmol/L   CO2 26 22 - 32 mmol/L   Glucose, Bld 162 (H) 65 - 99 mg/dL   BUN 7 6 - 20 mg/dL   Creatinine, Ser 0.86 0.44 - 1.00 mg/dL   Calcium 9.3 8.9 - 10.3 mg/dL   Total Protein 9.0 (H) 6.5 - 8.1 g/dL   Albumin 4.4 3.5 - 5.0 g/dL   AST 14 (L) 15 - 41 U/L   ALT 13 (L) 14 - 54 U/L   Alkaline Phosphatase 58 38 - 126 U/L   Total Bilirubin 0.7 0.3 - 1.2 mg/dL   GFR calc non Af Amer >60 >60 mL/min   GFR calc Af Amer >60 >60 mL/min    Comment: (NOTE) The eGFR has been calculated using the CKD EPI equation. This calculation has not been validated in all clinical situations. eGFR's persistently <60 mL/min signify possible Chronic Kidney Disease.    Anion gap 9 5 - 15  Lipase, blood     Status: None   Collection Time: 03/22/16 11:40 PM  Result Value Ref Range   Lipase 16 11 - 51 U/L  Troponin I     Status: None   Collection Time: 03/22/16 11:40 PM  Result Value Ref Range   Troponin I <0.03 <0.03 ng/mL   Dg Abd Acute W/chest  Result Date: 03/21/2016 CLINICAL DATA:  Acute onset of generalized abdominal pain and vomiting. Diarrhea. Initial encounter. EXAM: DG ABDOMEN ACUTE W/ 1V CHEST COMPARISON:  Chest or abdominal radiographs performed 03/12/2016 FINDINGS: The lungs are well-aerated. Mild bibasilar atelectasis is noted. There is no evidence of pleural effusion or pneumothorax. The cardiomediastinal silhouette is mildly enlarged. The visualized  bowel gas pattern is unremarkable. Scattered stool and air are seen  within the colon; there is no evidence of small bowel dilatation to suggest obstruction. No free intra-abdominal air is identified on the provided upright view. Clips are noted within the right upper quadrant, reflecting prior cholecystectomy. No acute osseous abnormalities are seen; the sacroiliac joints are unremarkable in appearance. IMPRESSION: 1. Unremarkable bowel gas pattern; no free intra-abdominal air seen. Small amount of stool noted in the colon. 2. Mild bibasilar atelectasis noted. 3. Mild cardiomegaly. Electronically Signed   By: Garald Balding M.D.   On: 03/21/2016 23:54    Review of Systems  Constitutional: Negative for chills and fever.  HENT: Negative for sore throat and tinnitus.   Eyes: Negative for blurred vision, double vision and redness.  Respiratory: Negative for cough and shortness of breath.   Cardiovascular: Negative for chest pain, palpitations, orthopnea and PND.  Gastrointestinal: Positive for abdominal pain, nausea and vomiting. Negative for diarrhea.  Genitourinary: Negative for dysuria, frequency and urgency.  Musculoskeletal: Negative for joint pain and myalgias.  Skin: Negative for rash.       No lesions  Neurological: Positive for headaches. Negative for speech change, focal weakness and weakness.  Endo/Heme/Allergies: Does not bruise/bleed easily.       No temperature intolerance  Psychiatric/Behavioral: Negative for depression and suicidal ideas.    Blood pressure (!) 186/97, pulse 85, temperature 98.1 F (36.7 C), temperature source Oral, resp. rate (!) 25, height _0  (1.651 m), weight 127.6 kg (281 lb 3.2 oz), SpO2 100 %. Physical Exam  Vitals reviewed. Constitutional: She is oriented to person, place, and time. She appears well-developed and well-nourished. No distress.  HENT:  Head: Normocephalic and atraumatic.  Mouth/Throat: Oropharynx is clear and moist.  Eyes: Conjunctivae  and EOM are normal. Pupils are equal, round, and reactive to light. No scleral icterus.  Neck: Normal range of motion. Neck supple. No JVD present. No tracheal deviation present. No thyromegaly present.  Cardiovascular: Normal rate, regular rhythm and normal heart sounds.  Exam reveals no gallop and no friction rub.   No murmur heard. Respiratory: Effort normal and breath sounds normal.  GI: Soft. Bowel sounds are normal. She exhibits no distension. There is no tenderness.  Genitourinary:  Genitourinary Comments: Deferred  Musculoskeletal: Normal range of motion. She exhibits no edema.  Lymphadenopathy:    She has no cervical adenopathy.  Neurological: She is alert and oriented to person, place, and time. No cranial nerve deficit. She exhibits normal muscle tone.  Skin: Skin is warm and dry. No rash noted. No erythema.  Psychiatric: She has a normal mood and affect. Her behavior is normal. Judgment and thought content normal.     Assessment/Plan This is a 50 year old female admitted for hypertensive urgency as well as intractable nausea and vomiting. 1. Hypertensive urgency: After assuming care of the patient I ordered hydralazine 15 mg IV as well as half an inch of Nitropaste. The patient's pressure decreased initially to systolic 944 then decreased to the 140s which time I ordered the Nitropaste removed. The patient is neurologically intact. We will continue lisinopril and metoprolol per home regimen. I have added Norvasc to her daily regimen as well. 2. Intractable nausea and vomiting: Secondary to gastroparesis. We'll treat symptomatically. 3. Diabetes mellitus type II: Hold metformin for now (the patient only takes it as needed at home). Checking a lipid A1c. Sliding scale insulin as needed. 4. Migraine headaches: Continue nortriptyline as needed 5. Morbid obesity: BMI is 46.9; encourage healthy diet and exercise 6. DVT prophylaxis: Lovenox 7. GI  prophylaxis: Pantoprazole per home  regimen The patient is a full code. Time spent on admission orders and patient care approximately 45 minutes  Harrie Foreman, MD 03/23/2016, 7:00 AM

## 2016-03-23 NOTE — ED Notes (Signed)
Nitro oint. Removed per Dr. Sheryle Hailiamond.

## 2016-03-24 ENCOUNTER — Observation Stay: Payer: Medicare Other

## 2016-03-24 DIAGNOSIS — E1143 Type 2 diabetes mellitus with diabetic autonomic (poly)neuropathy: Secondary | ICD-10-CM | POA: Diagnosis not present

## 2016-03-24 LAB — CBC
HEMATOCRIT: 40 % (ref 35.0–47.0)
HEMOGLOBIN: 13.1 g/dL (ref 12.0–16.0)
MCH: 27 pg (ref 26.0–34.0)
MCHC: 32.8 g/dL (ref 32.0–36.0)
MCV: 82.5 fL (ref 80.0–100.0)
Platelets: 393 10*3/uL (ref 150–440)
RBC: 4.85 MIL/uL (ref 3.80–5.20)
RDW: 17.7 % — ABNORMAL HIGH (ref 11.5–14.5)
WBC: 9.6 10*3/uL (ref 3.6–11.0)

## 2016-03-24 LAB — GLUCOSE, CAPILLARY
Glucose-Capillary: 144 mg/dL — ABNORMAL HIGH (ref 65–99)
Glucose-Capillary: 141 mg/dL — ABNORMAL HIGH (ref 65–99)
Glucose-Capillary: 108 mg/dL — ABNORMAL HIGH (ref 65–99)
Glucose-Capillary: 118 mg/dL — ABNORMAL HIGH (ref 65–99)

## 2016-03-24 LAB — BASIC METABOLIC PANEL
ANION GAP: 8 (ref 5–15)
BUN: 11 mg/dL (ref 6–20)
CALCIUM: 8.9 mg/dL (ref 8.9–10.3)
CO2: 28 mmol/L (ref 22–32)
Chloride: 100 mmol/L — ABNORMAL LOW (ref 101–111)
Creatinine, Ser: 0.86 mg/dL (ref 0.44–1.00)
GFR calc Af Amer: >60 mL/min (ref >60)
GFR calc non Af Amer: >60 mL/min (ref >60)
GLUCOSE: 109 mg/dL — AB (ref 65–99)
Potassium: 3.2 mmol/L — ABNORMAL LOW (ref 3.5–5.1)
Sodium: 136 mmol/L (ref 135–145)

## 2016-03-24 LAB — HEMOGLOBIN A1C
Hgb A1c MFr Bld: 6.2 % — ABNORMAL HIGH (ref 4.8–5.6)
Mean Plasma Glucose: 131 mg/dL

## 2016-03-24 LAB — MAGNESIUM: MAGNESIUM: 1.9 mg/dL (ref 1.7–2.4)

## 2016-03-24 MED ORDER — IOPAMIDOL (ISOVUE-300) INJECTION 61%: 30.0000 mL | Freq: Once | INTRAVENOUS | Status: DC

## 2016-03-24 MED ORDER — HYDROMORPHONE HCL 1 MG/ML IJ SOLN
1.0000 mg | Freq: Once | INTRAMUSCULAR | Status: AC
  Administered 2016-03-24: 1 mg via INTRAVENOUS
  Filled 2016-03-24: qty 1

## 2016-03-24 MED ORDER — FAMOTIDINE IN NACL 20-0.9 MG/50ML-% IV SOLN
20.0000 mg | Freq: Two times a day (BID) | INTRAVENOUS | Status: DC
  Administered 2016-03-24 (×2): 20 mg via INTRAVENOUS
  Filled 2016-03-24 (×4): qty 50

## 2016-03-24 MED ORDER — IOPAMIDOL (ISOVUE-370) INJECTION 76%
100.0000 mL | Freq: Once | INTRAVENOUS | Status: AC | PRN
  Administered 2016-03-24: 125 mL via INTRAVENOUS

## 2016-03-24 MED ORDER — PROMETHAZINE HCL 25 MG/ML IJ SOLN
12.5000 mg | Freq: Once | INTRAMUSCULAR | Status: AC
  Administered 2016-03-24: 12.5 mg via INTRAVENOUS
  Filled 2016-03-24: qty 1

## 2016-03-24 NOTE — Progress Notes (Signed)
[facility] [place] at [facility]   PATIENT NAME: Daisy [name]    MR#:  [id number]  DATE OF BIRTH:  11-05-1965  SUBJECTIVE:  CHIEF COMPLAINT:   Chief Complaint  Patient presents with  . Abdominal Pain  . Nausea   - Morbidly obese patient, admitted for nausea/vomiting and abdominal pain - sleepy this morning As didn't sleep well last night. - Blood pressure is elevated  REVIEW OF SYSTEMS:  Review of Systems  Constitutional: Negative for chills and fever.  HENT: Negative for ear discharge, ear pain and nosebleeds.   Eyes: Negative for blurred vision and double vision.  Respiratory: Negative for cough, shortness of breath and wheezing.   Cardiovascular: Negative for chest pain, palpitations and leg swelling.  Gastrointestinal: Positive for abdominal pain, nausea and vomiting. Negative for constipation and diarrhea.  Genitourinary: Negative for dysuria and urgency.  Musculoskeletal: Negative for myalgias.  Neurological: Negative for dizziness, sensory change, speech change, focal weakness, seizures, weakness and headaches.  Psychiatric/Behavioral: Negative for depression.    DRUG ALLERGIES:   Allergies  Allergen Reactions  . Acetaminophen-Codeine Hives  . Codeine Hives  . Ibuprofen Nausea And Vomiting  . Morphine Nausea And Vomiting  . Propoxyphene Other (See Comments)    Reaction:  GI upset   . Sulfa Antibiotics Hives  . Duloxetine Rash and Other (See Comments)    Yellowing of the skin    VITALS:  Blood pressure (!) 142/88, pulse 91, temperature 98 F (36.7 C), temperature source Oral, resp. rate 18, height 5\' 5"  (1.651 m), weight 127.6 kg (281 lb 3.2 oz), SpO2 97 %.  PHYSICAL EXAMINATION:  Physical Exam  GENERAL:  50 y.o.-year-old Morbidly obese patient lying in the bed with no acute distress.  EYES: Pupils equal, round, reactive to light and accommodation. No scleral icterus. Extraocular muscles intact.  HEENT: Head atraumatic,  normocephalic. Oropharynx and nasopharynx clear.  NECK:  Supple, no jugular venous distention. No thyroid enlargement, no tenderness.  LUNGS: Normal breath sounds bilaterally, no wheezing, rales,rhonchi or crepitation. No use of accessory muscles of respiration. Decreased bibasilar breath sounds CARDIOVASCULAR: S1, S2 normal. No murmurs, rubs, or gallops.  ABDOMEN: Abdomen is obese, tender in the right upper quadrant region and epigastric area, no guarding or rigidity. Soft on exam. Bowel sounds present. No organomegaly or mass.  EXTREMITIES: No pedal edema, cyanosis, or clubbing.  NEUROLOGIC: Cranial nerves II through XII are intact. Muscle strength 5/5 in all extremities. Sensation intact. Gait not checked.  PSYCHIATRIC: The patient is alert and oriented x 3.  SKIN: No obvious rash, lesion, or ulcer.    LABORATORY PANEL:   CBC  Recent Labs Lab 03/24/16 0429  WBC 9.6  HGB 13.1  HCT 40.0  PLT 393   ------------------------------------------------------------------------------------------------------------------  Chemistries   Recent Labs Lab 03/22/16 2340 03/24/16 0429  NA 135 136  K 3.6 3.2*  CL 100* 100*  CO2 26 28  GLUCOSE 162* 109*  BUN 7 11  CREATININE 0.86 0.86  CALCIUM 9.3 8.9  MG  --  1.9  AST 14*  --   ALT 13*  --   ALKPHOS 58  --   BILITOT 0.7  --    ------------------------------------------------------------------------------------------------------------------  Cardiac Enzymes  Recent Labs Lab 03/22/16 2340  TROPONINI <0.03   ------------------------------------------------------------------------------------------------------------------  RADIOLOGY:  No results found.  EKG:   Orders placed or performed during the hospital encounter of 03/22/16  . EKG 12-Lead  . EKG 12-Lead  . EKG 12-Lead  . EKG  12-Lead  . EKG 12-Lead  . EKG 12-Lead    ASSESSMENT AND PLAN:   50 year old morbidly obese female with past medical history significant for  diabetes, gastroparesis, hypertension and migraine headaches presents to the hospital secondary to nausea vomiting and abdominal pain. Noted to have hypertensive urgency  #1 abdominal pain with nausea and vomiting-likely worsening of gastroparesis and gastritis - worsening pain and nausea today- CT abdomen ordered - GI consulted- unfortunately no GI consult. -KUB is negative.  patient is status post cholecystectomy -On clear liquid diet -Pain medications. Control nausea and vomiting. Gentle hydration. -Reglan started every 6 hours, also has Phenergan when necessary  #2 hypertensive urgency-blood pressure is elevated. -On IV labetalol when necessary. -Added clonidine patch due to her nausea/vomiting. Already on Norvasc, metoprolol, lisinopril and Lasix.  #3 depression and anxiety-continue home medications. On trazodone, Lamictal and Xanax.  #4 GERD-Protonix and sucralfate  #5 obesity-lifestyle changes needed.  #6 DVT prophylaxis-weight adjusted Lovenox   Discharge once nausea/abdominal pain improve .  All the records are reviewed and case discussed with Care Management/Social Workerr. Management plans discussed with the patient, family and they are in agreement.  CODE STATUS: Full code  TOTAL TIME TAKING CARE OF THIS PATIENT: 38 minutes.   POSSIBLE D/C 1-2 days, DEPENDING ON CLINICAL CONDITION.   Daisy [name] M.D on 03/24/2016 at 1:00 PM  Between 7am to 6pm - Pager - [phone]  After 6pm go to [email or web address] - Social research officer, government  [facility]  Office  [phone]  CC: Primary care physician; Daisy Sell, MD

## 2016-03-25 DIAGNOSIS — E1143 Type 2 diabetes mellitus with diabetic autonomic (poly)neuropathy: Secondary | ICD-10-CM | POA: Diagnosis not present

## 2016-03-25 LAB — BASIC METABOLIC PANEL
Anion gap: 7 (ref 5–15)
BUN: 13 mg/dL (ref 6–20)
CHLORIDE: 100 mmol/L — AB (ref 101–111)
CO2: 30 mmol/L (ref 22–32)
CREATININE: 0.88 mg/dL (ref 0.44–1.00)
Calcium: 8.9 mg/dL (ref 8.9–10.3)
GFR calc Af Amer: >60 mL/min (ref >60)
GLUCOSE: 99 mg/dL (ref 65–99)
POTASSIUM: 3.1 mmol/L — AB (ref 3.5–5.1)
SODIUM: 137 mmol/L (ref 135–145)

## 2016-03-25 LAB — GLUCOSE, CAPILLARY
Glucose-Capillary: 114 mg/dL — ABNORMAL HIGH (ref 65–99)
Glucose-Capillary: 106 mg/dL — ABNORMAL HIGH (ref 65–99)
Glucose-Capillary: 83 mg/dL (ref 65–99)
Glucose-Capillary: 125 mg/dL — ABNORMAL HIGH (ref 65–99)

## 2016-03-25 MED ORDER — FAMOTIDINE 20 MG PO TABS
20.0000 mg | ORAL_TABLET | Freq: Two times a day (BID) | ORAL | Status: DC
  Administered 2016-03-25 – 2016-03-26 (×3): 20 mg via ORAL
  Filled 2016-03-25 (×3): qty 1

## 2016-03-25 NOTE — Progress Notes (Signed)
[facility] [place] at [facility]   PATIENT NAME: Daisy [name]    MR#:  [id number]  DATE OF BIRTH:  [birth year]  SUBJECTIVE:  CHIEF COMPLAINT:   Chief Complaint  Patient presents with  . Abdominal Pain  . Nausea   - Morbidly obese patient, admitted for nausea/vomiting and abdominal pain - - Blood pressure is stable now. - nausea and vomit is under control now, no pain. She is NPO since last night, want to try liquid diet now.  REVIEW OF SYSTEMS:  Review of Systems  Constitutional: Negative for chills and fever.  HENT: Negative for ear discharge, ear pain and nosebleeds.   Eyes: Negative for blurred vision and double vision.  Respiratory: Negative for cough, shortness of breath and wheezing.   Cardiovascular: Negative for chest pain, palpitations and leg swelling.  Gastrointestinal: Positive for abdominal pain, nausea and vomiting. Negative for constipation and diarrhea.  Genitourinary: Negative for dysuria and urgency.  Musculoskeletal: Negative for myalgias.  Neurological: Negative for dizziness, sensory change, speech change, focal weakness, seizures, weakness and headaches.  Psychiatric/Behavioral: Negative for depression.    DRUG ALLERGIES:   Allergies  Allergen Reactions  . Acetaminophen-Codeine Hives  . Codeine Hives  . Ibuprofen Nausea And Vomiting  . Morphine Nausea And Vomiting  . Propoxyphene Other (See Comments)    Reaction:  GI upset   . Sulfa Antibiotics Hives  . Duloxetine Rash and Other (See Comments)    Yellowing of the skin    VITALS:  Blood pressure (!) 153/82, pulse 72, temperature 98.2 F (36.8 C), temperature source Oral, resp. rate (!) 22, height 5\' 5"  (1.651 m), weight 126.7 kg (279 lb 4.8 oz), SpO2 93 %.  PHYSICAL EXAMINATION:  Physical Exam  GENERAL:  50 y.o.-year-old Morbidly obese patient lying in the bed with no acute distress.  EYES: Pupils equal, round, reactive to light and accommodation. No scleral icterus.  Extraocular muscles intact.  HEENT: Head atraumatic, normocephalic. Oropharynx and nasopharynx clear.  NECK:  Supple, no jugular venous distention. No thyroid enlargement, no tenderness.  LUNGS: Normal breath sounds bilaterally, no wheezing, rales,rhonchi or crepitation. No use of accessory muscles of respiration. Decreased bibasilar breath sounds CARDIOVASCULAR: S1, S2 normal. No murmurs, rubs, or gallops.  ABDOMEN: Abdomen is obese, tender in the right upper quadrant region and epigastric area, no guarding or rigidity. Soft on exam. Bowel sounds present. No organomegaly or mass.  EXTREMITIES: No pedal edema, cyanosis, or clubbing.  NEUROLOGIC: Cranial nerves II through XII are intact. Muscle strength 5/5 in all extremities. Sensation intact. Gait not checked.  PSYCHIATRIC: The patient is alert and oriented x 3.  SKIN: No obvious rash, lesion, or ulcer.    LABORATORY PANEL:   CBC  Recent Labs Lab 03/24/16 0429  WBC 9.6  HGB 13.1  HCT 40.0  PLT 393   ------------------------------------------------------------------------------------------------------------------  Chemistries   Recent Labs Lab 03/22/16 2340 03/24/16 0429 03/25/16 0435  NA 135 136 137  K 3.6 3.2* 3.1*  CL 100* 100* 100*  CO2 26 28 30   GLUCOSE 162* 109* 99  BUN 7 11 13   CREATININE 0.86 0.86 0.88  CALCIUM 9.3 8.9 8.9  MG  --  1.9  --   AST 14*  --   --   ALT 13*  --   --   ALKPHOS 58  --   --   BILITOT 0.7  --   --    ------------------------------------------------------------------------------------------------------------------  Cardiac Enzymes  Recent Labs Lab 03/22/16 2340  TROPONINI <0.03   ------------------------------------------------------------------------------------------------------------------  RADIOLOGY:  Ct Abdomen Pelvis W Contrast  Result Date: 03/24/2016 CLINICAL DATA:  Nausea and vomiting for 2 days, status post cholecystectomy, hysterectomy, appendectomy, hernia  repair, possible gross right is EXAM: CT ABDOMEN AND PELVIS WITH CONTRAST TECHNIQUE: Multidetector CT imaging of the abdomen and pelvis was performed using the standard protocol following bolus administration of intravenous contrast. CONTRAST:  125 cc Isovue COMPARISON:  CT scan 01/27/2016 FINDINGS: Lower chest: Lung bases are unremarkable. Small hiatal hernia is noted. There are artifacts from patient's large body habitus. Hepatobiliary: Enhanced liver is unremarkable. Status postcholecystectomy. Pancreas: Enhanced pancreas is unremarkable. Spleen: Enhanced spleen is unremarkable. Adrenals/Urinary Tract: No adrenal gland mass. Enhanced kidneys are symmetrical in size. No hydronephrosis or hydroureter. Delayed renal images shows bilateral renal symmetrical excretion. Bilateral visualized proximal ureter is unremarkable. The urinary bladder is under distended grossly unremarkable. Stomach/Bowel: Oral contrast material was given to the patient. There is no small bowel obstruction. Again noted chronic scarring and subcutaneous stranding in right lower anterior abdominal wall postsurgical in nature. No evidence of subcutaneous abscess. No pericecal inflammation. The terminal ileum is unremarkable. The patient is status post appendectomy. Some gas noted in distal sigmoid colon and rectum. No colitis or diverticulitis. No distal colonic obstruction Vascular/Lymphatic: No aortic aneurysm. No retroperitoneal or mesenteric adenopathy. Reproductive: The patient is status post hysterectomy. There is a left ovarian cyst measures 2.5 cm. Second left ovarian cyst measures 1.8 cm. Other: No ascites or free abdominal air. There is small amount of subcutaneous air in left anterior pelvic wall axial image 75 probable subcutaneous injection no inguinal adenopathy. Musculoskeletal: Sagittal images of the spine shows no destructive bony lesions. Mild degenerative changes lower thoracic spine. IMPRESSION: 1. There is no evidence of acute  inflammatory process within abdomen. 2. Stable postsurgical changes and scarring in right lower anterior abdominal wall. 3. No small bowel or colonic obstruction. Status post hysterectomy. Status post cholecystectomy. Status post appendectomy. 4. There is a left ovarian cyst measures 2.5 cm. Second left ovarian cyst measures 1.8 cm. Electronically Signed   By: Natasha Mead M.D.   On: 03/24/2016 16:56    EKG:   Orders placed or performed during the hospital encounter of 03/22/16  . EKG 12-Lead  . EKG 12-Lead  . EKG 12-Lead  . EKG 12-Lead  . EKG 12-Lead  . EKG 12-Lead    ASSESSMENT AND PLAN:   50 year old morbidly obese female with past medical history significant for diabetes, gastroparesis, hypertension and migraine headaches presents to the hospital secondary to nausea vomiting and abdominal pain. Noted to have hypertensive urgency  #1 abdominal pain with nausea and vomiting-likely worsening of gastroparesis and gastritis - worsening pain and nausea today- CT abdomen ordered- negative. - GI consulted- unfortunately no GI consult. -KUB is negative.  patient is status post cholecystectomy - On clear liquid diet -Pain medications. Control nausea and vomiting. Gentle hydration. -Reglan started every 6 hours, also has Phenergan when necessary  Added sucralfate, pt feels little better.  #2 hypertensive urgency-blood pressure is elevated. -On IV labetalol when necessary. -Added clonidine patch due to her nausea/vomiting. Already on Norvasc, metoprolol, lisinopril and Lasix.   BP is stable now.  #3 depression and anxiety-continue home medications. On trazodone, Lamictal and Xanax.  #4 GERD-Protonix and sucralfate  #5 obesity-lifestyle changes needed.  #6 DVT prophylaxis-weight adjusted Lovenox   Discharge once nausea/abdominal pain improve .  All the records are reviewed and case discussed with Care Management/Social Workerr. Management plans discussed  with the patient, family and  they are in agreement.  CODE STATUS: Full code  TOTAL TIME TAKING CARE OF THIS PATIENT: 30 minutes.   POSSIBLE D/C 1-2 days, DEPENDING ON CLINICAL CONDITION.   Altamese Dilling M.D on 03/25/2016 at 1:08 PM  Between 7am to 6pm - Pager - [phone]  After 6pm go to [email or web address] - Social research officer, government  [facility]  Office  [phone]  CC: Primary care physician; Mick Sell, MD

## 2016-03-25 NOTE — Progress Notes (Signed)
Key Points: Use following P&T approved IV to PO antibiotic change policy.  Description contains the criteria that are approved Note: Policy Excludes:  Esophagectomy patientsPHARMACIST - PHYSICIAN COMMUNICATION DR:   Elisabeth PigeonVachhani CONCERNING: IV to Oral Route Change Policy  RECOMMENDATION: This patient is receiving famotidine by the intravenous route.  Based on criteria approved by the Pharmacy and Therapeutics Committee, the intravenous medication(s) is/are being converted to the equivalent oral dose form(s).    DESCRIPTION: These criteria include:  The patient is eating (either orally or via tube) and/or has been taking other orally administered medications for a least 24 hours  The patient has no evidence of active gastrointestinal bleeding or impaired GI absorption (gastrectomy, short bowel, patient on TNA or NPO).  If you have questions about this conversion, please contact the Pharmacy Department   Horris LatinoHolly Gilliam, PharmD Pharmacy Resident 03/25/2016 9:58 AM

## 2016-03-26 DIAGNOSIS — E1143 Type 2 diabetes mellitus with diabetic autonomic (poly)neuropathy: Secondary | ICD-10-CM | POA: Diagnosis not present

## 2016-03-26 LAB — GLUCOSE, CAPILLARY
Glucose-Capillary: 112 mg/dL — ABNORMAL HIGH (ref 65–99)
Glucose-Capillary: 114 mg/dL — ABNORMAL HIGH (ref 65–99)

## 2016-03-26 MED ORDER — SUCRALFATE 1 G PO TABS: 1.0000 g | ORAL_TABLET | Freq: Four times a day (QID) | ORAL | 1 refills | Status: DC

## 2016-03-26 MED ORDER — HYDROCODONE-ACETAMINOPHEN 5-325 MG PO TABS
1.0000 | ORAL_TABLET | Freq: Four times a day (QID) | ORAL | 0 refills | Status: DC | PRN
Start: 2016-03-26 — End: 2016-06-01

## 2016-03-26 MED ORDER — AMLODIPINE BESYLATE 5 MG PO TABS: 5.0000 mg | ORAL_TABLET | Freq: Every day | ORAL | 1 refills | Status: DC

## 2016-03-26 MED ORDER — CLONIDINE HCL 0.1 MG/24HR TD PTWK: 0.1000 mg | MEDICATED_PATCH | TRANSDERMAL | 1 refills | Status: DC

## 2016-03-26 MED ORDER — POTASSIUM CHLORIDE CRYS ER 20 MEQ PO TBCR
40.0000 meq | EXTENDED_RELEASE_TABLET | Freq: Once | ORAL | Status: AC
  Administered 2016-03-26: 40 meq via ORAL
  Filled 2016-03-26: qty 2

## 2016-03-26 MED ORDER — PROMETHAZINE HCL 12.5 MG PO TABS: 12.5000 mg | ORAL_TABLET | Freq: Four times a day (QID) | ORAL | 0 refills | Status: DC | PRN

## 2016-03-26 MED ORDER — PANTOPRAZOLE SODIUM 40 MG PO TBEC: 40.0000 mg | DELAYED_RELEASE_TABLET | Freq: Two times a day (BID) | ORAL | 0 refills | Status: DC

## 2016-03-26 NOTE — Discharge Summary (Signed)
[facility] - [place] [facility]   PATIENT NAME: Daisy [name]    MR#:  [id number]  DATE OF BIRTH:  12/05/1965  DATE OF ADMISSION:  03/22/2016 ADMITTING PHYSICIAN: Arnaldo Natal, MD  DATE OF DISCHARGE: 03/26/2016  PRIMARY CARE PHYSICIAN: Mick Sell, MD    ADMISSION DIAGNOSIS:  Generalized abdominal pain [R10.84] Hypertensive urgency [I16.0] Nausea and vomiting, intractability of vomiting not specified, unspecified vomiting type [R11.2]  DISCHARGE DIAGNOSIS:  Active Problems:   Hypertensive urgency   Gastritis, Gastroparesis.  SECONDARY DIAGNOSIS:   Past Medical History:  Diagnosis Date  . Anxiety   . Depression   . Diabetes mellitus without complication (HCC)   . GERD (gastroesophageal reflux disease)   . Hypertension   . Insomnia   . Migraine   . Obesity     HOSPITAL COURSE:   50 year old morbidly obese female with past medical history significant for diabetes, gastroparesis, hypertension and migraine headaches presents to the hospital secondary to nausea vomiting and abdominal pain. Noted to have hypertensive urgency  #1 abdominal pain with nausea and vomiting-likely worsening of gastroparesis and gastritis - worsening pain and nausea today- CT abdomen ordered- negative. - GI consulted- unfortunately no GI coverage for consult. -KUB is negative.  patient is status post cholecystectomy - On clear liquid diet -Pain medications. Control nausea and vomiting. Gentle hydration. -Reglan started every 6 hours, also has Phenergan when necessary  Added sucralfate, pt feels little better.  tolerated soft diet and feels better.  Advised to follow in GI clinic.  #2 hypertensive urgency-blood pressure is elevated. -On IV labetalol when necessary. -Added clonidine patch due to her nausea/vomiting. Already on Norvasc, metoprolol, lisinopril and Lasix.   BP is stable now.  #3 depression and anxiety-continue home medications. On  trazodone, Lamictal and Xanax.  #4 GERD-Protonix and sucralfate  #5 obesity-lifestyle changes needed.  #6 DVT prophylaxis-weight adjusted Lovenox   DISCHARGE CONDITIONS:   Stable.  CONSULTS OBTAINED:    DRUG ALLERGIES:   Allergies  Allergen Reactions  . Acetaminophen-Codeine Hives  . Codeine Hives  . Ibuprofen Nausea And Vomiting  . Morphine Nausea And Vomiting  . Propoxyphene Other (See Comments)    Reaction:  GI upset   . Sulfa Antibiotics Hives  . Duloxetine Rash and Other (See Comments)    Yellowing of the skin    DISCHARGE MEDICATIONS:   Current Discharge Medication List    START taking these medications   Details  amLODipine (NORVASC) 5 MG tablet Take 1 tablet (5 mg total) by mouth daily. Qty: 30 tablet, Refills: 1    cloNIDine (CATAPRES - DOSED IN MG/24 HR) 0.1 mg/24hr patch Place 1 patch (0.1 mg total) onto the skin once a week. Qty: 4 patch, Refills: 1    HYDROcodone-acetaminophen (NORCO/VICODIN) 5-325 MG tablet Take 1 tablet by mouth every 6 (six) hours as needed for moderate pain. Qty: 20 tablet, Refills: 0      CONTINUE these medications which have CHANGED   Details  pantoprazole (PROTONIX) 40 MG tablet Take 1 tablet (40 mg total) by mouth 2 (two) times daily. Qty: 60 tablet, Refills: 0    promethazine (PHENERGAN) 12.5 MG tablet Take 1 tablet (12.5 mg total) by mouth every 6 (six) hours as needed for refractory nausea / vomiting. Qty: 30 tablet, Refills: 0    sucralfate (CARAFATE) 1 g tablet Take 1 tablet (1 g total) by mouth 4 (four) times daily. Qty: 100 tablet, Refills: 1      CONTINUE these  medications which have NOT CHANGED   Details  alprazolam (XANAX) 2 MG tablet Take 2 mg by mouth 3 (three) times daily as needed for anxiety.     dicyclomine (BENTYL) 20 MG tablet Take 1 tablet (20 mg total) by mouth 3 (three) times daily as needed for spasms. Qty: 20 tablet, Refills: 0    FLUoxetine (PROZAC) 20 MG capsule Take 20 mg by mouth  daily.    furosemide (LASIX) 20 MG tablet Take 20 mg by mouth daily.    lamoTRIgine (LAMICTAL) 100 MG tablet Take 100 mg by mouth daily.    lisinopril (PRINIVIL,ZESTRIL) 20 MG tablet Take 20 mg by mouth daily.    metFORMIN (GLUCOPHAGE) 500 MG tablet Take 500 mg by mouth daily.    metoprolol succinate (TOPROL-XL) 25 MG 24 hr tablet Take 50 mg by mouth daily.    ondansetron (ZOFRAN) 4 MG tablet Take 4 mg by mouth every 8 (eight) hours as needed for nausea.    potassium chloride SA (K-DUR,KLOR-CON) 20 MEQ tablet Take 20 mEq by mouth daily.    tiZANidine (ZANAFLEX) 4 MG tablet Take 4-12 mg by mouth 3 (three) times daily. 1-3 tablets per day as tolerated    zolpidem (AMBIEN CR) 12.5 MG CR tablet Take 12.5 mg by mouth at bedtime as needed for sleep.      STOP taking these medications     Oxycodone HCl 10 MG TABS      ranitidine (ZANTAC) 150 MG tablet      diclofenac sodium (VOLTAREN) 1 % GEL      nortriptyline (PAMELOR) 25 MG capsule      polyethylene glycol (MIRALAX / GLYCOLAX) packet      traZODone (DESYREL) 100 MG tablet          DISCHARGE INSTRUCTIONS:    Follow with GI clinic.  If you experience worsening of your admission symptoms, develop shortness of breath, life threatening emergency, suicidal or homicidal thoughts you must seek medical attention immediately by calling 911 or calling your MD immediately  if symptoms less severe.  You Must read complete instructions/literature along with all the possible adverse reactions/side effects for all the Medicines you take and that have been prescribed to you. Take any new Medicines after you have completely understood and accept all the possible adverse reactions/side effects.   Please note  You were cared for by a hospitalist during your hospital stay. If you have any questions about your discharge medications or the care you received while you were in the hospital after you are discharged, you can call the unit and  asked to speak with the hospitalist on call if the hospitalist that took care of you is not available. Once you are discharged, your primary care physician will handle any further medical issues. Please note that NO REFILLS for any discharge medications will be authorized once you are discharged, as it is imperative that you return to your primary care physician (or establish a relationship with a primary care physician if you do not have one) for your aftercare needs so that they can reassess your need for medications and monitor your lab values.    Today   CHIEF COMPLAINT:   Chief Complaint  Patient presents with  . Abdominal Pain  . Nausea    HISTORY OF PRESENT ILLNESS:  Meaghann Choo  is a 50 y.o. female with a known history of migraine headaches as well as gastroparesis presents to the emergency department complaining of intractable nausea and vomiting. She  also complains of headache. The patient was seen in the emergency department yesterday for elevated blood pressure. Today, the patient's blood pressure is again elevated greater than 200/100. After multiple doses of antiemetics as well as IV antihypertensives her pressure remained elevated which prompted the emergency department staff to call for admission. The patient denies chest pain or shortness of breath.  VITAL SIGNS:  Blood pressure 132/81, pulse 93, temperature 98.2 F (36.8 C), temperature source Oral, resp. rate 20, height 5\' 5"  (1.651 m), weight 129.5 kg (285 lb 8 oz), SpO2 98 %.  I/O:   Intake/Output Summary (Last 24 hours) at 03/26/16 1034 Last data filed at 03/26/16 0900  Gross per 24 hour  Intake             1360 ml  Output             1350 ml  Net               10 ml    PHYSICAL EXAMINATION:   GENERAL:  50 y.o.-year-old Morbidly obese patient lying in the bed with no acute distress.  EYES: Pupils equal, round, reactive to light and accommodation. No scleral icterus. Extraocular muscles intact.  HEENT: Head  atraumatic, normocephalic. Oropharynx and nasopharynx clear.  NECK:  Supple, no jugular venous distention. No thyroid enlargement, no tenderness.  LUNGS: Normal breath sounds bilaterally, no wheezing, rales,rhonchi or crepitation. No use of accessory muscles of respiration. Decreased bibasilar breath sounds CARDIOVASCULAR: S1, S2 normal. No murmurs, rubs, or gallops.  ABDOMEN: Abdomen is obese, tender in the right upper quadrant region and epigastric area, no guarding or rigidity. Soft on exam. Bowel sounds present. No organomegaly or mass.  EXTREMITIES: No pedal edema, cyanosis, or clubbing.  NEUROLOGIC: Cranial nerves II through XII are intact. Muscle strength 5/5 in all extremities. Sensation intact. Gait not checked.  PSYCHIATRIC: The patient is alert and oriented x 3.  SKIN: No obvious rash, lesion, or ulcer.    DATA REVIEW:   CBC  Recent Labs Lab 03/24/16 0429  WBC 9.6  HGB 13.1  HCT 40.0  PLT 393    Chemistries   Recent Labs Lab 03/22/16 2340 03/24/16 0429 03/25/16 0435  NA 135 136 137  K 3.6 3.2* 3.1*  CL 100* 100* 100*  CO2 26 28 30   GLUCOSE 162* 109* 99  BUN 7 11 13   CREATININE 0.86 0.86 0.88  CALCIUM 9.3 8.9 8.9  MG  --  1.9  --   AST 14*  --   --   ALT 13*  --   --   ALKPHOS 58  --   --   BILITOT 0.7  --   --     Cardiac Enzymes  Recent Labs Lab 03/22/16 2340  TROPONINI <0.03    Microbiology Results  Results for orders placed or performed in visit on 12/11/13  Culture, blood (single)     Status: None   Collection Time: 12/10/13  7:46 AM  Result Value Ref Range Status   Micro Text Report   Final       SOURCE: first set right hand    COMMENT                   NO GROWTH AEROBICALLY/ANAEROBICALLY IN 5 DAYS   ANTIBIOTIC  Culture, blood (single)     Status: None   Collection Time: 12/10/13  8:00 AM  Result Value Ref Range Status   Micro Text Report   Final       SOURCE: second set left  hand1    COMMENT                   NO GROWTH AEROBICALLY/ANAEROBICALLY IN 5 DAYS   ANTIBIOTIC                                                        RADIOLOGY:  Ct Abdomen Pelvis W Contrast  Result Date: 03/24/2016 CLINICAL DATA:  Nausea and vomiting for 2 days, status post cholecystectomy, hysterectomy, appendectomy, hernia repair, possible gross right is EXAM: CT ABDOMEN AND PELVIS WITH CONTRAST TECHNIQUE: Multidetector CT imaging of the abdomen and pelvis was performed using the standard protocol following bolus administration of intravenous contrast. CONTRAST:  125 cc Isovue COMPARISON:  CT scan 01/27/2016 FINDINGS: Lower chest: Lung bases are unremarkable. Small hiatal hernia is noted. There are artifacts from patient's large body habitus. Hepatobiliary: Enhanced liver is unremarkable. Status postcholecystectomy. Pancreas: Enhanced pancreas is unremarkable. Spleen: Enhanced spleen is unremarkable. Adrenals/Urinary Tract: No adrenal gland mass. Enhanced kidneys are symmetrical in size. No hydronephrosis or hydroureter. Delayed renal images shows bilateral renal symmetrical excretion. Bilateral visualized proximal ureter is unremarkable. The urinary bladder is under distended grossly unremarkable. Stomach/Bowel: Oral contrast material was given to the patient. There is no small bowel obstruction. Again noted chronic scarring and subcutaneous stranding in right lower anterior abdominal wall postsurgical in nature. No evidence of subcutaneous abscess. No pericecal inflammation. The terminal ileum is unremarkable. The patient is status post appendectomy. Some gas noted in distal sigmoid colon and rectum. No colitis or diverticulitis. No distal colonic obstruction Vascular/Lymphatic: No aortic aneurysm. No retroperitoneal or mesenteric adenopathy. Reproductive: The patient is status post hysterectomy. There is a left ovarian cyst measures 2.5 cm. Second left ovarian cyst measures 1.8 cm. Other: No  ascites or free abdominal air. There is small amount of subcutaneous air in left anterior pelvic wall axial image 75 probable subcutaneous injection no inguinal adenopathy. Musculoskeletal: Sagittal images of the spine shows no destructive bony lesions. Mild degenerative changes lower thoracic spine. IMPRESSION: 1. There is no evidence of acute inflammatory process within abdomen. 2. Stable postsurgical changes and scarring in right lower anterior abdominal wall. 3. No small bowel or colonic obstruction. Status post hysterectomy. Status post cholecystectomy. Status post appendectomy. 4. There is a left ovarian cyst measures 2.5 cm. Second left ovarian cyst measures 1.8 cm. Electronically Signed   By: Natasha Mead M.D.   On: 03/24/2016 16:56    EKG:   Orders placed or performed during the hospital encounter of 03/22/16  . EKG 12-Lead  . EKG 12-Lead  . EKG 12-Lead  . EKG 12-Lead  . EKG 12-Lead  . EKG 12-Lead      Management plans discussed with the patient, family and they are in agreement.  CODE STATUS:     Code Status Orders        Start     Ordered   03/23/16 0630  Full code  Continuous     03/23/16 0629    Code Status History    Date Active Date Inactive Code Status Order  ID Comments User Context   09/28/2015  4:54 AM 09/29/2015  5:20 PM Full Code [id number]  Ihor AustinPavan Pyreddy, MD ED   05/05/2015  8:36 PM 05/09/2015  8:06 PM Full Code [id number]  Wyatt Hasteavid K Hower, MD ED   01/07/2015  7:11 PM 01/09/2015  6:55 PM Full Code [id number]  Milagros LollSrikar Sudini, MD ED      TOTAL TIME TAKING CARE OF THIS PATIENT: 35 minutes.    Altamese DillingVACHHANI, Deardra Hinkley M.D on 03/26/2016 at 10:34 AM  Between 7am to 6pm - Pager - [phone]  After 6pm go to [email or web address] - Social research officer, governmentpassword [email or web address] [email or web address]  [facility]  Office  [phone]  CC: Primary care physician; FITZGERALD, DAVID Demetrius CharityP, MD   Note: This dictation was prepared with Dragon dictation along with smaller phrase technology. Any transcriptional  errors that result from this process are unintentional.

## 2016-03-26 NOTE — Progress Notes (Signed)
MD ordered patient to be discharged home.  Discharge instructions were reviewed with the patient and she voiced understanding.  Follow-up appointment was made.  Prescriptions given to the patient.  IV was removed with catheter intact.  All patients questions were answered.  Patient waiting for her ride to get here then she will be ready to leave.

## 2016-03-26 NOTE — Discharge Instructions (Signed)
° °  Abdominal Pain, Adult Many things can cause belly (abdominal) pain. Most times, the belly pain is not dangerous. Many cases of belly pain can be watched and treated at home. HOME CARE   Do not take medicines that help you go poop (laxatives) unless told to by your doctor.  Only take medicine as told by your doctor.  Eat or drink as told by your doctor. Your doctor will tell you if you should be on a special diet. GET HELP IF:  You do not know what is causing your belly pain.  You have belly pain while you are sick to your stomach (nauseous) or have runny poop (diarrhea).  You have pain while you pee or poop.  Your belly pain wakes you up at night.  You have belly pain that gets worse or better when you eat.  You have belly pain that gets worse when you eat fatty foods.  You have a fever. GET HELP RIGHT AWAY IF:   The pain does not go away within 2 hours.  You keep throwing up (vomiting).  The pain changes and is only in the right or left part of the belly.  You have bloody or tarry looking poop. MAKE SURE YOU:   Understand these instructions.  Will watch your condition.  Will get help right away if you are not doing well or get worse.   This information is not intended to replace advice given to you by your health care provider. Make sure you discuss any questions you have with your health care provider.   Document Released: 11/17/2007 Document Revised: 06/21/2014 Document Reviewed: 02/07/2013 Elsevier Interactive Patient Education 2016 [employer]  Soft diet for 1 week. Follow with GI in 2 weeks.

## 2016-04-21 ENCOUNTER — Ambulatory Visit: Payer: Medicare Other | Admitting: Gastroenterology

## 2016-05-31 DIAGNOSIS — M5416 Radiculopathy, lumbar region: Secondary | ICD-10-CM | POA: Diagnosis not present

## 2016-05-31 DIAGNOSIS — M47817 Spondylosis without myelopathy or radiculopathy, lumbosacral region: Secondary | ICD-10-CM | POA: Diagnosis not present

## 2016-05-31 DIAGNOSIS — M791 Myalgia: Secondary | ICD-10-CM | POA: Diagnosis not present

## 2016-05-31 DIAGNOSIS — M5481 Occipital neuralgia: Secondary | ICD-10-CM | POA: Diagnosis not present

## 2016-06-01 ENCOUNTER — Encounter: Payer: Self-pay | Admitting: Family Medicine

## 2016-06-01 ENCOUNTER — Ambulatory Visit (INDEPENDENT_AMBULATORY_CARE_PROVIDER_SITE_OTHER): Payer: Medicare Other | Admitting: Family Medicine

## 2016-06-01 VITALS — BP 152/92 | HR 100 | Temp 99.8°F | Resp 18 | Ht 63.75 in | Wt 298.0 lb

## 2016-06-01 DIAGNOSIS — J029 Acute pharyngitis, unspecified: Secondary | ICD-10-CM | POA: Diagnosis not present

## 2016-06-01 DIAGNOSIS — J0100 Acute maxillary sinusitis, unspecified: Secondary | ICD-10-CM

## 2016-06-01 LAB — POCT RAPID STREP A (OFFICE): Rapid Strep A Screen: NEGATIVE

## 2016-06-01 LAB — POCT INFLUENZA A/B
Influenza A, POC: NEGATIVE
Influenza B, POC: NEGATIVE

## 2016-06-01 MED ORDER — CEFDINIR 300 MG PO CAPS: 600.0000 mg | ORAL_CAPSULE | Freq: Every day | ORAL | 0 refills | Status: AC

## 2016-06-01 NOTE — Progress Notes (Signed)
Patient: Daisy [name] Female    DOB: 1966-04-16   50 y.o.   MRN: [id number] Visit Date: 06/01/2016  Today's Provider: Mila Merryonald Johanny Segers, MD   Chief Complaint  Patient presents with  . Sore Throat    x 1  day   Subjective:      Patient brought her mother into the office today for follow and reports that she is ill as below. She brings a copy of her insurance card from [employer] which has me designated as her PCP, although she has been followed by Dr. Sampson GoonFitzgerald. Agreed to see her for acute visit today.   Sore Throat   This is a new problem. The current episode started yesterday. The problem has been gradually worsening. The pain is worse on the right side. Maximum temperature: 101.9 this morning. Associated symptoms include coughing (productive with yellow phlegm), headaches, a hoarse voice, neck pain, shortness of breath, swollen glands and trouble swallowing. Pertinent negatives include no abdominal pain, congestion, diarrhea, drooling, ear discharge, ear pain, plugged ear sensation or vomiting.        Allergies  Allergen Reactions  . Acetaminophen-Codeine Hives  . Codeine Hives  . Ibuprofen Nausea And Vomiting  . Morphine Nausea And Vomiting  . Propoxyphene Other (See Comments)    Reaction:  GI upset   . Sulfa Antibiotics Hives  . Duloxetine Rash and Other (See Comments)    Yellowing of the skin     Current Outpatient Prescriptions:  .  alprazolam (XANAX) 2 MG tablet, Take 2 mg by mouth 3 (three) times daily as needed for anxiety. , Disp: , Rfl:  .  amLODipine (NORVASC) 5 MG tablet, Take 1 tablet (5 mg total) by mouth daily., Disp: 30 tablet, Rfl: 1 .  cloNIDine (CATAPRES - DOSED IN MG/24 HR) 0.1 mg/24hr patch, Place 1 patch (0.1 mg total) onto the skin once a week., Disp: 4 patch, Rfl: 1 .  FLUoxetine (PROZAC) 20 MG capsule, Take 20 mg by mouth daily., Disp: , Rfl:  .  furosemide (LASIX) 20 MG tablet, Take 20 mg by mouth daily., Disp: , Rfl:  .  lamoTRIgine  (LAMICTAL) 100 MG tablet, Take 100 mg by mouth daily., Disp: , Rfl:  .  lisinopril (PRINIVIL,ZESTRIL) 20 MG tablet, Take 20 mg by mouth daily., Disp: , Rfl:  .  metFORMIN (GLUCOPHAGE) 500 MG tablet, Take 500 mg by mouth daily., Disp: , Rfl:  .  metoprolol succinate (TOPROL-XL) 25 MG 24 hr tablet, Take 50 mg by mouth daily., Disp: , Rfl:  .  ondansetron (ZOFRAN) 4 MG tablet, Take 4 mg by mouth every 8 (eight) hours as needed for nausea., Disp: , Rfl:  .  pantoprazole (PROTONIX) 40 MG tablet, Take 1 tablet (40 mg total) by mouth 2 (two) times daily., Disp: 60 tablet, Rfl: 0 .  potassium chloride SA (K-DUR,KLOR-CON) 20 MEQ tablet, Take 20 mEq by mouth daily., Disp: , Rfl:  .  promethazine (PHENERGAN) 12.5 MG tablet, Take 1 tablet (12.5 mg total) by mouth every 6 (six) hours as needed for refractory nausea / vomiting., Disp: 30 tablet, Rfl: 0 .  sucralfate (CARAFATE) 1 g tablet, Take 1 tablet (1 g total) by mouth 4 (four) times daily., Disp: 100 tablet, Rfl: 1 .  tiZANidine (ZANAFLEX) 4 MG tablet, Take 4-12 mg by mouth 3 (three) times daily. 1-3 tablets per day as tolerated, Disp: , Rfl:  .  zolpidem (AMBIEN CR) 12.5 MG CR tablet, Take 12.5 mg by  mouth at bedtime as needed for sleep., Disp: , Rfl:   Review of Systems  Constitutional: Positive for chills, diaphoresis, fatigue and fever. Negative for appetite change.  HENT: Positive for hoarse voice, sinus pressure, sneezing, sore throat and trouble swallowing. Negative for congestion, drooling, ear discharge, ear pain, rhinorrhea and sinus pain.   Respiratory: Positive for cough (productive with yellow phlegm) and shortness of breath. Negative for chest tightness.   Cardiovascular: Negative for chest pain and palpitations.  Gastrointestinal: Negative for abdominal pain, diarrhea, nausea and vomiting.  Musculoskeletal: Positive for myalgias and neck pain.  Neurological: Positive for headaches. Negative for dizziness and weakness.    Social History    Substance Use Topics  . Smoking status: Never Smoker  . Smokeless tobacco: Never Used  . Alcohol use 0.0 oz/week     Comment: occasional   Objective:   BP (!) 152/92 (BP Location: Right Arm, Patient Position: Sitting, Cuff Size: Large)   Pulse 100   Temp 99.8 F (37.7 C) (Oral)   Resp 18   Ht 5' 3.75" (1.619 m)   Wt 298 lb (135.2 kg)   SpO2 95% Comment: room air  BMI 51.55 kg/m   Physical Exam  General Appearance:    Alert, cooperative, no distress, appears ill  HENT:   bilateral TM normal without fluid or infection, neck has right anterior cervical nodes enlarged, tonsils red, enlarged, with exudate present, right sinuses tender and nasal mucosa pale and congested  Eyes:    PERRL, conjunctiva/corneas clear, EOM's intact       Lungs:     Clear to auscultation bilaterally, respirations unlabored  Heart:    Regular rate and rhythm  Neurologic:   Awake, alert, oriented x 3. No apparent focal neurological           defect.           Assessment & Plan:      New patient to practice seen for acute problem below as she was already in the office to bring her mother in more office visit. [facility]   1. Sore throat  - POCT rapid strep A - POCT Influenza A/B  2. Acute maxillary sinusitis, recurrence not specified Cefdinir 300mg  two daily for 7 days.        Mila Merryonald Lavaeh Bau, MD  [facility]

## 2016-06-01 NOTE — Patient Instructions (Signed)
Sore Throat When you have a sore throat, your throat may:  Hurt.  Burn.  Feel irritated.  Feel scratchy. Many things can cause a sore throat, including:  An infection.  Allergies.  Dryness in the air.  Smoke or pollution.  Gastroesophageal reflux disease (GERD).  A tumor. A sore throat can be the first sign of another sickness. It can happen with other problems, like coughing or a fever. Most sore throats go away without treatment. Follow these instructions at home:  Take over-the-counter medicines only as told by your doctor.  Drink enough fluids to keep your pee (urine) clear or pale yellow.  Rest when you feel you need to.  To help with pain, try:  Sipping warm liquids, such as broth, herbal tea, or warm water.  Eating or drinking cold or frozen liquids, such as frozen ice pops.  Gargling with a salt-water mixture 3-4 times a day or as needed. To make a salt-water mixture, add -1 tsp of salt in 1 cup of warm water. Mix it until you cannot see the salt anymore.  Sucking on hard candy or throat lozenges.  Putting a cool-mist humidifier in your bedroom at night.  Sitting in the bathroom with the door closed for 5-10 minutes while you run hot water in the shower.  Do not use any tobacco products, such as cigarettes, chewing tobacco, and e-cigarettes. If you need help quitting, ask your doctor. Contact a doctor if:  You have a fever for more than 2-3 days.  You keep having symptoms for more than 2-3 days.  Your throat does not get better in 7 days.  You have a fever and your symptoms suddenly get worse. Get help right away if:  You have trouble breathing.  You cannot swallow fluids, soft foods, or your saliva.  You have swelling in your throat or neck that gets worse.  You keep feeling like you are going to throw up (vomit).  You keep throwing up. This information is not intended to replace advice given to you by your health care provider. Make sure  you discuss any questions you have with your health care provider. Document Released: 03/09/2008 Document Revised: 01/25/2016 Document Reviewed: 03/21/2015 Elsevier Interactive Patient Education  2017 Elsevier Inc.  

## 2016-06-18 ENCOUNTER — Ambulatory Visit (INDEPENDENT_AMBULATORY_CARE_PROVIDER_SITE_OTHER): Payer: Medicare Other | Admitting: Family Medicine

## 2016-06-18 ENCOUNTER — Other Ambulatory Visit: Payer: Self-pay | Admitting: Family Medicine

## 2016-06-18 ENCOUNTER — Encounter: Payer: Self-pay | Admitting: Family Medicine

## 2016-06-18 VITALS — BP 140/86 | HR 85 | Temp 99.1°F | Resp 16 | Wt 302.0 lb

## 2016-06-18 DIAGNOSIS — L03032 Cellulitis of left toe: Secondary | ICD-10-CM | POA: Diagnosis not present

## 2016-06-18 DIAGNOSIS — I10 Essential (primary) hypertension: Secondary | ICD-10-CM

## 2016-06-18 DIAGNOSIS — E119 Type 2 diabetes mellitus without complications: Secondary | ICD-10-CM | POA: Diagnosis not present

## 2016-06-18 DIAGNOSIS — G8929 Other chronic pain: Secondary | ICD-10-CM | POA: Diagnosis not present

## 2016-06-18 DIAGNOSIS — M25561 Pain in right knee: Secondary | ICD-10-CM

## 2016-06-18 DIAGNOSIS — L600 Ingrowing nail: Secondary | ICD-10-CM

## 2016-06-18 DIAGNOSIS — M545 Low back pain: Secondary | ICD-10-CM

## 2016-06-18 DIAGNOSIS — K219 Gastro-esophageal reflux disease without esophagitis: Secondary | ICD-10-CM | POA: Insufficient documentation

## 2016-06-18 DIAGNOSIS — F329 Major depressive disorder, single episode, unspecified: Secondary | ICD-10-CM | POA: Insufficient documentation

## 2016-06-18 DIAGNOSIS — F3181 Bipolar II disorder: Secondary | ICD-10-CM

## 2016-06-18 DIAGNOSIS — E785 Hyperlipidemia, unspecified: Secondary | ICD-10-CM | POA: Insufficient documentation

## 2016-06-18 DIAGNOSIS — G4700 Insomnia, unspecified: Secondary | ICD-10-CM

## 2016-06-18 DIAGNOSIS — N760 Acute vaginitis: Secondary | ICD-10-CM | POA: Diagnosis not present

## 2016-06-18 DIAGNOSIS — M25562 Pain in left knee: Secondary | ICD-10-CM

## 2016-06-18 DIAGNOSIS — F32A Depression, unspecified: Secondary | ICD-10-CM | POA: Insufficient documentation

## 2016-06-18 MED ORDER — AMLODIPINE BESYLATE 5 MG PO TABS: 5.0000 mg | ORAL_TABLET | Freq: Every day | ORAL | 3 refills | Status: DC

## 2016-06-18 MED ORDER — FLUCONAZOLE 150 MG PO TABS: 150.0000 mg | ORAL_TABLET | Freq: Once | ORAL | 0 refills | Status: AC

## 2016-06-18 MED ORDER — PANTOPRAZOLE SODIUM 40 MG PO TBEC: 40.0000 mg | DELAYED_RELEASE_TABLET | Freq: Two times a day (BID) | ORAL | 0 refills | Status: DC

## 2016-06-18 MED ORDER — POTASSIUM CHLORIDE CRYS ER 20 MEQ PO TBCR: 20.0000 meq | EXTENDED_RELEASE_TABLET | Freq: Every day | ORAL | 1 refills | Status: DC

## 2016-06-18 MED ORDER — RANITIDINE HCL 150 MG PO TABS: 150.0000 mg | ORAL_TABLET | Freq: Every day | ORAL | 4 refills | Status: DC

## 2016-06-18 MED ORDER — CEPHALEXIN 500 MG PO CAPS: 500.0000 mg | ORAL_CAPSULE | Freq: Four times a day (QID) | ORAL | 0 refills | Status: AC

## 2016-06-18 MED ORDER — METOPROLOL SUCCINATE ER 25 MG PO TB24: 50.0000 mg | ORAL_TABLET | Freq: Every day | ORAL | 3 refills | Status: DC

## 2016-06-18 MED ORDER — FUROSEMIDE 20 MG PO TABS: 20.0000 mg | ORAL_TABLET | Freq: Every day | ORAL | 1 refills | Status: DC | PRN

## 2016-06-18 MED ORDER — METRONIDAZOLE 500 MG PO TABS: 500.0000 mg | ORAL_TABLET | Freq: Three times a day (TID) | ORAL | 0 refills | Status: DC

## 2016-06-18 MED ORDER — METFORMIN HCL 500 MG PO TABS: 500.0000 mg | ORAL_TABLET | Freq: Every day | ORAL | 4 refills | Status: DC

## 2016-06-18 MED ORDER — LISINOPRIL 20 MG PO TABS: 20.0000 mg | ORAL_TABLET | Freq: Every day | ORAL | 1 refills | Status: DC
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
## 2016-06-24 DIAGNOSIS — Z79899 Other long term (current) drug therapy: Secondary | ICD-10-CM | POA: Diagnosis not present

## 2016-06-28 ENCOUNTER — Other Ambulatory Visit: Payer: Self-pay | Admitting: Family Medicine

## 2016-06-28 MED ORDER — CLONIDINE HCL 0.1 MG/24HR TD PTWK: 0.1000 mg | MEDICATED_PATCH | TRANSDERMAL | 5 refills | Status: DC

## 2016-06-28 MED ORDER — PROMETHAZINE HCL 12.5 MG PO TABS: 12.5000 mg | ORAL_TABLET | Freq: Four times a day (QID) | ORAL | 3 refills | Status: DC | PRN

## 2016-06-28 NOTE — Telephone Encounter (Signed)
[facility]  promethazine (PHENERGAN) 12.5 MG tablet  cloNIDine (CATAPRES - DOSED IN MG/24 HR) 0.1 mg/24hr patch  Thanks Barth Kirkseri

## 2016-06-29 DIAGNOSIS — M5416 Radiculopathy, lumbar region: Secondary | ICD-10-CM | POA: Diagnosis not present

## 2016-06-29 DIAGNOSIS — M791 Myalgia: Secondary | ICD-10-CM | POA: Diagnosis not present

## 2016-06-29 DIAGNOSIS — M5481 Occipital neuralgia: Secondary | ICD-10-CM | POA: Diagnosis not present

## 2016-06-29 DIAGNOSIS — M47817 Spondylosis without myelopathy or radiculopathy, lumbosacral region: Secondary | ICD-10-CM | POA: Diagnosis not present

## 2016-07-27 ENCOUNTER — Ambulatory Visit (INDEPENDENT_AMBULATORY_CARE_PROVIDER_SITE_OTHER): Payer: Medicare Other | Admitting: Family Medicine

## 2016-07-27 ENCOUNTER — Encounter: Payer: Self-pay | Admitting: Family Medicine

## 2016-07-27 VITALS — BP 122/92 | HR 74 | Temp 97.6°F | Resp 18 | Wt 304.0 lb

## 2016-07-27 DIAGNOSIS — J209 Acute bronchitis, unspecified: Secondary | ICD-10-CM

## 2016-07-27 DIAGNOSIS — M47817 Spondylosis without myelopathy or radiculopathy, lumbosacral region: Secondary | ICD-10-CM | POA: Diagnosis not present

## 2016-07-27 DIAGNOSIS — M791 Myalgia: Secondary | ICD-10-CM | POA: Diagnosis not present

## 2016-07-27 DIAGNOSIS — M5416 Radiculopathy, lumbar region: Secondary | ICD-10-CM | POA: Diagnosis not present

## 2016-07-27 DIAGNOSIS — M5481 Occipital neuralgia: Secondary | ICD-10-CM | POA: Diagnosis not present

## 2016-07-27 MED ORDER — DOXYCYCLINE HYCLATE 100 MG PO TABS: 100.0000 mg | ORAL_TABLET | Freq: Two times a day (BID) | ORAL | 0 refills | Status: DC

## 2016-07-27 MED ORDER — ALBUTEROL SULFATE HFA 108 (90 BASE) MCG/ACT IN AERS: 2.0000 | INHALATION_SPRAY | Freq: Four times a day (QID) | RESPIRATORY_TRACT | 2 refills | Status: DC | PRN

## 2016-07-27 NOTE — Progress Notes (Signed)
Patient: Daisy GoslingDanita A [name] Female    DOB: [birth year]   51 y.o.   MRN: [id number] Visit Date: 07/27/2016  Today's Provider: Mila Merryonald Devonia Farro, MD   Chief Complaint  Patient presents with  . Cough    x 4- 5 days   Subjective:    Cough  This is a new problem. Episode onset: 4-5 days ago. The problem has been gradually worsening. The cough is productive of sputum (yellow colored sputum). Associated symptoms include chest pain (when coughing), chills (last night), headaches, myalgias, postnasal drip, rhinorrhea, a sore throat (started 2 nights ago), shortness of breath, sweats and wheezing. Pertinent negatives include no ear congestion, ear pain, fever, hemoptysis or nasal congestion. She has tried steroid inhaler (cough drops) for the symptoms. The treatment provided mild relief.    She also states she has been trying to lose weight with diet and exercise for years without success. She is requesting evaluation for bariatric sleeve surgery.     Allergies  Allergen Reactions  . Acetaminophen-Codeine Hives  . Codeine Hives  . Ibuprofen Nausea And Vomiting  . Morphine Nausea And Vomiting  . Propoxyphene Other (See Comments)    Reaction:  GI upset   . Sulfa Antibiotics Hives  . Duloxetine Rash and Other (See Comments)    Yellowing of the skin     Current Outpatient Prescriptions:  .  alprazolam (XANAX) 2 MG tablet, Take 2 mg by mouth 3 (three) times daily as needed for anxiety. , Disp: , Rfl:  .  amLODipine (NORVASC) 5 MG tablet, Take 1 tablet (5 mg total) by mouth daily., Disp: 90 tablet, Rfl: 3 .  cloNIDine (CATAPRES - DOSED IN MG/24 HR) 0.1 mg/24hr patch, Place 1 patch (0.1 mg total) onto the skin once a week., Disp: 4 patch, Rfl: 5 .  FLUoxetine (PROZAC) 20 MG capsule, Take 20 mg by mouth daily., Disp: , Rfl:  .  furosemide (LASIX) 20 MG tablet, Take 1 tablet (20 mg total) by mouth daily as needed for edema., Disp: 90 tablet, Rfl: 1 .  lamoTRIgine (LAMICTAL) 100 MG tablet, Take  100 mg by mouth daily., Disp: , Rfl:  .  lisinopril (PRINIVIL,ZESTRIL) 20 MG tablet, Take 1 tablet (20 mg total) by mouth daily., Disp: 90 tablet, Rfl: 1 .  metFORMIN (GLUCOPHAGE) 500 MG tablet, Take 1 tablet (500 mg total) by mouth daily., Disp: 90 tablet, Rfl: 4 .  metoprolol succinate (TOPROL-XL) 25 MG 24 hr tablet, Take 2 tablets (50 mg total) by mouth daily., Disp: 180 tablet, Rfl: 3 .  metroNIDAZOLE (FLAGYL) 500 MG tablet, Take 1 tablet (500 mg total) by mouth 3 (three) times daily., Disp: 21 tablet, Rfl: 0 .  ondansetron (ZOFRAN) 4 MG tablet, Take 4 mg by mouth every 8 (eight) hours as needed for nausea., Disp: , Rfl:  .  Oxycodone HCl 10 MG TABS, Take 4-7 tablets daily, Disp: , Rfl:  .  pantoprazole (PROTONIX) 40 MG tablet, Take 1 tablet (40 mg total) by mouth 2 (two) times daily., Disp: 180 tablet, Rfl: 0 .  potassium chloride SA (K-DUR,KLOR-CON) 20 MEQ tablet, Take 1 tablet (20 mEq total) by mouth daily. Take with furosemide, Disp: 90 tablet, Rfl: 1 .  promethazine (PHENERGAN) 12.5 MG tablet, Take 1 tablet (12.5 mg total) by mouth every 6 (six) hours as needed for refractory nausea / vomiting., Disp: 30 tablet, Rfl: 3 .  ranitidine (ZANTAC) 150 MG tablet, Take 1 tablet (150 mg total) by mouth at bedtime.,  Disp: 90 tablet, Rfl: 4 .  sucralfate (CARAFATE) 1 g tablet, Take 1 tablet (1 g total) by mouth 4 (four) times daily., Disp: 100 tablet, Rfl: 1 .  tiZANidine (ZANAFLEX) 4 MG tablet, Take 4-12 mg by mouth 3 (three) times daily. 1-3 tablets per day as tolerated, Disp: , Rfl:  .  zolpidem (AMBIEN CR) 12.5 MG CR tablet, Take 12.5 mg by mouth at bedtime as needed for sleep., Disp: , Rfl:   Review of Systems  Constitutional: Positive for chills (last night), diaphoresis and fatigue. Negative for appetite change and fever.  HENT: Positive for congestion, postnasal drip, rhinorrhea and sore throat (started 2 nights ago). Negative for ear pain.   Respiratory: Positive for cough, shortness of  breath and wheezing. Negative for hemoptysis and chest tightness.   Cardiovascular: Positive for chest pain (when coughing). Negative for palpitations.  Gastrointestinal: Negative for abdominal pain, nausea and vomiting.  Musculoskeletal: Positive for myalgias.  Neurological: Positive for headaches. Negative for dizziness and weakness.    Social History  Substance Use Topics  . Smoking status: Never Smoker  . Smokeless tobacco: Never Used  . Alcohol use 0.0 oz/week     Comment: occasional   Objective:   BP (!) 122/92 (BP Location: Left Arm, Patient Position: Sitting, Cuff Size: Large)   Pulse 74   Temp 97.6 F (36.4 C) (Oral)   Resp 18   Wt (!) 304 lb (137.9 kg)   SpO2 99% Comment: room air  BMI 52.59 kg/m   Physical Exam    General Appearance:    Alert, cooperative, no distress  HENT:   bilateral TM normal without fluid or infection, bilateral TM fluid noted, throat normal without erythema or exudate, sinuses nontender and nasal mucosa pale and congested  Eyes:    PERRL, conjunctiva/corneas clear, EOM's intact       Lungs:    Occasional expiratory wheeze, no rales, , respirations unlabored  Heart:    Regular rate and rhythm  Neurologic:   Awake, alert, oriented x 3. No apparent focal neurological           defect.           Assessment & Plan:     .1. Acute bronchitis, unspecified organism  - doxycycline (VIBRA-TABS) 100 MG tablet; Take 1 tablet (100 mg total) by mouth 2 (two) times daily.  Dispense: 20 tablet; Refill: 0 - albuterol (PROVENTIL HFA;VENTOLIN HFA) 108 (90 Base) MCG/ACT inhaler; Inhale 2 puffs into the lungs every 6 (six) hours as needed for wheezing or shortness of breath.  Dispense: 1 Inhaler; Refill: 2 Call if symptoms change or if not rapidly improving.      2. Morbid obesity [facility]) Refer bariatric surgery       Mila Merry, MD  [facility]

## 2016-07-27 NOTE — Patient Instructions (Signed)

## 2016-08-20 ENCOUNTER — Other Ambulatory Visit: Payer: Self-pay | Admitting: Family Medicine

## 2016-08-20 ENCOUNTER — Other Ambulatory Visit: Payer: Self-pay

## 2016-08-20 DIAGNOSIS — I10 Essential (primary) hypertension: Secondary | ICD-10-CM

## 2016-08-20 MED ORDER — LISINOPRIL 20 MG PO TABS: 20.0000 mg | ORAL_TABLET | Freq: Every day | ORAL | 0 refills | Status: DC

## 2016-08-20 MED ORDER — PANTOPRAZOLE SODIUM 40 MG PO TBEC: 40.0000 mg | DELAYED_RELEASE_TABLET | Freq: Two times a day (BID) | ORAL | 0 refills | Status: DC

## 2016-08-20 NOTE — Telephone Encounter (Signed)
Patient called asking for refills on Protonix. Also she states she is about to call in refill on Lisinopril and she will not have anymore refills after that in case we can go ahead and refill that too for next time. Please review-aa

## 2016-08-20 NOTE — Telephone Encounter (Signed)
Please advise patient that she is overdue for labs that were order in January. Have sent in 30 day prescription refills, but she needs to get labs done.

## 2016-08-23 NOTE — Telephone Encounter (Signed)
Unable to reach pt. Will try again later.  

## 2016-08-24 DIAGNOSIS — M5416 Radiculopathy, lumbar region: Secondary | ICD-10-CM | POA: Diagnosis not present

## 2016-08-24 DIAGNOSIS — Z79891 Long term (current) use of opiate analgesic: Secondary | ICD-10-CM | POA: Diagnosis not present

## 2016-08-24 DIAGNOSIS — M47817 Spondylosis without myelopathy or radiculopathy, lumbosacral region: Secondary | ICD-10-CM | POA: Diagnosis not present

## 2016-08-24 DIAGNOSIS — G894 Chronic pain syndrome: Secondary | ICD-10-CM | POA: Diagnosis not present

## 2016-08-24 DIAGNOSIS — M791 Myalgia: Secondary | ICD-10-CM | POA: Diagnosis not present

## 2016-08-24 DIAGNOSIS — M5481 Occipital neuralgia: Secondary | ICD-10-CM | POA: Diagnosis not present

## 2016-08-25 DIAGNOSIS — M1712 Unilateral primary osteoarthritis, left knee: Secondary | ICD-10-CM | POA: Diagnosis not present

## 2016-08-25 MED ORDER — LISINOPRIL 20 MG PO TABS: 20.0000 mg | ORAL_TABLET | Freq: Every day | ORAL | 0 refills | Status: DC

## 2016-08-25 MED ORDER — PANTOPRAZOLE SODIUM 40 MG PO TBEC: 40.0000 mg | DELAYED_RELEASE_TABLET | Freq: Two times a day (BID) | ORAL | 14 refills | Status: DC

## 2016-08-25 NOTE — Telephone Encounter (Signed)
Ok for office visit in May, but she is overdue for labs now. Can reprint lab order from January if she doesn't have original. Have sent in 2 weeks refill to get by until she can get to lab.

## 2016-08-25 NOTE — Telephone Encounter (Signed)
Patient advised. Patient states she has a follow up appointment scheduled for 10/15/2016

## 2016-08-26 NOTE — Telephone Encounter (Signed)
Patient advised. Lab slipped reprinted at the front desk for pick up.

## 2016-09-14 ENCOUNTER — Ambulatory Visit (INDEPENDENT_AMBULATORY_CARE_PROVIDER_SITE_OTHER): Payer: Medicare Other | Admitting: Family Medicine

## 2016-09-14 ENCOUNTER — Ambulatory Visit
Admission: RE | Admit: 2016-09-14 | Discharge: 2016-09-14 | Disposition: A | Discharge status: Home / Self Care | Payer: Medicare Other | Source: Ambulatory Visit | Attending: Orthopedic Surgery | Admitting: Orthopedic Surgery

## 2016-09-14 ENCOUNTER — Other Ambulatory Visit: Payer: Self-pay | Admitting: Orthopedic Surgery

## 2016-09-14 ENCOUNTER — Encounter: Payer: Self-pay | Admitting: Family Medicine

## 2016-09-14 VITALS — BP 118/78 | HR 80 | Temp 98.0°F | Resp 16 | Wt 298.2 lb

## 2016-09-14 DIAGNOSIS — E785 Hyperlipidemia, unspecified: Secondary | ICD-10-CM | POA: Diagnosis not present

## 2016-09-14 DIAGNOSIS — M1711 Unilateral primary osteoarthritis, right knee: Secondary | ICD-10-CM | POA: Diagnosis not present

## 2016-09-14 DIAGNOSIS — N3090 Cystitis, unspecified without hematuria: Secondary | ICD-10-CM | POA: Diagnosis not present

## 2016-09-14 DIAGNOSIS — I10 Essential (primary) hypertension: Secondary | ICD-10-CM

## 2016-09-14 DIAGNOSIS — M25561 Pain in right knee: Secondary | ICD-10-CM

## 2016-09-14 DIAGNOSIS — E119 Type 2 diabetes mellitus without complications: Secondary | ICD-10-CM | POA: Diagnosis not present

## 2016-09-14 LAB — POCT URINALYSIS DIPSTICK
Glucose, UA: NEGATIVE
KETONES UA: NEGATIVE
NITRITE UA: NEGATIVE
PH UA: 6 (ref 5.0–8.0)
Spec Grav, UA: 1.02 (ref 1.030–1.035)
Urobilinogen, UA: 1 (ref ?–2.0)

## 2016-09-14 MED ORDER — DOXYCYCLINE HYCLATE 100 MG PO TABS: 100.0000 mg | ORAL_TABLET | Freq: Two times a day (BID) | ORAL | 0 refills | Status: DC

## 2016-09-14 MED ORDER — LISINOPRIL 20 MG PO TABS: 30.0000 mg | ORAL_TABLET | Freq: Every day | ORAL | 0 refills | Status: DC

## 2016-09-14 NOTE — Progress Notes (Signed)
Subjective:     Patient ID: Daisy [name], female   DOB: 05/01/1966, 51 y.o.   MRN: [id number]  HPI  Chief Complaint  Patient presents with  . Dysuria    Patient comes in office today with complaints of lower back pain and nausea for the past 7 days.   States she feels like she did when she had a UTI. Also wishes refill on lisinopril pending her next appointment.   Review of Systems     Objective:   Physical Exam  Constitutional: She appears well-developed and well-nourished. No distress.  Genitourinary:  Genitourinary Comments: Mild right CVA tenderness on percussion       Assessment:    1. Cystitis - Urine culture - POCT urinalysis dipstick - doxycycline (VIBRA-TABS) 100 MG tablet; Take 1 tablet (100 mg total) by mouth 2 (two) times daily.  Dispense: 14 tablet; Refill: 0  2. Essential hypertension - lisinopril (PRINIVIL,ZESTRIL) 20 MG tablet; Take 1.5 tablets (30 mg total) by mouth daily.  Dispense: 14 tablet; Refill: 0    Plan:    Further f/u pending urine culture.

## 2016-09-14 NOTE — Patient Instructions (Signed)
We will call you with the urine culture result. 

## 2016-09-15 LAB — HEMOGLOBIN A1C
ESTIMATED AVERAGE GLUCOSE: 123 mg/dL
HEMOGLOBIN A1C: 5.9 % — AB (ref 4.8–5.6)

## 2016-09-15 LAB — COMPREHENSIVE METABOLIC PANEL
A/G RATIO: 1.6 (ref 1.2–2.2)
ALBUMIN: 4.1 g/dL (ref 3.5–5.5)
ALT: 11 IU/L (ref 0–32)
AST: 7 IU/L (ref 0–40)
Alkaline Phosphatase: 60 IU/L (ref 39–117)
BUN/Creatinine Ratio: 12 (ref 9–23)
BUN: 8 mg/dL (ref 6–24)
Bilirubin Total: 0.6 mg/dL (ref 0.0–1.2)
CALCIUM: 8.7 mg/dL (ref 8.7–10.2)
CO2: 25 mmol/L (ref 18–29)
Chloride: 97 mmol/L (ref 96–106)
Creatinine, Ser: 0.66 mg/dL (ref 0.57–1.00)
GFR calc Af Amer: 121 mL/min/{1.73_m2} (ref >59)
GFR, EST NON AFRICAN AMERICAN: 105 mL/min/{1.73_m2} (ref >59)
GLOBULIN, TOTAL: 2.6 g/dL (ref 1.5–4.5)
Glucose: 90 mg/dL (ref 65–99)
POTASSIUM: 3.7 mmol/L (ref 3.5–5.2)
Sodium: 140 mmol/L (ref 134–144)
Total Protein: 6.7 g/dL (ref 6.0–8.5)

## 2016-09-15 LAB — LIPID PANEL
CHOL/HDL RATIO: 3.2 ratio (ref 0.0–4.4)
Cholesterol, Total: 188 mg/dL (ref 100–199)
HDL: 59 mg/dL (ref >39)
LDL CALC: 108 mg/dL — AB (ref 0–99)
TRIGLYCERIDES: 106 mg/dL (ref 0–149)
VLDL Cholesterol Cal: 21 mg/dL (ref 5–40)

## 2016-09-18 LAB — URINE CULTURE

## 2016-09-20 ENCOUNTER — Other Ambulatory Visit: Payer: Self-pay | Admitting: Family Medicine

## 2016-09-20 DIAGNOSIS — N3090 Cystitis, unspecified without hematuria: Secondary | ICD-10-CM

## 2016-09-20 MED ORDER — CEPHALEXIN 500 MG PO CAPS: 500.0000 mg | ORAL_CAPSULE | Freq: Two times a day (BID) | ORAL | 0 refills | Status: DC

## 2016-09-21 DIAGNOSIS — M47817 Spondylosis without myelopathy or radiculopathy, lumbosacral region: Secondary | ICD-10-CM | POA: Diagnosis not present

## 2016-09-21 DIAGNOSIS — M5481 Occipital neuralgia: Secondary | ICD-10-CM | POA: Diagnosis not present

## 2016-09-21 DIAGNOSIS — M5417 Radiculopathy, lumbosacral region: Secondary | ICD-10-CM | POA: Diagnosis not present

## 2016-09-21 DIAGNOSIS — M545 Low back pain: Secondary | ICD-10-CM | POA: Diagnosis not present

## 2016-09-21 DIAGNOSIS — M5416 Radiculopathy, lumbar region: Secondary | ICD-10-CM | POA: Diagnosis not present

## 2016-09-21 DIAGNOSIS — M791 Myalgia: Secondary | ICD-10-CM | POA: Diagnosis not present

## 2016-09-21 DIAGNOSIS — Z79891 Long term (current) use of opiate analgesic: Secondary | ICD-10-CM | POA: Diagnosis not present

## 2016-09-21 DIAGNOSIS — M5137 Other intervertebral disc degeneration, lumbosacral region: Secondary | ICD-10-CM | POA: Diagnosis not present

## 2016-09-21 DIAGNOSIS — M5136 Other intervertebral disc degeneration, lumbar region: Secondary | ICD-10-CM | POA: Diagnosis not present

## 2016-09-21 NOTE — Progress Notes (Signed)
Advised  ED 

## 2016-09-22 ENCOUNTER — Other Ambulatory Visit: Payer: Self-pay | Admitting: Family Medicine

## 2016-09-22 DIAGNOSIS — I10 Essential (primary) hypertension: Secondary | ICD-10-CM

## 2016-09-22 MED ORDER — LISINOPRIL 20 MG PO TABS: 30.0000 mg | ORAL_TABLET | Freq: Every day | ORAL | 5 refills | Status: DC

## 2016-09-22 MED ORDER — PANTOPRAZOLE SODIUM 40 MG PO TBEC: 40.0000 mg | DELAYED_RELEASE_TABLET | Freq: Two times a day (BID) | ORAL | 5 refills | Status: DC

## 2016-09-22 NOTE — Telephone Encounter (Signed)
Pt contacted office for refill request on the following medications:  [facility]  [facility]#[phone]/[facility]  lisinopril (PRINIVIL,ZESTRIL) 20 MG tablet  pantoprazole (PROTONIX) 40 MG tablet

## 2016-10-14 ENCOUNTER — Ambulatory Visit: Payer: Medicare Other | Admitting: Family Medicine

## 2016-10-14 ENCOUNTER — Ambulatory Visit: Payer: Medicare Other

## 2016-10-15 ENCOUNTER — Ambulatory Visit: Payer: Medicare Other | Admitting: Family Medicine

## 2016-10-18 DIAGNOSIS — M47817 Spondylosis without myelopathy or radiculopathy, lumbosacral region: Secondary | ICD-10-CM | POA: Diagnosis not present

## 2016-10-18 DIAGNOSIS — M5481 Occipital neuralgia: Secondary | ICD-10-CM | POA: Diagnosis not present

## 2016-10-18 DIAGNOSIS — M5416 Radiculopathy, lumbar region: Secondary | ICD-10-CM | POA: Diagnosis not present

## 2016-10-18 DIAGNOSIS — M791 Myalgia: Secondary | ICD-10-CM | POA: Diagnosis not present

## 2016-11-09 ENCOUNTER — Other Ambulatory Visit: Payer: Self-pay | Admitting: Family Medicine

## 2016-11-09 DIAGNOSIS — I10 Essential (primary) hypertension: Secondary | ICD-10-CM

## 2016-11-09 NOTE — Telephone Encounter (Signed)
Pharmacy faxed for refill request on the following medications: pantoprazole (PROTONIX) 40 MG tablet 90 day supply Last Rx was sent in April with refills but it was for a 30 day supply. [facility] 90 day supply[facility] Please advise. Thanks TNP

## 2016-11-09 NOTE — Telephone Encounter (Signed)
Requesting 90 day supply.

## 2016-11-09 NOTE — Telephone Encounter (Signed)
[facility] day [facility] Rx for lisinopril (PRINIVIL,ZESTRIL) 20 MG tablet. Please advise. Thanks TNP

## 2016-11-10 ENCOUNTER — Other Ambulatory Visit: Payer: Self-pay | Admitting: Family Medicine

## 2016-11-10 MED ORDER — LISINOPRIL 20 MG PO TABS: 30.0000 mg | ORAL_TABLET | Freq: Every day | ORAL | 2 refills | Status: DC

## 2016-11-10 MED ORDER — PANTOPRAZOLE SODIUM 40 MG PO TBEC: 40.0000 mg | DELAYED_RELEASE_TABLET | Freq: Two times a day (BID) | ORAL | 4 refills | Status: DC

## 2016-11-10 NOTE — Telephone Encounter (Signed)
Requesting 90 day supply.

## 2016-11-10 NOTE — Telephone Encounter (Signed)
[facility] on the following medications:  pantoprazole (PROTONIX) 40 MG tablet.  Take 1 tablet by mouth 2 time daily.  90 day supply.  [facility] [address]

## 2016-11-12 ENCOUNTER — Ambulatory Visit (INDEPENDENT_AMBULATORY_CARE_PROVIDER_SITE_OTHER): Payer: Medicare Other | Admitting: Family Medicine

## 2016-11-12 ENCOUNTER — Encounter: Payer: Self-pay | Admitting: Family Medicine

## 2016-11-12 ENCOUNTER — Ambulatory Visit (INDEPENDENT_AMBULATORY_CARE_PROVIDER_SITE_OTHER): Payer: Medicare Other

## 2016-11-12 VITALS — BP 124/68 | HR 80 | Temp 99.3°F | Ht 64.0 in | Wt 289.4 lb

## 2016-11-12 VITALS — Resp 16

## 2016-11-12 DIAGNOSIS — R300 Dysuria: Secondary | ICD-10-CM

## 2016-11-12 DIAGNOSIS — R1031 Right lower quadrant pain: Secondary | ICD-10-CM

## 2016-11-12 DIAGNOSIS — Z0000 Encounter for general adult medical examination without abnormal findings: Secondary | ICD-10-CM | POA: Diagnosis not present

## 2016-11-12 LAB — POCT URINALYSIS DIPSTICK
Bilirubin, UA: NEGATIVE
Blood, UA: NEGATIVE
Glucose, UA: NEGATIVE
KETONES UA: NEGATIVE
Nitrite, UA: NEGATIVE
PH UA: 6.5 (ref 5.0–8.0)
PROTEIN UA: NEGATIVE
SPEC GRAV UA: 1.015 (ref 1.010–1.025)
Urobilinogen, UA: 0.2 E.U./dL

## 2016-11-12 MED ORDER — CIPROFLOXACIN HCL 500 MG PO TABS: 500.0000 mg | ORAL_TABLET | Freq: Two times a day (BID) | ORAL | 0 refills | Status: AC

## 2016-11-12 MED ORDER — FLUCONAZOLE 150 MG PO TABS: 150.0000 mg | ORAL_TABLET | Freq: Once | ORAL | 0 refills | Status: AC

## 2016-11-12 NOTE — Patient Instructions (Signed)
Daisy [name] , Thank you for taking time to come for your Medicare Wellness Visit. I appreciate your ongoing commitment to your health goals. Please review the following plan we discussed and let me know if I can assist you in the future.   Screening recommendations/referrals: Colonoscopy: N/A Mammogram: N/A Bone Density: N/A Recommended yearly ophthalmology/optometry visit for glaucoma screening and checkup Recommended yearly dental visit for hygiene and checkup  Vaccinations: Influenza vaccine: due 02/2017 Pneumococcal vaccine: N/A until age 99 Tdap vaccine: declined today Shingles vaccine: N/A  Advanced directives: Advance directive discussed with you today. I have provided a copy for you to complete at home and have notarized. Once this is complete please bring a copy in to our office so we can scan it into your chart.  Conditions/risks identified: Fall risk prevention; Obesity, recommend increasing water intake to 4 glasses a day.  Next appointment: None, need to schedule follow up with PCP and 1 year AWV.  Preventive Care 40-64 Years, Female Preventive care refers to lifestyle choices and visits with your health care provider that can promote health and wellness. What does preventive care include?  A yearly physical exam. This is also called an annual well check.  Dental exams once or twice a year.  Routine eye exams. Ask your health care provider how often you should have your eyes checked.  Personal lifestyle choices, including:  Daily care of your teeth and gums.  Regular physical activity.  Eating a healthy diet.  Avoiding tobacco and drug use.  Limiting alcohol use.  Practicing safe sex.  Taking low-dose aspirin daily starting at age 29.  Taking vitamin and mineral supplements as recommended by your health care provider. What happens during an annual well check? The services and screenings done by your health care provider during your annual well check will  depend on your age, overall health, lifestyle risk factors, and family history of disease. Counseling  Your health care provider may ask you questions about your:  Alcohol use.  Tobacco use.  Drug use.  Emotional well-being.  Home and relationship well-being.  Sexual activity.  Eating habits.  Work and work Statistician.  Method of birth control.  Menstrual cycle.  Pregnancy history. Screening  You may have the following tests or measurements:  Height, weight, and BMI.  Blood pressure.  Lipid and cholesterol levels. These may be checked every 5 years, or more frequently if you are over 36 years old.  Skin check.  Lung cancer screening. You may have this screening every year starting at age 19 if you have a 30-pack-year history of smoking and currently smoke or have quit within the past 15 years.  Fecal occult blood test (FOBT) of the stool. You may have this test every year starting at age 2.  Flexible sigmoidoscopy or colonoscopy. You may have a sigmoidoscopy every 5 years or a colonoscopy every 10 years starting at age 79.  Hepatitis C blood test.  Hepatitis B blood test.  Sexually transmitted disease (STD) testing.  Diabetes screening. This is done by checking your blood sugar (glucose) after you have not eaten for a while (fasting). You may have this done every 1-3 years.  Mammogram. This may be done every 1-2 years. Talk to your health care provider about when you should start having regular mammograms. This may depend on whether you have a family history of breast cancer.  BRCA-related cancer screening. This may be done if you have a family history of breast, ovarian, tubal, or peritoneal  cancers.  Pelvic exam and Pap test. This may be done every 3 years starting at age 71. Starting at age 82, this may be done every 5 years if you have a Pap test in combination with an HPV test.  Bone density scan. This is done to screen for osteoporosis. You may have  this scan if you are at high risk for osteoporosis. Discuss your test results, treatment options, and if necessary, the need for more tests with your health care provider. Vaccines  Your health care provider may recommend certain vaccines, such as:  Influenza vaccine. This is recommended every year.  Tetanus, diphtheria, and acellular pertussis (Tdap, Td) vaccine. You may need a Td booster every 10 years.  Zoster vaccine. You may need this after age 32.  Pneumococcal 13-valent conjugate (PCV13) vaccine. You may need this if you have certain conditions and were not previously vaccinated.  Pneumococcal polysaccharide (PPSV23) vaccine. You may need one or two doses if you smoke cigarettes or if you have certain conditions. Talk to your health care provider about which screenings and vaccines you need and how often you need them. This information is not intended to replace advice given to you by your health care provider. Make sure you discuss any questions you have with your health care provider. Document Released: 06/27/2015 Document Revised: 02/18/2016 Document Reviewed: 04/01/2015 Elsevier Interactive Patient Education  2017 [place] Prevention in the Home Falls can cause injuries. They can happen to people of all ages. There are many things you can do to make your home safe and to help prevent falls. What can I do on the outside of my home?  Regularly fix the edges of walkways and driveways and fix any cracks.  Remove anything that might make you trip as you walk through a door, such as a raised step or threshold.  Trim any bushes or trees on the path to your home.  Use bright outdoor lighting.  Clear any walking paths of anything that might make someone trip, such as rocks or tools.  Regularly check to see if handrails are loose or broken. Make sure that both sides of any steps have handrails.  Any raised decks and porches should have guardrails on the  edges.  Have any leaves, snow, or ice cleared regularly.  Use sand or salt on walking paths during winter.  Clean up any spills in your garage right away. This includes oil or grease spills. What can I do in the bathroom?  Use night lights.  Install grab bars by the toilet and in the tub and shower. Do not use towel bars as grab bars.  Use non-skid mats or decals in the tub or shower.  If you need to sit down in the shower, use a plastic, non-slip stool.  Keep the floor dry. Clean up any water that spills on the floor as soon as it happens.  Remove soap buildup in the tub or shower regularly.  Attach bath mats securely with double-sided non-slip rug tape.  Do not have throw rugs and other things on the floor that can make you trip. What can I do in the bedroom?  Use night lights.  Make sure that you have a light by your bed that is easy to reach.  Do not use any sheets or blankets that are too big for your bed. They should not hang down onto the floor.  Have a firm chair that has side arms. You can use  this for support while you get dressed.  Do not have throw rugs and other things on the floor that can make you trip. What can I do in the kitchen?  Clean up any spills right away.  Avoid walking on wet floors.  Keep items that you use a lot in easy-to-reach places.  If you need to reach something above you, use a strong step stool that has a grab bar.  Keep electrical cords out of the way.  Do not use floor polish or wax that makes floors slippery. If you must use wax, use non-skid floor wax.  Do not have throw rugs and other things on the floor that can make you trip. What can I do with my stairs?  Do not leave any items on the stairs.  Make sure that there are handrails on both sides of the stairs and use them. Fix handrails that are broken or loose. Make sure that handrails are as long as the stairways.  Check any carpeting to make sure that it is firmly  attached to the stairs. Fix any carpet that is loose or worn.  Avoid having throw rugs at the top or bottom of the stairs. If you do have throw rugs, attach them to the floor with carpet tape.  Make sure that you have a light switch at the top of the stairs and the bottom of the stairs. If you do not have them, ask someone to add them for you. What else can I do to help prevent falls?  Wear shoes that:  Do not have high heels.  Have rubber bottoms.  Are comfortable and fit you well.  Are closed at the toe. Do not wear sandals.  If you use a stepladder:  Make sure that it is fully opened. Do not climb a closed stepladder.  Make sure that both sides of the stepladder are locked into place.  Ask someone to hold it for you, if possible.  Clearly mark and make sure that you can see:  Any grab bars or handrails.  First and last steps.  Where the edge of each step is.  Use tools that help you move around (mobility aids) if they are needed. These include:  Canes.  Walkers.  Scooters.  Crutches.  Turn on the lights when you go into a dark area. Replace any light bulbs as soon as they burn out.  Set up your furniture so you have a clear path. Avoid moving your furniture around.  If any of your floors are uneven, fix them.  If there are any pets around you, be aware of where they are.  Review your medicines with your doctor. Some medicines can make you feel dizzy. This can increase your chance of falling. Ask your doctor what other things that you can do to help prevent falls. This information is not intended to replace advice given to you by your health care provider. Make sure you discuss any questions you have with your health care provider. Document Released: 03/27/2009 Document Revised: 11/06/2015 Document Reviewed: 07/05/2014 Elsevier Interactive Patient Education  2017 [employer].

## 2016-11-12 NOTE — Progress Notes (Signed)
Subjective:   Daisy [name] is a 51 y.o. female who presents for an Initial Medicare Annual Wellness Visit.  Review of Systems    N/A        Objective:    Today's Vitals   11/12/16 1057 11/12/16 1105  BP: 124/68   Pulse: 80   Temp: 99.3 F (37.4 C)   TempSrc: Oral   Weight: 289 lb 6.4 oz (131.3 kg)   Height: 5\' 4"  (1.626 m)   PainSc: 7  7   PainLoc: Back    Body mass index is 49.68 kg/m.   Current Medications (verified) Outpatient Encounter Prescriptions as of 11/12/2016  Medication Sig  . alprazolam (XANAX) 2 MG tablet Take 2 mg by mouth 3 (three) times daily as needed for anxiety.   Marland Kitchen. amLODipine (NORVASC) 5 MG tablet Take 1 tablet (5 mg total) by mouth daily.  . cloNIDine (CATAPRES - DOSED IN MG/24 HR) 0.1 mg/24hr patch Place 1 patch (0.1 mg total) onto the skin once a week.  Marland Kitchen. FLUoxetine (PROZAC) 20 MG capsule Take 20 mg by mouth daily.  . furosemide (LASIX) 20 MG tablet Take 1 tablet (20 mg total) by mouth daily as needed for edema.  Marland Kitchen. lamoTRIgine (LAMICTAL) 100 MG tablet Take 100 mg by mouth daily.  Marland Kitchen. lisinopril (PRINIVIL,ZESTRIL) 20 MG tablet Take 1.5 tablets (30 mg total) by mouth daily.  . metFORMIN (GLUCOPHAGE) 500 MG tablet Take 1 tablet (500 mg total) by mouth daily.  . metoprolol succinate (TOPROL-XL) 25 MG 24 hr tablet Take 2 tablets (50 mg total) by mouth daily.  . Oxycodone HCl 10 MG TABS Take 4-7 tablets daily  . pantoprazole (PROTONIX) 40 MG tablet Take 1 tablet (40 mg total) by mouth 2 (two) times daily.  . potassium chloride SA (K-DUR,KLOR-CON) 20 MEQ tablet Take 1 tablet (20 mEq total) by mouth daily. Take with furosemide  . promethazine (PHENERGAN) 12.5 MG tablet Take 1 tablet (12.5 mg total) by mouth every 6 (six) hours as needed for refractory nausea / vomiting.  . ranitidine (ZANTAC) 150 MG tablet Take 1 tablet (150 mg total) by mouth at bedtime. (Patient taking differently: Take 150 mg by mouth at bedtime. )  . sucralfate (CARAFATE) 1 g tablet  Take 1 tablet (1 g total) by mouth 4 (four) times daily.  Marland Kitchen. tiZANidine (ZANAFLEX) 4 MG tablet Take 4-12 mg by mouth 3 (three) times daily. 1-3 tablets per day as tolerated  . traZODone (DESYREL) 100 MG tablet Take 100 mg by mouth at bedtime as needed.   . zolpidem (AMBIEN CR) 12.5 MG CR tablet Take 12.5 mg by mouth at bedtime as needed for sleep.  Marland Kitchen. albuterol (PROVENTIL HFA;VENTOLIN HFA) 108 (90 Base) MCG/ACT inhaler Inhale 2 puffs into the lungs every 6 (six) hours as needed for wheezing or shortness of breath. (Patient not taking: Reported on 11/12/2016)  . metroNIDAZOLE (FLAGYL) 500 MG tablet Take 1 tablet (500 mg total) by mouth 3 (three) times daily. (Patient not taking: Reported on 11/12/2016)  . ondansetron (ZOFRAN) 4 MG tablet Take 4 mg by mouth every 8 (eight) hours as needed for nausea.  . [DISCONTINUED] cephALEXin (KEFLEX) 500 MG capsule Take 1 capsule (500 mg total) by mouth 2 (two) times daily. (Patient not taking: Reported on 11/12/2016)   No facility-administered encounter medications on file as of 11/12/2016.     Allergies (verified) Acetaminophen-codeine; Codeine; Ibuprofen; Morphine; Propoxyphene; Sulfa antibiotics; and Duloxetine   History: Past Medical History:  Diagnosis Date  .  Migraine    Past Surgical History:  Procedure Laterality Date  . ABDOMINAL HYSTERECTOMY    . APPENDECTOMY    . APPLICATION OF WOUND VAC    . CHOLECYSTECTOMY    . HERNIA REPAIR    . KNEE SURGERY     Family History  Problem Relation Age of Onset  . Arthritis Mother   . Depression Mother   . Diabetes Mother   . Heart disease Mother   . Hyperlipidemia Mother   . Hypertension Mother   . Stroke Mother   . Arthritis Father   . Diabetes Father   . Hearing loss Father   . Heart disease Father   . Hyperlipidemia Father   . Hypertension Father    Social History   Occupational History  . disabled    Social History Main Topics  . Smoking status: Never Smoker  . Smokeless tobacco: Never Used   . Alcohol use 0.0 oz/week     Comment: occasional  . Drug use: No  . Sexual activity: Yes    Tobacco Counseling Counseling given: Not Answered   Activities of Daily Living In your present state of health, do you have any difficulty performing the following activities: 03/23/2016  Hearing? N  Vision? N  Difficulty concentrating or making decisions? N  Walking or climbing stairs? N  Dressing or bathing? N  Doing errands, shopping? N  Some recent data might be hidden    Immunizations and Health Maintenance Immunization History  Administered Date(s) Administered  . Influenza-Unspecified 06/03/2009  . Pneumococcal Polysaccharide-23 07/17/2012   Health Maintenance Due  Topic Date Due  . FOOT EXAM  01/26/1978  . OPHTHALMOLOGY EXAM  01/26/1978  . HIV Screening  01/27/1983  . TETANUS/TDAP  01/27/1987  . PAP SMEAR  01/26/1989    Patient Care Team: Malva Limes, MD as PCP - General (Family Medicine) Lynett Fish MD as Referring Physician (Psychiatry) Ewing Schlein, MD as Attending Physician (Pain Medicine)  Indicate any recent Medical Services you may have received from other than [facility] providers in the past year (date may be approximate).     Assessment:   This is a routine wellness examination for Darnita.   Hearing/Vision screen Vision Screening Comments: Pt does have regular vision checks. No current complaints.   Dietary issues and exercise activities discussed:    Goals    None     Depression Screen PHQ 2/9 Scores 02/05/2016 12/11/2015 11/13/2015 10/14/2015 09/18/2015 08/19/2015 07/22/2015  PHQ - 2 Score - - - 0 - 0 0  PHQ- 9 Score - - - - - - -  Exception Documentation Patient refusal Patient refusal Patient refusal - Patient refusal Patient refusal -    Fall Risk Fall Risk  02/05/2016 12/11/2015 11/13/2015 10/14/2015 09/18/2015  Falls in the past year? No (No Data) No No No  Number falls in past yr: - - - - -  Injury with Fall? - - - - -  Risk for fall due to : - - - -  -  Risk for fall due to (comments): - - - - -  Follow up - - - - -    Cognitive Function:        Screening Tests Health Maintenance  Topic Date Due  . FOOT EXAM  01/26/1978  . OPHTHALMOLOGY EXAM  01/26/1978  . HIV Screening  01/27/1983  . TETANUS/TDAP  01/27/1987  . PAP SMEAR  01/26/1989  . PNEUMOCOCCAL POLYSACCHARIDE VACCINE (2) 01/26/2034 (Originally 07/17/2017)  . INFLUENZA VACCINE  01/12/2017  . HEMOGLOBIN A1C  03/16/2017      Plan:  I have personally reviewed and addressed the Medicare Annual Wellness questionnaire and have noted the following in the patient's chart:  A. Medical and social history B. Use of alcohol, tobacco or illicit drugs  C. Current medications and supplements D. Functional ability and status E.  Nutritional status F.  Physical activity G. Advance directives H. List of other physicians I.  Hospitalizations, surgeries, and ER visits in previous 12 months J.  Vitals K. Screenings such as hearing and vision if needed, cognitive and depression L. Referrals and appointments - none  In addition, I have reviewed and discussed with patient certain preventive protocols, quality metrics, and best practice recommendations. A written personalized care plan for preventive services as well as general preventive health recommendations were provided to patient.  See attached scanned questionnaire for additional information.   Signed,  Hyacinth Meeker, LPN Nurse Health Advisor   MD Recommendations: Pt declined tetanus vaccine and HIV screening today. Pt to schedule an eye exam within the next year.

## 2016-11-12 NOTE — Progress Notes (Signed)
Patient: Daisy [name] Female    DOB: 14-Feb-1966   51 y.o.   MRN: [id number] Visit Date: 11/12/2016  Today's Provider: Mila Merry, MD   Chief Complaint  Patient presents with  . Vaginal Itching   Subjective:    HPI Patient comes in today c/o nausea, low grade fever, chills, and flank pain. She reports that she has had symptoms for 1 week. Patient also mentions that she has had vaginal itching as well. Patient denies any odor or burning on urination. She has been using OTC vaginal cream with no relief.  She reports history of abdominal surgical incision hernia that developed right lower abdomen after having appendectomy about 3 years ago. She states she had hernia repaired one time, but it still flares up from time to time. She states current abdominal pain is in same area as previous hernia and feels similar. She occasionally feels a lump in this area.  She has not been eating much due to nausea, but has been drinking fluids persistently.     Allergies  Allergen Reactions  . Acetaminophen-Codeine Hives  . Codeine Hives  . Ibuprofen Nausea And Vomiting  . Morphine Nausea And Vomiting  . Propoxyphene Other (See Comments)    Reaction:  GI upset   . Sulfa Antibiotics Hives  . Duloxetine Rash and Other (See Comments)    Yellowing of the skin     Current Outpatient Prescriptions:  .  alprazolam (XANAX) 2 MG tablet, Take 2 mg by mouth 3 (three) times daily as needed for anxiety. , Disp: , Rfl:  .  amLODipine (NORVASC) 5 MG tablet, Take 1 tablet (5 mg total) by mouth daily., Disp: 90 tablet, Rfl: 3 .  cloNIDine (CATAPRES - DOSED IN MG/24 HR) 0.1 mg/24hr patch, Place 1 patch (0.1 mg total) onto the skin once a week., Disp: 4 patch, Rfl: 5 .  FLUoxetine (PROZAC) 20 MG capsule, Take 20 mg by mouth daily., Disp: , Rfl:  .  furosemide (LASIX) 20 MG tablet, Take 1 tablet (20 mg total) by mouth daily as needed for edema., Disp: 90 tablet, Rfl: 1 .  lamoTRIgine (LAMICTAL) 100 MG  tablet, Take 100 mg by mouth daily., Disp: , Rfl:  .  lisinopril (PRINIVIL,ZESTRIL) 20 MG tablet, Take 1.5 tablets (30 mg total) by mouth daily., Disp: 90 tablet, Rfl: 2 .  metFORMIN (GLUCOPHAGE) 500 MG tablet, Take 1 tablet (500 mg total) by mouth daily., Disp: 90 tablet, Rfl: 4 .  metoprolol succinate (TOPROL-XL) 25 MG 24 hr tablet, Take 2 tablets (50 mg total) by mouth daily., Disp: 180 tablet, Rfl: 3 .  Oxycodone HCl 10 MG TABS, Take 4-7 tablets daily, Disp: , Rfl:  .  pantoprazole (PROTONIX) 40 MG tablet, Take 1 tablet (40 mg total) by mouth 2 (two) times daily., Disp: 180 tablet, Rfl: 4 .  potassium chloride SA (K-DUR,KLOR-CON) 20 MEQ tablet, Take 1 tablet (20 mEq total) by mouth daily. Take with furosemide, Disp: 90 tablet, Rfl: 1 .  promethazine (PHENERGAN) 12.5 MG tablet, Take 1 tablet (12.5 mg total) by mouth every 6 (six) hours as needed for refractory nausea / vomiting., Disp: 30 tablet, Rfl: 3 .  ranitidine (ZANTAC) 150 MG tablet, Take 1 tablet (150 mg total) by mouth at bedtime. (Patient taking differently: Take 150 mg by mouth at bedtime. ), Disp: 90 tablet, Rfl: 4 .  sucralfate (CARAFATE) 1 g tablet, Take 1 tablet (1 g total) by mouth 4 (four) times daily., Disp:  100 tablet, Rfl: 1 .  tiZANidine (ZANAFLEX) 4 MG tablet, Take 4-12 mg by mouth 3 (three) times daily. 1-3 tablets per day as tolerated, Disp: , Rfl:  .  traZODone (DESYREL) 100 MG tablet, Take 100 mg by mouth at bedtime as needed. , Disp: , Rfl:  .  zolpidem (AMBIEN CR) 12.5 MG CR tablet, Take 12.5 mg by mouth at bedtime as needed for sleep., Disp: , Rfl:  .  albuterol (PROVENTIL HFA;VENTOLIN HFA) 108 (90 Base) MCG/ACT inhaler, Inhale 2 puffs into the lungs every 6 (six) hours as needed for wheezing or shortness of breath. (Patient not taking: Reported on 11/12/2016), Disp: 1 Inhaler, Rfl: 2 .  metroNIDAZOLE (FLAGYL) 500 MG tablet, Take 1 tablet (500 mg total) by mouth 3 (three) times daily. (Patient not taking: Reported on  11/12/2016), Disp: 21 tablet, Rfl: 0 .  ondansetron (ZOFRAN) 4 MG tablet, Take 4 mg by mouth every 8 (eight) hours as needed for nausea., Disp: , Rfl:   Review of Systems  Constitutional: Negative for appetite change, chills, fatigue and fever.  Respiratory: Negative for chest tightness and shortness of breath.   Cardiovascular: Negative for chest pain and palpitations.  Gastrointestinal: Negative for abdominal pain, nausea and vomiting.  Neurological: Negative for dizziness and weakness.    Social History  Substance Use Topics  . Smoking status: Never Smoker  . Smokeless tobacco: Former Neurosurgeon    Types: Snuff  . Alcohol use 0.0 oz/week     Comment: only on special occasions- wine   Objective:   Vital Signs - Last Recorded  Most recent update: 11/12/2016 11:05 AM by Hyacinth Meeker, LPN  BP  469/62 (BP Location: Left Arm)     Pulse  80     Temp  99.3 F (37.4 C) (Oral)     Ht  5\' 4"  (1.626 m)     Wt  289 lb 6.4 oz (131.3 kg)      BMI  49.68 kg/m      Vitals History  BMI and BSA Data   Body Mass Index: 49.68 kg/m Body Surface Area: 2.43 m    Physical Exam  General Appearance:    Alert, cooperative, no distress, well appearing.   Eyes:    PERRL, conjunctiva/corneas clear, EOM's intact       Lungs:     Clear to auscultation bilaterally, respirations unlabored  Heart:    Regular rate and rhythm  Abdomen:   Morbidly obese. Tender superior to surgical incision of RLQ, but no masses palpated. No rebound or guarding. Marland Kitchen No CVA tenderness   Results for orders placed or performed in visit on 11/12/16  POCT urinalysis dipstick  Result Value Ref Range   Color, UA amber    Clarity, UA clear    Glucose, UA negative    Bilirubin, UA negative    Ketones, UA negative    Spec Grav, UA 1.015 1.010 - 1.025   Blood, UA negative    pH, UA 6.5 5.0 - 8.0   Protein, UA negative    Urobilinogen, UA 0.2 0.2 or 1.0 E.U./dL   Nitrite, UA negative    Leukocytes, UA Trace (A)  Negative        Assessment & Plan:     1. Dysuria Cover for UTI with Cipro 500mg  BID while awaiting curlutre results.  - POCT urinalysis dipstick - Urine culture  2. RLQ abdominal pain She refuses any further emergent evaluation today but agrees to go to ER if pain  worsens, if she has any fever, or if she is unable to keep down liquids to keep hydrated. She feels this is a recurrence of her incisional hernia. She is to call if not better next week and will discuss imaging studies.        Mila Merryonald Taniesha Glanz, MD  [facility]

## 2016-11-14 LAB — URINE CULTURE

## 2016-11-16 DIAGNOSIS — M5137 Other intervertebral disc degeneration, lumbosacral region: Secondary | ICD-10-CM | POA: Diagnosis not present

## 2016-11-16 DIAGNOSIS — Z79891 Long term (current) use of opiate analgesic: Secondary | ICD-10-CM | POA: Diagnosis not present

## 2016-11-16 DIAGNOSIS — M791 Myalgia: Secondary | ICD-10-CM | POA: Diagnosis not present

## 2016-11-16 DIAGNOSIS — M5416 Radiculopathy, lumbar region: Secondary | ICD-10-CM | POA: Diagnosis not present

## 2016-11-16 DIAGNOSIS — M545 Low back pain: Secondary | ICD-10-CM | POA: Diagnosis not present

## 2016-11-16 DIAGNOSIS — M47817 Spondylosis without myelopathy or radiculopathy, lumbosacral region: Secondary | ICD-10-CM | POA: Diagnosis not present

## 2016-11-16 DIAGNOSIS — M5418 Radiculopathy, sacral and sacrococcygeal region: Secondary | ICD-10-CM | POA: Diagnosis not present

## 2016-11-16 DIAGNOSIS — M5136 Other intervertebral disc degeneration, lumbar region: Secondary | ICD-10-CM | POA: Diagnosis not present

## 2016-11-16 DIAGNOSIS — M5481 Occipital neuralgia: Secondary | ICD-10-CM | POA: Diagnosis not present

## 2016-11-30 ENCOUNTER — Other Ambulatory Visit: Payer: Self-pay | Admitting: Family Medicine

## 2016-12-14 ENCOUNTER — Encounter: Payer: Self-pay | Admitting: Family Medicine

## 2016-12-14 DIAGNOSIS — M5481 Occipital neuralgia: Secondary | ICD-10-CM | POA: Diagnosis not present

## 2016-12-14 DIAGNOSIS — M545 Low back pain: Secondary | ICD-10-CM | POA: Diagnosis not present

## 2016-12-14 DIAGNOSIS — M5416 Radiculopathy, lumbar region: Secondary | ICD-10-CM | POA: Diagnosis not present

## 2016-12-14 DIAGNOSIS — M5136 Other intervertebral disc degeneration, lumbar region: Secondary | ICD-10-CM | POA: Diagnosis not present

## 2016-12-14 DIAGNOSIS — M47817 Spondylosis without myelopathy or radiculopathy, lumbosacral region: Secondary | ICD-10-CM | POA: Diagnosis not present

## 2016-12-14 DIAGNOSIS — M5418 Radiculopathy, sacral and sacrococcygeal region: Secondary | ICD-10-CM | POA: Diagnosis not present

## 2016-12-14 DIAGNOSIS — Z79891 Long term (current) use of opiate analgesic: Secondary | ICD-10-CM | POA: Diagnosis not present

## 2016-12-14 DIAGNOSIS — M791 Myalgia: Secondary | ICD-10-CM | POA: Diagnosis not present

## 2016-12-14 DIAGNOSIS — M5137 Other intervertebral disc degeneration, lumbosacral region: Secondary | ICD-10-CM | POA: Diagnosis not present

## 2016-12-19 ENCOUNTER — Emergency Department
Admission: EM | Admit: 2016-12-19 | Discharge: 2016-12-19 | Disposition: A | Discharge status: Home / Self Care | Payer: Medicare Other | Attending: Emergency Medicine | Admitting: Emergency Medicine

## 2016-12-19 ENCOUNTER — Emergency Department: Payer: Medicare Other

## 2016-12-19 ENCOUNTER — Encounter: Payer: Self-pay | Admitting: Emergency Medicine

## 2016-12-19 DIAGNOSIS — K469 Unspecified abdominal hernia without obstruction or gangrene: Secondary | ICD-10-CM | POA: Diagnosis not present

## 2016-12-19 DIAGNOSIS — K432 Incisional hernia without obstruction or gangrene: Secondary | ICD-10-CM | POA: Diagnosis not present

## 2016-12-19 DIAGNOSIS — Y929 Unspecified place or not applicable: Secondary | ICD-10-CM | POA: Insufficient documentation

## 2016-12-19 DIAGNOSIS — Y939 Activity, unspecified: Secondary | ICD-10-CM | POA: Diagnosis not present

## 2016-12-19 DIAGNOSIS — K458 Other specified abdominal hernia without obstruction or gangrene: Secondary | ICD-10-CM

## 2016-12-19 DIAGNOSIS — Z87891 Personal history of nicotine dependence: Secondary | ICD-10-CM | POA: Diagnosis not present

## 2016-12-19 DIAGNOSIS — R109 Unspecified abdominal pain: Secondary | ICD-10-CM | POA: Diagnosis not present

## 2016-12-19 DIAGNOSIS — R1084 Generalized abdominal pain: Secondary | ICD-10-CM | POA: Diagnosis not present

## 2016-12-19 DIAGNOSIS — S301XXA Contusion of abdominal wall, initial encounter: Secondary | ICD-10-CM | POA: Insufficient documentation

## 2016-12-19 DIAGNOSIS — W19XXXA Unspecified fall, initial encounter: Secondary | ICD-10-CM | POA: Insufficient documentation

## 2016-12-19 DIAGNOSIS — E1140 Type 2 diabetes mellitus with diabetic neuropathy, unspecified: Secondary | ICD-10-CM | POA: Diagnosis not present

## 2016-12-19 DIAGNOSIS — Z7984 Long term (current) use of oral hypoglycemic drugs: Secondary | ICD-10-CM | POA: Insufficient documentation

## 2016-12-19 DIAGNOSIS — Z79899 Other long term (current) drug therapy: Secondary | ICD-10-CM | POA: Diagnosis not present

## 2016-12-19 DIAGNOSIS — Y999 Unspecified external cause status: Secondary | ICD-10-CM | POA: Insufficient documentation

## 2016-12-19 DIAGNOSIS — S3991XA Unspecified injury of abdomen, initial encounter: Secondary | ICD-10-CM | POA: Diagnosis not present

## 2016-12-19 LAB — COMPREHENSIVE METABOLIC PANEL
ALBUMIN: 4.1 g/dL (ref 3.5–5.0)
ALK PHOS: 54 U/L (ref 38–126)
ALT: 13 U/L — AB (ref 14–54)
AST: 17 U/L (ref 15–41)
Anion gap: 11 (ref 5–15)
BUN: 10 mg/dL (ref 6–20)
CALCIUM: 9.6 mg/dL (ref 8.9–10.3)
CO2: 29 mmol/L (ref 22–32)
CREATININE: 0.78 mg/dL (ref 0.44–1.00)
Chloride: 96 mmol/L — ABNORMAL LOW (ref 101–111)
GFR calc Af Amer: >60 mL/min (ref >60)
GFR calc non Af Amer: >60 mL/min (ref >60)
GLUCOSE: 108 mg/dL — AB (ref 65–99)
Potassium: 3.9 mmol/L (ref 3.5–5.1)
SODIUM: 136 mmol/L (ref 135–145)
Total Bilirubin: 0.7 mg/dL (ref 0.3–1.2)
Total Protein: 8.4 g/dL — ABNORMAL HIGH (ref 6.5–8.1)

## 2016-12-19 LAB — LIPASE, BLOOD: Lipase: 18 U/L (ref 11–51)

## 2016-12-19 LAB — CBC
HCT: 41.1 % (ref 35.0–47.0)
HEMOGLOBIN: 13.4 g/dL (ref 12.0–16.0)
MCH: 27.9 pg (ref 26.0–34.0)
MCHC: 32.7 g/dL (ref 32.0–36.0)
MCV: 85.3 fL (ref 80.0–100.0)
PLATELETS: 329 10*3/uL (ref 150–440)
RBC: 4.82 MIL/uL (ref 3.80–5.20)
RDW: 16.8 % — AB (ref 11.5–14.5)
WBC: 8.1 10*3/uL (ref 3.6–11.0)

## 2016-12-19 LAB — LACTIC ACID, PLASMA: LACTIC ACID, VENOUS: 1.2 mmol/L (ref 0.5–1.9)

## 2016-12-19 MED ORDER — FENTANYL CITRATE (PF) 100 MCG/2ML IJ SOLN
75.0000 ug | Freq: Once | INTRAMUSCULAR | Status: AC
  Administered 2016-12-19: 75 ug via INTRAVENOUS
  Filled 2016-12-19: qty 2

## 2016-12-19 MED ORDER — HYDROCODONE-ACETAMINOPHEN 5-325 MG PO TABS: 1.0000 | ORAL_TABLET | Freq: Four times a day (QID) | ORAL | 0 refills | Status: DC | PRN

## 2016-12-19 MED ORDER — IOPAMIDOL (ISOVUE-300) INJECTION 61%
125.0000 mL | Freq: Once | INTRAVENOUS | Status: AC | PRN
  Administered 2016-12-19: 125 mL via INTRAVENOUS

## 2016-12-19 MED ORDER — HYDROCODONE-ACETAMINOPHEN 5-325 MG PO TABS
1.0000 | ORAL_TABLET | Freq: Once | ORAL | Status: AC
  Administered 2016-12-19: 1 via ORAL
  Filled 2016-12-19: qty 1

## 2016-12-19 NOTE — Discharge Instructions (Signed)
Please follow-up with your primary care as needed and return to the emergency department for any concerns.  It was a pleasure to take care of you today, and thank you for coming to our emergency department.  If you have any questions or concerns before leaving please ask the nurse to grab me and I'm more than happy to go through your aftercare instructions again.  If you were prescribed any opioid pain medication today such as Norco, Vicodin, Percocet, morphine, hydrocodone, or oxycodone please make sure you do not drive when you are taking this medication as it can alter your ability to drive safely.  If you have any concerns once you are home that you are not improving or are in fact getting worse before you can make it to your follow-up appointment, please do not hesitate to call 911 and come back for further evaluation.  Merrily Brittle MD  Results for orders placed or performed during the hospital encounter of 12/19/16  Lipase, blood  Result Value Ref Range   Lipase 18 11 - 51 U/L  Comprehensive metabolic panel  Result Value Ref Range   Sodium 136 135 - 145 mmol/L   Potassium 3.9 3.5 - 5.1 mmol/L   Chloride 96 (L) 101 - 111 mmol/L   CO2 29 22 - 32 mmol/L   Glucose, Bld 108 (H) 65 - 99 mg/dL   BUN 10 6 - 20 mg/dL   Creatinine, Ser 4.09 0.44 - 1.00 mg/dL   Calcium 9.6 8.9 - 81.1 mg/dL   Total Protein 8.4 (H) 6.5 - 8.1 g/dL   Albumin 4.1 3.5 - 5.0 g/dL   AST 17 15 - 41 U/L   ALT 13 (L) 14 - 54 U/L   Alkaline Phosphatase 54 38 - 126 U/L   Total Bilirubin 0.7 0.3 - 1.2 mg/dL   GFR calc non Af Amer >60 >60 mL/min   GFR calc Af Amer >60 >60 mL/min   Anion gap 11 5 - 15  CBC  Result Value Ref Range   WBC 8.1 3.6 - 11.0 K/uL   RBC 4.82 3.80 - 5.20 MIL/uL   Hemoglobin 13.4 12.0 - 16.0 g/dL   HCT 91.4 78.2 - 95.6 %   MCV 85.3 80.0 - 100.0 fL   MCH 27.9 26.0 - 34.0 pg   MCHC 32.7 32.0 - 36.0 g/dL   RDW 21.3 (H) 08.6 - 57.8 %   Platelets 329 150 - 440 K/uL  Lactic acid, plasma    Result Value Ref Range   Lactic Acid, Venous 1.2 0.5 - 1.9 mmol/L   Ct Abdomen Pelvis W Contrast  Result Date: 12/19/2016 CLINICAL DATA:  Pain following fall.  History of ventral hernia EXAM: CT ABDOMEN AND PELVIS WITH CONTRAST TECHNIQUE: Multidetector CT imaging of the abdomen and pelvis was performed using the standard protocol following bolus administration of intravenous contrast. CONTRAST:  ISOVUE-300 IOPAMIDOL (ISOVUE-300) INJECTION 61% COMPARISON:  March 24, 2016 FINDINGS: Lower chest: There is mild left base atelectasis. There is a small hiatal hernia. Hepatobiliary: Liver measures 20.1 cm in length. No focal liver lesions are apparent. Gallbladder is absent. There is no biliary duct dilatation. Pancreas: There is no pancreatic mass or inflammatory focus. Spleen: No splenic lesions are evident. Adrenals/Urinary Tract: Adrenals appear normal bilaterally. Kidneys bilaterally show no evident mass or hydronephrosis on either side. There is no renal or ureteral calculus on either side. Urinary bladder is midline with wall thickness within normal limits. Stomach/Bowel: There are sigmoid diverticula without diverticulitis.  There is no appreciable bowel wall or mesenteric thickening. There is no bowel obstruction. No free air or portal venous air. Vascular/Lymphatic: There is no abdominal aortic aneurysm. No vascular calcifications are evident. There is no appreciable adenopathy in the abdomen or pelvis. Reproductive: Uterus is absent. There are apparent follicles arising from the left ovary, largest measuring 2.1 x 1.8 cm. Beyond physiologic follicles, no pelvic mass evident. Other: Appendix absent. There is a ventral hernia which contains loops of bowel but no bowel compromise. There is fat with mesenteric panniculitis within this hernia. This hernia measures 6.4 cm from right to left dimension and 5.6 cm from superior to inferior dimension. There is scarring in the anterior abdominal wall in the  area of this hernia, likely due to previous surgery in this area. There is a minimal ventral hernia at the level of the left lobe of the liver containing only fat. There is no abscess or ascites in the abdomen or pelvis. Musculoskeletal: There is degenerative change in the lower lumbar spine. There is lumbar levoscoliosis. There are no blastic or lytic bone lesions. There is no intramuscular lesion. IMPRESSION: 1. Ventral hernia containing loops of bowel without bowel compromise. There is fat with mild mesenteric panniculitis within this hernia. There is scarring in the anterior abdominal wall in the area of this hernia, likely at least in part due to postoperative change. 2. Tiny ventral hernia containing only fat at the level of the left lobe of the liver. 3. Sigmoid diverticula without diverticulitis. No bowel obstruction or bowel wall thickening. No demonstrable abscess. 4.  No renal or ureteral calculus.  No hydronephrosis. 5.  Gallbladder, uterus, and appendix absent. 6.  Small hiatal hernia. Electronically Signed   By: Bretta BangWilliam  Woodruff III M.D.   On: 12/19/2016 19:10

## 2016-12-19 NOTE — ED Provider Notes (Signed)
[facility] Emergency Department Provider Note  ____________________________________________   First MD Initiated Contact with Patient 12/19/16 1734     (approximate)  I have reviewed the triage vital signs and the nursing notes.   HISTORY  Chief Complaint Fall and Abdominal Pain   HPI Daisy [name] is a 51 y.o. female who presents to the emergency department via EMS with moderate severity cramping nonradiating right sided abdominal pain that all began after mechanical fall. She has a past medical history of 5 surgeries on her left knee and 2 on her right. She got up earlier today to walk and she felt her left knee "give out" and she fell to her knees then onto her abdomen. She has had multiple abdominal surgeries including cholecystectomy appendectomy and hysterectomy. She is also had a ventral hernia repair in the past which is failed. She is worried because she feels like her hernia is out and she can't get it back in. She is passing flatus. Nothing seems to make her pain better or worse.   Past Medical History:  Diagnosis Date  . Hernia of abdominal wall   . Migraine     Patient Active Problem List   Diagnosis Date Noted  . Hyperlipidemia 06/18/2016  . Controlled type 2 diabetes mellitus without complication, without long-term current use of insulin (HCC) 06/18/2016  . Bipolar 2 disorder (HCC) 06/18/2016  . Depression 06/18/2016  . GERD (gastroesophageal reflux disease) 06/18/2016  . Hypertension 06/18/2016  . Ingrown toenail 06/18/2016  . Hypokalemia 09/28/2015  . Chronic abdominal pain 05/23/2015  . Complex ovarian cyst 05/23/2015  . Anxiety 03/27/2015  . Insomnia 03/27/2015  . Gastritis 01/07/2015  . DDD (degenerative disc disease), lumbar 11/28/2014  . DJD (degenerative joint disease) of knee 11/28/2014  . Migraine headache 11/28/2014  . Bilateral occipital neuralgia 11/28/2014  . Morbid obesity (HCC) 11/28/2014  . Neuropathy due to  secondary diabetes (HCC) 11/28/2014  . Abdominal wall abscess 09/20/2014  . Back pain, chronic 09/20/2014  . Incisional hernia, without obstruction or gangrene 04/30/2014  . Bilateral chronic knee pain 03/04/2014    Past Surgical History:  Procedure Laterality Date  . ABDOMINAL HYSTERECTOMY    . APPENDECTOMY    . APPLICATION OF WOUND VAC    . CHOLECYSTECTOMY    . HERNIA REPAIR    . KNEE SURGERY      Prior to Admission medications   Medication Sig Start Date End Date Taking? Authorizing Provider  albuterol (PROVENTIL HFA;VENTOLIN HFA) 108 (90 Base) MCG/ACT inhaler Inhale 2 puffs into the lungs every 6 (six) hours as needed for wheezing or shortness of breath. 07/27/16 12/19/16 Yes Malva LimesFisher, Donald E, MD  alprazolam Prudy Feeler(XANAX) 2 MG tablet Take 2 mg by mouth 3 (three) times daily as needed for anxiety.    Yes [provider]  amLODipine (NORVASC) 5 MG tablet Take 1 tablet (5 mg total) by mouth daily. 06/18/16  Yes Malva LimesFisher, Donald E, MD  cholecalciferol (VITAMIN D) 1000 units tablet Take 1,000 Units by mouth daily.   Yes [provider]  cloNIDine (CATAPRES - DOSED IN MG/24 HR) 0.1 mg/24hr patch PLACE 1 PATCH (0.1 MG TOTAL) ONTO THE SKIN ONCE A WEEK. 11/30/16  Yes Malva LimesFisher, Donald E, MD  FLUoxetine (PROZAC) 20 MG capsule Take 20 mg by mouth daily.   Yes [provider]  furosemide (LASIX) 20 MG tablet Take 1 tablet (20 mg total) by mouth daily as needed for edema. Patient taking differently: Take 20 mg by  mouth every other day.  06/18/16  Yes Malva Limes, MD  lamoTRIgine (LAMICTAL) 100 MG tablet Take 100 mg by mouth daily.   Yes [provider]  lisinopril (PRINIVIL,ZESTRIL) 20 MG tablet Take 1.5 tablets (30 mg total) by mouth daily. Patient taking differently: Take 20 mg by mouth daily.  11/10/16  Yes Malva Limes, MD  metFORMIN (GLUCOPHAGE) 500 MG tablet Take 1 tablet (500 mg total) by mouth daily. 06/18/16  Yes Malva Limes, MD  metoprolol succinate  (TOPROL-XL) 25 MG 24 hr tablet Take 2 tablets (50 mg total) by mouth daily. 06/18/16  Yes Malva Limes, MD  ondansetron (ZOFRAN) 4 MG tablet Take 4 mg by mouth every 8 (eight) hours as needed for nausea.   Yes [provider]  Oxycodone HCl 10 MG TABS Take 10 mg by mouth every 4 (four) hours as needed. Take 4-7 tablets daily 05/31/16  Yes [provider]  pantoprazole (PROTONIX) 40 MG tablet Take 1 tablet (40 mg total) by mouth 2 (two) times daily. 11/10/16  Yes Malva Limes, MD  potassium chloride SA (K-DUR,KLOR-CON) 20 MEQ tablet Take 1 tablet (20 mEq total) by mouth daily. Take with furosemide Patient taking differently: Take 20 mEq by mouth every other day. Take with furosemide 06/18/16  Yes Malva Limes, MD  sucralfate (CARAFATE) 1 g tablet Take 1 tablet (1 g total) by mouth 4 (four) times daily. 03/26/16  Yes Altamese Dilling, MD  tiZANidine (ZANAFLEX) 4 MG tablet Take 4-12 mg by mouth 3 (three) times daily. 1-3 tablets per day as tolerated   Yes [provider]  traZODone (DESYREL) 100 MG tablet Take 100 mg by mouth at bedtime.  10/12/16  Yes [provider]  vitamin B-12 (CYANOCOBALAMIN) 1000 MCG tablet Take 1,000 mcg by mouth daily.   Yes [provider]  zolpidem (AMBIEN) 10 MG tablet Take 10 mg by mouth at bedtime.   Yes [provider]  HYDROcodone-acetaminophen (NORCO) 5-325 MG tablet Take 1 tablet by mouth every 6 (six) hours as needed for severe pain. 12/19/16   Merrily Brittle, MD  promethazine (PHENERGAN) 12.5 MG tablet Take 1 tablet (12.5 mg total) by mouth every 6 (six) hours as needed for refractory nausea / vomiting. 06/28/16   Malva Limes, MD  ranitidine (ZANTAC) 150 MG tablet Take 1 tablet (150 mg total) by mouth at bedtime. Patient not taking: Reported on 12/19/2016 06/18/16   Malva Limes, MD    Allergies Acetaminophen-codeine; Codeine; Ibuprofen; Morphine; Propoxyphene; Sulfa antibiotics; and  Duloxetine  Family History  Problem Relation Age of Onset  . Arthritis Mother   . Depression Mother   . Diabetes Mother   . Heart disease Mother   . Hyperlipidemia Mother   . Hypertension Mother   . Stroke Mother   . Arthritis Father   . Diabetes Father   . Hearing loss Father   . Heart disease Father   . Hyperlipidemia Father   . Hypertension Father     Social History Social History  Substance Use Topics  . Smoking status: Never Smoker  . Smokeless tobacco: Former Neurosurgeon    Types: Snuff  . Alcohol use 0.0 oz/week     Comment: only on special occasions- wine    Review of Systems Constitutional: No fever/chills Eyes: No visual changes. ENT: No sore throat. Cardiovascular: Denies chest pain. Respiratory: Denies shortness of breath. Gastrointestinal: Positive abdominal pain.  No nausea, no vomiting.  No diarrhea.  No constipation.  Genitourinary: Negative for dysuria. Musculoskeletal: Negative for back pain. Skin: Negative for rash. Neurological: Negative for headaches, focal weakness or numbness.   ____________________________________________   PHYSICAL EXAM:  VITAL SIGNS: ED Triage Vitals  Enc Vitals Group     BP 12/19/16 1414 135/76     Pulse Rate 12/19/16 1414 82     Resp 12/19/16 1414 18     Temp 12/19/16 1414 98.8 F (37.1 C)     Temp Source 12/19/16 1414 Oral     SpO2 12/19/16 1414 98 %     Weight 12/19/16 1415 283 lb (128.4 kg)     Height 12/19/16 1415 5\' 5"  (1.651 m)     Head Circumference --      Peak Flow --      Pain Score --      Pain Loc --      Pain Edu? --      Excl. in GC? --     Constitutional: Alert and oriented 4 smiling joking and laughing very well-appearing Eyes: PERRL EOMI. Head: Atraumatic. Nose: No congestion/rhinnorhea. Mouth/Throat: No trismus Neck: No stridor.   Cardiovascular: Normal rate, regular rhythm. Grossly normal heart sounds.  Good peripheral circulation. Respiratory: Normal respiratory effort.  No retractions.  Lungs CTAB and moving good air Gastrointestinal: Morbidly obese abdomen with multiple well-healed surgical scars. No skin changes. Soft although mild diffuse tenderness to palpation. No focality no rebound no guarding. Unable to appreciate any hernias Musculoskeletal: No lower extremity edema   Neurologic:  Normal speech and language. No gross focal neurologic deficits are appreciated. Skin:  Skin is warm, dry and intact. No rash noted. Psychiatric: Mood and affect are normal. Speech and behavior are normal.    ____________________________________________   DIFFERENTIAL includes but not limited to  Volvulus, incarcerated hernia, strangulated hernia, contusion ____________________________________________   LABS (all labs ordered are listed, but only abnormal results are displayed)  Labs Reviewed  COMPREHENSIVE METABOLIC PANEL - Abnormal; Notable for the following:       Result Value   Chloride 96 (*)    Glucose, Bld 108 (*)    Total Protein 8.4 (*)    ALT 13 (*)    All other components within normal limits  CBC - Abnormal; Notable for the following:    RDW 16.8 (*)    All other components within normal limits  LIPASE, BLOOD  LACTIC ACID, PLASMA    Normal lactate suggestive against strangulated hernia __________________________________________  EKG   ____________________________________________  RADIOLOGY  CT scan shows a large hernia with no evidence of strangulation or incarceration ____________________________________________   PROCEDURES  Procedure(s) performed: no  Procedures  Critical Care performed: no  Observation: no ____________________________________________   INITIAL IMPRESSION / ASSESSMENT AND PLAN / ED COURSE  Pertinent labs & imaging results that were available during my care of the patient were reviewed by me and considered in my medical decision making (see chart for details).  The patient's abdominal exam is extremely challenging  secondary to her habitus. She says that she feels her hernia out and she is unable to put it back in when she normally can. The only way I am able to evaluate this hernias via CT scan.     The patient's CT scan is negative for incarceration strangulation or other acute surgical issues. Her hernia is quite large which makes it unlikely that it ever well actually strangulated. She R he has surgical and primary care follow-up. She is able to ambulate so she does not  warrant any imaging of her legs. She is discharged home in improved condition. ____________________________________________   FINAL CLINICAL IMPRESSION(S) / ED DIAGNOSES  Final diagnoses:  Generalized abdominal pain  Recurrent abdominal hernia without obstruction or gangrene, unspecified hernia type  Contusion of abdominal wall, initial encounter      NEW MEDICATIONS STARTED DURING THIS VISIT:  Discharge Medication List as of 12/19/2016  7:34 PM    START taking these medications   Details  HYDROcodone-acetaminophen (NORCO) 5-325 MG tablet Take 1 tablet by mouth every 6 (six) hours as needed for severe pain., Starting Sun 12/19/2016, Print         Note:  This document was prepared using Dragon voice recognition software and may include unintentional dictation errors.     Merrily Brittle, MD 12/20/16 1527

## 2016-12-19 NOTE — ED Notes (Signed)
Pt assisted to the restroom with RN and this EDT present for safety concerns due to pt fall prior to ED visit. Pt able to walk independently with standby assist/ pt assisted back to bed (standby) by this tech. Pt ambulates with steady gait. No help needed

## 2016-12-19 NOTE — ED Notes (Signed)
Pt taken to lobby via wheelchair to wait for ride. Pt was able to stand and pivit into chair by self. Pt left ED with purse, cloths and shoes and denied having left anything in room. Pt has prescription and verbalized understanding on follow up and home care.

## 2016-12-19 NOTE — ED Notes (Signed)
Attempted blood draw x 2, lab notified and came to stick patient tried x 2.  Pt is not vomiting at this time. Pt placed out in the lobby to wait for room. Pt states the only place she can get any blood is in her EJ. Pt VS are stable at this time and in NAD.

## 2016-12-19 NOTE — ED Triage Notes (Addendum)
Pt arrived via EMS from home s/p fall. Pt states she fell onto her knees and then stomach when she was getting out of the bed. Pt denies any LOC and did not hit her head.  Pt states she has bad knees and her left gave out on her.  Pt states her hands came out in front before she hit the floor. Pt now c/o generalized pain. Pt has hx of hernia and is due to see PCP tomorrow for follow up. Pt states stomach hurts worse than previously from the hernia.

## 2016-12-21 ENCOUNTER — Telehealth: Payer: Self-pay | Admitting: Family Medicine

## 2016-12-21 DIAGNOSIS — K449 Diaphragmatic hernia without obstruction or gangrene: Secondary | ICD-10-CM

## 2016-12-21 NOTE — Telephone Encounter (Signed)
She needs referral to general surgery to discuss options for treatment.

## 2016-12-21 NOTE — Telephone Encounter (Signed)
Can you see the results from her abdominal CT? Would you just like the patient to come in for a ER follow up to discuss? Please advise. Thanks!

## 2016-12-21 NOTE — Telephone Encounter (Signed)
Pt would like to talk to you about the test results from her CT scan showing she has a hernia.  Her call back is [phone]  Thanks Barth Kirksteri

## 2016-12-22 NOTE — Telephone Encounter (Signed)
Advised patient as below. Order has been placed. [facility]. Thanks!

## 2016-12-23 DIAGNOSIS — Z79899 Other long term (current) drug therapy: Secondary | ICD-10-CM | POA: Diagnosis not present

## 2017-01-03 ENCOUNTER — Encounter: Payer: Self-pay | Admitting: Emergency Medicine

## 2017-01-03 ENCOUNTER — Observation Stay
Admission: EM | Admit: 2017-01-03 | Discharge: 2017-01-05 | Disposition: A | Discharge status: Home / Self Care | Payer: Medicare Other | Attending: Internal Medicine | Admitting: Internal Medicine

## 2017-01-03 ENCOUNTER — Emergency Department: Payer: Medicare Other

## 2017-01-03 DIAGNOSIS — Z79899 Other long term (current) drug therapy: Secondary | ICD-10-CM | POA: Insufficient documentation

## 2017-01-03 DIAGNOSIS — R1084 Generalized abdominal pain: Secondary | ICD-10-CM

## 2017-01-03 DIAGNOSIS — E876 Hypokalemia: Secondary | ICD-10-CM | POA: Insufficient documentation

## 2017-01-03 DIAGNOSIS — K439 Ventral hernia without obstruction or gangrene: Secondary | ICD-10-CM | POA: Diagnosis not present

## 2017-01-03 DIAGNOSIS — G43909 Migraine, unspecified, not intractable, without status migrainosus: Secondary | ICD-10-CM | POA: Diagnosis not present

## 2017-01-03 DIAGNOSIS — K2970 Gastritis, unspecified, without bleeding: Secondary | ICD-10-CM | POA: Insufficient documentation

## 2017-01-03 DIAGNOSIS — R112 Nausea with vomiting, unspecified: Secondary | ICD-10-CM | POA: Diagnosis not present

## 2017-01-03 DIAGNOSIS — R1111 Vomiting without nausea: Secondary | ICD-10-CM | POA: Diagnosis not present

## 2017-01-03 DIAGNOSIS — E1143 Type 2 diabetes mellitus with diabetic autonomic (poly)neuropathy: Secondary | ICD-10-CM | POA: Diagnosis not present

## 2017-01-03 DIAGNOSIS — K3184 Gastroparesis: Principal | ICD-10-CM | POA: Insufficient documentation

## 2017-01-03 DIAGNOSIS — R197 Diarrhea, unspecified: Secondary | ICD-10-CM

## 2017-01-03 DIAGNOSIS — I10 Essential (primary) hypertension: Secondary | ICD-10-CM | POA: Diagnosis not present

## 2017-01-03 DIAGNOSIS — E119 Type 2 diabetes mellitus without complications: Secondary | ICD-10-CM | POA: Diagnosis not present

## 2017-01-03 HISTORY — DX: Gastritis, unspecified, without bleeding: K29.70

## 2017-01-03 HISTORY — DX: Gastroparesis: K31.84

## 2017-01-03 LAB — URINALYSIS, COMPLETE (UACMP) WITH MICROSCOPIC
BILIRUBIN URINE: NEGATIVE
Glucose, UA: NEGATIVE mg/dL
KETONES UR: 5 mg/dL — AB
LEUKOCYTES UA: NEGATIVE
NITRITE: NEGATIVE
PROTEIN: 100 mg/dL — AB
Specific Gravity, Urine: 1.026 (ref 1.005–1.030)
pH: 6 (ref 5.0–8.0)

## 2017-01-03 LAB — COMPREHENSIVE METABOLIC PANEL
ALBUMIN: 4.4 g/dL (ref 3.5–5.0)
ALT: 11 U/L — ABNORMAL LOW (ref 14–54)
ANION GAP: 13 (ref 5–15)
AST: 16 U/L (ref 15–41)
Alkaline Phosphatase: 47 U/L (ref 38–126)
BILIRUBIN TOTAL: 1 mg/dL (ref 0.3–1.2)
BUN: 9 mg/dL (ref 6–20)
CHLORIDE: 104 mmol/L (ref 101–111)
CO2: 23 mmol/L (ref 22–32)
Calcium: 9.6 mg/dL (ref 8.9–10.3)
Creatinine, Ser: 0.83 mg/dL (ref 0.44–1.00)
GFR calc Af Amer: >60 mL/min (ref >60)
GLUCOSE: 166 mg/dL — AB (ref 65–99)
POTASSIUM: 3.2 mmol/L — AB (ref 3.5–5.1)
Sodium: 140 mmol/L (ref 135–145)
TOTAL PROTEIN: 8.9 g/dL — AB (ref 6.5–8.1)

## 2017-01-03 LAB — LACTIC ACID, PLASMA
LACTIC ACID, VENOUS: 1.7 mmol/L (ref 0.5–1.9)
Lactic Acid, Venous: 1.8 mmol/L (ref 0.5–1.9)

## 2017-01-03 LAB — CBC
HCT: 42.1 % (ref 35.0–47.0)
HEMOGLOBIN: 14 g/dL (ref 12.0–16.0)
MCH: 27.9 pg (ref 26.0–34.0)
MCHC: 33.4 g/dL (ref 32.0–36.0)
MCV: 83.6 fL (ref 80.0–100.0)
Platelets: 388 10*3/uL (ref 150–440)
RBC: 5.04 MIL/uL (ref 3.80–5.20)
RDW: 16.7 % — ABNORMAL HIGH (ref 11.5–14.5)
WBC: 14.8 10*3/uL — AB (ref 3.6–11.0)

## 2017-01-03 LAB — MAGNESIUM: MAGNESIUM: 1.4 mg/dL — AB (ref 1.7–2.4)

## 2017-01-03 LAB — GLUCOSE, CAPILLARY
Glucose-Capillary: 111 mg/dL — ABNORMAL HIGH (ref 65–99)
Glucose-Capillary: 168 mg/dL — ABNORMAL HIGH (ref 65–99)
Glucose-Capillary: 128 mg/dL — ABNORMAL HIGH (ref 65–99)

## 2017-01-03 LAB — LIPASE, BLOOD: LIPASE: 21 U/L (ref 11–51)

## 2017-01-03 MED ORDER — METOCLOPRAMIDE HCL 5 MG/ML IJ SOLN
INTRAMUSCULAR | Status: AC
  Administered 2017-01-03: 5 mg via INTRAVENOUS
  Filled 2017-01-03: qty 2

## 2017-01-03 MED ORDER — SODIUM CHLORIDE 0.9 % IV BOLUS (SEPSIS)
1000.0000 mL | Freq: Once | INTRAVENOUS | Status: AC
  Administered 2017-01-03: 1000 mL via INTRAVENOUS

## 2017-01-03 MED ORDER — INSULIN ASPART 100 UNIT/ML ~~LOC~~ SOLN
0.0000 [IU] | Freq: Three times a day (TID) | SUBCUTANEOUS | Status: DC
  Administered 2017-01-03: 2 [IU] via SUBCUTANEOUS
  Administered 2017-01-04 (×2): 1 [IU] via SUBCUTANEOUS
  Filled 2017-01-03 (×3): qty 1

## 2017-01-03 MED ORDER — HYDROMORPHONE HCL 1 MG/ML IJ SOLN
2.0000 mg | INTRAMUSCULAR | Status: DC | PRN
  Administered 2017-01-03 – 2017-01-04 (×4): 2 mg via INTRAVENOUS
  Filled 2017-01-03 (×4): qty 2

## 2017-01-03 MED ORDER — MAGNESIUM SULFATE 4 GM/100ML IV SOLN
4.0000 g | Freq: Once | INTRAVENOUS | Status: AC
  Administered 2017-01-03: 4 g via INTRAVENOUS
  Filled 2017-01-03: qty 100

## 2017-01-03 MED ORDER — TRAZODONE HCL 100 MG PO TABS
100.0000 mg | ORAL_TABLET | Freq: Every day | ORAL | Status: DC
  Administered 2017-01-03 – 2017-01-04 (×2): 100 mg via ORAL
  Filled 2017-01-03 (×3): qty 1

## 2017-01-03 MED ORDER — ONDANSETRON HCL 4 MG/2ML IJ SOLN
4.0000 mg | Freq: Four times a day (QID) | INTRAMUSCULAR | Status: DC | PRN
  Administered 2017-01-03 – 2017-01-04 (×2): 4 mg via INTRAVENOUS
  Filled 2017-01-03 (×2): qty 2

## 2017-01-03 MED ORDER — LISINOPRIL 20 MG PO TABS
20.0000 mg | ORAL_TABLET | Freq: Every day | ORAL | Status: DC
  Administered 2017-01-05: 20 mg via ORAL
  Filled 2017-01-03 (×3): qty 1

## 2017-01-03 MED ORDER — ALBUTEROL SULFATE (2.5 MG/3ML) 0.083% IN NEBU: 2.5000 mg | INHALATION_SOLUTION | Freq: Four times a day (QID) | RESPIRATORY_TRACT | Status: DC | PRN

## 2017-01-03 MED ORDER — HYDRALAZINE HCL 25 MG PO TABS
25.0000 mg | ORAL_TABLET | Freq: Three times a day (TID) | ORAL | Status: DC
  Administered 2017-01-03: 25 mg via ORAL
  Filled 2017-01-03 (×2): qty 1

## 2017-01-03 MED ORDER — PANTOPRAZOLE SODIUM 40 MG IV SOLR
40.0000 mg | Freq: Two times a day (BID) | INTRAVENOUS | Status: DC
  Administered 2017-01-03 – 2017-01-05 (×5): 40 mg via INTRAVENOUS
  Filled 2017-01-03 (×5): qty 40

## 2017-01-03 MED ORDER — SODIUM CHLORIDE 0.9 % IV SOLN
INTRAVENOUS | Status: DC
  Administered 2017-01-03 (×2): via INTRAVENOUS

## 2017-01-03 MED ORDER — ONDANSETRON HCL 4 MG/2ML IJ SOLN
4.0000 mg | Freq: Once | INTRAMUSCULAR | Status: DC | PRN
  Filled 2017-01-03: qty 2

## 2017-01-03 MED ORDER — HYDRALAZINE HCL 20 MG/ML IJ SOLN
10.0000 mg | INTRAMUSCULAR | Status: DC | PRN
  Administered 2017-01-03: 10 mg via INTRAVENOUS
  Filled 2017-01-03: qty 1

## 2017-01-03 MED ORDER — ENOXAPARIN SODIUM 40 MG/0.4ML ~~LOC~~ SOLN
40.0000 mg | Freq: Two times a day (BID) | SUBCUTANEOUS | Status: DC
  Administered 2017-01-03 – 2017-01-05 (×4): 40 mg via SUBCUTANEOUS
  Filled 2017-01-03 (×4): qty 0.4

## 2017-01-03 MED ORDER — INSULIN ASPART 100 UNIT/ML ~~LOC~~ SOLN: 0.0000 [IU] | Freq: Every day | SUBCUTANEOUS | Status: DC

## 2017-01-03 MED ORDER — PROMETHAZINE HCL 25 MG/ML IJ SOLN
12.5000 mg | Freq: Once | INTRAMUSCULAR | Status: AC
  Administered 2017-01-03: 12.5 mg via INTRAVENOUS
  Filled 2017-01-03: qty 1

## 2017-01-03 MED ORDER — ALPRAZOLAM 1 MG PO TABS
2.0000 mg | ORAL_TABLET | Freq: Three times a day (TID) | ORAL | Status: DC | PRN
  Administered 2017-01-03 – 2017-01-05 (×6): 2 mg via ORAL
  Filled 2017-01-03 (×6): qty 2

## 2017-01-03 MED ORDER — METOCLOPRAMIDE HCL 5 MG/ML IJ SOLN
5.0000 mg | Freq: Once | INTRAMUSCULAR | Status: AC
  Administered 2017-01-03: 5 mg via INTRAVENOUS
  Filled 2017-01-03: qty 2

## 2017-01-03 MED ORDER — AMLODIPINE BESYLATE 5 MG PO TABS
5.0000 mg | ORAL_TABLET | Freq: Every day | ORAL | Status: DC
  Administered 2017-01-05: 5 mg via ORAL
  Filled 2017-01-03 (×3): qty 1

## 2017-01-03 MED ORDER — CLONIDINE HCL 0.1 MG/24HR TD PTWK: 0.1000 mg | MEDICATED_PATCH | TRANSDERMAL | Status: DC

## 2017-01-03 MED ORDER — METOCLOPRAMIDE HCL 5 MG/ML IJ SOLN
5.0000 mg | Freq: Four times a day (QID) | INTRAMUSCULAR | Status: DC | PRN
  Administered 2017-01-03: 5 mg via INTRAVENOUS
  Filled 2017-01-03: qty 2

## 2017-01-03 MED ORDER — ENOXAPARIN SODIUM 40 MG/0.4ML ~~LOC~~ SOLN: 40.0000 mg | SUBCUTANEOUS | Status: DC

## 2017-01-03 MED ORDER — IOPAMIDOL (ISOVUE-300) INJECTION 61%
30.0000 mL | Freq: Once | INTRAVENOUS | Status: AC
  Administered 2017-01-03: 30 mL via ORAL

## 2017-01-03 MED ORDER — ALBUTEROL SULFATE HFA 108 (90 BASE) MCG/ACT IN AERS: 2.0000 | INHALATION_SPRAY | Freq: Four times a day (QID) | RESPIRATORY_TRACT | Status: DC | PRN

## 2017-01-03 MED ORDER — METOPROLOL SUCCINATE ER 50 MG PO TB24
50.0000 mg | ORAL_TABLET | Freq: Every day | ORAL | Status: DC
  Administered 2017-01-04 – 2017-01-05 (×2): 50 mg via ORAL
  Filled 2017-01-03 (×3): qty 1

## 2017-01-03 MED ORDER — IOPAMIDOL (ISOVUE-300) INJECTION 61%
100.0000 mL | Freq: Once | INTRAVENOUS | Status: AC | PRN
  Administered 2017-01-03: 100 mL via INTRAVENOUS

## 2017-01-03 MED ORDER — LAMOTRIGINE 100 MG PO TABS
100.0000 mg | ORAL_TABLET | Freq: Every day | ORAL | Status: DC
  Administered 2017-01-04 – 2017-01-05 (×2): 100 mg via ORAL
  Filled 2017-01-03 (×3): qty 1

## 2017-01-03 MED ORDER — FLUOXETINE HCL 20 MG PO CAPS
20.0000 mg | ORAL_CAPSULE | Freq: Every day | ORAL | Status: DC
  Administered 2017-01-04 – 2017-01-05 (×2): 20 mg via ORAL
  Filled 2017-01-03 (×3): qty 1

## 2017-01-03 MED ORDER — HYDROMORPHONE HCL 1 MG/ML IJ SOLN
0.5000 mg | Freq: Once | INTRAMUSCULAR | Status: AC
  Administered 2017-01-03: 0.5 mg via INTRAVENOUS
  Filled 2017-01-03: qty 1

## 2017-01-03 MED ORDER — ONDANSETRON HCL 4 MG PO TABS
4.0000 mg | ORAL_TABLET | Freq: Four times a day (QID) | ORAL | Status: DC | PRN
  Administered 2017-01-03: 4 mg via ORAL
  Filled 2017-01-03: qty 1

## 2017-01-03 MED ORDER — ONDANSETRON 4 MG PO TBDP
ORAL_TABLET | ORAL | Status: AC
  Administered 2017-01-03: 4 mg
  Filled 2017-01-03: qty 1

## 2017-01-03 MED ORDER — POTASSIUM CHLORIDE 10 MEQ/100ML IV SOLN
10.0000 meq | INTRAVENOUS | Status: AC
  Administered 2017-01-03 (×3): 10 meq via INTRAVENOUS
  Filled 2017-01-03 (×3): qty 100

## 2017-01-03 MED ORDER — PANTOPRAZOLE SODIUM 40 MG PO TBEC
40.0000 mg | DELAYED_RELEASE_TABLET | Freq: Two times a day (BID) | ORAL | Status: DC
  Filled 2017-01-03: qty 1

## 2017-01-03 MED ORDER — METOCLOPRAMIDE HCL 5 MG/ML IJ SOLN
5.0000 mg | Freq: Once | INTRAMUSCULAR | Status: AC
  Administered 2017-01-03: 5 mg via INTRAVENOUS

## 2017-01-03 MED ORDER — ONDANSETRON HCL 4 MG/2ML IJ SOLN
4.0000 mg | Freq: Once | INTRAMUSCULAR | Status: AC
  Administered 2017-01-03: 4 mg via INTRAVENOUS
  Filled 2017-01-03: qty 2

## 2017-01-03 NOTE — H&P (Signed)
[facility] [place] [facility]   PATIENT NAME: Daisy MangesDanita Pitkin    MR#:  [id number]  DATE OF BIRTH:  1965-11-18  DATE OF ADMISSION:  01/03/2017  PRIMARY CARE PHYSICIAN: Malva LimesFisher, Donald E, MD   REQUESTING/REFERRING PHYSICIAN:   CHIEF COMPLAINT:   Chief Complaint  Patient presents with  . Abdominal Pain  . Emesis  . Diarrhea    HISTORY OF PRESENT ILLNESS: Daisy [name]  is a 51 y.o. female with a known history of Type 2 diabetes mellitus, abdominal hernia, hypertension, migraine, gastroparesis presented to the emergency room with nausea and vomiting for the last few days. Patient also had some diarrhea. The vomitus contained food and water. She has not been anyone to eat and drink fluids because of the nausea. Patient was evaluated in the emergency room with CT abdomen which showed no obstruction. She was started on IV fluids. Patient is due for surgical repair of the ventral hernia in August. In the emergency room patient blood pressure was also elevated. No complaints of any headache, dizziness and blurry vision.  PAST MEDICAL HISTORY:   Past Medical History:  Diagnosis Date  . Diabetes mellitus without complication (HCC)   . Gastritis   . Gastroparesis   . Hernia of abdominal wall   . Hypertension   . Migraine     PAST SURGICAL HISTORY: Past Surgical History:  Procedure Laterality Date  . ABDOMINAL HYSTERECTOMY    . APPENDECTOMY    . APPLICATION OF WOUND VAC    . CHOLECYSTECTOMY    . HERNIA REPAIR    . KNEE SURGERY      SOCIAL HISTORY:  Social History  Substance Use Topics  . Smoking status: Never Smoker  . Smokeless tobacco: Former NeurosurgeonUser    Types: Snuff  . Alcohol use 0.0 oz/week     Comment: only on special occasions- wine    FAMILY HISTORY:  Family History  Problem Relation Age of Onset  . Arthritis Mother   . Depression Mother   . Diabetes Mother   . Heart disease Mother   . Hyperlipidemia Mother   . Hypertension Mother   .  Stroke Mother   . Arthritis Father   . Diabetes Father   . Hearing loss Father   . Heart disease Father   . Hyperlipidemia Father   . Hypertension Father     DRUG ALLERGIES:  Allergies  Allergen Reactions  . Acetaminophen-Codeine Hives  . Codeine Hives  . Ibuprofen Nausea And Vomiting  . Morphine Nausea And Vomiting  . Propoxyphene Other (See Comments)    Reaction:  GI upset   . Sulfa Antibiotics Hives  . Duloxetine Rash and Other (See Comments)    Yellowing of the skin    REVIEW OF SYSTEMS:   CONSTITUTIONAL: No fever, fatigue or weakness.  EYES: No blurred or double vision.  EARS, NOSE, AND THROAT: No tinnitus or ear pain.  RESPIRATORY: No cough, shortness of breath, wheezing or hemoptysis.  CARDIOVASCULAR: No chest pain, orthopnea, edema.  GASTROINTESTINAL: Has nausea, vomiting, diarrhea and abdominal discomfort.  GENITOURINARY: No dysuria, hematuria.  ENDOCRINE: No polyuria, nocturia,  HEMATOLOGY: No anemia, easy bruising or bleeding SKIN: No rash or lesion. MUSCULOSKELETAL: No joint pain or arthritis.   NEUROLOGIC: No tingling, numbness, weakness.  PSYCHIATRY: No anxiety or depression.   MEDICATIONS AT HOME:  Prior to Admission medications   Medication Sig Start Date End Date Taking? Authorizing Provider  albuterol (PROVENTIL HFA;VENTOLIN HFA) 108 (90 Base) MCG/ACT inhaler  Inhale 2 puffs into the lungs every 6 (six) hours as needed for wheezing or shortness of breath. 07/27/16 01/03/17 Yes Malva Limes, MD  alprazolam Prudy Feeler) 2 MG tablet Take 2 mg by mouth 3 (three) times daily as needed for anxiety.    Yes [provider]  amLODipine (NORVASC) 5 MG tablet Take 1 tablet (5 mg total) by mouth daily. 06/18/16  Yes Malva Limes, MD  cholecalciferol (VITAMIN D) 1000 units tablet Take 1,000 Units by mouth daily.   Yes [provider]  cloNIDine (CATAPRES - DOSED IN MG/24 HR) 0.1 mg/24hr patch PLACE 1 PATCH (0.1 MG TOTAL) ONTO THE SKIN ONCE A WEEK.  11/30/16  Yes Malva Limes, MD  FLUoxetine (PROZAC) 20 MG capsule Take 20 mg by mouth daily.   Yes [provider]  furosemide (LASIX) 20 MG tablet Take 1 tablet (20 mg total) by mouth daily as needed for edema. Patient taking differently: Take 20 mg by mouth every other day.  06/18/16  Yes Malva Limes, MD  HYDROcodone-acetaminophen (NORCO) 5-325 MG tablet Take 1 tablet by mouth every 6 (six) hours as needed for severe pain. 12/19/16  Yes Merrily Brittle, MD  lamoTRIgine (LAMICTAL) 100 MG tablet Take 100 mg by mouth daily.   Yes [provider]  lisinopril (PRINIVIL,ZESTRIL) 20 MG tablet Take 1.5 tablets (30 mg total) by mouth daily. Patient taking differently: Take 20 mg by mouth daily.  11/10/16  Yes Malva Limes, MD  metFORMIN (GLUCOPHAGE) 500 MG tablet Take 1 tablet (500 mg total) by mouth daily. 06/18/16  Yes Malva Limes, MD  metoprolol succinate (TOPROL-XL) 25 MG 24 hr tablet Take 2 tablets (50 mg total) by mouth daily. 06/18/16  Yes Malva Limes, MD  ondansetron (ZOFRAN) 4 MG tablet Take 4 mg by mouth every 8 (eight) hours as needed for nausea.   Yes [provider]  Oxycodone HCl 10 MG TABS Take 10 mg by mouth every 4 (four) hours as needed. Take 4-7 tablets daily 05/31/16  Yes [provider]  pantoprazole (PROTONIX) 40 MG tablet Take 1 tablet (40 mg total) by mouth 2 (two) times daily. 11/10/16  Yes Malva Limes, MD  potassium chloride SA (K-DUR,KLOR-CON) 20 MEQ tablet Take 1 tablet (20 mEq total) by mouth daily. Take with furosemide Patient taking differently: Take 20 mEq by mouth every other day. Take with furosemide 06/18/16  Yes Malva Limes, MD  promethazine (PHENERGAN) 12.5 MG tablet Take 1 tablet (12.5 mg total) by mouth every 6 (six) hours as needed for refractory nausea / vomiting. 06/28/16  Yes Malva Limes, MD  sucralfate (CARAFATE) 1 g tablet Take 1 tablet (1 g total) by mouth 4 (four) times daily. 03/26/16  Yes Altamese Dilling, MD  tiZANidine (ZANAFLEX) 4 MG tablet Take 4-12 mg by mouth 3 (three) times daily. 1-3 tablets per day as tolerated   Yes [provider]  traZODone (DESYREL) 100 MG tablet Take 100 mg by mouth at bedtime.  10/12/16  Yes [provider]  vitamin B-12 (CYANOCOBALAMIN) 1000 MCG tablet Take 1,000 mcg by mouth daily.   Yes [provider]  zolpidem (AMBIEN) 10 MG tablet Take 10 mg by mouth at bedtime.   Yes [provider]  ranitidine (ZANTAC) 150 MG tablet Take 1 tablet (150 mg total) by mouth at bedtime. Patient not taking: Reported on 12/19/2016 06/18/16   Malva Limes, MD      PHYSICAL EXAMINATION:  VITAL SIGNS: Blood pressure (!) 205/93, pulse 69, temperature 98.6 F (37 C), temperature source Oral, resp. rate (!) 21, height 5\' 4"  (1.626 m), weight 128.4 kg (283 lb), SpO2 100 %.  GENERAL:  50 y.o.-year-old patient lying in the bed with no acute distress.  EYES: Pupils equal, round, reactive to light and accommodation. No scleral icterus. Extraocular muscles intact.  HEENT: Head atraumatic, normocephalic. Oropharynx dry and nasopharynx clear.  NECK:  Supple, no jugular venous distention. No thyroid enlargement, no tenderness.  LUNGS: Normal breath sounds bilaterally, no wheezing, rales,rhonchi or crepitation. No use of accessory muscles of respiration.  CARDIOVASCULAR: S1, S2 normal. No murmurs, rubs, or gallops.  ABDOMEN: Soft, mild tenderness around umbilicus, nondistended. Bowel sounds present. No organomegaly or mass.  EXTREMITIES: No pedal edema, cyanosis, or clubbing.  NEUROLOGIC: Cranial nerves II through XII are intact. Muscle strength 5/5 in all extremities. Sensation intact. Gait not checked.  PSYCHIATRIC: The patient is alert and oriented x 3.  SKIN: No obvious rash, lesion, or ulcer.   LABORATORY PANEL:   CBC  Recent Labs Lab 01/03/17 0353  WBC 14.8*  HGB 14.0  HCT 42.1  PLT 388  MCV 83.6  MCH 27.9  MCHC 33.4  RDW  16.7*   ------------------------------------------------------------------------------------------------------------------  Chemistries   Recent Labs Lab 01/03/17 0353  NA 140  K 3.2*  CL 104  CO2 23  GLUCOSE 166*  BUN 9  CREATININE 0.83  CALCIUM 9.6  AST 16  ALT 11*  ALKPHOS 47  BILITOT 1.0   ------------------------------------------------------------------------------------------------------------------ estimated creatinine clearance is 110.2 mL/min (by C-G formula based on SCr of 0.83 mg/dL). ------------------------------------------------------------------------------------------------------------------ No results for input(s): TSH, T4TOTAL, T3FREE, THYROIDAB in the last 72 hours.  Invalid input(s): FREET3   Coagulation profile No results for input(s): INR, PROTIME in the last 168 hours. ------------------------------------------------------------------------------------------------------------------- No results for input(s): DDIMER in the last 72 hours. -------------------------------------------------------------------------------------------------------------------  Cardiac Enzymes No results for input(s): CKMB, TROPONINI, MYOGLOBIN in the last 168 hours.  Invalid input(s): CK ------------------------------------------------------------------------------------------------------------------ Invalid input(s): POCBNP  ---------------------------------------------------------------------------------------------------------------  Urinalysis    Component Value Date/Time   COLORURINE STRAW (A) 01/27/2016 0104   APPEARANCEUR CLEAR (A) 01/27/2016 0104   APPEARANCEUR CLEAR 12/10/2013 0745   LABSPEC 1.012 01/27/2016 0104   LABSPEC 1.015 12/10/2013 0745   PHURINE 8.0 01/27/2016 0104   GLUCOSEU NEGATIVE 01/27/2016 0104   GLUCOSEU NEGATIVE 12/10/2013 0745   HGBUR NEGATIVE 01/27/2016 0104   BILIRUBINUR negative 11/12/2016 1207   BILIRUBINUR NEGATIVE 12/10/2013  0745   KETONESUR NEGATIVE 01/27/2016 0104   PROTEINUR negative 11/12/2016 1207   PROTEINUR 30 (A) 01/27/2016 0104   UROBILINOGEN 0.2 11/12/2016 1207   NITRITE negative 11/12/2016 1207   NITRITE NEGATIVE 01/27/2016 0104   LEUKOCYTESUR Trace (A) 11/12/2016 1207   LEUKOCYTESUR NEGATIVE 12/10/2013 0745     RADIOLOGY: Ct Abdomen Pelvis W Contrast  Result Date: 01/03/2017 CLINICAL DATA:  Right upper quadrant pain and nausea EXAM: CT ABDOMEN AND PELVIS WITH CONTRAST TECHNIQUE: Multidetector CT imaging of the abdomen and pelvis was performed using the standard protocol following bolus administration of intravenous contrast. CONTRAST:  ISOVUE-300 IOPAMIDOL (ISOVUE-300) INJECTION 61% COMPARISON:  CT abdomen pelvis 12/19/2016 FINDINGS: Lower chest: No pulmonary nodules or pleural effusion. No visible pericardial effusion. Hepatobiliary: Normal hepatic contours and density. No visible biliary dilatation. Status post cholecystectomy. Pancreas: Normal contours without ductal dilatation. No peripancreatic fluid collection. Spleen: Normal. Adrenals/Urinary Tract: --Adrenal glands: Normal. --Right kidney/ureter: No hydronephrosis or perinephric stranding. No nephrolithiasis. No obstructing ureteral stones. --Left kidney/ureter: No  hydronephrosis or perinephric stranding. No nephrolithiasis. No obstructing ureteral stones. --Urinary bladder: Unremarkable. Stomach/Bowel: --Stomach/Duodenum: Small hiatal hernia. Normal duodenal course and caliber. --Small bowel: There is a slightly lateral ventral abdominal hernia contains a loop of small bowel. There is no evidence of small-bowel obstruction. There is mild adjacent inflammation within the subcutaneous fat, unchanged. --Colon: No focal abnormality. --Appendix: Surgically absent. Vascular/Lymphatic: Normal course and caliber of the major abdominal vessels. No abdominal or pelvic lymphadenopathy. Reproductive: Status post hysterectomy. No adnexal mass.  Musculoskeletal. Multilevel degenerative disc disease and facet arthrosis. No bony spinal canal stenosis. Other: None. IMPRESSION: 1. Ventral abdominal hernia containing a short segment of small bowel without evidence of obstruction. 2. Inflammatory stranding surrounding the hernia sac is unchanged compared to 12/19/2016. Electronically Signed   By: Deatra Robinson M.D.   On: 01/03/2017 06:24    EKG: Orders placed or performed during the hospital encounter of 01/03/17  . EKG 12-Lead  . EKG 12-Lead    IMPRESSION AND PLAN: 51 year old female patient with history of hypertension, diabetes mellitus type 2, ventral hernia, gastroparesis presented to emergency room with abdominal discomfort, nausea, vomiting and diarrhea. Admitting diagnosis. 1. Diabetic gastroparesis 2. Hypokalemia 3. Uncontrolled hypertension 4. Type 2 diabetes mellitus 5. Ventral hernia Treatment plan Admit patient to medical floor IV fluid hydration Potassium supplementation Clear liquid diet Control nausea and vomiting Control blood pressure  All the records are reviewed and case discussed with ED provider. Management plans discussed with the patient, family and they are in agreement.  CODE STATUS:FULL CODE Code Status History    Date Active Date Inactive Code Status Order ID Comments User Context   03/23/2016  6:29 AM 03/26/2016  4:51 PM Full Code [id number]  Arnaldo Natal, MD Inpatient   09/28/2015  4:54 AM 09/29/2015  5:20 PM Full Code [id number]  Ihor Austin, MD ED   05/05/2015  8:36 PM 05/09/2015  8:06 PM Full Code [id number]  Hower, Cletis [place], MD ED   01/07/2015  7:11 PM 01/09/2015  6:55 PM Full Code [id number]  Milagros Loll, MD ED       TOTAL TIME TAKING CARE OF THIS PATIENT: 51 minutes.    Ihor Austin M.D on 01/03/2017 at 7:04 AM  Between 7am to 6pm - Pager - [phone]  After 6pm go to [email or web address] - password [email or web address] [facility]  [facility]  Office  [phone]  CC: Primary care  physician; Malva Limes, MD

## 2017-01-03 NOTE — ED Provider Notes (Signed)
[facility] Emergency Department Provider Note   ____________________________________________   First MD Initiated Contact with Patient 01/03/17 0410     (approximate)  I have reviewed the triage vital signs and the nursing notes.   HISTORY  Chief Complaint Abdominal Pain; Emesis; and Diarrhea    HPI Daisy [name] is a 51 y.o. female brought to the ED from home via EMS with a chief complaint of abdominal pain, nausea, vomiting and diarrhea. Patient reports onset of symptoms yesterday. Initially with diarrhea 3, nausea/vomiting 2. Complains of generalized abdominal pain. Patient has a known large ventral hernia and is scheduled for surgery at [facility] in August. Has had prior abdominal surgeries without a history of SBO. Denies associated fever, chills, chest pain, shortness of breath, dysuria. Denies recent travel or trauma. Nothing makes her symptoms better or worse.   Past Medical History:  Diagnosis Date  . Diabetes mellitus without complication (HCC)   . Gastritis   . Gastroparesis   . Hernia of abdominal wall   . Hypertension   . Migraine     Patient Active Problem List   Diagnosis Date Noted  . Hyperlipidemia 06/18/2016  . Controlled type 2 diabetes mellitus without complication, without long-term current use of insulin (HCC) 06/18/2016  . Bipolar 2 disorder (HCC) 06/18/2016  . Depression 06/18/2016  . GERD (gastroesophageal reflux disease) 06/18/2016  . Hypertension 06/18/2016  . Ingrown toenail 06/18/2016  . Hypokalemia 09/28/2015  . Chronic abdominal pain 05/23/2015  . Complex ovarian cyst 05/23/2015  . Anxiety 03/27/2015  . Insomnia 03/27/2015  . Gastritis 01/07/2015  . DDD (degenerative disc disease), lumbar 11/28/2014  . DJD (degenerative joint disease) of knee 11/28/2014  . Migraine headache 11/28/2014  . Bilateral occipital neuralgia 11/28/2014  . Morbid obesity (HCC) 11/28/2014  . Neuropathy due to secondary diabetes (HCC)  11/28/2014  . Abdominal wall abscess 09/20/2014  . Back pain, chronic 09/20/2014  . Incisional hernia, without obstruction or gangrene 04/30/2014  . Bilateral chronic knee pain 03/04/2014    Past Surgical History:  Procedure Laterality Date  . ABDOMINAL HYSTERECTOMY    . APPENDECTOMY    . APPLICATION OF WOUND VAC    . CHOLECYSTECTOMY    . HERNIA REPAIR    . KNEE SURGERY      Prior to Admission medications   Medication Sig Start Date End Date Taking? Authorizing Provider  albuterol (PROVENTIL HFA;VENTOLIN HFA) 108 (90 Base) MCG/ACT inhaler Inhale 2 puffs into the lungs every 6 (six) hours as needed for wheezing or shortness of breath. 07/27/16 12/19/16  Malva Limes, MD  alprazolam Prudy Feeler) 2 MG tablet Take 2 mg by mouth 3 (three) times daily as needed for anxiety.     [provider]  amLODipine (NORVASC) 5 MG tablet Take 1 tablet (5 mg total) by mouth daily. 06/18/16   Malva Limes, MD  cholecalciferol (VITAMIN D) 1000 units tablet Take 1,000 Units by mouth daily.    [provider]  cloNIDine (CATAPRES - DOSED IN MG/24 HR) 0.1 mg/24hr patch PLACE 1 PATCH (0.1 MG TOTAL) ONTO THE SKIN ONCE A WEEK. 11/30/16   Malva Limes, MD  FLUoxetine (PROZAC) 20 MG capsule Take 20 mg by mouth daily.    [provider]  furosemide (LASIX) 20 MG tablet Take 1 tablet (20 mg total) by mouth daily as needed for edema. Patient taking differently: Take 20 mg by mouth every other day.  06/18/16   Malva Limes, MD  HYDROcodone-acetaminophen [facility])  5-325 MG tablet Take 1 tablet by mouth every 6 (six) hours as needed for severe pain. 12/19/16   Merrily Brittle, MD  lamoTRIgine (LAMICTAL) 100 MG tablet Take 100 mg by mouth daily.    [provider]  lisinopril (PRINIVIL,ZESTRIL) 20 MG tablet Take 1.5 tablets (30 mg total) by mouth daily. Patient taking differently: Take 20 mg by mouth daily.  11/10/16   Malva Limes, MD  metFORMIN (GLUCOPHAGE) 500 MG tablet Take 1  tablet (500 mg total) by mouth daily. 06/18/16   Malva Limes, MD  metoprolol succinate (TOPROL-XL) 25 MG 24 hr tablet Take 2 tablets (50 mg total) by mouth daily. 06/18/16   Malva Limes, MD  ondansetron (ZOFRAN) 4 MG tablet Take 4 mg by mouth every 8 (eight) hours as needed for nausea.    [provider]  Oxycodone HCl 10 MG TABS Take 10 mg by mouth every 4 (four) hours as needed. Take 4-7 tablets daily 05/31/16   [provider]  pantoprazole (PROTONIX) 40 MG tablet Take 1 tablet (40 mg total) by mouth 2 (two) times daily. 11/10/16   Malva Limes, MD  potassium chloride SA (K-DUR,KLOR-CON) 20 MEQ tablet Take 1 tablet (20 mEq total) by mouth daily. Take with furosemide Patient taking differently: Take 20 mEq by mouth every other day. Take with furosemide 06/18/16   Malva Limes, MD  promethazine (PHENERGAN) 12.5 MG tablet Take 1 tablet (12.5 mg total) by mouth every 6 (six) hours as needed for refractory nausea / vomiting. 06/28/16   Malva Limes, MD  ranitidine (ZANTAC) 150 MG tablet Take 1 tablet (150 mg total) by mouth at bedtime. Patient not taking: Reported on 12/19/2016 06/18/16   Malva Limes, MD  sucralfate (CARAFATE) 1 g tablet Take 1 tablet (1 g total) by mouth 4 (four) times daily. 03/26/16   Altamese Dilling, MD  tiZANidine (ZANAFLEX) 4 MG tablet Take 4-12 mg by mouth 3 (three) times daily. 1-3 tablets per day as tolerated    [provider]  traZODone (DESYREL) 100 MG tablet Take 100 mg by mouth at bedtime.  10/12/16   [provider]  vitamin B-12 (CYANOCOBALAMIN) 1000 MCG tablet Take 1,000 mcg by mouth daily.    [provider]  zolpidem (AMBIEN) 10 MG tablet Take 10 mg by mouth at bedtime.    [provider]    Allergies Acetaminophen-codeine; Codeine; Ibuprofen; Morphine; Propoxyphene; Sulfa antibiotics; and Duloxetine  Family History  Problem Relation Age of Onset  . Arthritis Mother   . Depression Mother    . Diabetes Mother   . Heart disease Mother   . Hyperlipidemia Mother   . Hypertension Mother   . Stroke Mother   . Arthritis Father   . Diabetes Father   . Hearing loss Father   . Heart disease Father   . Hyperlipidemia Father   . Hypertension Father     Social History Social History  Substance Use Topics  . Smoking status: Never Smoker  . Smokeless tobacco: Former Neurosurgeon    Types: Snuff  . Alcohol use 0.0 oz/week     Comment: only on special occasions- wine    Review of Systems  Constitutional: No fever/chills. Eyes: No visual changes. ENT: No sore throat. Cardiovascular: Denies chest pain. Respiratory: Denies shortness of breath. Gastrointestinal: Positive for abdominal pain, nausea, vomiting and diarrhea.  No constipation. Genitourinary: Negative for dysuria. Musculoskeletal: Negative for back pain. Skin: Negative for rash. Neurological: Negative for headaches,  focal weakness or numbness.   ____________________________________________   PHYSICAL EXAM:  VITAL SIGNS: ED Triage Vitals  Enc Vitals Group     BP 01/03/17 0316 (!) 188/123     Pulse Rate 01/03/17 0314 91     Resp 01/03/17 0314 20     Temp 01/03/17 0315 98.6 F (37 C)     Temp Source 01/03/17 0315 Oral     SpO2 01/03/17 0314 96 %     Weight 01/03/17 0315 283 lb (128.4 kg)     Height 01/03/17 0315 5\' 4"  (1.626 m)     Head Circumference --      Peak Flow --      Pain Score --      Pain Loc --      Pain Edu? --      Excl. in GC? --     Constitutional: Alert and oriented. Uncomfortable appearing and in moderate acute distress. Eyes: Conjunctivae are normal. PERRL. EOMI. Head: Atraumatic. Nose: No congestion/rhinnorhea. Mouth/Throat: Mucous membranes are moist.  Oropharynx non-erythematous. Neck: No stridor.   Cardiovascular: Normal rate, regular rhythm. Grossly normal heart sounds.  Good peripheral circulation. Respiratory: Normal respiratory effort.  No retractions. Lungs  CTAB. Gastrointestinal: Obese. Soft and diffusely tender to palpation without rebound or guarding. No distention. No abdominal bruits. No CVA tenderness. Musculoskeletal: No lower extremity tenderness nor edema.  No joint effusions. Neurologic:  Normal speech and language. No gross focal neurologic deficits are appreciated.  Skin:  Skin is warm, dry and intact. No rash noted. Psychiatric: Mood and affect are normal. Speech and behavior are normal.  ____________________________________________   LABS (all labs ordered are listed, but only abnormal results are displayed)  Labs Reviewed  COMPREHENSIVE METABOLIC PANEL - Abnormal; Notable for the following:       Result Value   Potassium 3.2 (*)    Glucose, Bld 166 (*)    Total Protein 8.9 (*)    ALT 11 (*)    All other components within normal limits  CBC - Abnormal; Notable for the following:    WBC 14.8 (*)    RDW 16.7 (*)    All other components within normal limits  LIPASE, BLOOD  LACTIC ACID, PLASMA  URINALYSIS, COMPLETE (UACMP) WITH MICROSCOPIC  LACTIC ACID, PLASMA   ____________________________________________  EKG  ED ECG REPORT I, Isis Costanza J, the attending physician, personally viewed and interpreted this ECG.   Date: 01/03/2017  EKG Time: 0322  Rate: 77  Rhythm: normal EKG, normal sinus rhythm  Axis: Normal  Intervals:none  ST&T Change: Nonspecific  ____________________________________________  RADIOLOGY  Ct Abdomen Pelvis W Contrast  Result Date: 01/03/2017 CLINICAL DATA:  Right upper quadrant pain and nausea EXAM: CT ABDOMEN AND PELVIS WITH CONTRAST TECHNIQUE: Multidetector CT imaging of the abdomen and pelvis was performed using the standard protocol following bolus administration of intravenous contrast. CONTRAST:  ISOVUE-300 IOPAMIDOL (ISOVUE-300) INJECTION 61% COMPARISON:  CT abdomen pelvis 12/19/2016 FINDINGS: Lower chest: No pulmonary nodules or pleural effusion. No visible pericardial effusion.  Hepatobiliary: Normal hepatic contours and density. No visible biliary dilatation. Status post cholecystectomy. Pancreas: Normal contours without ductal dilatation. No peripancreatic fluid collection. Spleen: Normal. Adrenals/Urinary Tract: --Adrenal glands: Normal. --Right kidney/ureter: No hydronephrosis or perinephric stranding. No nephrolithiasis. No obstructing ureteral stones. --Left kidney/ureter: No hydronephrosis or perinephric stranding. No nephrolithiasis. No obstructing ureteral stones. --Urinary bladder: Unremarkable. Stomach/Bowel: --Stomach/Duodenum: Small hiatal hernia. Normal duodenal course and caliber. --Small bowel: There is a slightly lateral ventral abdominal hernia contains a  loop of small bowel. There is no evidence of small-bowel obstruction. There is mild adjacent inflammation within the subcutaneous fat, unchanged. --Colon: No focal abnormality. --Appendix: Surgically absent. Vascular/Lymphatic: Normal course and caliber of the major abdominal vessels. No abdominal or pelvic lymphadenopathy. Reproductive: Status post hysterectomy. No adnexal mass. Musculoskeletal. Multilevel degenerative disc disease and facet arthrosis. No bony spinal canal stenosis. Other: None. IMPRESSION: 1. Ventral abdominal hernia containing a short segment of small bowel without evidence of obstruction. 2. Inflammatory stranding surrounding the hernia sac is unchanged compared to 12/19/2016. Electronically Signed   By: Deatra RobinsonKevin  Herman M.D.   On: 01/03/2017 06:24    ____________________________________________   PROCEDURES  Procedure(s) performed: None  Procedures  Critical Care performed: No  ____________________________________________   INITIAL IMPRESSION / ASSESSMENT AND PLAN / ED COURSE  Pertinent labs & imaging results that were available during my care of the patient were reviewed by me and considered in my medical decision making (see chart for details).  51 year old female with abdominal  pain, nausea/vomiting/diarrhea, known large ventral hernia and prior abdominal surgeries. Will obtain screening lab work, initiate IV fluid resuscitation, IV analgesia with antiemetic and proceed with CT abdomen/pelvis.  Clinical Course as of Jan 04 635  Mon Jan 03, 2017  [id number] Phenergan was given for persistent nausea/small amount of vomiting.  [JS]  0550 Patient continues to vomit despite Phenergan. Has a history of gastroparesis; will trial Reglan.  [JS]  [id number] Updated patient of CT results. Currently vomiting. Will re-dose Reglan and Dilaudid; discuss with hospitalist evaluate patient in the department for admission.  [JS]    Clinical Course User Index [JS] Irean HongSung, Lorin Hauck J, MD     ____________________________________________   FINAL CLINICAL IMPRESSION(S) / ED DIAGNOSES  Final diagnoses:  Nausea vomiting and diarrhea  Generalized abdominal pain  Gastroparesis  Intractable vomiting with nausea, unspecified vomiting type      NEW MEDICATIONS STARTED DURING THIS VISIT:  New Prescriptions   No medications on file     Note:  This document was prepared using Dragon voice recognition software and may include unintentional dictation errors.    Irean HongSung, Klarisa Barman J, MD 01/03/17 [phone]

## 2017-01-03 NOTE — ED Notes (Signed)
Patient transported to CT 

## 2017-01-03 NOTE — Care Management Important Message (Deleted)
Important Message  Patient Details  Name: Daisy [name] MRN: [id number] Date of Birth: 1966/01/31   Medicare Important Message Given:  Yes    Chapman FitchBOWEN, Emi Lymon T, RN 01/03/2017, 4:41 PM

## 2017-01-03 NOTE — Progress Notes (Signed)
Patient is refusing bed alarm.  Risks explained in detail to patient and understanding verbalized.

## 2017-01-03 NOTE — ED Triage Notes (Signed)
Patient brought in by ems from home. Patient with complaint of diarrhea and right upper abdominal pain that started last night and vomiting that started today.

## 2017-01-03 NOTE — Progress Notes (Signed)
Admitted for abdominal pain, nausea, vomiting, diabetic gastroparesis. No diarrhea. Abdominal pain mainly in the epigastric area. Still has a lot of nausea. Labs, medications reviewed  Vitals 'blood pressure this morning 195/116. Heart rate 94.  Assessment and plan. Malignant hypertension: Continue meropenem, lisinopril, metoprolol XL 50 MG daily, add hydralazine 50 mg 3 times a day to help with bp/.    #2. diabetic gastroparesis causing nausea, vomiting: Continue Reglan 5 mg IV every 6 hours when necessary, continue IV fluids, clear liquids and advance as tolerated. Continues SSI with coverage for diabetes.  Time spent;25 min

## 2017-01-03 NOTE — ED Notes (Signed)
Patient continues to vomit. Dr. Drinda Buttsung notified. New orders received for Reglan.

## 2017-01-03 NOTE — ED Notes (Signed)
Patient continues to vomit. Dr. Dolores FrameSung notified. New orders received for phenergan.

## 2017-01-03 NOTE — Progress Notes (Signed)
Anticoagulation monitoring(Lovenox):  50yo  F ordered Lovenox 40 mg Q24h  Filed Weights   01/03/17 0315 01/03/17 0819  Weight: 283 lb (128.4 kg) 276 lb (125.2 kg)   BMI 47.5   Lab Results  Component Value Date   CREATININE 0.83 01/03/2017   CREATININE 0.78 12/19/2016   CREATININE 0.66 09/14/2016   Estimated Creatinine Clearance: 108.5 mL/min (by C-G formula based on SCr of 0.83 mg/dL). Hemoglobin & Hematocrit     Component Value Date/Time   HGB 14.0 01/03/2017 0353   HGB 12.6 04/04/2014 1457   HCT 42.1 01/03/2017 0353   HCT 40.2 04/04/2014 1457     Per Protocol for Patient with estCrcl > 30 ml/min and BMI > 40, will transition to Lovenox 40 mg Q12h.     Bari MantisKristin Elizabethanne Lusher PharmD Clinical Pharmacist 01/03/2017

## 2017-01-04 DIAGNOSIS — K3184 Gastroparesis: Secondary | ICD-10-CM | POA: Diagnosis not present

## 2017-01-04 DIAGNOSIS — E1143 Type 2 diabetes mellitus with diabetic autonomic (poly)neuropathy: Secondary | ICD-10-CM | POA: Diagnosis not present

## 2017-01-04 DIAGNOSIS — E876 Hypokalemia: Secondary | ICD-10-CM | POA: Diagnosis not present

## 2017-01-04 DIAGNOSIS — I10 Essential (primary) hypertension: Secondary | ICD-10-CM | POA: Diagnosis not present

## 2017-01-04 DIAGNOSIS — E119 Type 2 diabetes mellitus without complications: Secondary | ICD-10-CM | POA: Diagnosis not present

## 2017-01-04 LAB — BASIC METABOLIC PANEL
ANION GAP: 10 (ref 5–15)
BUN: 10 mg/dL (ref 6–20)
CHLORIDE: 100 mmol/L — AB (ref 101–111)
CO2: 27 mmol/L (ref 22–32)
CREATININE: 0.91 mg/dL (ref 0.44–1.00)
Calcium: 8.4 mg/dL — ABNORMAL LOW (ref 8.9–10.3)
GFR calc non Af Amer: >60 mL/min (ref >60)
Glucose, Bld: 117 mg/dL — ABNORMAL HIGH (ref 65–99)
Potassium: 3.5 mmol/L (ref 3.5–5.1)
SODIUM: 137 mmol/L (ref 135–145)

## 2017-01-04 LAB — CBC
HCT: 36 % (ref 35.0–47.0)
Hemoglobin: 12 g/dL (ref 12.0–16.0)
MCH: 28.3 pg (ref 26.0–34.0)
MCHC: 33.3 g/dL (ref 32.0–36.0)
MCV: 84.9 fL (ref 80.0–100.0)
PLATELETS: 349 10*3/uL (ref 150–440)
RBC: 4.24 MIL/uL (ref 3.80–5.20)
RDW: 16.7 % — ABNORMAL HIGH (ref 11.5–14.5)
WBC: 8.9 10*3/uL (ref 3.6–11.0)

## 2017-01-04 LAB — GLUCOSE, CAPILLARY
GLUCOSE-CAPILLARY: 127 mg/dL — AB (ref 65–99)
GLUCOSE-CAPILLARY: 91 mg/dL (ref 65–99)
GLUCOSE-CAPILLARY: 95 mg/dL (ref 65–99)
Glucose-Capillary: 135 mg/dL — ABNORMAL HIGH (ref 65–99)

## 2017-01-04 LAB — HIV ANTIBODY (ROUTINE TESTING W REFLEX): HIV SCREEN 4TH GENERATION: NONREACTIVE

## 2017-01-04 MED ORDER — ALBUTEROL SULFATE HFA 108 (90 BASE) MCG/ACT IN AERS: 1.0000 | INHALATION_SPRAY | Freq: Four times a day (QID) | RESPIRATORY_TRACT | Status: DC | PRN

## 2017-01-04 MED ORDER — HYDROCODONE-ACETAMINOPHEN 5-325 MG PO TABS
1.0000 | ORAL_TABLET | ORAL | Status: DC | PRN
  Administered 2017-01-04 – 2017-01-05 (×3): 1 via ORAL
  Filled 2017-01-04 (×3): qty 1

## 2017-01-04 MED ORDER — TIZANIDINE HCL 4 MG PO TABS
4.0000 mg | ORAL_TABLET | Freq: Every day | ORAL | Status: DC
  Administered 2017-01-04: 4 mg via ORAL
  Filled 2017-01-04 (×2): qty 1

## 2017-01-04 MED ORDER — METOCLOPRAMIDE HCL 5 MG PO TABS
5.0000 mg | ORAL_TABLET | Freq: Three times a day (TID) | ORAL | Status: DC
  Administered 2017-01-04 – 2017-01-05 (×4): 5 mg via ORAL
  Filled 2017-01-04 (×4): qty 1

## 2017-01-04 NOTE — Care Management Obs Status (Signed)
MEDICARE OBSERVATION STATUS NOTIFICATION   Patient Details  Name: Daisy GoslingDanita A Vantassell MRN: [id number] Date of Birth: 28-Aug-1965   Medicare Observation Status Notification Given:  Yes    Chapman FitchBOWEN, Briauna Gilmartin T, RN 01/04/2017, 10:22 AM

## 2017-01-04 NOTE — Progress Notes (Signed)
[facility] [place] [facility]   PATIENT NAME: Daisy [name]    MR#:  [id number]  DATE OF BIRTH:  Nov 01, 1965  SUBJECTIVE: Admitted for nausea, vomiting, diarrhea, abdominal pain. Patient abdominal pain still persisting but no nausea no nausea vomiting or diarrhea. This morning blood pressure was low. Complains of generalized abdominal pain.   CHIEF COMPLAINT:   Chief Complaint  Patient presents with  . Abdominal Pain  . Emesis  . Diarrhea    REVIEW OF SYSTEMS:   ROS CONSTITUTIONAL: No fever, fatigue or weakness.  EYES: No blurred or double vision.  EARS, NOSE, AND THROAT: No tinnitus or ear pain.  RESPIRATORY: No cough, shortness of breath, wheezing or hemoptysis.  CARDIOVASCULAR: No chest pain, orthopnea, edema.  GASTROINTESTINAL: No nausea, vomiting, diarrhea . Has generalized abdominal pain. GENITOURINARY: No dysuria, hematuria.  ENDOCRINE: No polyuria, nocturia,  HEMATOLOGY: No anemia, easy bruising or bleeding SKIN: No rash or lesion. MUSCULOSKELETAL: No joint pain or arthritis.   NEUROLOGIC: No tingling, numbness, weakness.  PSYCHIATRY: No anxiety or depression.   DRUG ALLERGIES:   Allergies  Allergen Reactions  . Acetaminophen-Codeine Hives  . Codeine Hives  . Ibuprofen Nausea And Vomiting  . Morphine Nausea And Vomiting  . Propoxyphene Other (See Comments)    Reaction:  GI upset   . Sulfa Antibiotics Hives  . Duloxetine Rash and Other (See Comments)    Yellowing of the skin    VITALS:  Blood pressure 99/62, pulse 99, temperature 98 F (36.7 C), temperature source Oral, resp. rate 16, height 5\' 4"  (1.626 m), weight 125.2 kg (276 lb), SpO2 94 %.  PHYSICAL EXAMINATION:  GENERAL:  51 y.o.-year-old patient lying in the bed with no acute distress.  EYES: Pupils equal, round, reactive to light and accommodation. No scleral icterus. Extraocular muscles intact.  HEENT: Head atraumatic, normocephalic. Oropharynx and nasopharynx clear.   NECK:  Supple, no jugular venous distention. No thyroid enlargement, no tenderness.  LUNGS: Normal breath sounds bilaterally, no wheezing, rales,rhonchi or crepitation. No use of accessory muscles of respiration.  CARDIOVASCULAR: S1, S2 normal. No murmurs, rubs, or gallops.  ABDOMEN: Under to palpation in the right and left lower quadrants. No rebound tenderness, bowel sounds present, no organomegaly.  EXTREMITIES: No pedal edema, cyanosis, or clubbing.  NEUROLOGIC: Cranial nerves II through XII are intact. Muscle strength 5/5 in all extremities. Sensation intact. Gait not checked.  PSYCHIATRIC: The patient is alert and oriented x 3.  SKIN: No obvious rash, lesion, or ulcer.    LABORATORY PANEL:   CBC  Recent Labs Lab 01/04/17 0451  WBC 8.9  HGB 12.0  HCT 36.0  PLT 349   ------------------------------------------------------------------------------------------------------------------  Chemistries   Recent Labs Lab 01/03/17 0353 01/03/17 0923 01/04/17 0451  NA 140  --  137  K 3.2*  --  3.5  CL 104  --  100*  CO2 23  --  27  GLUCOSE 166*  --  117*  BUN 9  --  10  CREATININE 0.83  --  0.91  CALCIUM 9.6  --  8.4*  MG  --  1.4*  --   AST 16  --   --   ALT 11*  --   --   ALKPHOS 47  --   --   BILITOT 1.0  --   --    ------------------------------------------------------------------------------------------------------------------  Cardiac Enzymes No results for input(s): TROPONINI in the last 168 hours. ------------------------------------------------------------------------------------------------------------------  RADIOLOGY:  Ct Abdomen Pelvis W Contrast  Result Date: 01/03/2017 CLINICAL DATA:  Right upper quadrant pain and nausea EXAM: CT ABDOMEN AND PELVIS WITH CONTRAST TECHNIQUE: Multidetector CT imaging of the abdomen and pelvis was performed using the standard protocol following bolus administration of intravenous contrast. CONTRAST:  100mL ISOVUE-300  IOPAMIDOL (ISOVUE-300) INJECTION 61% COMPARISON:  CT abdomen pelvis 12/19/2016 FINDINGS: Lower chest: No pulmonary nodules or pleural effusion. No visible pericardial effusion. Hepatobiliary: Normal hepatic contours and density. No visible biliary dilatation. Status post cholecystectomy. Pancreas: Normal contours without ductal dilatation. No peripancreatic fluid collection. Spleen: Normal. Adrenals/Urinary Tract: --Adrenal glands: Normal. --Right kidney/ureter: No hydronephrosis or perinephric stranding. No nephrolithiasis. No obstructing ureteral stones. --Left kidney/ureter: No hydronephrosis or perinephric stranding. No nephrolithiasis. No obstructing ureteral stones. --Urinary bladder: Unremarkable. Stomach/Bowel: --Stomach/Duodenum: Small hiatal hernia. Normal duodenal course and caliber. --Small bowel: There is a slightly lateral ventral abdominal hernia contains a loop of small bowel. There is no evidence of small-bowel obstruction. There is mild adjacent inflammation within the subcutaneous fat, unchanged. --Colon: No focal abnormality. --Appendix: Surgically absent. Vascular/Lymphatic: Normal course and caliber of the major abdominal vessels. No abdominal or pelvic lymphadenopathy. Reproductive: Status post hysterectomy. No adnexal mass. Musculoskeletal. Multilevel degenerative disc disease and facet arthrosis. No bony spinal canal stenosis. Other: None. IMPRESSION: 1. Ventral abdominal hernia containing a short segment of small bowel without evidence of obstruction. 2. Inflammatory stranding surrounding the hernia sac is unchanged compared to 12/19/2016. Electronically Signed   By: Deatra RobinsonKevin  Herman M.D.   On: 01/03/2017 06:24    EKG:   Orders placed or performed during the hospital encounter of 01/03/17  . EKG 12-Lead  . EKG 12-Lead    ASSESSMENT AND PLAN:   #1. abdominal pain without any evidence of  obstruction likely due to acute gastritis, musculoskeletal. Patient told me that she lifted lot  of heavy boxes last week. BP place, pain medicines, possible discharge tomorrow morning. #2 .hypotension likely secondary to too many hypertensive agents. Discontinue hydralazine. Patient was having malignant hypertension yesterday and day before. Right now blood pressure is on the lower side, , possible discharge tomorrow morning. #3 diabetes mellitus type 2: With positive epigastric nurses: Continue Reglan but change it to by mouth today. And she is tolerating soft diet. #4 .depression: Continue Lamictal, Prozac/ D/w RN  All the records are reviewed and case discussed with Care Management/Social Workerr. Management plans discussed with the patient, family and they are in agreement.  CODE STATUS:full TOTAL TIME TAKING CARE OF THIS PATIENT: 35 minutes.   POSSIBLE D/C IN 1-2 DAYS, DEPENDING ON CLINICAL CONDITION.   Katha HammingKONIDENA,Kolbe Delmonaco M.D on 01/04/2017 at 11:10 AM  Between 7am to 6pm - Pager - [phone]  After 6pm go to [email or web address] - password [email or web address]  Fabio Neighborsagle [place] [facility]  Office  [phone]  CC: Primary care physician; Malva LimesFisher, Donald E, MD   Note: This dictation was prepared with Dragon dictation along with smaller phrase technology. Any transcriptional errors that result from this process are unintentional.

## 2017-01-05 DIAGNOSIS — E119 Type 2 diabetes mellitus without complications: Secondary | ICD-10-CM | POA: Diagnosis not present

## 2017-01-05 DIAGNOSIS — K3184 Gastroparesis: Secondary | ICD-10-CM | POA: Diagnosis not present

## 2017-01-05 DIAGNOSIS — E1143 Type 2 diabetes mellitus with diabetic autonomic (poly)neuropathy: Secondary | ICD-10-CM | POA: Diagnosis not present

## 2017-01-05 DIAGNOSIS — I10 Essential (primary) hypertension: Secondary | ICD-10-CM | POA: Diagnosis not present

## 2017-01-05 DIAGNOSIS — E876 Hypokalemia: Secondary | ICD-10-CM | POA: Diagnosis not present

## 2017-01-05 LAB — GLUCOSE, CAPILLARY
GLUCOSE-CAPILLARY: 79 mg/dL (ref 65–99)
GLUCOSE-CAPILLARY: 114 mg/dL — AB (ref 65–99)

## 2017-01-05 MED ORDER — OXYCODONE-ACETAMINOPHEN 5-325 MG PO TABS
1.0000 | ORAL_TABLET | Freq: Three times a day (TID) | ORAL | Status: DC | PRN
  Administered 2017-01-05: 1 via ORAL
  Filled 2017-01-05: qty 1

## 2017-01-05 NOTE — Progress Notes (Signed)
01/05/2017  12:55 PM  Daisy [name] to be D/C'd Home per MD order.  Discussed prescriptions and follow up appointments with the patient. Prescriptions given to patient, medication list explained in detail. Pt verbalized understanding.  Allergies as of 01/05/2017      Reactions   Acetaminophen-codeine Hives   Codeine Hives   Ibuprofen Nausea And Vomiting   Morphine Nausea And Vomiting   Propoxyphene Other (See Comments)   Reaction:  GI upset    Sulfa Antibiotics Hives   Duloxetine Rash, Other (See Comments)   Yellowing of the skin      Medication List    STOP taking these medications   HYDROcodone-acetaminophen 5-325 MG tablet Commonly known as:  NORCO   ranitidine 150 MG tablet Commonly known as:  ZANTAC     TAKE these medications   albuterol 108 (90 Base) MCG/ACT inhaler Commonly known as:  PROVENTIL HFA;VENTOLIN HFA Inhale 2 puffs into the lungs every 6 (six) hours as needed for wheezing or shortness of breath.   alprazolam 2 MG tablet Commonly known as:  XANAX Take 2 mg by mouth 3 (three) times daily as needed for anxiety.   amLODipine 5 MG tablet Commonly known as:  NORVASC Take 1 tablet (5 mg total) by mouth daily.   cholecalciferol 1000 units tablet Commonly known as:  VITAMIN D Take 1,000 Units by mouth daily.   cloNIDine 0.1 mg/24hr patch Commonly known as:  CATAPRES - Dosed in mg/24 hr PLACE 1 PATCH (0.1 MG TOTAL) ONTO THE SKIN ONCE A WEEK.   FLUoxetine 20 MG capsule Commonly known as:  PROZAC Take 20 mg by mouth daily.   furosemide 20 MG tablet Commonly known as:  LASIX Take 1 tablet (20 mg total) by mouth daily as needed for edema. What changed:  when to take this   lamoTRIgine 100 MG tablet Commonly known as:  LAMICTAL Take 100 mg by mouth daily.   lisinopril 20 MG tablet Commonly known as:  PRINIVIL,ZESTRIL Take 1.5 tablets (30 mg total) by mouth daily. What changed:  how much to take   metFORMIN 500 MG tablet Commonly known as:   GLUCOPHAGE Take 1 tablet (500 mg total) by mouth daily.   metoprolol succinate 25 MG 24 hr tablet Commonly known as:  TOPROL-XL Take 2 tablets (50 mg total) by mouth daily.   ondansetron 4 MG tablet Commonly known as:  ZOFRAN Take 4 mg by mouth every 8 (eight) hours as needed for nausea.   Oxycodone HCl 10 MG Tabs Take 10 mg by mouth every 4 (four) hours as needed. Take 4-7 tablets daily   pantoprazole 40 MG tablet Commonly known as:  PROTONIX Take 1 tablet (40 mg total) by mouth 2 (two) times daily.   potassium chloride SA 20 MEQ tablet Commonly known as:  K-DUR,KLOR-CON Take 1 tablet (20 mEq total) by mouth daily. Take with furosemide What changed:  when to take this  additional instructions   promethazine 12.5 MG tablet Commonly known as:  PHENERGAN Take 1 tablet (12.5 mg total) by mouth every 6 (six) hours as needed for refractory nausea / vomiting.   sucralfate 1 g tablet Commonly known as:  CARAFATE Take 1 tablet (1 g total) by mouth 4 (four) times daily.   tiZANidine 4 MG tablet Commonly known as:  ZANAFLEX Take 4-12 mg by mouth 3 (three) times daily. 1-3 tablets per day as tolerated   traZODone 100 MG tablet Commonly known as:  DESYREL Take 100 mg by mouth  at bedtime.   vitamin B-12 1000 MCG tablet Commonly known as:  CYANOCOBALAMIN Take 1,000 mcg by mouth daily.   zolpidem 10 MG tablet Commonly known as:  AMBIEN Take 10 mg by mouth at bedtime.       Vitals:   01/04/17 2023 01/05/17 0416  BP: (!) 118/56 101/84  Pulse: 73 80  Resp: 18 18  Temp: 99 F (37.2 C) 98 F (36.7 C)    Skin clean, dry and intact without evidence of skin break down, no evidence of skin tears noted. IV catheter discontinued intact. Site without signs and symptoms of complications. Dressing and pressure applied. Pt denies pain at this time. No complaints noted.  An After Visit Summary was printed and given to the patient. Patient escorted via WC, and D/C home via private  auto.  Bradly Chrisougherty, Daisy [name]

## 2017-01-05 NOTE — Discharge Instructions (Addendum)
[facility]

## 2017-01-06 NOTE — Discharge Summary (Signed)
[facility]   PATIENT NAME: Daisy [name]    MR#:  [id number]  DATE OF BIRTH:  27-Dec-1965  DATE OF ADMISSION:  01/03/2017 ADMITTING PHYSICIAN: Ihor AustinPavan Pyreddy, MD  DATE OF DISCHARGE: 01/05/2017 12:55 PM  PRIMARY CARE PHYSICIAN: Malva LimesFisher, Donald E, MD   ADMISSION DIAGNOSIS:  Gastroparesis [K31.84] Generalized abdominal pain [R10.84] Nausea vomiting and diarrhea [R11.2, R19.7] Intractable vomiting with nausea, unspecified vomiting type [R11.2]  DISCHARGE DIAGNOSIS:  Active Problems:   Gastroparesis   SECONDARY DIAGNOSIS:   Past Medical History:  Diagnosis Date  . Diabetes mellitus without complication (HCC)   . Gastritis   . Gastroparesis   . Hernia of abdominal wall   . Hypertension   . Migraine      ADMITTING HISTORY  HISTORY OF PRESENT ILLNESS: Daisy [name]  is a 51 y.o. female with a known history of Type 2 diabetes mellitus, abdominal hernia, hypertension, migraine, gastroparesis presented to the emergency room with nausea and vomiting for the last few days. Patient also had some diarrhea. The vomitus contained food and water. She has not been anyone to eat and drink fluids because of the nausea. Patient was evaluated in the emergency room with CT abdomen which showed no obstruction. She was started on IV fluids. Patient is due for surgical repair of the ventral hernia in August. In the emergency room patient blood pressure was also elevated. No complaints of any headache, dizziness and blurry vision.  HOSPITAL COURSE:   #1 Gastroparesis. Patient has diagnosis of gastroparesis and gastritis in the past. [facility]. She has not been following up with GI. Patient is already on PPIs twice a day, Carafate, Phenergan, Zofran. Eyes feel that she is on appropriate treatment at this time. Would not add Reglan in addition to Phenergan due to synergistic effect. [facility]. Patient also has  follow-up with [facility] for pain at the ventral hernia site which is contributing. Patient on the day of discharge has nausea but no vomiting. Tolerated her food well. Continue home medications and discharged home in stable condition.  #2 Accelerated hypertension. Patient had significantly elevated blood pressure on admission due to throwing up on her blood pressure pills. She was also started on hydralazine in addition to home medications which dropped her blood pressure into the hypotensive range. At this point hydralazine was stopped. She is doing better on her home medications which will be continued.  #3 diabetes mellitus type 2 Patient will continue her home medications. Follow-up with primary care physician.  #4 .depression: Continue Lamictal, Prozac  CONSULTS OBTAINED:    DRUG ALLERGIES:   Allergies  Allergen Reactions  . Acetaminophen-Codeine Hives  . Codeine Hives  . Ibuprofen Nausea And Vomiting  . Morphine Nausea And Vomiting  . Propoxyphene Other (See Comments)    Reaction:  GI upset   . Sulfa Antibiotics Hives  . Duloxetine Rash and Other (See Comments)    Yellowing of the skin    DISCHARGE MEDICATIONS:   Discharge Medication List as of 01/05/2017 10:42 AM    CONTINUE these medications which have NOT CHANGED   Details  albuterol (PROVENTIL HFA;VENTOLIN HFA) 108 (90 Base) MCG/ACT inhaler Inhale 2 puffs into the lungs every 6 (six) hours as needed for wheezing or shortness of breath., Starting Tue 07/27/2016, Until Mon 01/03/2017, Normal    alprazolam (XANAX) 2 MG tablet Take 2 mg by mouth 3 (three) times daily  as needed for anxiety. , Historical Med    amLODipine (NORVASC) 5 MG tablet Take 1 tablet (5 mg total) by mouth daily., Starting Fri 06/18/2016, Normal    cholecalciferol (VITAMIN D) 1000 units tablet Take 1,000 Units by mouth daily., Historical Med    cloNIDine (CATAPRES - DOSED IN MG/24 HR) 0.1 mg/24hr patch PLACE 1 PATCH (0.1 MG TOTAL)  ONTO THE SKIN ONCE A WEEK., Starting Tue 11/30/2016, Normal    FLUoxetine (PROZAC) 20 MG capsule Take 20 mg by mouth daily., Historical Med    furosemide (LASIX) 20 MG tablet Take 1 tablet (20 mg total) by mouth daily as needed for edema., Starting Fri 06/18/2016, Normal    lamoTRIgine (LAMICTAL) 100 MG tablet Take 100 mg by mouth daily., Historical Med    lisinopril (PRINIVIL,ZESTRIL) 20 MG tablet Take 1.5 tablets (30 mg total) by mouth daily., Starting Wed 11/10/2016, Normal    metFORMIN (GLUCOPHAGE) 500 MG tablet Take 1 tablet (500 mg total) by mouth daily., Starting Fri 06/18/2016, Normal    metoprolol succinate (TOPROL-XL) 25 MG 24 hr tablet Take 2 tablets (50 mg total) by mouth daily., Starting Fri 06/18/2016, Normal    ondansetron (ZOFRAN) 4 MG tablet Take 4 mg by mouth every 8 (eight) hours as needed for nausea., Historical Med    Oxycodone HCl 10 MG TABS Take 10 mg by mouth every 4 (four) hours as needed. Take 4-7 tablets daily, Starting Mon 05/31/2016, Historical Med    pantoprazole (PROTONIX) 40 MG tablet Take 1 tablet (40 mg total) by mouth 2 (two) times daily., Starting Wed 11/10/2016, Normal    potassium chloride SA (K-DUR,KLOR-CON) 20 MEQ tablet Take 1 tablet (20 mEq total) by mouth daily. Take with furosemide, Starting Fri 06/18/2016, Normal    promethazine (PHENERGAN) 12.5 MG tablet Take 1 tablet (12.5 mg total) by mouth every 6 (six) hours as needed for refractory nausea / vomiting., Starting Mon 06/28/2016, Normal    sucralfate (CARAFATE) 1 g tablet Take 1 tablet (1 g total) by mouth 4 (four) times daily., Starting Fri 03/26/2016, Print    tiZANidine (ZANAFLEX) 4 MG tablet Take 4-12 mg by mouth 3 (three) times daily. 1-3 tablets per day as tolerated, Historical Med    traZODone (DESYREL) 100 MG tablet Take 100 mg by mouth at bedtime. , Starting Tue 10/12/2016, Historical Med    vitamin B-12 (CYANOCOBALAMIN) 1000 MCG tablet Take 1,000 mcg by mouth daily., Historical Med     zolpidem (AMBIEN) 10 MG tablet Take 10 mg by mouth at bedtime., Historical Med      STOP taking these medications     HYDROcodone-acetaminophen (NORCO) 5-325 MG tablet      ranitidine (ZANTAC) 150 MG tablet         Today   VITAL SIGNS:  Blood pressure 101/84, pulse 80, temperature 98 F (36.7 C), temperature source Oral, resp. rate 18, height 5\' 4"  (1.626 m), weight 125.2 kg (276 lb), SpO2 96 %.  I/O:  No intake or output data in the 24 hours ending 01/06/17 1550  PHYSICAL EXAMINATION:  Physical Exam  GENERAL:  51 y.o.-year-old patient lying in the bed with no acute distress.  LUNGS: Normal breath sounds bilaterally, no wheezing, rales,rhonchi or crepitation. No use of accessory muscles of respiration.  CARDIOVASCULAR: S1, S2 normal. No murmurs, rubs, or gallops.  ABDOMEN: Soft, non-tender, non-distended. Bowel sounds present. No organomegaly or mass.  NEUROLOGIC: Moves all 4 extremities. PSYCHIATRIC: The patient is alert and oriented x 3.  SKIN: No obvious  rash, lesion, or ulcer.   DATA REVIEW:   CBC  Recent Labs Lab 01/04/17 0451  WBC 8.9  HGB 12.0  HCT 36.0  PLT 349    Chemistries   Recent Labs Lab 01/03/17 0353 01/03/17 0923 01/04/17 0451  NA 140  --  137  K 3.2*  --  3.5  CL 104  --  100*  CO2 23  --  27  GLUCOSE 166*  --  117*  BUN 9  --  10  CREATININE 0.83  --  0.91  CALCIUM 9.6  --  8.4*  MG  --  1.4*  --   AST 16  --   --   ALT 11*  --   --   ALKPHOS 47  --   --   BILITOT 1.0  --   --     Cardiac Enzymes No results for input(s): TROPONINI in the last 168 hours.  Microbiology Results  Results for orders placed or performed in visit on 11/12/16  Urine culture     Status: None   Collection Time: 11/12/16 12:00 AM  Result Value Ref Range Status   Urine Culture, Routine Final report  Final   Organism ID, Bacteria Comment  Final    Comment: Mixed urogenital flora 10,000-25,000 colony forming units per mL     RADIOLOGY:  No  results found.  Follow up with PCP in 1 week.  Management plans discussed with the patient, family and they are in agreement.  CODE STATUS:  Code Status History    Date Active Date Inactive Code Status Order ID Comments User Context   01/03/2017  8:24 AM 01/05/2017  3:57 PM Full Code [id number]  Ihor AustinPyreddy, Pavan, MD Inpatient   03/23/2016  6:29 AM 03/26/2016  4:51 PM Full Code [id number]  Arnaldo Nataliamond, Michael S, MD Inpatient   09/28/2015  4:54 AM 09/29/2015  5:20 PM Full Code [id number]  Ihor AustinPyreddy, Pavan, MD ED   05/05/2015  8:36 PM 05/09/2015  8:06 PM Full Code [id number]  Hower, Cletis Athensavid K, MD ED   01/07/2015  7:11 PM 01/09/2015  6:55 PM Full Code [id number]  Milagros LollSudini, Blayre Papania, MD ED      TOTAL TIME TAKING CARE OF THIS PATIENT ON DAY OF DISCHARGE: more than 30 minutes.   Milagros LollSudini, Anquanette Bahner R M.D on 01/06/2017 at 3:50 PM  Between 7am to 6pm - Pager - [phone]  After 6pm go to [email or web address] - password [email or web address] [facility]  [facility]  Office  [phone]  CC: Primary care physician; Malva LimesFisher, Donald E, MD  Note: This dictation was prepared with Dragon dictation along with smaller phrase technology. Any transcriptional errors that result from this process are unintentional.

## 2017-01-11 DIAGNOSIS — M47817 Spondylosis without myelopathy or radiculopathy, lumbosacral region: Secondary | ICD-10-CM | POA: Diagnosis not present

## 2017-01-11 DIAGNOSIS — Z79891 Long term (current) use of opiate analgesic: Secondary | ICD-10-CM | POA: Diagnosis not present

## 2017-01-11 DIAGNOSIS — M5416 Radiculopathy, lumbar region: Secondary | ICD-10-CM | POA: Diagnosis not present

## 2017-01-11 DIAGNOSIS — M5481 Occipital neuralgia: Secondary | ICD-10-CM | POA: Diagnosis not present

## 2017-01-11 DIAGNOSIS — M791 Myalgia: Secondary | ICD-10-CM | POA: Diagnosis not present

## 2017-01-11 DIAGNOSIS — M5136 Other intervertebral disc degeneration, lumbar region: Secondary | ICD-10-CM | POA: Diagnosis not present

## 2017-01-11 DIAGNOSIS — M5137 Other intervertebral disc degeneration, lumbosacral region: Secondary | ICD-10-CM | POA: Diagnosis not present

## 2017-01-11 DIAGNOSIS — M5418 Radiculopathy, sacral and sacrococcygeal region: Secondary | ICD-10-CM | POA: Diagnosis not present

## 2017-01-11 DIAGNOSIS — M545 Low back pain: Secondary | ICD-10-CM | POA: Diagnosis not present

## 2017-01-12 ENCOUNTER — Other Ambulatory Visit: Payer: Self-pay | Admitting: Family Medicine

## 2017-01-12 ENCOUNTER — Inpatient Hospital Stay: Payer: Medicare Other | Admitting: Family Medicine

## 2017-01-12 DIAGNOSIS — I10 Essential (primary) hypertension: Secondary | ICD-10-CM

## 2017-01-18 DIAGNOSIS — K432 Incisional hernia without obstruction or gangrene: Secondary | ICD-10-CM | POA: Diagnosis not present

## 2017-01-21 ENCOUNTER — Ambulatory Visit (INDEPENDENT_AMBULATORY_CARE_PROVIDER_SITE_OTHER): Payer: Medicare Other | Admitting: Family Medicine

## 2017-01-21 ENCOUNTER — Encounter: Payer: Self-pay | Admitting: Family Medicine

## 2017-01-21 VITALS — BP 100/60 | HR 72 | Temp 98.4°F | Resp 16 | Wt 300.0 lb

## 2017-01-21 DIAGNOSIS — K3184 Gastroparesis: Secondary | ICD-10-CM

## 2017-01-21 DIAGNOSIS — I10 Essential (primary) hypertension: Secondary | ICD-10-CM

## 2017-01-21 NOTE — Progress Notes (Signed)
Patient: Daisy [name] Female    DOB: March 18, 1966   51 y.o.   MRN: [id number] Visit Date: 01/21/2017  Today's Provider: Mila Merry, MD   Chief Complaint  Patient presents with  . Hospitalization Follow-up   Subjective:    HPI  Follow up Hospitalization  Patient was admitted to [facility] on 01/03/2017 and discharged on 01/05/2017. She was treated for Gastroparesis and accelerated hypertension. Treatment for this included advised follow-up with GI [facility] and follow-up with surgical department at [facility] for pain at the ventral hernia site. For her accelerated hypertension she was started on hydralazine in addition to home medications which dropped her blood pressure into the hypotensive range. At this point hydralazine was stopped. Patient was to continue her home blood pressure medications. Patient was advised to follow up with PCP in 1 week after discharge.  She reports good compliance with treatment. She reports this condition is Improved. Appetites is now returned. Eating and drinking without pain.  ------------------------------------------------------------------------------------       Allergies  Allergen Reactions  . Acetaminophen-Codeine Hives  . Codeine Hives  . Ibuprofen Nausea And Vomiting  . Morphine Nausea And Vomiting  . Propoxyphene Other (See Comments)    Reaction:  GI upset   . Sulfa Antibiotics Hives  . Duloxetine Rash and Other (See Comments)    Yellowing of the skin     Current Outpatient Prescriptions:  .  alprazolam (XANAX) 2 MG tablet, Take 2 mg by mouth 3 (three) times daily as needed for anxiety. , Disp: , Rfl:  .  amLODipine (NORVASC) 5 MG tablet, Take 1 tablet (5 mg total) by mouth daily., Disp: 90 tablet, Rfl: 3 .  cholecalciferol (VITAMIN D) 1000 units tablet, Take 1,000 Units by mouth daily., Disp: , Rfl:  .  cloNIDine (CATAPRES - DOSED IN MG/24 HR) 0.1 mg/24hr patch, PLACE 1 PATCH (0.1 MG TOTAL) ONTO THE SKIN ONCE A WEEK., Disp: 4  patch, Rfl: 6 .  FLUoxetine (PROZAC) 20 MG capsule, Take 20 mg by mouth daily., Disp: , Rfl:  .  furosemide (LASIX) 20 MG tablet, TAKE 1 TABLET (20 MG TOTAL) BY MOUTH DAILY AS NEEDED FOR EDEMA., Disp: 90 tablet, Rfl: 1 .  KLOR-CON M20 20 MEQ tablet, TAKE 1 TABLET (20 MEQ TOTAL) BY MOUTH DAILY. TAKE WITH FUROSEMIDE, Disp: 90 tablet, Rfl: 1 .  lamoTRIgine (LAMICTAL) 100 MG tablet, Take 100 mg by mouth daily., Disp: , Rfl:  .  lisinopril (PRINIVIL,ZESTRIL) 20 MG tablet, Take 1.5 tablets (30 mg total) by mouth daily. (Patient taking differently: Take 20 mg by mouth daily. ), Disp: 90 tablet, Rfl: 2 .  metFORMIN (GLUCOPHAGE) 500 MG tablet, Take 1 tablet (500 mg total) by mouth daily., Disp: 90 tablet, Rfl: 4 .  metoprolol succinate (TOPROL-XL) 25 MG 24 hr tablet, Take 2 tablets (50 mg total) by mouth daily., Disp: 180 tablet, Rfl: 3 .  ondansetron (ZOFRAN) 4 MG tablet, Take 4 mg by mouth every 8 (eight) hours as needed for nausea., Disp: , Rfl:  .  Oxycodone HCl 10 MG TABS, Take 10 mg by mouth every 4 (four) hours as needed. Take 4-7 tablets daily, Disp: , Rfl:  .  pantoprazole (PROTONIX) 40 MG tablet, Take 1 tablet (40 mg total) by mouth 2 (two) times daily., Disp: 180 tablet, Rfl: 4 .  promethazine (PHENERGAN) 12.5 MG tablet, Take 1 tablet (12.5 mg total) by mouth every 6 (six) hours as needed for refractory nausea / vomiting., Disp:  30 tablet, Rfl: 3 .  sucralfate (CARAFATE) 1 g tablet, Take 1 tablet (1 g total) by mouth 4 (four) times daily., Disp: 100 tablet, Rfl: 1 .  tiZANidine (ZANAFLEX) 4 MG tablet, Take 4-12 mg by mouth 3 (three) times daily. 1-3 tablets per day as tolerated, Disp: , Rfl:  .  traZODone (DESYREL) 100 MG tablet, Take 100 mg by mouth at bedtime. , Disp: , Rfl:  .  vitamin B-12 (CYANOCOBALAMIN) 1000 MCG tablet, Take 1,000 mcg by mouth daily., Disp: , Rfl:  .  zolpidem (AMBIEN) 10 MG tablet, Take 10 mg by mouth at bedtime., Disp: , Rfl:  .  albuterol (PROVENTIL HFA;VENTOLIN HFA) 108  (90 Base) MCG/ACT inhaler, Inhale 2 puffs into the lungs every 6 (six) hours as needed for wheezing or shortness of breath., Disp: 1 Inhaler, Rfl: 2  Review of Systems  Constitutional: Negative for appetite change, chills, fatigue and fever.  Respiratory: Negative for chest tightness and shortness of breath.   Cardiovascular: Negative for chest pain and palpitations.  Gastrointestinal: Positive for abdominal distention, abdominal pain and nausea. Negative for vomiting.  Neurological: Negative for dizziness and weakness.    Social History  Substance Use Topics  . Smoking status: Never Smoker  . Smokeless tobacco: Former NeurosurgeonUser    Types: Snuff  . Alcohol use 0.0 oz/week     Comment: only on special occasions- wine   Objective:   BP 100/60 (BP Location: Right Arm, Patient Position: Sitting, Cuff Size: Large)   Pulse 72   Temp 98.4 F (36.9 C) (Oral)   Resp 16   Wt 300 lb (136.1 kg)   SpO2 97% Comment: room air  BMI 51.49 kg/m  There were no vitals filed for this visit.   Physical Exam  General Appearance:    Alert, cooperative, no distress, obese  Eyes:    PERRL, conjunctiva/corneas clear, EOM's intact       Lungs:     Clear to auscultation bilaterally, respirations unlabored  Heart:    Regular rate and rhythm  Neurologic:   Awake, alert, oriented x 3. No apparent focal neurological           defect.           Assessment & Plan:     1. Essential hypertension Stable with recent medication changes.   2. Gastroparesis Symptomatically resolved.   Return in 3 months (on 04/15/2017).      The entirety of the information documented in the History of Present Illness, Review of Systems and Physical Exam were personally obtained by me. Portions of this information were initially documented by Awilda Billoshena Chambers, CMA and reviewed by me for thoroughness and accuracy.    Mila Merryonald Garnet Overfield, MD  [facility]

## 2017-02-06 ENCOUNTER — Other Ambulatory Visit: Payer: Self-pay | Admitting: Family Medicine

## 2017-02-06 DIAGNOSIS — J209 Acute bronchitis, unspecified: Secondary | ICD-10-CM

## 2017-02-08 DIAGNOSIS — M791 Myalgia: Secondary | ICD-10-CM | POA: Diagnosis not present

## 2017-02-08 DIAGNOSIS — M47817 Spondylosis without myelopathy or radiculopathy, lumbosacral region: Secondary | ICD-10-CM | POA: Diagnosis not present

## 2017-02-08 DIAGNOSIS — M5416 Radiculopathy, lumbar region: Secondary | ICD-10-CM | POA: Diagnosis not present

## 2017-02-08 DIAGNOSIS — M5481 Occipital neuralgia: Secondary | ICD-10-CM | POA: Diagnosis not present

## 2017-03-04 ENCOUNTER — Other Ambulatory Visit: Payer: Self-pay

## 2017-03-04 MED ORDER — PROMETHAZINE HCL 12.5 MG PO TABS: 12.5000 mg | ORAL_TABLET | Freq: Four times a day (QID) | ORAL | 3 refills | Status: DC | PRN

## 2017-03-04 NOTE — Telephone Encounter (Signed)
Patient is requesting refills for the following medication. promethazine (PHENERGAN) 12.5 MG tablet  Pharmacy: [facility].  Thanks,  -Elani Delph

## 2017-03-08 DIAGNOSIS — Z5181 Encounter for therapeutic drug level monitoring: Secondary | ICD-10-CM | POA: Diagnosis not present

## 2017-03-08 DIAGNOSIS — M5481 Occipital neuralgia: Secondary | ICD-10-CM | POA: Diagnosis not present

## 2017-03-08 DIAGNOSIS — M5416 Radiculopathy, lumbar region: Secondary | ICD-10-CM | POA: Diagnosis not present

## 2017-03-08 DIAGNOSIS — M47817 Spondylosis without myelopathy or radiculopathy, lumbosacral region: Secondary | ICD-10-CM | POA: Diagnosis not present

## 2017-03-08 DIAGNOSIS — M791 Myalgia: Secondary | ICD-10-CM | POA: Diagnosis not present

## 2017-03-08 DIAGNOSIS — M5030 Other cervical disc degeneration, unspecified cervical region: Secondary | ICD-10-CM | POA: Diagnosis not present

## 2017-03-08 DIAGNOSIS — M5136 Other intervertebral disc degeneration, lumbar region: Secondary | ICD-10-CM | POA: Diagnosis not present

## 2017-03-09 DIAGNOSIS — K432 Incisional hernia without obstruction or gangrene: Secondary | ICD-10-CM | POA: Diagnosis not present

## 2017-03-09 DIAGNOSIS — E1169 Type 2 diabetes mellitus with other specified complication: Secondary | ICD-10-CM | POA: Diagnosis not present

## 2017-03-09 DIAGNOSIS — Z79899 Other long term (current) drug therapy: Secondary | ICD-10-CM | POA: Diagnosis not present

## 2017-03-09 DIAGNOSIS — Z882 Allergy status to sulfonamides status: Secondary | ICD-10-CM | POA: Diagnosis not present

## 2017-03-09 DIAGNOSIS — Z1321 Encounter for screening for nutritional disorder: Secondary | ICD-10-CM | POA: Diagnosis not present

## 2017-03-09 DIAGNOSIS — K3184 Gastroparesis: Secondary | ICD-10-CM | POA: Diagnosis not present

## 2017-03-09 DIAGNOSIS — Z888 Allergy status to other drugs, medicaments and biological substances status: Secondary | ICD-10-CM | POA: Diagnosis not present

## 2017-03-09 DIAGNOSIS — E119 Type 2 diabetes mellitus without complications: Secondary | ICD-10-CM | POA: Diagnosis not present

## 2017-03-09 DIAGNOSIS — G4700 Insomnia, unspecified: Secondary | ICD-10-CM | POA: Diagnosis not present

## 2017-03-09 DIAGNOSIS — Z881 Allergy status to other antibiotic agents status: Secondary | ICD-10-CM | POA: Diagnosis not present

## 2017-03-09 DIAGNOSIS — G4733 Obstructive sleep apnea (adult) (pediatric): Secondary | ICD-10-CM | POA: Diagnosis not present

## 2017-03-09 DIAGNOSIS — I10 Essential (primary) hypertension: Secondary | ICD-10-CM | POA: Diagnosis not present

## 2017-03-09 DIAGNOSIS — Z885 Allergy status to narcotic agent status: Secondary | ICD-10-CM | POA: Diagnosis not present

## 2017-03-09 DIAGNOSIS — K219 Gastro-esophageal reflux disease without esophagitis: Secondary | ICD-10-CM | POA: Diagnosis not present

## 2017-03-09 DIAGNOSIS — E1143 Type 2 diabetes mellitus with diabetic autonomic (poly)neuropathy: Secondary | ICD-10-CM | POA: Diagnosis not present

## 2017-03-15 ENCOUNTER — Observation Stay
Admission: EM | Admit: 2017-03-15 | Discharge: 2017-03-18 | Disposition: A | Discharge status: Home / Self Care | Payer: Medicare Other | Attending: Internal Medicine | Admitting: Internal Medicine

## 2017-03-15 ENCOUNTER — Encounter: Payer: Self-pay | Admitting: *Deleted

## 2017-03-15 DIAGNOSIS — R1110 Vomiting, unspecified: Secondary | ICD-10-CM | POA: Diagnosis not present

## 2017-03-15 DIAGNOSIS — I160 Hypertensive urgency: Secondary | ICD-10-CM | POA: Diagnosis not present

## 2017-03-15 DIAGNOSIS — E785 Hyperlipidemia, unspecified: Secondary | ICD-10-CM | POA: Diagnosis not present

## 2017-03-15 DIAGNOSIS — Z90710 Acquired absence of both cervix and uterus: Secondary | ICD-10-CM | POA: Insufficient documentation

## 2017-03-15 DIAGNOSIS — F419 Anxiety disorder, unspecified: Secondary | ICD-10-CM | POA: Insufficient documentation

## 2017-03-15 DIAGNOSIS — R112 Nausea with vomiting, unspecified: Secondary | ICD-10-CM

## 2017-03-15 DIAGNOSIS — Z6841 Body Mass Index (BMI) 40.0 and over, adult: Secondary | ICD-10-CM | POA: Insufficient documentation

## 2017-03-15 DIAGNOSIS — R1084 Generalized abdominal pain: Secondary | ICD-10-CM | POA: Diagnosis not present

## 2017-03-15 DIAGNOSIS — K439 Ventral hernia without obstruction or gangrene: Secondary | ICD-10-CM | POA: Insufficient documentation

## 2017-03-15 DIAGNOSIS — R197 Diarrhea, unspecified: Secondary | ICD-10-CM

## 2017-03-15 DIAGNOSIS — Z9049 Acquired absence of other specified parts of digestive tract: Secondary | ICD-10-CM | POA: Diagnosis not present

## 2017-03-15 DIAGNOSIS — Z7984 Long term (current) use of oral hypoglycemic drugs: Secondary | ICD-10-CM | POA: Insufficient documentation

## 2017-03-15 DIAGNOSIS — E876 Hypokalemia: Secondary | ICD-10-CM | POA: Insufficient documentation

## 2017-03-15 DIAGNOSIS — F3181 Bipolar II disorder: Secondary | ICD-10-CM | POA: Insufficient documentation

## 2017-03-15 DIAGNOSIS — I10 Essential (primary) hypertension: Secondary | ICD-10-CM | POA: Diagnosis not present

## 2017-03-15 DIAGNOSIS — Z87891 Personal history of nicotine dependence: Secondary | ICD-10-CM | POA: Diagnosis not present

## 2017-03-15 DIAGNOSIS — K2970 Gastritis, unspecified, without bleeding: Secondary | ICD-10-CM | POA: Insufficient documentation

## 2017-03-15 DIAGNOSIS — Z79899 Other long term (current) drug therapy: Secondary | ICD-10-CM | POA: Insufficient documentation

## 2017-03-15 DIAGNOSIS — Z8669 Personal history of other diseases of the nervous system and sense organs: Secondary | ICD-10-CM | POA: Diagnosis not present

## 2017-03-15 DIAGNOSIS — K449 Diaphragmatic hernia without obstruction or gangrene: Secondary | ICD-10-CM | POA: Diagnosis not present

## 2017-03-15 DIAGNOSIS — G894 Chronic pain syndrome: Secondary | ICD-10-CM | POA: Diagnosis not present

## 2017-03-15 DIAGNOSIS — M5136 Other intervertebral disc degeneration, lumbar region: Secondary | ICD-10-CM | POA: Diagnosis not present

## 2017-03-15 DIAGNOSIS — K219 Gastro-esophageal reflux disease without esophagitis: Secondary | ICD-10-CM | POA: Insufficient documentation

## 2017-03-15 DIAGNOSIS — K3184 Gastroparesis: Secondary | ICD-10-CM | POA: Diagnosis not present

## 2017-03-15 DIAGNOSIS — R1030 Lower abdominal pain, unspecified: Secondary | ICD-10-CM | POA: Diagnosis not present

## 2017-03-15 DIAGNOSIS — E119 Type 2 diabetes mellitus without complications: Secondary | ICD-10-CM | POA: Insufficient documentation

## 2017-03-15 DIAGNOSIS — R1111 Vomiting without nausea: Secondary | ICD-10-CM | POA: Diagnosis not present

## 2017-03-15 LAB — COMPREHENSIVE METABOLIC PANEL
ALK PHOS: 56 U/L (ref 38–126)
ALT: 14 U/L (ref 14–54)
AST: 22 U/L (ref 15–41)
Albumin: 4.6 g/dL (ref 3.5–5.0)
Anion gap: 14 (ref 5–15)
BILIRUBIN TOTAL: 0.7 mg/dL (ref 0.3–1.2)
BUN: 8 mg/dL (ref 6–20)
CALCIUM: 9.6 mg/dL (ref 8.9–10.3)
CO2: 26 mmol/L (ref 22–32)
Chloride: 100 mmol/L — ABNORMAL LOW (ref 101–111)
Creatinine, Ser: 0.76 mg/dL (ref 0.44–1.00)
GFR calc non Af Amer: >60 mL/min (ref >60)
GLUCOSE: 183 mg/dL — AB (ref 65–99)
Potassium: 3.1 mmol/L — ABNORMAL LOW (ref 3.5–5.1)
SODIUM: 140 mmol/L (ref 135–145)
TOTAL PROTEIN: 9.4 g/dL — AB (ref 6.5–8.1)

## 2017-03-15 LAB — CBC
HEMATOCRIT: 44.2 % (ref 35.0–47.0)
Hemoglobin: 14.6 g/dL (ref 12.0–16.0)
MCH: 27.8 pg (ref 26.0–34.0)
MCHC: 33.1 g/dL (ref 32.0–36.0)
MCV: 84.1 fL (ref 80.0–100.0)
PLATELETS: 371 10*3/uL (ref 150–440)
RBC: 5.26 MIL/uL — ABNORMAL HIGH (ref 3.80–5.20)
RDW: 15.5 % — AB (ref 11.5–14.5)
WBC: 13 10*3/uL — AB (ref 3.6–11.0)

## 2017-03-15 LAB — PREGNANCY, URINE: PREG TEST UR: NEGATIVE

## 2017-03-15 LAB — LIPASE, BLOOD: Lipase: 19 U/L (ref 11–51)

## 2017-03-15 MED ORDER — METOCLOPRAMIDE HCL 5 MG/ML IJ SOLN
10.0000 mg | Freq: Once | INTRAMUSCULAR | Status: AC
  Administered 2017-03-16: 10 mg via INTRAVENOUS
  Filled 2017-03-15: qty 2

## 2017-03-15 MED ORDER — KETOROLAC TROMETHAMINE 30 MG/ML IJ SOLN
15.0000 mg | Freq: Once | INTRAMUSCULAR | Status: AC
  Administered 2017-03-15: 15 mg via INTRAVENOUS

## 2017-03-15 MED ORDER — HYDROMORPHONE HCL 1 MG/ML IJ SOLN
0.5000 mg | Freq: Once | INTRAMUSCULAR | Status: AC
  Administered 2017-03-15: 0.5 mg via INTRAVENOUS
  Filled 2017-03-15: qty 1

## 2017-03-15 MED ORDER — KETOROLAC TROMETHAMINE 30 MG/ML IJ SOLN
INTRAMUSCULAR | Status: AC
  Administered 2017-03-15: 15 mg via INTRAVENOUS
  Filled 2017-03-15: qty 1

## 2017-03-15 MED ORDER — SODIUM CHLORIDE 0.9 % IV BOLUS (SEPSIS)
1000.0000 mL | Freq: Once | INTRAVENOUS | Status: AC
  Administered 2017-03-15: 1000 mL via INTRAVENOUS

## 2017-03-15 MED ORDER — MORPHINE SULFATE (PF) 2 MG/ML IV SOLN
INTRAVENOUS | Status: AC
  Filled 2017-03-15: qty 1

## 2017-03-15 MED ORDER — IOPAMIDOL (ISOVUE-300) INJECTION 61%
30.0000 mL | Freq: Once | INTRAVENOUS | Status: AC | PRN
  Administered 2017-03-15: 30 mL via ORAL

## 2017-03-15 MED ORDER — LABETALOL HCL 5 MG/ML IV SOLN
10.0000 mg | Freq: Once | INTRAVENOUS | Status: AC
  Administered 2017-03-15: 10 mg via INTRAVENOUS
  Filled 2017-03-15: qty 4

## 2017-03-15 MED ORDER — FAMOTIDINE IN NACL 20-0.9 MG/50ML-% IV SOLN
20.0000 mg | Freq: Once | INTRAVENOUS | Status: AC
  Administered 2017-03-15: 20 mg via INTRAVENOUS
  Filled 2017-03-15: qty 50

## 2017-03-15 MED ORDER — ONDANSETRON HCL 4 MG/2ML IJ SOLN
4.0000 mg | Freq: Once | INTRAMUSCULAR | Status: AC
  Administered 2017-03-15: 4 mg via INTRAVENOUS
  Filled 2017-03-15: qty 2

## 2017-03-15 NOTE — ED Provider Notes (Signed)
[facility] Emergency Department Provider Note ____________________________________________   First MD Initiated Contact with Patient 03/15/17 2149     (approximate)  I have reviewed the triage vital signs and the nursing notes.   HISTORY  Chief Complaint Emesis and Diarrhea    HPI Daisy [name] is a 51 y.o. female with a past history of diabetes, gastritis, gastroparesis, hypertension, who presents with nausea, vomiting, and diarrhea, acute onset 6 hours ago, nonbloody and nonbilious, and associated with diffuse abdominal pain. Patient reports prior history of similar episodes associated with her gastritis. She denies any new or unusual foods or sick contacts.  Past Medical History:  Diagnosis Date  . Diabetes mellitus without complication (HCC)   . Gastritis   . Gastroparesis   . Hernia of abdominal wall   . Hypertension   . Migraine     Patient Active Problem List   Diagnosis Date Noted  . Gastroparesis 01/03/2017  . Hyperlipidemia 06/18/2016  . Controlled type 2 diabetes mellitus without complication, without long-term current use of insulin (HCC) 06/18/2016  . Bipolar 2 disorder (HCC) 06/18/2016  . Depression 06/18/2016  . GERD (gastroesophageal reflux disease) 06/18/2016  . Hypertension 06/18/2016  . Ingrown toenail 06/18/2016  . Hypokalemia 09/28/2015  . Chronic abdominal pain 05/23/2015  . Complex ovarian cyst 05/23/2015  . Anxiety 03/27/2015  . Insomnia 03/27/2015  . Gastritis 01/07/2015  . DDD (degenerative disc disease), lumbar 11/28/2014  . DJD (degenerative joint disease) of knee 11/28/2014  . Migraine headache 11/28/2014  . Bilateral occipital neuralgia 11/28/2014  . Morbid obesity (HCC) 11/28/2014  . Neuropathy due to secondary diabetes (HCC) 11/28/2014  . Abdominal wall abscess 09/20/2014  . Back pain, chronic 09/20/2014  . Incisional hernia, without obstruction or gangrene 04/30/2014  . Bilateral chronic knee pain  03/04/2014    Past Surgical History:  Procedure Laterality Date  . ABDOMINAL HYSTERECTOMY    . APPENDECTOMY    . APPLICATION OF WOUND VAC    . CHOLECYSTECTOMY    . HERNIA REPAIR    . KNEE SURGERY      Prior to Admission medications   Medication Sig Start Date End Date Taking? Authorizing Provider  albuterol (PROVENTIL HFA;VENTOLIN HFA) 108 (90 Base) MCG/ACT inhaler Inhale 2 puffs into the lungs every 6 (six) hours as needed for wheezing or shortness of breath. 02/06/17 03/08/17  Malva Limes, MD  alprazolam Prudy Feeler) 2 MG tablet Take 2 mg by mouth 3 (three) times daily as needed for anxiety.     [provider]  amLODipine (NORVASC) 5 MG tablet Take 1 tablet (5 mg total) by mouth daily. 06/18/16   Malva Limes, MD  cholecalciferol (VITAMIN D) 1000 units tablet Take 1,000 Units by mouth daily.    [provider]  cloNIDine (CATAPRES - DOSED IN MG/24 HR) 0.1 mg/24hr patch PLACE 1 PATCH (0.1 MG TOTAL) ONTO THE SKIN ONCE A WEEK. 11/30/16   Malva Limes, MD  FLUoxetine (PROZAC) 20 MG capsule Take 20 mg by mouth daily.    [provider]  furosemide (LASIX) 20 MG tablet TAKE 1 TABLET (20 MG TOTAL) BY MOUTH DAILY AS NEEDED FOR EDEMA. 01/13/17   Malva Limes, MD  KLOR-CON M20 20 MEQ tablet TAKE 1 TABLET (20 MEQ TOTAL) BY MOUTH DAILY. TAKE WITH FUROSEMIDE 01/13/17   Malva Limes, MD  lamoTRIgine (LAMICTAL) 100 MG tablet Take 100 mg by mouth daily.    [provider]  lisinopril (PRINIVIL,ZESTRIL) 20 MG tablet  Take 1.5 tablets (30 mg total) by mouth daily. Patient taking differently: Take 20 mg by mouth daily.  11/10/16   Malva Limes, MD  metFORMIN (GLUCOPHAGE) 500 MG tablet Take 1 tablet (500 mg total) by mouth daily. 06/18/16   Malva Limes, MD  metoprolol succinate (TOPROL-XL) 25 MG 24 hr tablet Take 2 tablets (50 mg total) by mouth daily. 06/18/16   Malva Limes, MD  ondansetron (ZOFRAN) 4 MG tablet Take 4 mg by mouth every 8 (eight) hours as  needed for nausea.    [provider]  Oxycodone HCl 10 MG TABS Take 10 mg by mouth every 4 (four) hours as needed. Take 4-7 tablets daily 05/31/16   [provider]  pantoprazole (PROTONIX) 40 MG tablet Take 1 tablet (40 mg total) by mouth 2 (two) times daily. 11/10/16   Malva Limes, MD  promethazine (PHENERGAN) 12.5 MG tablet Take 1 tablet (12.5 mg total) by mouth every 6 (six) hours as needed for refractory nausea / vomiting. 03/04/17   Malva Limes, MD  sucralfate (CARAFATE) 1 g tablet Take 1 tablet (1 g total) by mouth 4 (four) times daily. 03/26/16   Altamese Dilling, MD  tiZANidine (ZANAFLEX) 4 MG tablet Take 4-12 mg by mouth 3 (three) times daily. 1-3 tablets per day as tolerated    [provider]  traZODone (DESYREL) 100 MG tablet Take 100 mg by mouth at bedtime.  10/12/16   [provider]  vitamin B-12 (CYANOCOBALAMIN) 1000 MCG tablet Take 1,000 mcg by mouth daily.    [provider]  zolpidem (AMBIEN) 10 MG tablet Take 10 mg by mouth at bedtime.    [provider]    Allergies Acetaminophen-codeine; Codeine; Ibuprofen; Morphine; Propoxyphene; Sulfa antibiotics; and Duloxetine  Family History  Problem Relation Age of Onset  . Arthritis Mother   . Depression Mother   . Diabetes Mother   . Heart disease Mother   . Hyperlipidemia Mother   . Hypertension Mother   . Stroke Mother   . Arthritis Father   . Diabetes Father   . Hearing loss Father   . Heart disease Father   . Hyperlipidemia Father   . Hypertension Father     Social History Social History  Substance Use Topics  . Smoking status: Never Smoker  . Smokeless tobacco: Former Neurosurgeon    Types: Snuff  . Alcohol use 0.0 oz/week     Comment: only on special occasions- wine    Review of Systems  Constitutional: No fever. Eyes: No icterus. ENT: No throat pain. Cardiovascular: Denies chest pain. Respiratory: Denies shortness of  breath. Gastrointestinal: Positive for nausea, vomiting, and diarrhea.  Genitourinary: Negative for dysuria.  Musculoskeletal: Negative for back pain. Skin: Negative for rash. Neurological: Negative for headache.   ____________________________________________   PHYSICAL EXAM:  VITAL SIGNS: ED Triage Vitals  Enc Vitals Group     BP --      Pulse --      Resp --      Temp --      Temp src --      SpO2 --      Weight 03/15/17 2124 282 lb (127.9 kg)     Height 03/15/17 2124  (1.651 m)     Head Circumference --      Peak Flow --      Pain Score 03/15/17 2123 10     Pain Loc --      Pain Edu? --  Excl. in GC? --     Constitutional: Alert and oriented. Uncomfortable appearing. Eyes: Conjunctivae are normal. No scleral icterus.  Head: Atraumatic. Nose: No congestion/rhinnorhea. Mouth/Throat: Mucous membranes are dry.   Neck: Normal range of motion.  Cardiovascular:  Good peripheral circulation. Respiratory: Normal respiratory effort.  No retractions.  Gastrointestinal: Soft with mild diffuse discomfort to deep palpation.  No focal tenderness. No distention.  Genitourinary: No CVA tenderness. Musculoskeletal: Extremities warm and well perfused.  Neurologic:  Normal speech and language. No gross focal neurologic deficits are appreciated.  Skin:  Skin is warm and dry. No rash noted. Psychiatric: Mood and affect are normal. Speech and behavior are normal.  ____________________________________________   LABS (all labs ordered are listed, but only abnormal results are displayed)  Labs Reviewed  COMPREHENSIVE METABOLIC PANEL - Abnormal; Notable for the following:       Result Value   Potassium 3.1 (*)    Chloride 100 (*)    Glucose, Bld 183 (*)    Total Protein 9.4 (*)    All other components within normal limits  CBC - Abnormal; Notable for the following:    WBC 13.0 (*)    RBC 5.26 (*)    RDW 15.5 (*)    All other components within normal limits  LIPASE,  BLOOD  URINALYSIS, COMPLETE (UACMP) WITH MICROSCOPIC   ____________________________________________  EKG   ____________________________________________  RADIOLOGY    ____________________________________________   PROCEDURES  Procedure(s) performed: No    Critical Care performed: No ____________________________________________   INITIAL IMPRESSION / ASSESSMENT AND PLAN / ED COURSE  Pertinent labs & imaging results that were available during my care of the patient were reviewed by me and considered in my medical decision making (see chart for details).  51 year old female with history of diabetes, gastritis, gastroparesis presents with acute onset of nausea, vomiting, diarrhea, and diffuse abdominal discomfort for the last 6 hours. Vital signs are normal, patient is uncomfortable but not acutely ill appearing, and exam is as described with mild diffuse abdominal discomfort but no focal tenderness or peritoneal signs. Presentation most consistent with gastroenteritis versus gastroparesis or exacerbation of gastritis. Lower suspicion of hepatobiliary cause. Plan: Labs, fluids, antiemetic, analgesia, and reassess. If persistent pain consider imaging.    ----------------------------------------- 11:23 PM on 03/15/2017 -----------------------------------------  Patient reports persistent relatively severe pain. She states that the symptoms are similar to prior gastroparesis episodes but that the pain is worse. She remains somewhat tender, and she is also hypertensive. We will give additional analgesic, and order CT abdomen.  Patient will be signed out to following shift, Dr. Dolores Frame.   ____________________________________________   FINAL CLINICAL IMPRESSION(S) / ED DIAGNOSES  Final diagnoses:  Nausea vomiting and diarrhea      NEW MEDICATIONS STARTED DURING THIS VISIT:  New Prescriptions   No medications on file     Note:  This document was prepared using Dragon voice  recognition software and may include unintentional dictation errors.    Dionne Bucy, MD 03/15/17 2324

## 2017-03-15 NOTE — ED Triage Notes (Signed)
PT to ED from home reporting NVD that began suddenly 6 hours ago. PT reports constant vomiting and diarrhea since then. PT reports having had a temp of 99.9 at home. Hx pf admission reported with gastritis. Pt denies having eaten new foods or having family or friends with similar symptoms.

## 2017-03-15 NOTE — ED Notes (Signed)
EMS reports having given pt 4 of Zofran in route to hospital.

## 2017-03-15 NOTE — ED Notes (Signed)
2 RNs have attempted IV access twice without success. Two of those attempts were with the ultrasound machine. MD made aware.

## 2017-03-15 NOTE — ED Notes (Signed)
Pt reports continues pain at this time. PT has not had a BM to this point. Pt reports having continues vomiting. Vomit remains green bile.

## 2017-03-16 ENCOUNTER — Emergency Department: Payer: Medicare Other

## 2017-03-16 DIAGNOSIS — E876 Hypokalemia: Secondary | ICD-10-CM | POA: Diagnosis not present

## 2017-03-16 DIAGNOSIS — R112 Nausea with vomiting, unspecified: Secondary | ICD-10-CM | POA: Diagnosis not present

## 2017-03-16 DIAGNOSIS — R1110 Vomiting, unspecified: Secondary | ICD-10-CM | POA: Diagnosis not present

## 2017-03-16 DIAGNOSIS — K3184 Gastroparesis: Secondary | ICD-10-CM | POA: Diagnosis not present

## 2017-03-16 DIAGNOSIS — R1111 Vomiting without nausea: Secondary | ICD-10-CM | POA: Diagnosis not present

## 2017-03-16 DIAGNOSIS — K439 Ventral hernia without obstruction or gangrene: Secondary | ICD-10-CM | POA: Diagnosis not present

## 2017-03-16 LAB — URINALYSIS, COMPLETE (UACMP) WITH MICROSCOPIC
BILIRUBIN URINE: NEGATIVE
Glucose, UA: NEGATIVE mg/dL
KETONES UR: 5 mg/dL — AB
LEUKOCYTES UA: NEGATIVE
NITRITE: NEGATIVE
PH: 7 (ref 5.0–8.0)
Protein, ur: 100 mg/dL — AB
SPECIFIC GRAVITY, URINE: 1.011 (ref 1.005–1.030)

## 2017-03-16 LAB — BASIC METABOLIC PANEL
ANION GAP: 11 (ref 5–15)
BUN: 8 mg/dL (ref 6–20)
CO2: 27 mmol/L (ref 22–32)
Calcium: 9.1 mg/dL (ref 8.9–10.3)
Chloride: 101 mmol/L (ref 101–111)
Creatinine, Ser: 0.75 mg/dL (ref 0.44–1.00)
GFR calc Af Amer: >60 mL/min (ref >60)
GLUCOSE: 160 mg/dL — AB (ref 65–99)
POTASSIUM: 3.2 mmol/L — AB (ref 3.5–5.1)
Sodium: 139 mmol/L (ref 135–145)

## 2017-03-16 LAB — CBC
HEMATOCRIT: 43.2 % (ref 35.0–47.0)
HEMOGLOBIN: 14.3 g/dL (ref 12.0–16.0)
MCH: 27.6 pg (ref 26.0–34.0)
MCHC: 33.1 g/dL (ref 32.0–36.0)
MCV: 83.5 fL (ref 80.0–100.0)
Platelets: 363 10*3/uL (ref 150–440)
RBC: 5.17 MIL/uL (ref 3.80–5.20)
RDW: 15.7 % — ABNORMAL HIGH (ref 11.5–14.5)
WBC: 14.3 10*3/uL — AB (ref 3.6–11.0)

## 2017-03-16 LAB — GLUCOSE, CAPILLARY
GLUCOSE-CAPILLARY: 119 mg/dL — AB (ref 65–99)
Glucose-Capillary: 173 mg/dL — ABNORMAL HIGH (ref 65–99)
Glucose-Capillary: 116 mg/dL — ABNORMAL HIGH (ref 65–99)
Glucose-Capillary: 109 mg/dL — ABNORMAL HIGH (ref 65–99)

## 2017-03-16 MED ORDER — ADULT MULTIVITAMIN W/MINERALS CH
1.0000 | ORAL_TABLET | Freq: Every day | ORAL | Status: DC
  Administered 2017-03-17 – 2017-03-18 (×2): 1 via ORAL
  Filled 2017-03-16 (×2): qty 1

## 2017-03-16 MED ORDER — OXYCODONE HCL 5 MG PO TABS
10.0000 mg | ORAL_TABLET | ORAL | Status: DC | PRN
  Administered 2017-03-16 – 2017-03-18 (×6): 10 mg via ORAL
  Filled 2017-03-16 (×6): qty 2

## 2017-03-16 MED ORDER — HYDROMORPHONE HCL 1 MG/ML IJ SOLN
1.0000 mg | INTRAMUSCULAR | Status: DC | PRN
  Administered 2017-03-16 (×3): 1 mg via INTRAVENOUS
  Filled 2017-03-16 (×3): qty 1

## 2017-03-16 MED ORDER — VITAMIN D 1000 UNITS PO TABS
1000.0000 [IU] | ORAL_TABLET | Freq: Every day | ORAL | Status: DC
  Administered 2017-03-16 – 2017-03-18 (×3): 1000 [IU] via ORAL
  Filled 2017-03-16 (×3): qty 1

## 2017-03-16 MED ORDER — ALBUTEROL SULFATE (2.5 MG/3ML) 0.083% IN NEBU: 2.5000 mg | INHALATION_SOLUTION | Freq: Four times a day (QID) | RESPIRATORY_TRACT | Status: DC | PRN

## 2017-03-16 MED ORDER — PROMETHAZINE HCL 25 MG/ML IJ SOLN
12.5000 mg | Freq: Once | INTRAMUSCULAR | Status: DC
  Filled 2017-03-16: qty 1

## 2017-03-16 MED ORDER — AMLODIPINE BESYLATE 5 MG PO TABS
5.0000 mg | ORAL_TABLET | Freq: Every day | ORAL | Status: DC
  Administered 2017-03-16: 14:00:00 5 mg via ORAL
  Filled 2017-03-16: qty 1

## 2017-03-16 MED ORDER — ALPRAZOLAM 0.5 MG PO TABS: 0.5000 mg | ORAL_TABLET | Freq: Three times a day (TID) | ORAL | Status: DC | PRN

## 2017-03-16 MED ORDER — VITAMIN B-12 1000 MCG PO TABS
1000.0000 ug | ORAL_TABLET | Freq: Every day | ORAL | Status: DC
  Administered 2017-03-16 – 2017-03-18 (×3): 1000 ug via ORAL
  Filled 2017-03-16 (×3): qty 1

## 2017-03-16 MED ORDER — ENOXAPARIN SODIUM 40 MG/0.4ML ~~LOC~~ SOLN
40.0000 mg | Freq: Two times a day (BID) | SUBCUTANEOUS | Status: DC
  Administered 2017-03-16 – 2017-03-18 (×5): 40 mg via SUBCUTANEOUS
  Filled 2017-03-16 (×5): qty 0.4

## 2017-03-16 MED ORDER — LISINOPRIL 20 MG PO TABS: 30.0000 mg | ORAL_TABLET | Freq: Every day | ORAL | Status: DC

## 2017-03-16 MED ORDER — PROMETHAZINE HCL 25 MG PO TABS
12.5000 mg | ORAL_TABLET | Freq: Four times a day (QID) | ORAL | Status: DC | PRN
  Administered 2017-03-16 – 2017-03-17 (×3): 12.5 mg via ORAL
  Filled 2017-03-16 (×5): qty 1

## 2017-03-16 MED ORDER — FAMOTIDINE IN NACL 20-0.9 MG/50ML-% IV SOLN
20.0000 mg | Freq: Once | INTRAVENOUS | Status: AC
  Administered 2017-03-16: 20 mg via INTRAVENOUS
  Filled 2017-03-16: qty 50

## 2017-03-16 MED ORDER — PANTOPRAZOLE SODIUM 40 MG IV SOLR
40.0000 mg | INTRAVENOUS | Status: DC
Start: 2017-03-16 — End: 2017-03-16
  Administered 2017-03-16: 40 mg via INTRAVENOUS
  Filled 2017-03-16: qty 40

## 2017-03-16 MED ORDER — LISINOPRIL 10 MG PO TABS
30.0000 mg | ORAL_TABLET | Freq: Every day | ORAL | Status: DC
  Administered 2017-03-16 – 2017-03-17 (×2): 30 mg via ORAL
  Filled 2017-03-16 (×4): qty 1

## 2017-03-16 MED ORDER — TRAZODONE HCL 50 MG PO TABS
100.0000 mg | ORAL_TABLET | Freq: Every day | ORAL | Status: DC
  Administered 2017-03-16 – 2017-03-17 (×2): 100 mg via ORAL
  Filled 2017-03-16 (×2): qty 2

## 2017-03-16 MED ORDER — PROMETHAZINE HCL 25 MG/ML IJ SOLN
25.0000 mg | Freq: Once | INTRAMUSCULAR | Status: AC
  Administered 2017-03-16: 25 mg via INTRAMUSCULAR

## 2017-03-16 MED ORDER — ALPRAZOLAM 1 MG PO TABS
2.0000 mg | ORAL_TABLET | Freq: Three times a day (TID) | ORAL | Status: DC | PRN
  Administered 2017-03-16: 08:00:00 2 mg via ORAL
  Filled 2017-03-16: qty 2

## 2017-03-16 MED ORDER — TIZANIDINE HCL 4 MG PO TABS
4.0000 mg | ORAL_TABLET | Freq: Three times a day (TID) | ORAL | Status: DC
  Administered 2017-03-16 – 2017-03-18 (×6): 4 mg via ORAL
  Filled 2017-03-16 (×8): qty 1

## 2017-03-16 MED ORDER — INSULIN ASPART 100 UNIT/ML ~~LOC~~ SOLN
0.0000 [IU] | Freq: Three times a day (TID) | SUBCUTANEOUS | Status: DC
  Administered 2017-03-16: 3 [IU] via SUBCUTANEOUS
  Administered 2017-03-18: 12:00:00 2 [IU] via SUBCUTANEOUS
  Filled 2017-03-16 (×2): qty 1

## 2017-03-16 MED ORDER — AMLODIPINE BESYLATE 5 MG PO TABS
5.0000 mg | ORAL_TABLET | Freq: Every day | ORAL | Status: DC
  Administered 2017-03-17: 09:00:00 5 mg via ORAL
  Filled 2017-03-16 (×2): qty 1

## 2017-03-16 MED ORDER — FUROSEMIDE 20 MG PO TABS: 20.0000 mg | ORAL_TABLET | Freq: Every day | ORAL | Status: DC | PRN

## 2017-03-16 MED ORDER — HYDROMORPHONE HCL 1 MG/ML IJ SOLN
1.0000 mg | Freq: Once | INTRAMUSCULAR | Status: AC
  Administered 2017-03-16: 1 mg via INTRAVENOUS
  Filled 2017-03-16: qty 1

## 2017-03-16 MED ORDER — PREMIER PROTEIN SHAKE
11.0000 [oz_av] | Freq: Three times a day (TID) | ORAL | Status: DC
  Administered 2017-03-16 – 2017-03-18 (×4): 11 [oz_av] via ORAL

## 2017-03-16 MED ORDER — POTASSIUM CHLORIDE IN NACL 20-0.9 MEQ/L-% IV SOLN
INTRAVENOUS | Status: DC
  Administered 2017-03-16 – 2017-03-17 (×3): via INTRAVENOUS
  Filled 2017-03-16 (×5): qty 1000

## 2017-03-16 MED ORDER — INSULIN ASPART 100 UNIT/ML ~~LOC~~ SOLN: 0.0000 [IU] | Freq: Every day | SUBCUTANEOUS | Status: DC

## 2017-03-16 MED ORDER — OXYCODONE HCL 10 MG PO TABS: 10.0000 mg | ORAL_TABLET | ORAL | Status: DC

## 2017-03-16 MED ORDER — LAMOTRIGINE 100 MG PO TABS
100.0000 mg | ORAL_TABLET | Freq: Every day | ORAL | Status: DC
  Administered 2017-03-16 – 2017-03-18 (×3): 100 mg via ORAL
  Filled 2017-03-16 (×3): qty 1

## 2017-03-16 MED ORDER — FLUOXETINE HCL 20 MG PO CAPS
20.0000 mg | ORAL_CAPSULE | Freq: Every day | ORAL | Status: DC
  Administered 2017-03-16 – 2017-03-18 (×3): 20 mg via ORAL
  Filled 2017-03-16 (×3): qty 1

## 2017-03-16 MED ORDER — ONDANSETRON HCL 4 MG/2ML IJ SOLN
4.0000 mg | Freq: Four times a day (QID) | INTRAMUSCULAR | Status: DC | PRN
  Administered 2017-03-16: 4 mg via INTRAVENOUS
  Filled 2017-03-16: qty 2

## 2017-03-16 MED ORDER — HYDRALAZINE HCL 20 MG/ML IJ SOLN
10.0000 mg | Freq: Once | INTRAMUSCULAR | Status: AC
  Administered 2017-03-16: 10 mg via INTRAVENOUS
  Filled 2017-03-16: qty 1

## 2017-03-16 MED ORDER — METOPROLOL SUCCINATE ER 50 MG PO TB24
50.0000 mg | ORAL_TABLET | Freq: Every day | ORAL | Status: DC
  Administered 2017-03-16 – 2017-03-17 (×2): 50 mg via ORAL
  Filled 2017-03-16 (×3): qty 1

## 2017-03-16 MED ORDER — PANTOPRAZOLE SODIUM 40 MG PO TBEC
40.0000 mg | DELAYED_RELEASE_TABLET | Freq: Two times a day (BID) | ORAL | Status: DC
  Administered 2017-03-16 – 2017-03-18 (×4): 40 mg via ORAL
  Filled 2017-03-16 (×4): qty 1

## 2017-03-16 MED ORDER — FAMOTIDINE 20 MG PO TABS
10.0000 mg | ORAL_TABLET | Freq: Every day | ORAL | Status: DC
  Administered 2017-03-16 – 2017-03-18 (×3): 10 mg via ORAL
  Filled 2017-03-16 (×4): qty 1

## 2017-03-16 MED ORDER — HYDRALAZINE HCL 20 MG/ML IJ SOLN: 10.0000 mg | INTRAMUSCULAR | Status: DC | PRN

## 2017-03-16 MED ORDER — IOPAMIDOL (ISOVUE-300) INJECTION 61%: 125.0000 mL | Freq: Once | INTRAVENOUS | Status: DC | PRN

## 2017-03-16 MED ORDER — ALPRAZOLAM 1 MG PO TABS
2.0000 mg | ORAL_TABLET | Freq: Three times a day (TID) | ORAL | Status: DC | PRN
  Administered 2017-03-16 – 2017-03-17 (×3): 2 mg via ORAL
  Filled 2017-03-16 (×4): qty 2

## 2017-03-16 MED ORDER — ONDANSETRON HCL 4 MG PO TABS
4.0000 mg | ORAL_TABLET | Freq: Four times a day (QID) | ORAL | Status: DC | PRN
  Administered 2017-03-17: 16:00:00 4 mg via ORAL
  Filled 2017-03-16: qty 1

## 2017-03-16 MED ORDER — HYDRALAZINE HCL 20 MG/ML IJ SOLN
20.0000 mg | Freq: Once | INTRAMUSCULAR | Status: AC
  Administered 2017-03-16: 20 mg via INTRAVENOUS
  Filled 2017-03-16: qty 1

## 2017-03-16 MED ORDER — CLONIDINE HCL 0.1 MG/24HR TD PTWK
0.1000 mg | MEDICATED_PATCH | TRANSDERMAL | Status: DC
  Filled 2017-03-16: qty 1

## 2017-03-16 NOTE — Progress Notes (Signed)
Lovenox changed to 40 mg BID for BMI >40 and CrCl >30. 

## 2017-03-16 NOTE — ED Notes (Signed)
Pt went to CT

## 2017-03-16 NOTE — H&P (Signed)
[facility] - [place] at [facility]   PATIENT NAME: Daisy [name]    MR#:  [id number]  DATE OF BIRTH:  1965/07/15  DATE OF ADMISSION:  03/15/2017  PRIMARY CARE PHYSICIAN: Malva Limes, MD   REQUESTING/REFERRING PHYSICIAN:   CHIEF COMPLAINT:   Chief Complaint  Patient presents with  . Emesis  . Diarrhea    HISTORY OF PRESENT ILLNESS: Daisy [name]  is a 51 y.o. female with a known history of Diabetes mellitus type 2, gastroparesis, abdominal hernia, hypertension presented to the emergency room with nausea and vomiting since one day. Vomitus contained food and water. Patient also has abdominal discomfort is aching in nature around the umbilicus. The abdominal discomfort is 5 out of 10 on a scale of 1-10. Patient has history of abdominal ventral hernia. She was worked up with CT abdomen in the emergency room which showed no obstruction. Patient's potassium level was low and she appeared dry and dehydrated. She is not able to eat food and drink any fluids because of the nausea and vomiting. No complaints of any chest pain, shortness of breath.  PAST MEDICAL HISTORY:   Past Medical History:  Diagnosis Date  . Diabetes mellitus without complication (HCC)   . Gastritis   . Gastroparesis   . Hernia of abdominal wall   . Hypertension   . Migraine     PAST SURGICAL HISTORY: Past Surgical History:  Procedure Laterality Date  . ABDOMINAL HYSTERECTOMY    . APPENDECTOMY    . APPLICATION OF WOUND VAC    . CHOLECYSTECTOMY    . HERNIA REPAIR    . KNEE SURGERY      SOCIAL HISTORY:  Social History  Substance Use Topics  . Smoking status: Never Smoker  . Smokeless tobacco: Former Neurosurgeon    Types: Snuff  . Alcohol use 0.0 oz/week     Comment: only on special occasions- wine    FAMILY HISTORY:  Family History  Problem Relation Age of Onset  . Arthritis Mother   . Depression Mother   . Diabetes Mother   . Heart disease Mother   . Hyperlipidemia Mother    . Hypertension Mother   . Stroke Mother   . Arthritis Father   . Diabetes Father   . Hearing loss Father   . Heart disease Father   . Hyperlipidemia Father   . Hypertension Father     DRUG ALLERGIES:  Allergies  Allergen Reactions  . Acetaminophen-Codeine Hives  . Codeine Hives  . Ibuprofen Nausea And Vomiting  . Morphine Nausea And Vomiting  . Propoxyphene Other (See Comments)    Reaction:  GI upset   . Sulfa Antibiotics Hives  . Duloxetine Rash and Other (See Comments)    Yellowing of the skin    REVIEW OF SYSTEMS:   CONSTITUTIONAL: No fever, fatigue or weakness.  EYES: No blurred or double vision.  EARS, NOSE, AND THROAT: No tinnitus or ear pain.  RESPIRATORY: No cough, shortness of breath, wheezing or hemoptysis.  CARDIOVASCULAR: No chest pain, orthopnea, edema.  GASTROINTESTINAL: Has nausea, vomiting, diarrhea and abdominal pain.  GENITOURINARY: No dysuria, hematuria.  ENDOCRINE: No polyuria, nocturia,  HEMATOLOGY: No anemia, easy bruising or bleeding SKIN: No rash or lesion. MUSCULOSKELETAL: No joint pain or arthritis.   NEUROLOGIC: No tingling, numbness, weakness.  PSYCHIATRY: No anxiety or depression.   MEDICATIONS AT HOME:  Prior to Admission medications   Medication Sig Start Date End Date Taking? Authorizing Provider  albuterol (PROVENTIL HFA;VENTOLIN  HFA) 108 (90 Base) MCG/ACT inhaler Inhale 2 puffs into the lungs every 6 (six) hours as needed for wheezing or shortness of breath. 02/06/17 03/08/17  Malva Limes, MD  alprazolam Prudy Feeler) 2 MG tablet Take 2 mg by mouth 3 (three) times daily as needed for anxiety.     [provider]  amLODipine (NORVASC) 5 MG tablet Take 1 tablet (5 mg total) by mouth daily. 06/18/16   Malva Limes, MD  cholecalciferol (VITAMIN D) 1000 units tablet Take 1,000 Units by mouth daily.    [provider]  cloNIDine (CATAPRES - DOSED IN MG/24 HR) 0.1 mg/24hr patch PLACE 1 PATCH (0.1 MG TOTAL) ONTO THE SKIN ONCE  A WEEK. 11/30/16   Malva Limes, MD  FLUoxetine (PROZAC) 20 MG capsule Take 20 mg by mouth daily.    [provider]  furosemide (LASIX) 20 MG tablet TAKE 1 TABLET (20 MG TOTAL) BY MOUTH DAILY AS NEEDED FOR EDEMA. 01/13/17   Malva Limes, MD  KLOR-CON M20 20 MEQ tablet TAKE 1 TABLET (20 MEQ TOTAL) BY MOUTH DAILY. TAKE WITH FUROSEMIDE 01/13/17   Malva Limes, MD  lamoTRIgine (LAMICTAL) 100 MG tablet Take 100 mg by mouth daily.    [provider]  lisinopril (PRINIVIL,ZESTRIL) 20 MG tablet Take 1.5 tablets (30 mg total) by mouth daily. Patient taking differently: Take 20 mg by mouth daily.  11/10/16   Malva Limes, MD  metFORMIN (GLUCOPHAGE) 500 MG tablet Take 1 tablet (500 mg total) by mouth daily. 06/18/16   Malva Limes, MD  metoprolol succinate (TOPROL-XL) 25 MG 24 hr tablet Take 2 tablets (50 mg total) by mouth daily. 06/18/16   Malva Limes, MD  ondansetron (ZOFRAN) 4 MG tablet Take 4 mg by mouth every 8 (eight) hours as needed for nausea.    [provider]  Oxycodone HCl 10 MG TABS Take 10 mg by mouth every 4 (four) hours as needed. Take 4-7 tablets daily 05/31/16   [provider]  pantoprazole (PROTONIX) 40 MG tablet Take 1 tablet (40 mg total) by mouth 2 (two) times daily. 11/10/16   Malva Limes, MD  promethazine (PHENERGAN) 12.5 MG tablet Take 1 tablet (12.5 mg total) by mouth every 6 (six) hours as needed for refractory nausea / vomiting. 03/04/17   Malva Limes, MD  sucralfate (CARAFATE) 1 g tablet Take 1 tablet (1 g total) by mouth 4 (four) times daily. 03/26/16   Altamese Dilling, MD  tiZANidine (ZANAFLEX) 4 MG tablet Take 4-12 mg by mouth 3 (three) times daily. 1-3 tablets per day as tolerated    [provider]  traZODone (DESYREL) 100 MG tablet Take 100 mg by mouth at bedtime.  10/12/16   [provider]  vitamin B-12 (CYANOCOBALAMIN) 1000 MCG tablet Take 1,000 mcg by mouth daily.    [provider]  zolpidem (AMBIEN) 10 MG tablet Take 10 mg by mouth at bedtime.    [provider]      PHYSICAL EXAMINATION:   VITAL SIGNS: Blood pressure (!) 192/106, pulse 91, temperature 98.4 F (36.9 C), temperature source Oral, resp. rate (!) 57, height  (1.651 m), weight 127.9 kg (282 lb), SpO2 96 %.  GENERAL:  51 y.o.-year-old patient lying in the bed with no acute distress.  EYES: Pupils equal, round, reactive to light and accommodation. No scleral icterus. Extraocular muscles intact.  HEENT: Head atraumatic, normocephalic. Oropharynx dry and nasopharynx clear.  NECK:  Supple,  no jugular venous distention. No thyroid enlargement, no tenderness.  LUNGS: Normal breath sounds bilaterally, no wheezing, rales,rhonchi or crepitation. No use of accessory muscles of respiration.  CARDIOVASCULAR: S1, S2 normal. No murmurs, rubs, or gallops.  ABDOMEN: Soft, tenderness around umbilicus, nondistended. Bowel sounds present.  Abdominal hernia felt. EXTREMITIES: No pedal edema, cyanosis, or clubbing.  NEUROLOGIC: Cranial nerves II through XII are intact. Muscle strength 5/5 in all extremities. Sensation intact. Gait not checked.  PSYCHIATRIC: The patient is alert and oriented x 3.  SKIN: No obvious rash, lesion, or ulcer.   LABORATORY PANEL:   CBC  Recent Labs Lab 03/15/17 2219  WBC 13.0*  HGB 14.6  HCT 44.2  PLT 371  MCV 84.1  MCH 27.8  MCHC 33.1  RDW 15.5*   ------------------------------------------------------------------------------------------------------------------  Chemistries   Recent Labs Lab 03/15/17 2219  NA 140  K 3.1*  CL 100*  CO2 26  GLUCOSE 183*  BUN 8  CREATININE 0.76  CALCIUM 9.6  AST 22  ALT 14  ALKPHOS 56  BILITOT 0.7   ------------------------------------------------------------------------------------------------------------------ estimated creatinine clearance is 114.7 mL/min (by C-G formula based on SCr of 0.76  mg/dL). ------------------------------------------------------------------------------------------------------------------ No results for input(s): TSH, T4TOTAL, T3FREE, THYROIDAB in the last 72 hours.  Invalid input(s): FREET3   Coagulation profile No results for input(s): INR, PROTIME in the last 168 hours. ------------------------------------------------------------------------------------------------------------------- No results for input(s): DDIMER in the last 72 hours. -------------------------------------------------------------------------------------------------------------------  Cardiac Enzymes No results for input(s): CKMB, TROPONINI, MYOGLOBIN in the last 168 hours.  Invalid input(s): CK ------------------------------------------------------------------------------------------------------------------ Invalid input(s): POCBNP  ---------------------------------------------------------------------------------------------------------------  Urinalysis    Component Value Date/Time   COLORURINE YELLOW (A) 03/15/2017 2219   APPEARANCEUR CLEAR (A) 03/15/2017 2219   APPEARANCEUR CLEAR 12/10/2013 0745   LABSPEC 1.011 03/15/2017 2219   LABSPEC 1.015 12/10/2013 0745   PHURINE 7.0 03/15/2017 2219   GLUCOSEU NEGATIVE 03/15/2017 2219   GLUCOSEU NEGATIVE 12/10/2013 0745   HGBUR MODERATE (A) 03/15/2017 2219   BILIRUBINUR NEGATIVE 03/15/2017 2219   BILIRUBINUR negative 11/12/2016 1207   BILIRUBINUR NEGATIVE 12/10/2013 0745   KETONESUR 5 (A) 03/15/2017 2219   PROTEINUR 100 (A) 03/15/2017 2219   UROBILINOGEN 0.2 11/12/2016 1207   NITRITE NEGATIVE 03/15/2017 2219   LEUKOCYTESUR NEGATIVE 03/15/2017 2219   LEUKOCYTESUR NEGATIVE 12/10/2013 0745     RADIOLOGY: Ct Abdomen Pelvis Wo Contrast  Result Date: 03/16/2017 CLINICAL DATA:  Nausea, vomiting and diarrhea beginning yesterday, diffuse abdominal pain. History of gastritis, diabetes and gastroparesis, abdominal wall hernia.  History of cholecystectomy, appendectomy, hysterectomy, hernia repair. EXAM: CT ABDOMEN AND PELVIS WITHOUT CONTRAST TECHNIQUE: Multidetector CT imaging of the abdomen and pelvis was performed following the standard protocol without IV contrast. COMPARISON:  CT abdomen and pelvis January 03, 2017 FINDINGS: Large body habitus results in overall noisy image quality. LOWER CHEST: Lung bases are clear. The visualized heart size is normal. No pericardial effusion. HEPATOBILIARY: Status post cholecystectomy. Normal noncontrast CT liver. PANCREAS: Normal. SPLEEN: Normal. ADRENALS/URINARY TRACT: Kidneys are orthotopic, demonstrating normal size and morphology. No nephrolithiasis, hydronephrosis; limited assessment for renal masses on this nonenhanced examination. The unopacified ureters are normal in course and caliber. Urinary bladder is partially distended and unremarkable. Normal adrenal glands. STOMACH/BOWEL: Small to moderate hiatal hernia. The stomach, small and large bowel are normal in caliber without inflammatory changes, sensitivity decreased by lack of enteric contrast. Multiple loops of small bowel courses into ventral hernia sac without caliber change or inflammatory process. VASCULAR/LYMPHATIC: Aortoiliac vessels are normal in course and caliber. No lymphadenopathy by  CT size criteria. Stable mild "misty" mesentery. REPRODUCTIVE: Status post hysterectomy. OTHER: No intraperitoneal free fluid or free air. Phleboliths and granulomas in the pelvis. 2.7 cm probable corpus luteal cyst LEFT adnexae, no routine follow-up recommendations. MUSCULOSKELETAL: Non-acute. 4.1 cm RIGHT supraumbilical ventral hernia again noted, associated anterior abdominal wall scarring. Severe lower lumbar facet arthropathy. Moderate sacroiliac osteoarthrosis. IMPRESSION: 1. No nephrolithiasis, hydronephrosis or acute intra-abdominal/pelvic process. 2. Stable wide necked RIGHT ventral hernia containing small bowel without CT findings of  incarceration or strangulation. Electronically Signed   By: Awilda Metro M.D.   On: 03/16/2017 02:56    EKG: Orders placed or performed during the hospital encounter of 01/03/17  . EKG 12-Lead  . EKG 12-Lead    IMPRESSION AND PLAN: 51 year old female patient with history of type 2 diabetes mellitus, gastroparesis, hypertension, abdominal hernia presented to the emergency room with nausea and vomiting and abdominal discomfort.  Admitting diagnosis 1. Gastroparesis 2. Nausea and vomiting 3. Hypokalemia 4. Ventral hernia 5. Abdominal discomfort Treatment plan Admit patient to medical floor IV fluid hydration Potassium supplementation Pain management with IV Dilaudid Antiemetics  All the records are reviewed and case discussed with ED provider. Management plans discussed with the patient, family and they are in agreement.  CODE STATUS:FULL CODE Code Status History    Date Active Date Inactive Code Status Order ID Comments User Context   01/03/2017  8:24 AM 01/05/2017  3:57 PM Full Code [id number]  Ihor Austin, MD Inpatient   03/23/2016  6:29 AM 03/26/2016  4:51 PM Full Code [id number]  Arnaldo Natal, MD Inpatient   09/28/2015  4:54 AM 09/29/2015  5:20 PM Full Code [id number]  Ihor Austin, MD ED   05/05/2015  8:36 PM 05/09/2015  8:06 PM Full Code [id number]  Hower, Cletis Athens, MD ED   01/07/2015  7:11 PM 01/09/2015  6:55 PM Full Code [id number]  Milagros Loll, MD ED       TOTAL TIME TAKING CARE OF THIS PATIENT: 50 minutes.    Ihor Austin M.D on 03/16/2017 at 3:47 AM  Between 7am to 6pm - Pager - [phone]  After 6pm go to [email or web address] - password [email or web address] [facility]  [facility]  Office  [phone]  CC: Primary care physician; Malva Limes, MD

## 2017-03-16 NOTE — ED Provider Notes (Signed)
-----------------------------------------   1:08 AM on 03/16/2017 -----------------------------------------  Patient has not been able to tolerate oral contrast secondary to persistent nausea and vomiting. Blood pressure remains elevated despite labetalol. Will administer Dilaudid with Phenergan which is what patient states usually helps her symptoms. Will proceed with CT scan.  ----------------------------------------- 3:11 AM on 03/16/2017 -----------------------------------------  CT abdomen/pelvis interpreted per Dr. Karie Kirks:  1. No nephrolithiasis, hydronephrosis or acute  intra-abdominal/pelvic process.  2. Stable wide necked RIGHT ventral hernia containing small bowel  without CT findings of incarceration or strangulation.   Patient remains nauseated, clutching emesis bag. Still complains of pain. Blood pressure remains elevated. Will discuss with hospitalist to evaluate patient in the emergency department for gastroparesis and hypertensive urgency.   Irean Hong, MD 03/16/17 [phone]

## 2017-03-16 NOTE — Progress Notes (Signed)
[facility] [place] at [facility]   PATIENT NAME: Daisy [name]    MR#:  [id number]  DATE OF BIRTH:  02/01/1966  SUBJECTIVE:  Came in with nausea and vomiting. Patient having some bilious vomiting  REVIEW OF SYSTEMS:   Review of Systems  Constitutional: Negative for chills, fever and weight loss.  HENT: Negative for ear discharge, ear pain and nosebleeds.   Eyes: Negative for blurred vision, pain and discharge.  Respiratory: Negative for sputum production, shortness of breath, wheezing and stridor.   Cardiovascular: Negative for chest pain, palpitations, orthopnea and PND.  Gastrointestinal: Positive for abdominal pain, nausea and vomiting. Negative for diarrhea.  Genitourinary: Negative for frequency and urgency.  Musculoskeletal: Negative for back pain and joint pain.  Neurological: Positive for weakness. Negative for sensory change, speech change and focal weakness.  Psychiatric/Behavioral: Negative for depression and hallucinations. The patient is not nervous/anxious.    Tolerating Diet: Clear liquid Tolerating PT: Ambulatory  DRUG ALLERGIES:   Allergies  Allergen Reactions  . Acetaminophen-Codeine Hives  . Codeine Hives  . Ibuprofen Nausea And Vomiting  . Morphine Nausea And Vomiting  . Propoxyphene Other (See Comments)    Reaction:  GI upset   . Sulfa Antibiotics Hives  . Duloxetine Rash and Other (See Comments)    Yellowing of the skin    VITALS:  Blood pressure 139/77, pulse 89, temperature 98.1 F (36.7 C), temperature source Oral, resp. rate 18, height  (1.651 m), weight 127.9 kg (282 lb), SpO2 95 %.  PHYSICAL EXAMINATION:   Physical Exam  GENERAL:  51 y.o.-year-old patient lying in the bed with no acute distress. Obese EYES: Pupils equal, round, reactive to light and accommodation. No scleral icterus. Extraocular muscles intact.  HEENT: Head atraumatic, normocephalic. Oropharynx and nasopharynx clear.  NECK:  Supple, no  jugular venous distention. No thyroid enlargement, no tenderness.  LUNGS: Normal breath sounds bilaterally, no wheezing, rales, rhonchi. No use of accessory muscles of respiration.  CARDIOVASCULAR: S1, S2 normal. No murmurs, rubs, or gallops.  ABDOMEN: Soft, nontender, nondistended. Bowel sounds present. No organomegaly or mass.  EXTREMITIES: No cyanosis, clubbing or edema b/l.    NEUROLOGIC: Cranial nerves II through XII are intact. No focal Motor or sensory deficits b/l.   PSYCHIATRIC:  patient is alert and oriented x 3.  SKIN: No obvious rash, lesion, or ulcer.   LABORATORY PANEL:  CBC  Recent Labs Lab 03/16/17 0655  WBC 14.3*  HGB 14.3  HCT 43.2  PLT 363    Chemistries   Recent Labs Lab 03/15/17 2219 03/16/17 0655  NA 140 139  K 3.1* 3.2*  CL 100* 101  CO2 26 27  GLUCOSE 183* 160*  BUN 8 8  CREATININE 0.76 0.75  CALCIUM 9.6 9.1  AST 22  --   ALT 14  --   ALKPHOS 56  --   BILITOT 0.7  --    Cardiac Enzymes No results for input(s): TROPONINI in the last 168 hours. RADIOLOGY:  Ct Abdomen Pelvis Wo Contrast  Result Date: 03/16/2017 CLINICAL DATA:  Nausea, vomiting and diarrhea beginning yesterday, diffuse abdominal pain. History of gastritis, diabetes and gastroparesis, abdominal wall hernia. History of cholecystectomy, appendectomy, hysterectomy, hernia repair. EXAM: CT ABDOMEN AND PELVIS WITHOUT CONTRAST TECHNIQUE: Multidetector CT imaging of the abdomen and pelvis was performed following the standard protocol without IV contrast. COMPARISON:  CT abdomen and pelvis January 03, 2017 FINDINGS: Large body habitus results in overall noisy image quality. LOWER CHEST: Lung  bases are clear. The visualized heart size is normal. No pericardial effusion. HEPATOBILIARY: Status post cholecystectomy. Normal noncontrast CT liver. PANCREAS: Normal. SPLEEN: Normal. ADRENALS/URINARY TRACT: Kidneys are orthotopic, demonstrating normal size and morphology. No nephrolithiasis, hydronephrosis;  limited assessment for renal masses on this nonenhanced examination. The unopacified ureters are normal in course and caliber. Urinary bladder is partially distended and unremarkable. Normal adrenal glands. STOMACH/BOWEL: Small to moderate hiatal hernia. The stomach, small and large bowel are normal in caliber without inflammatory changes, sensitivity decreased by lack of enteric contrast. Multiple loops of small bowel courses into ventral hernia sac without caliber change or inflammatory process. VASCULAR/LYMPHATIC: Aortoiliac vessels are normal in course and caliber. No lymphadenopathy by CT size criteria. Stable mild "misty" mesentery. REPRODUCTIVE: Status post hysterectomy. OTHER: No intraperitoneal free fluid or free air. Phleboliths and granulomas in the pelvis. 2.7 cm probable corpus luteal cyst LEFT adnexae, no routine follow-up recommendations. MUSCULOSKELETAL: Non-acute. 4.1 cm RIGHT supraumbilical ventral hernia again noted, associated anterior abdominal wall scarring. Severe lower lumbar facet arthropathy. Moderate sacroiliac osteoarthrosis. IMPRESSION: 1. No nephrolithiasis, hydronephrosis or acute intra-abdominal/pelvic process. 2. Stable wide necked RIGHT ventral hernia containing small bowel without CT findings of incarceration or strangulation. Electronically Signed   By: Awilda Metro M.D.   On: 03/16/2017 02:56   ASSESSMENT AND PLAN:  Daisy [name]  is a 51 y.o. female with a known history of Diabetes mellitus type 2, gastroparesis, abdominal hernia, hypertension presented to the emergency room with nausea and vomiting since one day. Vomitus contained food and water. Patient also has abdominal discomfort is aching in nature around the umbilicus  1. Nausea vomiting secondary to recurrent gastroparesis -CLD -IVF -prn phenergan, when necessary Zofran  2. Chronic abdominal pain/chronic pain syndrome -Verified patient's oxycodone -She follows with Dr. crisp at the pain clinic  3.  Morbid obesity  4. Type 2 diabetes -Sliding scale insulin  5. Hypertension continue home meds   Case discussed with Care Management/Social Worker. Management plans discussed with the patient, family and they are in agreement.  CODE STATUS: Full  DVT Prophylaxis: Lovenox  TOTAL TIME TAKING CARE OF THIS PATIENT: *30* minutes.  >50% time spent on counselling and coordination of care  POSSIBLE D/C IN *1-2* DAYS, DEPENDING ON CLINICAL CONDITION.  Note: This dictation was prepared with Dragon dictation along with smaller phrase technology. Any transcriptional errors that result from this process are unintentional.  Nasra Counce M.D on 03/16/2017 at 2:39 PM  Between 7am to 6pm - Pager - [phone]  After 6pm go to [email or web address] - password [email or web address]  [facility]  Office  [phone]  CC: Primary care physician; Malva Limes, MDPatient ID: Juanetta Gosling, female   DOB: 07-Oct-1965, 51 y.o.   MRN: [id number]

## 2017-03-16 NOTE — ED Notes (Signed)
Kyndle Schlender RN helped the Pt go to the bedside commode to void. Pt returned to her room without difficulty.

## 2017-03-16 NOTE — Progress Notes (Addendum)
Initial Nutrition Assessment  DOCUMENTATION CODES:   Morbid obesity  INTERVENTION:  Provide Premier Protein po TID, each supplement provides 160 kcal and 30 grams of protein.  Reviewed "Gastroparesis Nutrition Therapy" from the Academy of Nutrition and Dietetics with patient. Encouraged intake of foods that will be well-tolerated with gastroparesis. Encouraged intake of small, frequent meals. Discussed that liquids may be better tolerated than solids.  Recommend daily multivitamin with minerals.  NUTRITION DIAGNOSIS:   Inadequate oral intake related to nausea, vomiting, chronic illness (recurrent gastroparesis) as evidenced by per patient/family report.  GOAL:   Patient will meet greater than or equal to 90% of their needs  MONITOR:   PO intake, Supplement acceptance, Labs, Weight trends, I & O's  REASON FOR ASSESSMENT:   Malnutrition Screening Tool    ASSESSMENT:   51 year old female with PMHx of DM type 2, gastroparesis, hernia s/p hernia repair, gastritis, HTN who presented with nausea, vomiting, and abdominal discomfort secondary to recurrent gastroparesis per chart.   Spoke with patient at bedside. She reports she has had abdominal symptoms since 2016. She is unsure exactly when she was diagnosed with gastroparesis. Her current symptoms initiated one day prior to admission. She reports she ate lunch today, but then had an episode of emesis afterwards. She reports that her intake varies depending on her symptoms. When she is feeling well, she is able to eat well (at least 3 meals daily). When she has symptoms, she cannot usually even tolerate liquids very well. Patient reports she is undergoing w/u for planned weight loss surgery with [facility] (date not set yet). She reports they would like her to have gastric bypass, but she feels more comfortable with sleeve gastrectomy because she does not want to lose too much weight. She reports the plan is to repair her recurrent  hernia at the same time as her WLS.  Patient reports her UBW is 300 lbs. Per review of weight history in chart she was 290.8 lbs on 01/18/2017 and 282.4 lbs on 03/09/2017. That is a weight loss of 8.4 lbs (2.9% body weight) over 1 month, which is not significant for time frame.  Meal Completion: 100% of lunch today per chart (grilled cheese, tomato soup, diet Gingerale; only 401 kcal and 18 grams of protein); patient had emesis directly after lunch; per [employer] did not order breakfast this morning and has not yet ordered dinner tonight  Medications reviewed and include: vitamin D 1000 units daily, famotidine, Novolog 0-15 units TID, Novolog 0-5 units QHS, pantoprazole, vitamin B12 1000 micrograms daily, NS with KCl 20 mEq/L at 75 ml/hr. Xanax PRN, Zofran PRN.  Labs reviewed: CBG 79-173, Potassium 3.2. Last HgbA1c 5.9 on 09/14/2016.  Nutrition-Focused physical exam completed. Findings are no fat depletion, no muscle depletion, and no edema.   Patient does not meet criteria for malnutrition at this time. She is at risk for acute malnutrition if inadequate intake continues.  Diet Order:  Diet heart healthy/carb modified Room service appropriate? Yes; Fluid consistency: Thin  Skin:  Reviewed, no issues  Last BM:  03/15/2017  Height:   Ht Readings from Last 1 Encounters:  03/15/17  (1.651 m)    Weight:   Wt Readings from Last 1 Encounters:  03/15/17 282 lb (127.9 kg)    Ideal Body Weight:  56.8 kg  BMI:  Body mass index is 46.93 kg/m.  Estimated Nutritional Needs:   Kcal:  2100-2300 (MSJ x 1.1-1.2)  Protein:  115-140 grams (0.9-1.1 grams/kg)  Fluid:  1.7-2 L/day (30-35 ml/kg IBW)  EDUCATION NEEDS:   Education needs addressed  Helane Rima, MS, RD, LDN Office: [phone] Pager: [phone] After Hours/Weekend Pager: [phone]

## 2017-03-16 NOTE — Care Management Note (Signed)
Case Management Note  Patient Details  Name: RAESHAWN TAFOLLA MRN: [id number] Date of Birth: 06/18/1965  Subjective/Objective:                  Admitted to [facility] under observation status with the diagnosis of gastroparesis. Her mother and son live in the home. Sister is Evette Georges [phone]).  Last seen Dr. Sherrie Mustache 01/21/17. Prescriptions are filled at [facility] in [place]. Also at [facility]. No home Health No skilled facility. No  Medical equipment in the home. Takes care of all basic and instrumental activities of daily living herself. Could drive, if she had a car. No falls Good appetite. "Will fine someone for transportation."   Action/Plan: Will continue to follow for discharge needs.   Expected Discharge Date:  03/17/17               Expected Discharge Plan:     In-House Referral:     Discharge planning Services     Post Acute Care Choice:    Choice offered to:     DME Arranged:    DME Agency:     HH Arranged:    HH Agency:     Status of Service:     If discussed at [employer], dates discussed:    Additional Comments:  Gwenette Greet, RN MSN CCM Care Management [phone] 03/16/2017, 9:43 AM

## 2017-03-17 DIAGNOSIS — K439 Ventral hernia without obstruction or gangrene: Secondary | ICD-10-CM | POA: Diagnosis not present

## 2017-03-17 DIAGNOSIS — K3184 Gastroparesis: Secondary | ICD-10-CM | POA: Diagnosis not present

## 2017-03-17 DIAGNOSIS — R112 Nausea with vomiting, unspecified: Secondary | ICD-10-CM | POA: Diagnosis not present

## 2017-03-17 DIAGNOSIS — E876 Hypokalemia: Secondary | ICD-10-CM | POA: Diagnosis not present

## 2017-03-17 LAB — GLUCOSE, CAPILLARY
GLUCOSE-CAPILLARY: 111 mg/dL — AB (ref 65–99)
GLUCOSE-CAPILLARY: 95 mg/dL (ref 65–99)
GLUCOSE-CAPILLARY: 91 mg/dL (ref 65–99)
Glucose-Capillary: 104 mg/dL — ABNORMAL HIGH (ref 65–99)

## 2017-03-17 LAB — POTASSIUM: POTASSIUM: 4.1 mmol/L (ref 3.5–5.1)

## 2017-03-17 MED ORDER — HYDROMORPHONE HCL 1 MG/ML IJ SOLN: 1.0000 mg | Freq: Three times a day (TID) | INTRAMUSCULAR | Status: DC | PRN

## 2017-03-17 MED ORDER — SODIUM CHLORIDE 0.9 % IV BOLUS (SEPSIS)
500.0000 mL | Freq: Once | INTRAVENOUS | Status: AC
  Administered 2017-03-17: 13:00:00 500 mL via INTRAVENOUS

## 2017-03-17 NOTE — Progress Notes (Signed)
[facility] - [place] at [facility]   PATIENT NAME: Daisy [name]    MR#:  [id number]  DATE OF BIRTH:  April 28, 1966  SUBJECTIVE:  Came in with nausea and vomiting. Patient having some bilious vomiting   this am. Threw up banana and OJ REVIEW OF SYSTEMS:   Review of Systems  Constitutional: Negative for chills, fever and weight loss.  HENT: Negative for ear discharge, ear pain and nosebleeds.   Eyes: Negative for blurred vision, pain and discharge.  Respiratory: Negative for sputum production, shortness of breath, wheezing and stridor.   Cardiovascular: Negative for chest pain, palpitations, orthopnea and PND.  Gastrointestinal: Positive for abdominal pain, nausea and vomiting. Negative for diarrhea.  Genitourinary: Negative for frequency and urgency.  Musculoskeletal: Negative for back pain and joint pain.  Neurological: Positive for weakness. Negative for sensory change, speech change and focal weakness.  Psychiatric/Behavioral: Negative for depression and hallucinations. The patient is not nervous/anxious.    Tolerating Diet: Clear liquid Tolerating PT: Ambulatory  DRUG ALLERGIES:   Allergies  Allergen Reactions  . Acetaminophen-Codeine Hives  . Codeine Hives  . Ibuprofen Nausea And Vomiting  . Morphine Nausea And Vomiting  . Propoxyphene Other (See Comments)    Reaction:  GI upset   . Sulfa Antibiotics Hives  . Duloxetine Rash and Other (See Comments)    Yellowing of the skin    VITALS:  Blood pressure (!) 86/44, pulse 71, temperature 98.7 F (37.1 C), temperature source Oral, resp. rate 20, height  (1.651 m), weight 127.9 kg (282 lb), SpO2 97 %.  PHYSICAL EXAMINATION:   Physical Exam  GENERAL:  51 y.o.-year-old patient lying in the bed with no acute distress. Obese EYES: Pupils equal, round, reactive to light and accommodation. No scleral icterus. Extraocular muscles intact.  HEENT: Head atraumatic, normocephalic. Oropharynx and  nasopharynx clear.  NECK:  Supple, no jugular venous distention. No thyroid enlargement, no tenderness.  LUNGS: Normal breath sounds bilaterally, no wheezing, rales, rhonchi. No use of accessory muscles of respiration.  CARDIOVASCULAR: S1, S2 normal. No murmurs, rubs, or gallops.  ABDOMEN: Soft, nontender, nondistended. Bowel sounds present. No organomegaly or mass.  EXTREMITIES: No cyanosis, clubbing or edema b/l.    NEUROLOGIC: Cranial nerves II through XII are intact. No focal Motor or sensory deficits b/l.   PSYCHIATRIC:  patient is alert and oriented x 3.  SKIN: No obvious rash, lesion, or ulcer.   LABORATORY PANEL:  CBC  Recent Labs Lab 03/16/17 0655  WBC 14.3*  HGB 14.3  HCT 43.2  PLT 363    Chemistries   Recent Labs Lab 03/15/17 2219 03/16/17 0655 03/17/17 0807  NA 140 139  --   K 3.1* 3.2* 4.1  CL 100* 101  --   CO2 26 27  --   GLUCOSE 183* 160*  --   BUN 8 8  --   CREATININE 0.76 0.75  --   CALCIUM 9.6 9.1  --   AST 22  --   --   ALT 14  --   --   ALKPHOS 56  --   --   BILITOT 0.7  --   --    Cardiac Enzymes No results for input(s): TROPONINI in the last 168 hours. RADIOLOGY:  Ct Abdomen Pelvis Wo Contrast  Result Date: 03/16/2017 CLINICAL DATA:  Nausea, vomiting and diarrhea beginning yesterday, diffuse abdominal pain. History of gastritis, diabetes and gastroparesis, abdominal wall hernia. History of cholecystectomy, appendectomy, hysterectomy, hernia repair. EXAM: CT  ABDOMEN AND PELVIS WITHOUT CONTRAST TECHNIQUE: Multidetector CT imaging of the abdomen and pelvis was performed following the standard protocol without IV contrast. COMPARISON:  CT abdomen and pelvis January 03, 2017 FINDINGS: Large body habitus results in overall noisy image quality. LOWER CHEST: Lung bases are clear. The visualized heart size is normal. No pericardial effusion. HEPATOBILIARY: Status post cholecystectomy. Normal noncontrast CT liver. PANCREAS: Normal. SPLEEN: Normal.  ADRENALS/URINARY TRACT: Kidneys are orthotopic, demonstrating normal size and morphology. No nephrolithiasis, hydronephrosis; limited assessment for renal masses on this nonenhanced examination. The unopacified ureters are normal in course and caliber. Urinary bladder is partially distended and unremarkable. Normal adrenal glands. STOMACH/BOWEL: Small to moderate hiatal hernia. The stomach, small and large bowel are normal in caliber without inflammatory changes, sensitivity decreased by lack of enteric contrast. Multiple loops of small bowel courses into ventral hernia sac without caliber change or inflammatory process. VASCULAR/LYMPHATIC: Aortoiliac vessels are normal in course and caliber. No lymphadenopathy by CT size criteria. Stable mild "misty" mesentery. REPRODUCTIVE: Status post hysterectomy. OTHER: No intraperitoneal free fluid or free air. Phleboliths and granulomas in the pelvis. 2.7 cm probable corpus luteal cyst LEFT adnexae, no routine follow-up recommendations. MUSCULOSKELETAL: Non-acute. 4.1 cm RIGHT supraumbilical ventral hernia again noted, associated anterior abdominal wall scarring. Severe lower lumbar facet arthropathy. Moderate sacroiliac osteoarthrosis. IMPRESSION: 1. No nephrolithiasis, hydronephrosis or acute intra-abdominal/pelvic process. 2. Stable wide necked RIGHT ventral hernia containing small bowel without CT findings of incarceration or strangulation. Electronically Signed   By: Awilda Metro M.D.   On: 03/16/2017 02:56   ASSESSMENT AND PLAN:  Daisy [name]  is a 51 y.o. female with a known history of Diabetes mellitus type 2, gastroparesis, abdominal hernia, hypertension presented to the emergency room with nausea and vomiting since one day. Vomitus contained food and water. Patient also has abdominal discomfort is aching in nature around the umbilicus  1. Nausea vomiting secondary to recurrent gastroparesis -CLD -IVF -prn phenergan, when necessary Zofran  2. Chronic  abdominal pain/chronic pain syndrome -Verified patient's oxycodone -She follows with Dr. crisp at the pain clinic  3. Morbid obesity  4. Type 2 diabetes -Sliding scale insulin  5. Hypotension -hold bp meds D/c IV dilaudid   Case discussed with Care Management/Social Worker. Management plans discussed with the patient, family and they are in agreement.  CODE STATUS: Full  DVT Prophylaxis: Lovenox  TOTAL TIME TAKING CARE OF THIS PATIENT: *30* minutes.  >50% time spent on counselling and coordination of care  POSSIBLE D/C IN *1-2* DAYS, DEPENDING ON CLINICAL CONDITION.  Note: This dictation was prepared with Dragon dictation along with smaller phrase technology. Any transcriptional errors that result from this process are unintentional.  Ormand Senn M.D on 03/17/2017 at 11:47 AM  Between 7am to 6pm - Pager - [phone]  After 6pm go to [email or web address] - password [email or web address]  [facility]  Office  [phone]  CC: Primary care physician; Malva Limes, MDPatient ID: Daisy [name], female   DOB: Feb 13, 1966, 51 y.o.   MRN: [id number]

## 2017-03-17 NOTE — Progress Notes (Signed)
RN notified by student of SBP in the 70's. VS checked, BP manually taken 86/44, HR 71, respirations 20, O2 sats 84% on RA. 3L O2 per [place] applied, O2 sats up to 97%.  Pt sleepy, but easily aroused and answers appropriately. Dr Allena Katz notified and present in pt's room. Orders received. Pt has no IV access and is a hard stick. STAT order for IV team. Bolus pending.

## 2017-03-17 NOTE — Plan of Care (Signed)
Problem: Pain Managment: Goal: General experience of comfort will improve Outcome: Progressing Oxycodone given for acute on chronic abdominal pain x2 during the shift with improvement.  Problem: Physical Regulation: Goal: Ability to maintain clinical measurements within normal limits will improve Outcome: Progressing BP post bolus 105/54. Rest of VS. O2 sats in the high 90's on RA.   Problem: Fluid Volume: Goal: Ability to maintain a balanced intake and output will improve Outcome: Progressing Pt had n/v in am. Phenergan and zofran given with improvement. Pt had 100% of dinner.   Problem: Nutrition: Goal: Adequate nutrition will be maintained Outcome: Progressing As above

## 2017-03-17 NOTE — Progress Notes (Signed)
Medications administered by student RN [id number] with supervision of Clinical Instructor Aahna Rossa MSN, RN-BC or patient's assigned RN.   

## 2017-03-18 DIAGNOSIS — K3184 Gastroparesis: Secondary | ICD-10-CM | POA: Diagnosis not present

## 2017-03-18 DIAGNOSIS — R112 Nausea with vomiting, unspecified: Secondary | ICD-10-CM | POA: Diagnosis not present

## 2017-03-18 DIAGNOSIS — K439 Ventral hernia without obstruction or gangrene: Secondary | ICD-10-CM | POA: Diagnosis not present

## 2017-03-18 DIAGNOSIS — E876 Hypokalemia: Secondary | ICD-10-CM | POA: Diagnosis not present

## 2017-03-18 LAB — GLUCOSE, CAPILLARY
GLUCOSE-CAPILLARY: 98 mg/dL (ref 65–99)
GLUCOSE-CAPILLARY: 140 mg/dL — AB (ref 65–99)

## 2017-03-18 MED ORDER — LORATADINE 10 MG PO TABS
10.0000 mg | ORAL_TABLET | Freq: Every day | ORAL | Status: DC
  Administered 2017-03-18: 10 mg via ORAL
  Filled 2017-03-18: qty 1

## 2017-03-18 MED ORDER — PREMIER PROTEIN SHAKE: 11.0000 [oz_av] | Freq: Three times a day (TID) | ORAL | 0 refills | Status: AC

## 2017-03-18 MED ORDER — LORATADINE 10 MG PO TABS: 10.0000 mg | ORAL_TABLET | Freq: Every day | ORAL | 0 refills | Status: DC

## 2017-03-18 NOTE — Discharge Summary (Signed)
[facility] - [place] at [facility]   PATIENT NAME: Daisy [name]    MR#:  [id number]  DATE OF BIRTH:  1965-10-29  DATE OF ADMISSION:  03/15/2017 ADMITTING PHYSICIAN: Ihor Austin, MD  DATE OF DISCHARGE: 03/18/17  PRIMARY CARE PHYSICIAN: Malva Limes, MD    ADMISSION DIAGNOSIS:  Gastroparesis [K31.84] Generalized abdominal pain [R10.84] Hypertensive urgency [I16.0] Nausea vomiting and diarrhea [R11.2, R19.7] Intractable vomiting with nausea, unspecified vomiting type [R11.2]  DISCHARGE DIAGNOSIS:  Nausea/vomiting due to Gastroparesis   SECONDARY DIAGNOSIS:   Past Medical History:  Diagnosis Date  . Diabetes mellitus without complication (HCC)   . Gastritis   . Gastroparesis   . Hernia of abdominal wall   . Hypertension   . Migraine     HOSPITAL COURSE:  DanitaOliveris a 51 y.o.femalewith a known history of Diabetes mellitus type 2, gastroparesis, abdominal hernia, hypertension presented to the emergency room with nausea and vomiting since one day.Vomitus contained food and water.Patient also has abdominal discomfort is aching in nature around the umbilicus  1. Nausea vomiting secondary to recurrent gastroparesis -CLD--now on FLD. Recommended pt to eat small frequent meals -recieved IVF -prn phenergan, when necessary Zofran  2. Chronic abdominal pain/chronic pain syndrome -Verified patient's oxycodone -She follows with Dr. crisp at the pain clinic  3. Morbid obesity  4. Type 2 diabetes -Sliding scale insulin  5. Hypotension -hold bp meds--now bp much improved D/c IV dilaudid  6.sinus congestion Allegra as out pt  D/c home later today  CONSULTS OBTAINED:    DRUG ALLERGIES:   Allergies  Allergen Reactions  . Acetaminophen-Codeine Hives  . Codeine Hives  . Ibuprofen Nausea And Vomiting  . Morphine Nausea And Vomiting  . Propoxyphene Other (See Comments)    Reaction:  GI upset   . Sulfa Antibiotics Hives   . Duloxetine Rash and Other (See Comments)    Yellowing of the skin    DISCHARGE MEDICATIONS:   Current Discharge Medication List    START taking these medications   Details  loratadine (CLARITIN) 10 MG tablet Take 1 tablet (10 mg total) by mouth daily. Qty: 30 tablet, Refills: 0    protein supplement shake (PREMIER PROTEIN) LIQD Take 325 mLs (11 oz total) by mouth 3 (three) times daily between meals. Qty: 30 Can, Refills: 0      CONTINUE these medications which have NOT CHANGED   Details  albuterol (PROVENTIL HFA;VENTOLIN HFA) 108 (90 Base) MCG/ACT inhaler Inhale 2 puffs into the lungs every 6 (six) hours as needed for wheezing or shortness of breath.    alprazolam (XANAX) 2 MG tablet Take 2 mg by mouth 3 (three) times daily as needed for anxiety.     amLODipine (NORVASC) 5 MG tablet Take 1 tablet (5 mg total) by mouth daily. Qty: 90 tablet, Refills: 3   Associated Diagnoses: Essential hypertension    cholecalciferol (VITAMIN D) 1000 units tablet Take 1,000 Units by mouth daily.    cloNIDine (CATAPRES - DOSED IN MG/24 HR) 0.1 mg/24hr patch PLACE 1 PATCH (0.1 MG TOTAL) ONTO THE SKIN ONCE A WEEK. Qty: 4 patch, Refills: 6    furosemide (LASIX) 20 MG tablet Take 20 mg by mouth daily as needed for edema.    lisinopril (PRINIVIL,ZESTRIL) 20 MG tablet Take 1.5 tablets (30 mg total) by mouth daily. Qty: 90 tablet, Refills: 2   Associated Diagnoses: Essential hypertension    metFORMIN (GLUCOPHAGE) 500 MG tablet Take 1 tablet (500 mg total) by mouth daily. Qty:  90 tablet, Refills: 4   Associated Diagnoses: Controlled type 2 diabetes mellitus without complication, without long-term current use of insulin (HCC)    metoprolol succinate (TOPROL-XL) 25 MG 24 hr tablet Take 2 tablets (50 mg total) by mouth daily. Qty: 180 tablet, Refills: 3   Associated Diagnoses: Essential hypertension    Oxycodone HCl 10 MG TABS Take 10 mg by mouth as directed. 7 tablets daily if tolerated per Dr.  Metta Clines    pantoprazole (PROTONIX) 40 MG tablet Take 1 tablet (40 mg total) by mouth 2 (two) times daily. Qty: 180 tablet, Refills: 4    promethazine (PHENERGAN) 12.5 MG tablet Take 1 tablet (12.5 mg total) by mouth every 6 (six) hours as needed for refractory nausea / vomiting. Qty: 30 tablet, Refills: 3    ranitidine (ZANTAC) 150 MG tablet Take 150 mg by mouth at bedtime.    tiZANidine (ZANAFLEX) 4 MG tablet Take 4 mg by mouth 3 (three) times daily. 1-3 tablets per day as tolerated per Dr. Metta Clines    vitamin B-12 (CYANOCOBALAMIN) 1000 MCG tablet Take 1,000 mcg by mouth daily.      STOP taking these medications     FLUoxetine (PROZAC) 20 MG capsule      lamoTRIgine (LAMICTAL) 100 MG tablet      traZODone (DESYREL) 100 MG tablet         If you experience worsening of your admission symptoms, develop shortness of breath, life threatening emergency, suicidal or homicidal thoughts you must seek medical attention immediately by calling 911 or calling your MD immediately  if symptoms less severe.  You Must read complete instructions/literature along with all the possible adverse reactions/side effects for all the Medicines you take and that have been prescribed to you. Take any new Medicines after you have completely understood and accept all the possible adverse reactions/side effects.   Please note  You were cared for by a hospitalist during your hospital stay. If you have any questions about your discharge medications or the care you received while you were in the hospital after you are discharged, you can call the unit and asked to speak with the hospitalist on call if the hospitalist that took care of you is not available. Once you are discharged, your primary care physician will handle any further medical issues. Please note that NO REFILLS for any discharge medications will be authorized once you are discharged, as it is imperative that you return to your primary care physician (or  establish a relationship with a primary care physician if you do not have one) for your aftercare needs so that they can reassess your need for medications and monitor your lab values. Today   SUBJECTIVE   Headache and sinus congestion No vomiting  VITAL SIGNS:  Blood pressure (!) 113/55, pulse 89, temperature 98.6 F (37 C), temperature source Oral, resp. rate 16, height  (1.651 m), weight 127.9 kg (282 lb), SpO2 93 %.  I/O:   Intake/Output Summary (Last 24 hours) at 03/18/17 1132 Last data filed at 03/18/17 0300  Gross per 24 hour  Intake              975 ml  Output                0 ml  Net              975 ml    PHYSICAL EXAMINATION:  GENERAL:  51 y.o.-year-old patient lying in the bed with no acute distress.  obese EYES: Pupils equal, round, reactive to light and accommodation. No scleral icterus. Extraocular muscles intact.  HEENT: Head atraumatic, normocephalic. Oropharynx and nasopharynx clear.  NECK:  Supple, no jugular venous distention. No thyroid enlargement, no tenderness.  LUNGS: Normal breath sounds bilaterally, no wheezing, rales,rhonchi or crepitation. No use of accessory muscles of respiration.  CARDIOVASCULAR: S1, S2 normal. No murmurs, rubs, or gallops.  ABDOMEN: Soft, non-tender, non-distended. Bowel sounds present. No organomegaly or mass.  EXTREMITIES: No pedal edema, cyanosis, or clubbing.  NEUROLOGIC: Cranial nerves II through XII are intact. Muscle strength 5/5 in all extremities. Sensation intact. Gait not checked.  PSYCHIATRIC: The patient is alert and oriented x 3.  SKIN: No obvious rash, lesion, or ulcer.   DATA REVIEW:   CBC   Recent Labs Lab 03/16/17 0655  WBC 14.3*  HGB 14.3  HCT 43.2  PLT 363    Chemistries   Recent Labs Lab 03/15/17 2219 03/16/17 0655 03/17/17 0807  NA 140 139  --   K 3.1* 3.2* 4.1  CL 100* 101  --   CO2 26 27  --   GLUCOSE 183* 160*  --   BUN 8 8  --   CREATININE 0.76 0.75  --   CALCIUM 9.6 9.1  --    AST 22  --   --   ALT 14  --   --   ALKPHOS 56  --   --   BILITOT 0.7  --   --     Microbiology Results   No results found for this or any previous visit (from the past 240 hour(s)).  RADIOLOGY:  No results found.   Management plans discussed with the patient, family and they are in agreement.  CODE STATUS:     Code Status Orders        Start     Ordered   03/16/17 0455  Full code  Continuous     03/16/17 0454    Code Status History    Date Active Date Inactive Code Status Order ID Comments User Context   01/03/2017  8:24 AM 01/05/2017  3:57 PM Full Code [id number]  Ihor Austin, MD Inpatient   03/23/2016  6:29 AM 03/26/2016  4:51 PM Full Code [id number]  Arnaldo Natal, MD Inpatient   09/28/2015  4:54 AM 09/29/2015  5:20 PM Full Code [id number]  Ihor Austin, MD ED   05/05/2015  8:36 PM 05/09/2015  8:06 PM Full Code [id number]  Hower, Cletis Athens, MD ED   01/07/2015  7:11 PM 01/09/2015  6:55 PM Full Code [id number]  Milagros Loll, MD ED      TOTAL TIME TAKING CARE OF THIS PATIENT: *40* minutes.    Aashka Salomone M.D on 03/18/2017 at 11:32 AM  Between 7am to 6pm - Pager - [phone] After 6pm go to [email or web address] - Social research officer, government  [facility]  Office  [phone]  CC: Primary care physician; Malva Limes, MD

## 2017-03-18 NOTE — Progress Notes (Signed)
Received MD order to discharge patient to home reviewed home meds, prescriptions and follow up appointments with patient and she verbalized understanding

## 2017-04-05 DIAGNOSIS — M5136 Other intervertebral disc degeneration, lumbar region: Secondary | ICD-10-CM | POA: Diagnosis not present

## 2017-04-05 DIAGNOSIS — M5030 Other cervical disc degeneration, unspecified cervical region: Secondary | ICD-10-CM | POA: Diagnosis not present

## 2017-04-05 DIAGNOSIS — M5481 Occipital neuralgia: Secondary | ICD-10-CM | POA: Diagnosis not present

## 2017-04-05 DIAGNOSIS — Z5181 Encounter for therapeutic drug level monitoring: Secondary | ICD-10-CM | POA: Diagnosis not present

## 2017-04-11 NOTE — Telephone Encounter (Signed)
error 

## 2017-04-15 ENCOUNTER — Ambulatory Visit (INDEPENDENT_AMBULATORY_CARE_PROVIDER_SITE_OTHER): Payer: Medicare Other | Admitting: Family Medicine

## 2017-04-15 VITALS — BP 144/98 | HR 68 | Temp 98.7°F | Resp 16 | Wt 283.0 lb

## 2017-04-15 DIAGNOSIS — Z23 Encounter for immunization: Secondary | ICD-10-CM

## 2017-04-15 DIAGNOSIS — E119 Type 2 diabetes mellitus without complications: Secondary | ICD-10-CM

## 2017-04-15 DIAGNOSIS — I10 Essential (primary) hypertension: Secondary | ICD-10-CM

## 2017-04-15 DIAGNOSIS — E785 Hyperlipidemia, unspecified: Secondary | ICD-10-CM

## 2017-04-15 DIAGNOSIS — M170 Bilateral primary osteoarthritis of knee: Secondary | ICD-10-CM | POA: Diagnosis not present

## 2017-04-15 DIAGNOSIS — J301 Allergic rhinitis due to pollen: Secondary | ICD-10-CM

## 2017-04-15 LAB — POCT UA - MICROALBUMIN: MICROALBUMIN (UR) POC: 20 mg/L

## 2017-04-15 LAB — POCT GLYCOSYLATED HEMOGLOBIN (HGB A1C)
ESTIMATED AVERAGE GLUCOSE: 128
HEMOGLOBIN A1C: 6.1

## 2017-04-15 MED ORDER — MONTELUKAST SODIUM 10 MG PO TABS: 10.0000 mg | ORAL_TABLET | Freq: Every day | ORAL | 1 refills | Status: DC

## 2017-04-15 MED ORDER — METFORMIN HCL 500 MG PO TABS: 500.0000 mg | ORAL_TABLET | Freq: Two times a day (BID) | ORAL | 4 refills | Status: DC

## 2017-04-15 NOTE — Patient Instructions (Signed)
Patient Instructions - Teryl LucyRyan, Jennifer C, RD/LDN - 03/09/2017 10:15 AM EDT 1. Meal schedule: remember to eat protein every 4-5 hours - include protein at every meal and every snack 9:00am: breakfast (protein shake)  12:30pm: lunch 4:00pm: protein based snack 6:00pm: dinner   PROTEIN OPTIONS: chicken/beef/pork, fish/tuna, eggs, cheese, cottage cheese, [place] yogurt, deli meat, protein shakes, beans, nuts/seeds, peanut butter * Nuts and peanut butter are restricted to one serving once per day. 1/4 nuts OR 2 Tbsp peanut butter  2. Practice not drinking at meals and waiting for 30 minutes after meals to start drinking again.  3. Eat protein foods first. Vegetables second. Carbohydrates/fruits last.   4. Practice mindful eating. Eat until you are satisfied. Do not go to the point of being full.  5. Eliminate carbonated/sugar sweetened beverages! - NO juice, soda, diet soda, sweet tea, lemonade, Kool-Aid

## 2017-04-15 NOTE — Progress Notes (Signed)
Patient: Daisy [name] Female    DOB: [birth year]   51 y.o.   MRN: [id number] Visit Date: 04/15/2017  Today's Provider: Mila Merry, MD   Chief Complaint  Patient presents with  . Hypertension  . Diabetes  . Hyperlipidemia   Subjective:    HPI   Diabetes Mellitus Type II, Follow-up:   Lab Results  Component Value Date   HGBA1C 5.9 (H) 09/14/2016   HGBA1C 6.2 (H) 03/22/2016   HGBA1C 6.2 09/29/2011   Last seen for diabetes 7 months ago.  Management since then includes; no changes. She reports good compliance with treatment. She is not having side effects.  Current symptoms include polydipsia, polyuria and visual disturbances and have been stable. Home blood sugar records: fasting range: 130-140  Episodes of hypoglycemia? no   Current Insulin Regimen: none Most Recent Eye Exam: > 1 year ago Weight trend: stable Prior visit with dietician: no Current diet: in general, an "unhealthy" diet Current exercise: none  ------------------------------------------------------------------------   Hypertension, follow-up:  BP Readings from Last 3 Encounters:  03/18/17 (!) 113/55  01/21/17 100/60  01/05/17 101/84    She was last seen for hypertension 2 months ago.  BP at that visit was 100/60. Management since that visit includes; no changes.She reports good compliance with treatment. She is not having side effects.  She is not exercising. She is adherent to low salt diet.   Outside blood pressures are checked at home and vary per patient report . She is experiencing none.  Patient denies chest pain, chest pressure/discomfort, claudication, dyspnea, exertional chest pressure/discomfort, fatigue, irregular heart beat, lower extremity edema, near-syncope, orthopnea, palpitations, paroxysmal nocturnal dyspnea, syncope and tachypnea.   Cardiovascular risk factors include diabetes mellitus, dyslipidemia, hypertension and obesity (BMI >= 30 kg/m2).  Use of agents  associated with hypertension: none.   ------------------------------------------------------------------------    Lipid/Cholesterol, Follow-up:   Last seen for this 7 months ago.  Management since that visit includes; labs checked, no changes.  Last Lipid Panel:    Component Value Date/Time   CHOL 188 09/14/2016 1227   CHOL 236 (H) 09/29/2011 0404   TRIG 106 09/14/2016 1227   TRIG 107 09/29/2011 0404   HDL 59 09/14/2016 1227   HDL 53 09/29/2011 0404   CHOLHDL 3.2 09/14/2016 1227   VLDL 21 09/29/2011 0404   LDLCALC 108 (H) 09/14/2016 1227   LDLCALC 162 (H) 09/29/2011 0404    She reports good compliance with treatment. She is not having side effects.   Wt Readings from Last 3 Encounters:  03/15/17 282 lb (127.9 kg)  01/21/17 300 lb (136.1 kg)  01/03/17 276 lb (125.2 kg)    ------------------------------------------------------------------------ Morbid obesity  She is anticipating gastric sleeve surgery next year. She has been seeing nutritionist and working on low glycemic index diet and trying to exercise 150 minutes per week. She Is struggling to find time for recommended amount of exercise, but is following diet. She is very limited in exercise due to arthritis in her knees.    Allergies  Allergen Reactions  . Acetaminophen-Codeine Hives  . Codeine Hives  . Ibuprofen Nausea And Vomiting  . Morphine Nausea And Vomiting  . Propoxyphene Other (See Comments)    Reaction:  GI upset   . Sulfa Antibiotics Hives  . Duloxetine Rash and Other (See Comments)    Yellowing of the skin     Current Outpatient Prescriptions:  .  traZODone (DESYREL) 100 MG tablet, Take 100 mg by  mouth at bedtime., Disp: , Rfl:  .  albuterol (PROVENTIL HFA;VENTOLIN HFA) 108 (90 Base) MCG/ACT inhaler, Inhale 2 puffs into the lungs every 6 (six) hours as needed for wheezing or shortness of breath., Disp: , Rfl:  .  alprazolam (XANAX) 2 MG tablet, Take 2 mg by mouth 3 (three) times daily as  needed for anxiety. , Disp: , Rfl:  .  amLODipine (NORVASC) 5 MG tablet, Take 1 tablet (5 mg total) by mouth daily., Disp: 90 tablet, Rfl: 3 .  cholecalciferol (VITAMIN D) 1000 units tablet, Take 1,000 Units by mouth daily., Disp: , Rfl:  .  cloNIDine (CATAPRES - DOSED IN MG/24 HR) 0.1 mg/24hr patch, PLACE 1 PATCH (0.1 MG TOTAL) ONTO THE SKIN ONCE A WEEK., Disp: 4 patch, Rfl: 6 .  FLUoxetine (PROZAC) 20 MG capsule, Take 1 capsule by mouth daily., Disp: , Rfl:  .  furosemide (LASIX) 20 MG tablet, Take 20 mg by mouth daily as needed for edema., Disp: , Rfl:  .  lamoTRIgine (LAMICTAL) 100 MG tablet, Take 1 tablet by mouth daily., Disp: , Rfl:  .  lisinopril (PRINIVIL,ZESTRIL) 20 MG tablet, Take 1.5 tablets (30 mg total) by mouth daily., Disp: 90 tablet, Rfl: 2 .  loratadine (CLARITIN) 10 MG tablet, Take 1 tablet (10 mg total) by mouth daily., Disp: 30 tablet, Rfl: 0 .  metFORMIN (GLUCOPHAGE) 500 MG tablet, Take 1 tablet (500 mg total) by mouth daily., Disp: 90 tablet, Rfl: 4 .  metoprolol succinate (TOPROL-XL) 25 MG 24 hr tablet, Take 2 tablets (50 mg total) by mouth daily., Disp: 180 tablet, Rfl: 3 .  Oxycodone HCl 10 MG TABS, Take 10 mg by mouth as directed. 7 tablets daily if tolerated per Dr. Metta Clines, Disp: , Rfl:  .  pantoprazole (PROTONIX) 40 MG tablet, Take 1 tablet (40 mg total) by mouth 2 (two) times daily., Disp: 180 tablet, Rfl: 4 .  promethazine (PHENERGAN) 12.5 MG tablet, Take 1 tablet (12.5 mg total) by mouth every 6 (six) hours as needed for refractory nausea / vomiting., Disp: 30 tablet, Rfl: 3 .  protein supplement shake (PREMIER PROTEIN) LIQD, Take 325 mLs (11 oz total) by mouth 3 (three) times daily between meals., Disp: 30 Can, Rfl: 0 .  ranitidine (ZANTAC) 150 MG tablet, Take 150 mg by mouth at bedtime., Disp: , Rfl:  .  tiZANidine (ZANAFLEX) 4 MG tablet, Take 4 mg by mouth 3 (three) times daily. 1-3 tablets per day as tolerated per Dr. Metta Clines, Disp: , Rfl:  .  vitamin B-12  (CYANOCOBALAMIN) 1000 MCG tablet, Take 1,000 mcg by mouth daily., Disp: , Rfl:  .  zolpidem (AMBIEN) 10 MG tablet, Take 1 tablet by mouth at bedtime as needed., Disp: , Rfl:   Review of Systems  Constitutional: Negative for appetite change, chills, fatigue and fever.  Respiratory: Negative for chest tightness and shortness of breath.   Cardiovascular: Negative for chest pain and palpitations.  Gastrointestinal: Negative for abdominal pain, nausea and vomiting.  Neurological: Negative for dizziness and weakness.    Social History  Substance Use Topics  . Smoking status: Never Smoker  . Smokeless tobacco: Former Neurosurgeon    Types: Snuff  . Alcohol use 0.0 oz/week     Comment: only on special occasions- wine   Objective:   BP (!) 144/98 (BP Location: Right Wrist, Cuff Size: Large)   Pulse 68   Temp 98.7 F (37.1 C) (Oral)   Resp 16   Wt 283 lb (128.4 kg)  SpO2 97% Comment: room air  BMI 47.09 kg/m  Vitals:   04/15/17 1101 04/15/17 1106  BP: (!) 148/100 (!) 144/98  Pulse: 68   Resp: 16   Temp: 98.7 F (37.1 C)   TempSrc: Oral   SpO2: 97%   Weight: 283 lb (128.4 kg)      Physical Exam   General Appearance:    Alert, cooperative, no distress, morbidly obese  Eyes:    PERRL, conjunctiva/corneas clear, EOM's intact       Lungs:     Clear to auscultation bilaterally, respirations unlabored  Heart:    Regular rate and rhythm  Neurologic:   Awake, alert, oriented x 3. No apparent focal neurological           defect.         Results for orders placed or performed in visit on 04/15/17  POCT HgB A1C  Result Value Ref Range   Hemoglobin A1C 6.1    Est. average glucose Bld gHb Est-mCnc 128   POCT UA - Microalbumin  Result Value Ref Range   Microalbumin Ur, POC 20 mg/L   Creatinine, POC n/a mg/dL   Albumin/Creatinine Ratio, Urine, POC n/a        Assessment & Plan:     1. Controlled type 2 diabetes mellitus without complication, without long-term current use of insulin  (HCC) Continue current medications.   - POCT HgB A1C - POCT UA - Microalbumin - Flu Vaccine QUAD 6+ mos PF IM (Fluarix Quad PF) - metFORMIN (GLUCOPHAGE) 500 MG tablet; Take 1 tablet (500 mg total) by mouth 2 (two) times daily with a meal.  Dispense: 180 tablet; Refill: 4  2. Essential hypertension BP up today, she is working on losing weight and maybe candidate for bariatric surgery.   3. Hyperlipidemia, unspecified hyperlipidemia type Discussed prudent diet.   4. Primary osteoarthritis of both knees Encourage straight leg raise and walk for exercise as tolerating.   5. Allergic rhinitis due to pollen, unspecified seasonality try- montelukast (SINGULAIR) 10 MG tablet; Take 1 tablet (10 mg total) by mouth at bedtime.  Dispense: 90 tablet; Refill: 1  6. Morbid obesity (HCC) Discussed strategies to increase exercise and continue nutritional counseling. Anticipating bariatric surgery .  Return in about 4 months (around 08/13/2017) for diabetes, one month for weight check.        Mila Merryonald Fisher, MD  [facility]

## 2017-04-18 DIAGNOSIS — J309 Allergic rhinitis, unspecified: Secondary | ICD-10-CM | POA: Insufficient documentation

## 2017-05-03 DIAGNOSIS — Z5181 Encounter for therapeutic drug level monitoring: Secondary | ICD-10-CM | POA: Diagnosis not present

## 2017-05-03 DIAGNOSIS — M5136 Other intervertebral disc degeneration, lumbar region: Secondary | ICD-10-CM | POA: Diagnosis not present

## 2017-05-03 DIAGNOSIS — M5481 Occipital neuralgia: Secondary | ICD-10-CM | POA: Diagnosis not present

## 2017-05-03 DIAGNOSIS — M5030 Other cervical disc degeneration, unspecified cervical region: Secondary | ICD-10-CM | POA: Diagnosis not present

## 2017-05-16 ENCOUNTER — Encounter: Payer: Self-pay | Admitting: Family Medicine

## 2017-05-16 ENCOUNTER — Ambulatory Visit (INDEPENDENT_AMBULATORY_CARE_PROVIDER_SITE_OTHER): Payer: Medicare Other | Admitting: Family Medicine

## 2017-05-16 DIAGNOSIS — E119 Type 2 diabetes mellitus without complications: Secondary | ICD-10-CM | POA: Diagnosis not present

## 2017-05-16 MED ORDER — ATORVASTATIN CALCIUM 20 MG PO TABS: 20.0000 mg | ORAL_TABLET | Freq: Every day | ORAL | 2 refills | Status: DC

## 2017-05-16 NOTE — Progress Notes (Signed)
Patient: Daisy GoslingDanita A Brouillard Female    DOB: [birth year]   51 y.o.   MRN: [id number] Visit Date: 05/16/2017  Today's Provider: Mila Merryonald Leonor Darnell, MD   Chief Complaint  Patient presents with  . Follow-up   Subjective:    HPI  Morbid obesity (HCC) Today visit # 2 of 6. From 04/15/2017-Discussed strategies to increase exercise and continue nutritional counseling. Anticipating bariatric surgery.Has not been able to exercise due to daily knee pain. Has been eating more high protein foods, has been avoiding rice and starchy foods. Has been using portion divider. Eats breakfast and supper, and snacks during the day.       Wt Readings from Last 5 Encounters:  05/16/17 293 lb 3.2 oz (133 kg)  04/15/17 283 lb (128.4 kg)  03/15/17 282 lb (127.9 kg)  01/21/17 300 lb (136.1 kg)  01/03/17 276 lb (125.2 kg)      Allergies  Allergen Reactions  . Acetaminophen-Codeine Hives  . Codeine Hives  . Ibuprofen Nausea And Vomiting  . Morphine Nausea And Vomiting  . Propoxyphene Other (See Comments)    Reaction:  GI upset   . Sulfa Antibiotics Hives  . Duloxetine Rash and Other (See Comments)    Yellowing of the skin     Current Outpatient Medications:  .  albuterol (PROVENTIL HFA;VENTOLIN HFA) 108 (90 Base) MCG/ACT inhaler, Inhale 2 puffs into the lungs every 6 (six) hours as needed for wheezing or shortness of breath., Disp: , Rfl:  .  alprazolam (XANAX) 2 MG tablet, Take 2 mg by mouth 3 (three) times daily as needed for anxiety. , Disp: , Rfl:  .  amLODipine (NORVASC) 5 MG tablet, Take 1 tablet (5 mg total) by mouth daily., Disp: 90 tablet, Rfl: 3 .  cholecalciferol (VITAMIN D) 1000 units tablet, Take 1,000 Units by mouth daily., Disp: , Rfl:  .  cloNIDine (CATAPRES - DOSED IN MG/24 HR) 0.1 mg/24hr patch, PLACE 1 PATCH (0.1 MG TOTAL) ONTO THE SKIN ONCE A WEEK., Disp: 4 patch, Rfl: 6 .  FLUoxetine (PROZAC) 20 MG capsule, Take 1 capsule by mouth daily., Disp: , Rfl:  .  furosemide (LASIX) 20 MG  tablet, Take 20 mg by mouth daily as needed for edema., Disp: , Rfl:  .  lamoTRIgine (LAMICTAL) 100 MG tablet, Take 1 tablet by mouth daily., Disp: , Rfl:  .  lisinopril (PRINIVIL,ZESTRIL) 20 MG tablet, Take 1.5 tablets (30 mg total) by mouth daily., Disp: 90 tablet, Rfl: 2 .  loratadine (CLARITIN) 10 MG tablet, Take 1 tablet (10 mg total) by mouth daily., Disp: 30 tablet, Rfl: 0 .  metFORMIN (GLUCOPHAGE) 500 MG tablet, Take 1 tablet (500 mg total) by mouth 2 (two) times daily with a meal., Disp: 180 tablet, Rfl: 4 .  metoprolol succinate (TOPROL-XL) 25 MG 24 hr tablet, Take 2 tablets (50 mg total) by mouth daily., Disp: 180 tablet, Rfl: 3 .  montelukast (SINGULAIR) 10 MG tablet, Take 1 tablet (10 mg total) by mouth at bedtime., Disp: 90 tablet, Rfl: 1 .  Oxycodone HCl 10 MG TABS, Take 10 mg by mouth as directed. 7 tablets daily if tolerated per Dr. Metta Clinesrisp, Disp: , Rfl:  .  pantoprazole (PROTONIX) 40 MG tablet, Take 1 tablet (40 mg total) by mouth 2 (two) times daily., Disp: 180 tablet, Rfl: 4 .  promethazine (PHENERGAN) 12.5 MG tablet, Take 1 tablet (12.5 mg total) by mouth every 6 (six) hours as needed for refractory nausea / vomiting., Disp:  30 tablet, Rfl: 3 .  protein supplement shake (PREMIER PROTEIN) LIQD, Take 325 mLs (11 oz total) by mouth 3 (three) times daily between meals., Disp: 30 Can, Rfl: 0 .  ranitidine (ZANTAC) 150 MG tablet, Take 150 mg by mouth at bedtime., Disp: , Rfl:  .  tiZANidine (ZANAFLEX) 4 MG tablet, Take 4 mg by mouth 3 (three) times daily. 1-3 tablets per day as tolerated per Dr. Metta Clinesrisp, Disp: , Rfl:  .  traZODone (DESYREL) 100 MG tablet, Take 100 mg by mouth at bedtime., Disp: , Rfl:  .  vitamin B-12 (CYANOCOBALAMIN) 1000 MCG tablet, Take 1,000 mcg by mouth daily., Disp: , Rfl:  .  zolpidem (AMBIEN) 10 MG tablet, Take 1 tablet by mouth at bedtime as needed., Disp: , Rfl:   Review of Systems  Constitutional: Negative for appetite change, chills, fatigue and fever.    Respiratory: Negative for chest tightness and shortness of breath.   Cardiovascular: Negative for chest pain and palpitations.  Gastrointestinal: Negative for abdominal pain, nausea and vomiting.  Neurological: Negative for dizziness and weakness.    Social History   Tobacco Use  . Smoking status: Never Smoker  . Smokeless tobacco: Former NeurosurgeonUser    Types: Snuff  Substance Use Topics  . Alcohol use: Yes    Alcohol/week: 0.0 oz    Comment: only on special occasions- wine   Objective:   BP 124/78 (BP Location: Left Arm, Patient Position: Sitting, Cuff Size: Large)   Pulse 78   Temp 98.5 F (36.9 C) (Oral)   Resp 16   Ht 5\' 4"  (1.626 m)   Wt 293 lb 3.2 oz (133 kg)   SpO2 99%   BMI 50.33 kg/m  Vitals:   05/16/17 1354  BP: 124/78  Pulse: 78  Resp: 16  Temp: 98.5 F (36.9 C)  TempSrc: Oral  SpO2: 99%  Weight: 293 lb 3.2 oz (133 kg)  Height: 5\' 4"  (1.626 m)     Physical Exam  General Appearance:    Alert, cooperative, no distress, morbidly obese  Eyes:    PERRL, conjunctiva/corneas clear, EOM's intact       Lungs:     Clear to auscultation bilaterally, respirations unlabored  Heart:    Regular rate and rhythm  Neurologic:   Awake, alert, oriented x 3. No apparent focal neurological           defect.       Wt Readings from Last 3 Encounters:  05/16/17 293 lb 3.2 oz (133 kg)  04/15/17 283 lb (128.4 kg)  03/15/17 282 lb (127.9 kg)        Assessment & Plan:     1. Morbid obesity (HCC) Continues to gain weight despite attempts to reduce caloric intake. She reports eating healthy food, but needs to work on portion size. Encourage frequent brief exercise as tolerate.   2. Controlled type 2 diabetes mellitus without complication, without long-term current use of insulin (HCC) Counseled regarding benefits of statins for diabetic and she agrees to try  atorvastatin (LIPITOR) 20 MG tablet; Take 1 tablet (20 mg total) by mouth daily.  Dispense: 30 tablet; Refill:  2   Over half of this 25 minute visit were spent in counseling and coordinating care of multiple medical problems.  Return in about 1 month (around 06/16/2017).       Mila Merryonald Rylyn Zawistowski, MD  [facility]

## 2017-05-19 DIAGNOSIS — M5136 Other intervertebral disc degeneration, lumbar region: Secondary | ICD-10-CM | POA: Diagnosis not present

## 2017-05-19 DIAGNOSIS — Z5181 Encounter for therapeutic drug level monitoring: Secondary | ICD-10-CM | POA: Diagnosis not present

## 2017-05-19 DIAGNOSIS — M5481 Occipital neuralgia: Secondary | ICD-10-CM | POA: Diagnosis not present

## 2017-05-19 DIAGNOSIS — M5030 Other cervical disc degeneration, unspecified cervical region: Secondary | ICD-10-CM | POA: Diagnosis not present

## 2017-06-01 ENCOUNTER — Other Ambulatory Visit: Payer: Self-pay | Admitting: Family Medicine

## 2017-06-01 DIAGNOSIS — I10 Essential (primary) hypertension: Secondary | ICD-10-CM

## 2017-06-02 ENCOUNTER — Telehealth: Payer: Self-pay | Admitting: Family Medicine

## 2017-06-02 ENCOUNTER — Encounter: Payer: Self-pay | Admitting: Family Medicine

## 2017-06-02 ENCOUNTER — Ambulatory Visit (INDEPENDENT_AMBULATORY_CARE_PROVIDER_SITE_OTHER): Payer: Medicare Other | Admitting: Family Medicine

## 2017-06-02 VITALS — BP 116/82 | HR 83 | Temp 98.1°F | Resp 18 | Wt 288.0 lb

## 2017-06-02 DIAGNOSIS — B373 Candidiasis of vulva and vagina: Secondary | ICD-10-CM | POA: Diagnosis not present

## 2017-06-02 DIAGNOSIS — I10 Essential (primary) hypertension: Secondary | ICD-10-CM

## 2017-06-02 DIAGNOSIS — B3731 Acute candidiasis of vulva and vagina: Secondary | ICD-10-CM

## 2017-06-02 DIAGNOSIS — R059 Cough, unspecified: Secondary | ICD-10-CM

## 2017-06-02 DIAGNOSIS — H6691 Otitis media, unspecified, right ear: Secondary | ICD-10-CM

## 2017-06-02 DIAGNOSIS — J329 Chronic sinusitis, unspecified: Secondary | ICD-10-CM | POA: Diagnosis not present

## 2017-06-02 DIAGNOSIS — R05 Cough: Secondary | ICD-10-CM

## 2017-06-02 MED ORDER — FLUCONAZOLE 150 MG PO TABS: 150.0000 mg | ORAL_TABLET | Freq: Once | ORAL | 0 refills | Status: AC

## 2017-06-02 MED ORDER — HYDROCODONE-HOMATROPINE 5-1.5 MG/5ML PO SYRP: 5.0000 mL | ORAL_SOLUTION | Freq: Three times a day (TID) | ORAL | 0 refills | Status: AC | PRN

## 2017-06-02 MED ORDER — AMOXICILLIN 500 MG PO CAPS: 1000.0000 mg | ORAL_CAPSULE | Freq: Two times a day (BID) | ORAL | 0 refills | Status: AC

## 2017-06-02 MED ORDER — HYDROCODONE-HOMATROPINE 5-1.5 MG/5ML PO SYRP: 5.0000 mL | ORAL_SOLUTION | Freq: Three times a day (TID) | ORAL | 0 refills | Status: DC | PRN

## 2017-06-02 NOTE — Telephone Encounter (Signed)
Please advise 

## 2017-06-02 NOTE — Telephone Encounter (Signed)
[facility] [address] faxed refill request for the following medications:  cloNIDine (CATAPRES - DOSED IN MG/24 HR) 0.1 mg/24hr patch  90 day supply  Last Rx: 11/30/16 LOV: 06/02/17 Please advise. Thanks TNP

## 2017-06-02 NOTE — Telephone Encounter (Signed)
Pt is requesting the Rx for HYDROcodone-homatropine (HYCODAN) 5-1.5 MG/5ML syrup resent to [facility].  XB#[phone]/HYCB#[phone]/NW

## 2017-06-02 NOTE — Progress Notes (Signed)
Patient: Daisy [name] Female    DOB: [birth year]   51 y.o.   MRN: [id number] Visit Date: 06/02/2017  Today's Provider: Mila Merry, MD   Chief Complaint  Patient presents with  . Cough    x 2 weeks   Subjective:    Cough  This is a new problem. Episode onset: 2 weeks ago. The problem has been gradually worsening. The cough is productive of sputum (yellow sputum). Associated symptoms include chills, ear pain, a fever (now resolved), headaches (right side), postnasal drip, a sore throat and wheezing. Pertinent negatives include no chest pain, nasal congestion (in right nostril), rhinorrhea or shortness of breath. Treatments tried: Tylenol, Mucinex and Alka-Seltzer Plus Cold and Flu. The treatment provided no relief.   She also states she has developed vaginal yeast infection since onset of above symptoms.      Allergies  Allergen Reactions  . Acetaminophen-Codeine Hives  . Codeine Hives  . Ibuprofen Nausea And Vomiting  . Morphine Nausea And Vomiting  . Propoxyphene Other (See Comments)    Reaction:  GI upset   . Sulfa Antibiotics Hives  . Duloxetine Rash and Other (See Comments)    Yellowing of the skin     Current Outpatient Medications:  .  albuterol (PROVENTIL HFA;VENTOLIN HFA) 108 (90 Base) MCG/ACT inhaler, Inhale 2 puffs into the lungs every 6 (six) hours as needed for wheezing or shortness of breath., Disp: , Rfl:  .  alprazolam (XANAX) 2 MG tablet, Take 2 mg by mouth 3 (three) times daily as needed for anxiety. , Disp: , Rfl:  .  amLODipine (NORVASC) 5 MG tablet, Take 1 tablet (5 mg total) by mouth daily., Disp: 90 tablet, Rfl: 3 .  atorvastatin (LIPITOR) 20 MG tablet, Take 1 tablet (20 mg total) by mouth daily., Disp: 30 tablet, Rfl: 2 .  cholecalciferol (VITAMIN D) 1000 units tablet, Take 1,000 Units by mouth daily., Disp: , Rfl:  .  cloNIDine (CATAPRES - DOSED IN MG/24 HR) 0.1 mg/24hr patch, PLACE 1 PATCH (0.1 MG TOTAL) ONTO THE SKIN ONCE A WEEK., Disp: 4  patch, Rfl: 6 .  FLUoxetine (PROZAC) 20 MG capsule, Take 1 capsule by mouth daily., Disp: , Rfl:  .  furosemide (LASIX) 20 MG tablet, Take 20 mg by mouth daily as needed for edema., Disp: , Rfl:  .  lamoTRIgine (LAMICTAL) 100 MG tablet, Take 1 tablet by mouth daily., Disp: , Rfl:  .  lisinopril (PRINIVIL,ZESTRIL) 20 MG tablet, TAKE 1 AND 1/2 TABLETS (30 MG TOTAL) BY MOUTH DAILY., Disp: 90 tablet, Rfl: 3 .  loratadine (CLARITIN) 10 MG tablet, Take 1 tablet (10 mg total) by mouth daily., Disp: 30 tablet, Rfl: 0 .  metFORMIN (GLUCOPHAGE) 500 MG tablet, Take 1 tablet (500 mg total) by mouth 2 (two) times daily with a meal., Disp: 180 tablet, Rfl: 4 .  metoprolol succinate (TOPROL-XL) 25 MG 24 hr tablet, Take 2 tablets (50 mg total) by mouth daily., Disp: 180 tablet, Rfl: 3 .  montelukast (SINGULAIR) 10 MG tablet, Take 1 tablet (10 mg total) by mouth at bedtime., Disp: 90 tablet, Rfl: 1 .  Oxycodone HCl 10 MG TABS, Take 10 mg by mouth as directed. 7 tablets daily if tolerated per Dr. Metta Clines, Disp: , Rfl:  .  pantoprazole (PROTONIX) 40 MG tablet, Take 1 tablet (40 mg total) by mouth 2 (two) times daily., Disp: 180 tablet, Rfl: 4 .  promethazine (PHENERGAN) 12.5 MG tablet, Take 1 tablet (12.5  mg total) by mouth every 6 (six) hours as needed for refractory nausea / vomiting., Disp: 30 tablet, Rfl: 3 .  protein supplement shake (PREMIER PROTEIN) LIQD, Take 325 mLs (11 oz total) by mouth 3 (three) times daily between meals., Disp: 30 Can, Rfl: 0 .  ranitidine (ZANTAC) 150 MG tablet, Take 150 mg by mouth at bedtime., Disp: , Rfl:  .  tiZANidine (ZANAFLEX) 4 MG tablet, Take 4 mg by mouth 3 (three) times daily. 1-3 tablets per day as tolerated per Dr. Metta Clinesrisp, Disp: , Rfl:  .  traZODone (DESYREL) 100 MG tablet, Take 100 mg by mouth at bedtime., Disp: , Rfl:  .  vitamin B-12 (CYANOCOBALAMIN) 1000 MCG tablet, Take 1,000 mcg by mouth daily., Disp: , Rfl:  .  zolpidem (AMBIEN) 10 MG tablet, Take 1 tablet by mouth at  bedtime as needed., Disp: , Rfl:   Review of Systems  Constitutional: Positive for chills, diaphoresis, fatigue and fever (now resolved). Negative for appetite change.  HENT: Positive for congestion, ear pain, facial swelling (on the right side of her face), postnasal drip and sore throat. Negative for rhinorrhea.   Respiratory: Positive for cough and wheezing. Negative for chest tightness and shortness of breath.   Cardiovascular: Negative for chest pain and palpitations.  Gastrointestinal: Negative for abdominal pain, nausea and vomiting.  Neurological: Positive for headaches (right side). Negative for dizziness and weakness.    Social History   Tobacco Use  . Smoking status: Never Smoker  . Smokeless tobacco: Former NeurosurgeonUser    Types: Snuff  Substance Use Topics  . Alcohol use: Yes    Alcohol/week: 0.0 oz    Comment: only on special occasions- wine   Objective:   BP 116/82 (BP Location: Left Arm, Patient Position: Sitting, Cuff Size: Large)   Pulse 83   Temp 98.1 F (36.7 C) (Oral)   Resp 18   Wt 288 lb (130.6 kg)   SpO2 95% Comment: room air  BMI 49.44 kg/m  There were no vitals filed for this visit.   Physical Exam  General Appearance:    Alert, cooperative, no distress  HENT:   left TM normal without fluid or infection, right TM red, dull, bulging, neck without nodes, neck has bilateral anterior cervical nodes enlarged, throat normal without erythema or exudate and nasal mucosa congested. Post nasal drainage noted.   Eyes:    PERRL, conjunctiva/corneas clear, EOM's intact       Lungs:     Clear to auscultation bilaterally, respirations unlabored  Heart:    Regular rate and rhythm  Neurologic:   Awake, alert, oriented x 3. No apparent focal neurological           defect.           Assessment & Plan:     1. Sinusitis, unspecified chronicity, unspecified location  - amoxicillin (AMOXIL) 500 MG capsule; Take 2 capsules (1,000 mg total) by mouth 2 (two) times daily for  10 days.  Dispense: 40 capsule; Refill: 0  2. Right otitis media, unspecified otitis media type   3. Cough She states she has taken tussionex and hydrocodone in the past with  No reaction despite history of allergy to codeine.  - HYDROcodone-homatropine (HYCODAN) 5-1.5 MG/5ML syrup; Take 5 mLs by mouth every 8 (eight) hours as needed for up to 5 days for cough.  Dispense: 100 mL; Refill: 0  4. Yeast vaginitis  - fluconazole (DIFLUCAN) 150 MG tablet; Take 1 tablet (150 mg total) by  mouth once for 1 dose.  Dispense: 1 tablet; Refill: 0       Mila Merryonald Jemmie Ledgerwood, MD  [facility]

## 2017-06-03 MED ORDER — CLONIDINE 0.1 MG/24HR TD PTWK: 0.1000 mg | MEDICATED_PATCH | TRANSDERMAL | 4 refills | Status: DC

## 2017-06-09 ENCOUNTER — Other Ambulatory Visit: Payer: Self-pay | Admitting: Family Medicine

## 2017-06-09 DIAGNOSIS — E119 Type 2 diabetes mellitus without complications: Secondary | ICD-10-CM

## 2017-06-09 MED ORDER — ATORVASTATIN CALCIUM 20 MG PO TABS: 20.0000 mg | ORAL_TABLET | Freq: Every day | ORAL | 4 refills | Status: DC

## 2017-06-09 NOTE — Telephone Encounter (Signed)
[facility]days supply for the following medication. Thanks CC ° °atorvastatin (LIPITOR) 20 MG tablet  ° °

## 2017-06-16 ENCOUNTER — Ambulatory Visit: Payer: Medicare Other | Admitting: Family Medicine

## 2017-06-17 ENCOUNTER — Ambulatory Visit (INDEPENDENT_AMBULATORY_CARE_PROVIDER_SITE_OTHER): Payer: Medicare Other | Admitting: Family Medicine

## 2017-06-17 ENCOUNTER — Encounter: Payer: Self-pay | Admitting: Family Medicine

## 2017-06-17 DIAGNOSIS — Z01818 Encounter for other preprocedural examination: Secondary | ICD-10-CM | POA: Diagnosis not present

## 2017-06-17 DIAGNOSIS — Z6841 Body Mass Index (BMI) 40.0 and over, adult: Secondary | ICD-10-CM | POA: Diagnosis not present

## 2017-06-17 MED ORDER — ROSUVASTATIN CALCIUM 10 MG PO TABS: 10.0000 mg | ORAL_TABLET | Freq: Every day | ORAL | 3 refills | Status: DC

## 2017-06-17 MED ORDER — HYDROCODONE-HOMATROPINE 5-1.5 MG/5ML PO SYRP: 5.0000 mL | ORAL_SOLUTION | Freq: Three times a day (TID) | ORAL | 0 refills | Status: DC | PRN

## 2017-06-17 MED ORDER — METRONIDAZOLE 500 MG PO TABS: 500.0000 mg | ORAL_TABLET | Freq: Three times a day (TID) | ORAL | 0 refills | Status: DC

## 2017-06-17 NOTE — Progress Notes (Addendum)
Patient: Daisy [name] Female    DOB: 11-13-1965   52 y.o.   MRN: [id number] Visit Date: 06/17/2017  Today's Provider: Mila Merry, MD   Chief Complaint  Patient presents with  . Follow-up  . Obesity   Subjective:    HPI   Morbid obesity (HCC) Visit #3 for pre-bariatric surgery diet and exercise plan. Baseline weight 04/15/2017 was 283#ontinue eating healthy food, but need to work on portion size. Encouraged frequent brief exercise as tolerated.she reports she is walking about 10 minutes every morning, but has to stop due to pain in back, hips and knees. States she eats small portions usually twice a day and does not consume sweets or starchy foods.   Wt Readings from Last 3 Encounters:  06/17/17 286 lb (129.7 kg)  06/02/17 288 lb (130.6 kg)  05/16/17 293 lb 3.2 oz (133 kg)      Allergies  Allergen Reactions  . Acetaminophen-Codeine Hives  . Codeine Hives  . Ibuprofen Nausea And Vomiting  . Morphine Nausea And Vomiting  . Propoxyphene Other (See Comments)    Reaction:  GI upset   . Sulfa Antibiotics Hives  . Duloxetine Rash and Other (See Comments)    Yellowing of the skin     Current Outpatient Medications:  .  albuterol (PROVENTIL HFA;VENTOLIN HFA) 108 (90 Base) MCG/ACT inhaler, Inhale 2 puffs into the lungs every 6 (six) hours as needed for wheezing or shortness of breath., Disp: , Rfl:  .  alprazolam (XANAX) 2 MG tablet, Take 2 mg by mouth 3 (three) times daily as needed for anxiety. , Disp: , Rfl:  .  amLODipine (NORVASC) 5 MG tablet, Take 1 tablet (5 mg total) by mouth daily., Disp: 90 tablet, Rfl: 3 .  cholecalciferol (VITAMIN D) 1000 units tablet, Take 1,000 Units by mouth daily., Disp: , Rfl:  .  cloNIDine (CATAPRES - DOSED IN MG/24 HR) 0.1 mg/24hr patch, PLACE 1 PATCH (0.1 MG TOTAL) ONTO THE SKIN ONCE A WEEK., Disp: 4 patch, Rfl: 6 .  cloNIDine (CATAPRES-TTS-1) 0.1 mg/24hr patch, Place 1 patch (0.1 mg total) onto the skin once a week., Disp: 12 patch,  Rfl: 4 .  FLUoxetine (PROZAC) 20 MG capsule, Take 1 capsule by mouth daily., Disp: , Rfl:  .  furosemide (LASIX) 20 MG tablet, Take 20 mg by mouth daily as needed for edema., Disp: , Rfl:  .  lamoTRIgine (LAMICTAL) 100 MG tablet, Take 1 tablet by mouth daily., Disp: , Rfl:  .  lisinopril (PRINIVIL,ZESTRIL) 20 MG tablet, TAKE 1 AND 1/2 TABLETS (30 MG TOTAL) BY MOUTH DAILY., Disp: 90 tablet, Rfl: 3 .  loratadine (CLARITIN) 10 MG tablet, Take 1 tablet (10 mg total) by mouth daily., Disp: 30 tablet, Rfl: 0 .  metFORMIN (GLUCOPHAGE) 500 MG tablet, Take 1 tablet (500 mg total) by mouth 2 (two) times daily with a meal., Disp: 180 tablet, Rfl: 4 .  metoprolol succinate (TOPROL-XL) 25 MG 24 hr tablet, Take 2 tablets (50 mg total) by mouth daily., Disp: 180 tablet, Rfl: 3 .  montelukast (SINGULAIR) 10 MG tablet, Take 1 tablet (10 mg total) by mouth at bedtime., Disp: 90 tablet, Rfl: 1 .  Oxycodone HCl 10 MG TABS, Take 10 mg by mouth as directed. 7 tablets daily if tolerated per Dr. Metta Clines, Disp: , Rfl:  .  pantoprazole (PROTONIX) 40 MG tablet, Take 1 tablet (40 mg total) by mouth 2 (two) times daily., Disp: 180 tablet, Rfl: 4 .  promethazine (PHENERGAN) 12.5 MG tablet, Take 1 tablet (12.5 mg total) by mouth every 6 (six) hours as needed for refractory nausea / vomiting., Disp: 30 tablet, Rfl: 3 .  protein supplement shake (PREMIER PROTEIN) LIQD, Take 325 mLs (11 oz total) by mouth 3 (three) times daily between meals., Disp: 30 Can, Rfl: 0 .  ranitidine (ZANTAC) 150 MG tablet, Take 150 mg by mouth at bedtime., Disp: , Rfl:  .  tiZANidine (ZANAFLEX) 4 MG tablet, Take 4 mg by mouth 3 (three) times daily. 1-3 tablets per day as tolerated per Dr. Metta Clinesrisp, Disp: , Rfl:  .  traZODone (DESYREL) 100 MG tablet, Take 100 mg by mouth at bedtime., Disp: , Rfl:  .  vitamin B-12 (CYANOCOBALAMIN) 1000 MCG tablet, Take 1,000 mcg by mouth daily., Disp: , Rfl:  .  zolpidem (AMBIEN) 10 MG tablet, Take 1 tablet by mouth at bedtime  as needed., Disp: , Rfl:  .  atorvastatin (LIPITOR) 20 MG tablet, Take 1 tablet (20 mg total) by mouth daily. (Patient not taking: Reported on 06/17/2017), Disp: 90 tablet, Rfl: 4  Review of Systems  Constitutional: Negative for appetite change, chills, fatigue and fever.  Respiratory: Negative for chest tightness and shortness of breath.   Cardiovascular: Negative for chest pain and palpitations.  Gastrointestinal: Negative for abdominal pain, nausea and vomiting.  Neurological: Negative for dizziness and weakness.    Social History   Tobacco Use  . Smoking status: Never Smoker  . Smokeless tobacco: Former NeurosurgeonUser    Types: Snuff  Substance Use Topics  . Alcohol use: Yes    Alcohol/week: 0.0 oz    Comment: only on special occasions- wine   Objective:   BP 100/60 (BP Location: Right Arm, Patient Position: Sitting, Cuff Size: Large)   Pulse (!) 113   Temp 97.9 F (36.6 C) (Oral)   Resp 16   Ht 5\' 4"  (1.626 m)   Wt 286 lb (129.7 kg)   SpO2 98%   BMI 49.09 kg/m  Vitals:   06/17/17 1144  BP: 100/60  Pulse: (!) 113  Resp: 16  Temp: 97.9 F (36.6 C)  TempSrc: Oral  SpO2: 98%  Weight: 286 lb (129.7 kg)  Height: 5\' 4"  (1.626 m)     Physical Exam  General Appearance:    Alert, cooperative, no distress, morbidly obese  Eyes:    PERRL, conjunctiva/corneas clear, EOM's intact       Lungs:     Clear to auscultation bilaterally, respirations unlabored  Heart:    Regular rate and rhythm  Neurologic:   Awake, alert, oriented x 3. No apparent focal neurological           defect.           Assessment & Plan:     1. Morbid obesity (HCC) Counseled to work up to 30 minutes a day of walking broken up into 3-4 episodes, continnue to eat small portions of low glycemic index foods and follow up in 1 month. Completed diet and exercise forms for bariatric surgery.        Mila Merryonald Fisher, MD  [facility]

## 2017-06-28 DIAGNOSIS — Z5181 Encounter for therapeutic drug level monitoring: Secondary | ICD-10-CM | POA: Diagnosis not present

## 2017-06-28 DIAGNOSIS — M5030 Other cervical disc degeneration, unspecified cervical region: Secondary | ICD-10-CM | POA: Diagnosis not present

## 2017-06-28 DIAGNOSIS — M5136 Other intervertebral disc degeneration, lumbar region: Secondary | ICD-10-CM | POA: Diagnosis not present

## 2017-06-28 DIAGNOSIS — M5481 Occipital neuralgia: Secondary | ICD-10-CM | POA: Diagnosis not present

## 2017-07-08 ENCOUNTER — Other Ambulatory Visit: Payer: Self-pay | Admitting: Family Medicine

## 2017-07-09 ENCOUNTER — Other Ambulatory Visit: Payer: Self-pay | Admitting: Family Medicine

## 2017-07-09 NOTE — Telephone Encounter (Signed)
[facility]-days supply for the following medication. Thanks CC ° °furosemide (LASIX) 20 MG tablet  ° °

## 2017-07-11 MED ORDER — FUROSEMIDE 20 MG PO TABS: 20.0000 mg | ORAL_TABLET | Freq: Every day | ORAL | 4 refills | Status: DC | PRN

## 2017-07-13 ENCOUNTER — Other Ambulatory Visit: Payer: Self-pay | Admitting: Family Medicine

## 2017-07-13 DIAGNOSIS — I10 Essential (primary) hypertension: Secondary | ICD-10-CM

## 2017-07-18 ENCOUNTER — Encounter: Payer: Self-pay | Admitting: Intensive Care

## 2017-07-18 ENCOUNTER — Emergency Department
Admission: EM | Admit: 2017-07-18 | Discharge: 2017-07-18 | Discharge status: Against Medical Advice | Payer: Medicare Other | Attending: Emergency Medicine | Admitting: Emergency Medicine

## 2017-07-18 ENCOUNTER — Emergency Department
Admission: EM | Admit: 2017-07-18 | Discharge: 2017-07-19 | Disposition: A | Discharge status: Home / Self Care | Payer: Medicare Other | Source: Home / Self Care | Attending: Emergency Medicine | Admitting: Emergency Medicine

## 2017-07-18 ENCOUNTER — Other Ambulatory Visit: Payer: Self-pay

## 2017-07-18 ENCOUNTER — Encounter: Payer: Self-pay | Admitting: *Deleted

## 2017-07-18 DIAGNOSIS — E119 Type 2 diabetes mellitus without complications: Secondary | ICD-10-CM | POA: Insufficient documentation

## 2017-07-18 DIAGNOSIS — R112 Nausea with vomiting, unspecified: Secondary | ICD-10-CM | POA: Insufficient documentation

## 2017-07-18 DIAGNOSIS — Z5321 Procedure and treatment not carried out due to patient leaving prior to being seen by health care provider: Secondary | ICD-10-CM | POA: Diagnosis not present

## 2017-07-18 DIAGNOSIS — Z7984 Long term (current) use of oral hypoglycemic drugs: Secondary | ICD-10-CM

## 2017-07-18 DIAGNOSIS — R109 Unspecified abdominal pain: Secondary | ICD-10-CM

## 2017-07-18 DIAGNOSIS — Z87891 Personal history of nicotine dependence: Secondary | ICD-10-CM | POA: Insufficient documentation

## 2017-07-18 DIAGNOSIS — Z79899 Other long term (current) drug therapy: Secondary | ICD-10-CM

## 2017-07-18 DIAGNOSIS — I10 Essential (primary) hypertension: Secondary | ICD-10-CM | POA: Insufficient documentation

## 2017-07-18 LAB — CBC
HEMATOCRIT: 42.7 % (ref 35.0–47.0)
HEMOGLOBIN: 14 g/dL (ref 12.0–16.0)
MCH: 28.6 pg (ref 26.0–34.0)
MCHC: 32.8 g/dL (ref 32.0–36.0)
MCV: 87.2 fL (ref 80.0–100.0)
Platelets: 375 10*3/uL (ref 150–440)
RBC: 4.9 MIL/uL (ref 3.80–5.20)
RDW: 16.2 % — ABNORMAL HIGH (ref 11.5–14.5)
WBC: 10.9 10*3/uL (ref 3.6–11.0)

## 2017-07-18 LAB — URINALYSIS, COMPLETE (UACMP) WITH MICROSCOPIC
Bilirubin Urine: NEGATIVE
Glucose, UA: NEGATIVE mg/dL
KETONES UR: 5 mg/dL — AB
Leukocytes, UA: NEGATIVE
NITRITE: NEGATIVE
PROTEIN: 100 mg/dL — AB
Specific Gravity, Urine: 1.012 (ref 1.005–1.030)
pH: 7 (ref 5.0–8.0)

## 2017-07-18 MED ORDER — ONDANSETRON 4 MG PO TBDP
4.0000 mg | ORAL_TABLET | Freq: Once | ORAL | Status: AC | PRN
  Administered 2017-07-18: 4 mg via ORAL
  Filled 2017-07-18: qty 1

## 2017-07-18 NOTE — ED Triage Notes (Addendum)
Patient arrived by EMS from home for N/V and abdominal pain since this AM. Patient actively vomiting green bile in triage

## 2017-07-18 NOTE — ED Notes (Signed)
Attempted butterfly stick X2. Patient difficult stick

## 2017-07-18 NOTE — ED Notes (Signed)
Pt called for a second time with no answer.

## 2017-07-18 NOTE — ED Notes (Signed)
Called in waiting room to redraw light green tube. No answer.

## 2017-07-18 NOTE — ED Notes (Signed)
Called in waiting room to redraw blood work.  No answer.

## 2017-07-18 NOTE — ED Notes (Signed)
Lab to triage to attempt blood draw, unsuccessful--will send another lab tech

## 2017-07-18 NOTE — ED Notes (Signed)
Charge nurse notified to call lab for blood draw

## 2017-07-18 NOTE — ED Notes (Signed)
2nd lab tech here for blood draw attempt; pt refuses at present

## 2017-07-18 NOTE — ED Notes (Signed)
Ems , abd pain , hth 207/120 , vomiting

## 2017-07-18 NOTE — ED Notes (Signed)
Pt called at this time for recollection of green tube, no answer.

## 2017-07-18 NOTE — ED Triage Notes (Signed)
Pt to triage via wheelchair. Pt reports vomiting since this morning.  Pt was here earlier today but left without being seen by a provider.  Pt alert. Pt vomiting in triage.

## 2017-07-19 ENCOUNTER — Emergency Department: Payer: Medicare Other

## 2017-07-19 ENCOUNTER — Ambulatory Visit: Payer: Medicare Other | Admitting: Family Medicine

## 2017-07-19 DIAGNOSIS — R112 Nausea with vomiting, unspecified: Secondary | ICD-10-CM | POA: Diagnosis not present

## 2017-07-19 DIAGNOSIS — R109 Unspecified abdominal pain: Secondary | ICD-10-CM | POA: Diagnosis not present

## 2017-07-19 LAB — CBC
HCT: 47.2 % — ABNORMAL HIGH (ref 35.0–47.0)
Hemoglobin: 15 g/dL (ref 12.0–16.0)
MCH: 27.6 pg (ref 26.0–34.0)
MCHC: 31.7 g/dL — ABNORMAL LOW (ref 32.0–36.0)
MCV: 87.3 fL (ref 80.0–100.0)
PLATELETS: 391 10*3/uL (ref 150–440)
RBC: 5.41 MIL/uL — AB (ref 3.80–5.20)
RDW: 16.1 % — ABNORMAL HIGH (ref 11.5–14.5)
WBC: 13.8 10*3/uL — AB (ref 3.6–11.0)

## 2017-07-19 LAB — COMPREHENSIVE METABOLIC PANEL
ALT: 28 U/L (ref 14–54)
AST: 26 U/L (ref 15–41)
Albumin: 4.8 g/dL (ref 3.5–5.0)
Alkaline Phosphatase: 66 U/L (ref 38–126)
Anion gap: 16 — ABNORMAL HIGH (ref 5–15)
BUN: 8 mg/dL (ref 6–20)
CO2: 23 mmol/L (ref 22–32)
CREATININE: 0.8 mg/dL (ref 0.44–1.00)
Calcium: 9.4 mg/dL (ref 8.9–10.3)
Chloride: 98 mmol/L — ABNORMAL LOW (ref 101–111)
Glucose, Bld: 174 mg/dL — ABNORMAL HIGH (ref 65–99)
Potassium: 3.7 mmol/L (ref 3.5–5.1)
Sodium: 137 mmol/L (ref 135–145)
TOTAL PROTEIN: 9.4 g/dL — AB (ref 6.5–8.1)
Total Bilirubin: 1 mg/dL (ref 0.3–1.2)

## 2017-07-19 LAB — LIPASE, BLOOD: LIPASE: 20 U/L (ref 11–51)

## 2017-07-19 MED ORDER — ONDANSETRON HCL 4 MG/2ML IJ SOLN
INTRAMUSCULAR | Status: AC
  Filled 2017-07-19: qty 2

## 2017-07-19 MED ORDER — PROMETHAZINE HCL 25 MG/ML IJ SOLN
12.5000 mg | Freq: Once | INTRAMUSCULAR | Status: AC
  Administered 2017-07-19: 12.5 mg via INTRAVENOUS
  Filled 2017-07-19: qty 1

## 2017-07-19 MED ORDER — DICYCLOMINE HCL 10 MG/ML IM SOLN
20.0000 mg | Freq: Once | INTRAMUSCULAR | Status: AC
  Administered 2017-07-19: 20 mg via INTRAMUSCULAR
  Filled 2017-07-19: qty 2

## 2017-07-19 MED ORDER — SODIUM CHLORIDE 0.9 % IV BOLUS (SEPSIS)
1000.0000 mL | INTRAVENOUS | Status: AC
  Administered 2017-07-19: 1000 mL via INTRAVENOUS

## 2017-07-19 MED ORDER — DIPHENHYDRAMINE HCL 50 MG/ML IJ SOLN
25.0000 mg | INTRAMUSCULAR | Status: AC
  Administered 2017-07-19: 25 mg via INTRAVENOUS
  Filled 2017-07-19: qty 1

## 2017-07-19 MED ORDER — HALOPERIDOL LACTATE 5 MG/ML IJ SOLN
5.0000 mg | Freq: Once | INTRAMUSCULAR | Status: AC
  Administered 2017-07-19: 5 mg via INTRAVENOUS
  Filled 2017-07-19: qty 1

## 2017-07-19 MED ORDER — ONDANSETRON HCL 4 MG/2ML IJ SOLN
4.0000 mg | Freq: Once | INTRAMUSCULAR | Status: AC
  Administered 2017-07-19: 4 mg via INTRAVENOUS

## 2017-07-19 NOTE — ED Provider Notes (Signed)
[facility] Emergency Department Provider Note  ____________________________________________   First MD Initiated Contact with Patient 07/19/17 0040     (approximate)  I have reviewed the triage vital signs and the nursing notes.   HISTORY  Chief Complaint Emesis and Abdominal Pain    HPI Daisy [name] is a 52 y.o. female with a history that includes multiple episodes of gastroparesis with gastritis, morbid obesity, multiple abdominal wall hernias, and several prior admissions for these symptoms as well as for uncontrolled hypertension due to not being able to tolerate her oral medications.  She presents tonight with a chief complaint of acute onset abdominal pain and multiple episodes of vomiting.  She was observed to vomit several times in the emergency department.  She reports the symptoms are similar to her prior episodes of gastroparesis and/or gastritis.  She reports that her abdominal pain is moderate at rest and severe with vomiting.  Nothing in particular makes her symptoms better.  She is hypertensive tonight as she has been prior when she is not able to tolerate her oral antihypertensives.  She denies headache, lightheadedness, fever, chest pain, shortness of breath.  She did not  Tell me this, but it is well documented in her notes that she reports that the only thing that helps her discomfort from gastroparesis is Dilaudid and Phenergan.   Past Medical History:  Diagnosis Date  . Diabetes mellitus without complication (HCC)   . Gastritis   . Gastroparesis   . Hernia of abdominal wall   . Hypertension   . Migraine     Patient Active Problem List   Diagnosis Date Noted  . Allergic rhinitis 04/18/2017  . Gastroparesis 01/03/2017  . Hyperlipidemia 06/18/2016  . Controlled type 2 diabetes mellitus without complication, without long-term current use of insulin (HCC) 06/18/2016  . Bipolar 2 disorder (HCC) 06/18/2016  . Depression 06/18/2016  .  GERD (gastroesophageal reflux disease) 06/18/2016  . Hypertension 06/18/2016  . Ingrown toenail 06/18/2016  . Hypokalemia 09/28/2015  . Chronic abdominal pain 05/23/2015  . Complex ovarian cyst 05/23/2015  . Anxiety 03/27/2015  . Insomnia 03/27/2015  . Gastritis 01/07/2015  . DDD (degenerative disc disease), lumbar 11/28/2014  . DJD (degenerative joint disease) of knee 11/28/2014  . Migraine headache 11/28/2014  . Bilateral occipital neuralgia 11/28/2014  . Morbid obesity (HCC) 11/28/2014  . Neuropathy due to secondary diabetes (HCC) 11/28/2014  . Abdominal wall abscess 09/20/2014  . Back pain, chronic 09/20/2014  . Incisional hernia, without obstruction or gangrene 04/30/2014  . Bilateral chronic knee pain 03/04/2014    Past Surgical History:  Procedure Laterality Date  . ABDOMINAL HYSTERECTOMY    . APPENDECTOMY    . APPLICATION OF WOUND VAC    . CHOLECYSTECTOMY    . HERNIA REPAIR    . KNEE SURGERY      Prior to Admission medications   Medication Sig Start Date End Date Taking? Authorizing Provider  albuterol (PROVENTIL HFA;VENTOLIN HFA) 108 (90 Base) MCG/ACT inhaler Inhale 2 puffs into the lungs every 6 (six) hours as needed for wheezing or shortness of breath.    [provider]  alprazolam Prudy Feeler) 2 MG tablet Take 2 mg by mouth 3 (three) times daily as needed for anxiety.     [provider]  amLODipine (NORVASC) 5 MG tablet TAKE 1 TABLET (5 MG TOTAL) BY MOUTH DAILY. 07/13/17   Malva Limes, MD  atorvastatin (LIPITOR) 20 MG tablet Take 1 tablet (20 mg total) by mouth  daily. Patient not taking: Reported on 06/17/2017 06/09/17   Malva LimesFisher, Donald E, MD  cholecalciferol (VITAMIN D) 1000 units tablet Take 1,000 Units by mouth daily.    [provider]  cloNIDine (CATAPRES - DOSED IN MG/24 HR) 0.1 mg/24hr patch PLACE 1 PATCH (0.1 MG TOTAL) ONTO THE SKIN ONCE A WEEK. 11/30/16   Malva LimesFisher, Donald E, MD  cloNIDine (CATAPRES-TTS-1) 0.1 mg/24hr patch Place 1  patch (0.1 mg total) onto the skin once a week. 06/03/17   Malva LimesFisher, Donald E, MD  FLUoxetine (PROZAC) 20 MG capsule Take 1 capsule by mouth daily.    [provider]  furosemide (LASIX) 20 MG tablet Take 1 tablet (20 mg total) by mouth daily as needed for edema. 07/11/17   Malva LimesFisher, Donald E, MD  HYDROcodone-homatropine [facility](HYCODAN) 5-1.5 MG/5ML syrup Take 5 mLs by mouth every 8 (eight) hours as needed for cough. 06/17/17   Malva LimesFisher, Donald E, MD  lamoTRIgine (LAMICTAL) 100 MG tablet Take 1 tablet by mouth daily.    [provider]  lisinopril (PRINIVIL,ZESTRIL) 20 MG tablet TAKE 1 AND 1/2 TABLETS (30 MG TOTAL) BY MOUTH DAILY. 06/01/17   Malva LimesFisher, Donald E, MD  loratadine (CLARITIN) 10 MG tablet Take 1 tablet (10 mg total) by mouth daily. 03/18/17   Enedina FinnerPatel, Sona, MD  metFORMIN (GLUCOPHAGE) 500 MG tablet Take 1 tablet (500 mg total) by mouth 2 (two) times daily with a meal. 04/15/17   Malva LimesFisher, Donald E, MD  metoprolol succinate (TOPROL-XL) 25 MG 24 hr tablet Take 2 tablets (50 mg total) by mouth daily. 06/18/16   Malva LimesFisher, Donald E, MD  metroNIDAZOLE (FLAGYL) 500 MG tablet Take 1 tablet (500 mg total) by mouth 3 (three) times daily. 06/17/17   Malva LimesFisher, Donald E, MD  montelukast (SINGULAIR) 10 MG tablet Take 1 tablet (10 mg total) by mouth at bedtime. 04/15/17   Malva LimesFisher, Donald E, MD  Oxycodone HCl 10 MG TABS Take 10 mg by mouth as directed. 7 tablets daily if tolerated per Dr. Metta Clinesrisp 03/08/17   [provider]  pantoprazole (PROTONIX) 40 MG tablet Take 1 tablet (40 mg total) by mouth 2 (two) times daily. 11/10/16   Malva LimesFisher, Donald E, MD  promethazine (PHENERGAN) 12.5 MG tablet Take 1 tablet (12.5 mg total) by mouth every 6 (six) hours as needed for refractory nausea / vomiting. 03/04/17   Malva LimesFisher, Donald E, MD  protein supplement shake (PREMIER PROTEIN) LIQD Take 325 mLs (11 oz total) by mouth 3 (three) times daily between meals. 03/18/17   Enedina FinnerPatel, Sona, MD  ranitidine (ZANTAC) 150 MG tablet TAKE 1 TABLET  (150 MG TOTAL) BY MOUTH AT BEDTIME. 07/08/17   Malva LimesFisher, Donald E, MD  rosuvastatin (CRESTOR) 10 MG tablet Take 1 tablet (10 mg total) by mouth daily. 06/17/17   Malva LimesFisher, Donald E, MD  tiZANidine (ZANAFLEX) 4 MG tablet Take 4 mg by mouth 3 (three) times daily. 1-3 tablets per day as tolerated per Dr. Metta Clinesrisp    [provider]  traZODone (DESYREL) 100 MG tablet Take 100 mg by mouth at bedtime. 10/12/16   [provider]  vitamin B-12 (CYANOCOBALAMIN) 1000 MCG tablet Take 1,000 mcg by mouth daily.    [provider]  zolpidem (AMBIEN) 10 MG tablet Take 1 tablet by mouth at bedtime as needed. 03/23/17   [provider]    Allergies Acetaminophen-codeine; Atorvastatin; Codeine; Ibuprofen; Morphine; Propoxyphene; Sulfa antibiotics; and Duloxetine  Family History  Problem Relation Age of Onset  . Arthritis Mother   . Depression  Mother   . Diabetes Mother   . Heart disease Mother   . Hyperlipidemia Mother   . Hypertension Mother   . Stroke Mother   . Arthritis Father   . Diabetes Father   . Hearing loss Father   . Heart disease Father   . Hyperlipidemia Father   . Hypertension Father     Social History Social History   Tobacco Use  . Smoking status: Never Smoker  . Smokeless tobacco: Former Neurosurgeon    Types: Snuff  Substance Use Topics  . Alcohol use: Yes    Alcohol/week: 0.0 oz    Comment: only on special occasions- wine  . Drug use: No    Review of Systems Constitutional: No fever/chills Eyes: No visual changes. ENT: No sore throat. Cardiovascular: Denies chest pain. Respiratory: Denies shortness of breath. Gastrointestinal: Abdominal pain with persistent vomiting similar to prior gastroparesis episodes. Genitourinary: Negative for dysuria. Musculoskeletal: Negative for neck pain.  Negative for back pain. Integumentary: Negative for rash. Neurological: Negative for headaches, focal weakness or  numbness.   ____________________________________________   PHYSICAL EXAM:  VITAL SIGNS: ED Triage Vitals  Enc Vitals Group     BP 07/18/17 2146 (!) 200/156     Pulse Rate 07/18/17 2146 87     Resp 07/18/17 2146 20     Temp 07/18/17 2146 99.4 F (37.4 C)     Temp Source 07/18/17 2146 Oral     SpO2 07/18/17 2146 100 %     Weight 07/18/17 2147 128.4 kg (283 lb)     Height 07/18/17 2147 1.651 m (5\' 5" )     Head Circumference --      Peak Flow --      Pain Score 07/18/17 2147 10     Pain Loc --      Pain Edu? --      Excl. in GC? --     Constitutional: Alert and oriented. Well appearing and in no acute distress. Eyes: Conjunctivae are normal.  Head: Atraumatic. Nose: No congestion/rhinnorhea. Mouth/Throat: Mucous membranes are moist. Neck: No stridor.  No meningeal signs.   Cardiovascular: Normal rate, regular rhythm. Good peripheral circulation. Grossly normal heart sounds. Respiratory: Normal respiratory effort.  No retractions. Lungs CTAB. Gastrointestinal: Morbid obesity.  Soft with diffuse tenderness throughout but no peritonitis.  When the patient bears down or strains, I can see a small, soft, and easily reducible abdominal wall hernia on the right side of her upper abdomen.  No concern for strangulate and or incarceration.  Exam is somewhat limited by the patient's body habitus. Musculoskeletal: No lower extremity tenderness nor edema. No gross deformities of extremities. Neurologic:  Normal speech and language. No gross focal neurologic deficits are appreciated.  Skin:  Skin is warm, dry and intact. No rash noted. Psychiatric: Mood and affect are normal. Speech and behavior are normal.  ____________________________________________   LABS (all labs ordered are listed, but only abnormal results are displayed)  Labs Reviewed  COMPREHENSIVE METABOLIC PANEL - Abnormal; Notable for the following components:      Result Value   Chloride 98 (*)    Glucose, Bld 174 (*)     Total Protein 9.4 (*)    Anion gap 16 (*)    All other components within normal limits  CBC - Abnormal; Notable for the following components:   WBC 13.8 (*)    RBC 5.41 (*)    HCT 47.2 (*)    MCHC 31.7 (*)    RDW 16.1 (*)  All other components within normal limits  LIPASE, BLOOD   ____________________________________________  EKG  None - EKG not ordered by ED physician ____________________________________________  RADIOLOGY I, Loleta Rose, personally viewed and evaluated these images (plain radiographs) as part of my medical decision making, as well as reviewing the written report by the radiologist.*  ED MD interpretation:  No acute abnormalities on plain films  Official radiology report(s): Dg Abdomen Acute W/chest  Result Date: 07/19/2017 CLINICAL DATA:  Chronic abdominal pain with nausea vomiting EXAM: DG ABDOMEN ACUTE W/ 1V CHEST COMPARISON:  CT 03/16/2017, radiograph 03/21/2016 FINDINGS: Single-view chest demonstrates no acute consolidation or effusion. Borderline cardiomegaly. No pneumothorax. Supine and upright views of the abdomen demonstrate no free air beneath the diaphragm. Surgical clips in the right upper quadrant. Nonobstructed bowel-gas pattern with mild stool. Calcified phleboliths in the pelvis. Right paraspinal calcifications are also unchanged. IMPRESSION: 1. No radiographic evidence for acute cardiopulmonary abnormality. Borderline cardiomegaly 2. Nonobstructed gas pattern Electronically Signed   By: Jasmine Pang M.D.   On: 07/19/2017 01:47    ____________________________________________   PROCEDURES  Critical Care performed: No   Procedure(s) performed:   Procedures   ____________________________________________   INITIAL IMPRESSION / ASSESSMENT AND PLAN / ED COURSE  As part of my medical decision making, I reviewed the following data within the electronic MEDICAL RECORD NUMBER Nursing notes reviewed and incorporated, Labs reviewed , Old chart  reviewed, Radiograph reviewed  and Notes from prior ED visits    Differential diagnosis includes, but is not limited to, ovarian cyst, ovarian torsion, acute appendicitis, diverticulitis, urinary tract infection/pyelonephritis, endometriosis, bowel obstruction, colitis, renal colic, gastroenteritis, hernia, fibroids, endometriosis, pregnancy related pain including ectopic pregnancy, etc.  However, based on her history, I suspect she is having another episode of gastroparesis.  There is nothing on her physical exam to suggest incarceration or strangulation of her abdominal wall hernia.  She is extremely hypertensive but she has been in the past and I saw in her past medical record that when she is given hydralazine on top of her oral meds, she drops very rapidly and becomes hypotensive; two prior hospitalization discharge summaries have mentioned this.  I am not concerned about hypertensive emergency at this time; I will attempt to control her nausea and vomiting and see if we can have her take her regular medications.  Given the nature of her chronic and episodic issues, I think that a good choice for her gastroparesis would be Haldol as well as some Benadryl to help avoid medication side effects.  For both a more acute affect and longer acting relief, I will provide 2.5 mg of Haldol as a slow IV push and put the other 2-1/2 mg and a bag of normal saline which she will get as a bolus over an hour.  Benadryl 25 mg IV as well.  There is no indication for CT scan at this time but I will check an acute abdomen series to make sure there is no obvious sign of volvulus or SBO although I find it very unlikely based on the physical exam.  Clinical Course as of Jul 20 527  Tue Jul 19, 2017  0239 Reassuring radiographs with no evidence of any acute processes. DG Abdomen Acute W/Chest [CF]  0347 Patient slept comfortably for awhile, now is back awake and reporting persistent symptoms.  She says she has "gotten  sick" two more times, but there is no evidence of any emesis.  I will provide Phenergan, but will continue to  try and avoid narcotics since this is unlikely to help gastroparesis.  [CF]  [phone] Will also try Bentyl 20 mg IM.  [CF]  [phone] Patient reports that she feels much better and is ready to go home.  I gave my usual and customary return precautions.   [CF]    Clinical Course User Index [CF] Loleta Rose, MD    ____________________________________________  FINAL CLINICAL IMPRESSION(S) / ED DIAGNOSES  Final diagnoses:  Nausea and vomiting, intractability of vomiting not specified, unspecified vomiting type  Abdominal pain, unspecified abdominal location     MEDICATIONS GIVEN DURING THIS VISIT:  Medications  ondansetron (ZOFRAN) injection 4 mg (4 mg Intravenous Given 07/19/17 0054)  haloperidol lactate (HALDOL) injection 5 mg (5 mg Intravenous Given 07/19/17 0141)  sodium chloride 0.9 % bolus 1,000 mL (0 mLs Intravenous Stopped 07/19/17 0250)  diphenhydrAMINE (BENADRYL) injection 25 mg (25 mg Intravenous Given 07/19/17 0142)  promethazine (PHENERGAN) injection 12.5 mg (12.5 mg Intravenous Given 07/19/17 0420)  dicyclomine (BENTYL) injection 20 mg (20 mg Intramuscular Given 07/19/17 0420)     ED Discharge Orders    None       Note:  This document was prepared using Dragon voice recognition software and may include unintentional dictation errors.    Loleta Rose, MD 07/19/17 0530

## 2017-07-19 NOTE — Discharge Instructions (Signed)
As we discussed, your workup today was reassuring.  Though we do not know exactly what is causing your symptoms, it appears that you have no emergent medical condition at this time and that you are safe to go home and follow up as recommended in this paperwork. ° °Please return immediately to the Emergency Department if you develop any new or worsening symptoms that concern you. ° °

## 2017-07-26 DIAGNOSIS — M5481 Occipital neuralgia: Secondary | ICD-10-CM | POA: Diagnosis not present

## 2017-07-26 DIAGNOSIS — M5136 Other intervertebral disc degeneration, lumbar region: Secondary | ICD-10-CM | POA: Diagnosis not present

## 2017-07-26 DIAGNOSIS — M5030 Other cervical disc degeneration, unspecified cervical region: Secondary | ICD-10-CM | POA: Diagnosis not present

## 2017-07-26 DIAGNOSIS — Z5181 Encounter for therapeutic drug level monitoring: Secondary | ICD-10-CM | POA: Diagnosis not present

## 2017-07-28 DIAGNOSIS — Z79899 Other long term (current) drug therapy: Secondary | ICD-10-CM | POA: Diagnosis not present

## 2017-08-15 ENCOUNTER — Ambulatory Visit (INDEPENDENT_AMBULATORY_CARE_PROVIDER_SITE_OTHER): Payer: Medicare Other | Admitting: Family Medicine

## 2017-08-15 ENCOUNTER — Encounter: Payer: Self-pay | Admitting: Family Medicine

## 2017-08-15 VITALS — BP 120/74 | HR 78 | Temp 98.5°F | Resp 16 | Ht 64.0 in | Wt 285.0 lb

## 2017-08-15 DIAGNOSIS — E119 Type 2 diabetes mellitus without complications: Secondary | ICD-10-CM | POA: Diagnosis not present

## 2017-08-15 LAB — POCT GLYCOSYLATED HEMOGLOBIN (HGB A1C)
Est. average glucose Bld gHb Est-mCnc: 128
Hemoglobin A1C: 6.1

## 2017-08-15 NOTE — Progress Notes (Addendum)
Patient: Daisy GoslingDanita A Balbuena Female    DOB:  01, 1967   52 y.o.   MRN: [id number] Visit Date: 08/15/2017  Today's Provider: Mila Merryonald Fisher, MD   Chief Complaint  Patient presents with  . Follow-up  . Diabetes   Subjective:    HPI   Diabetes Mellitus Type II, Follow-up:   Lab Results  Component Value Date   HGBA1C 6.1 04/15/2017   HGBA1C 5.9 (H) 09/14/2016   HGBA1C 6.2 (H) 03/22/2016   Last seen for diabetes 3 months ago.  Management since then includes; started statin for diabetic benefits . She reports good compliance with treatment. She is not having side effects. none Current symptoms include none and have been unchanged. Home blood sugar records: fasting range: 140  Episodes of hypoglycemia? no   Current Insulin Regimen: n/a Most Recent Eye Exam: 1 yr ago Weight trend: stable Prior visit with dietician: no Current diet: in general, an "unhealthy" diet Current exercise: none   Has follow up with surgery tomorrow. She is interested in gastric sleeve but does not want gastric bypass.   Wt Readings from Last 3 Encounters:  08/15/17 285 lb (129.3 kg)  07/18/17 283 lb (128.4 kg)  07/18/17 283 lb (128.4 kg)    ________________________________    Hypertension, follow-up:  BP Readings from Last 3 Encounters:  08/15/17 120/74  07/19/17 (!) 160/105  07/18/17 (!) 244/118    She was last seen for hypertension 4 months ago.  BP at that visit was 148/100. Management since that visit includes; advised to work on loss.She reports good compliance with treatment. She is not having side effects. none She is not exercising. She is adherent to low salt diet.   Outside blood pressures are none. She is experiencing none.  Patient denies none.   Cardiovascular risk factors include diabetes mellitus.  Use of agents associated with hypertension: none.  ____________________________________   Morbid obesity (HCC) From 06/17/2017-Counseled to work up to 30 minutes a  day of walking broken up into 3-4 episodes, continnue to eat small portions of low glycemic index foods and follow up in 1 month. Completed diet and exercise forms for bariatric surgery.         Allergies  Allergen Reactions  . Acetaminophen-Codeine Hives  . Atorvastatin Other (See Comments)    Muscle pain  . Codeine Hives  . Ibuprofen Nausea And Vomiting  . Morphine Nausea And Vomiting  . Propoxyphene Other (See Comments)    Reaction:  GI upset   . Sulfa Antibiotics Hives  . Duloxetine Rash and Other (See Comments)    Yellowing of the skin     Current Outpatient Medications:  .  albuterol (PROVENTIL HFA;VENTOLIN HFA) 108 (90 Base) MCG/ACT inhaler, Inhale 2 puffs into the lungs every 6 (six) hours as needed for wheezing or shortness of breath., Disp: , Rfl:  .  alprazolam (XANAX) 2 MG tablet, Take 2 mg by mouth 3 (three) times daily as needed for anxiety. , Disp: , Rfl:  .  amLODipine (NORVASC) 5 MG tablet, TAKE 1 TABLET (5 MG TOTAL) BY MOUTH DAILY., Disp: 90 tablet, Rfl: 4 .  cholecalciferol (VITAMIN D) 1000 units tablet, Take 1,000 Units by mouth daily., Disp: , Rfl:  .  cloNIDine (CATAPRES - DOSED IN MG/24 HR) 0.1 mg/24hr patch, PLACE 1 PATCH (0.1 MG TOTAL) ONTO THE SKIN ONCE A WEEK., Disp: 4 patch, Rfl: 6 .  cloNIDine (CATAPRES-TTS-1) 0.1 mg/24hr patch, Place 1 patch (0.1 mg total) onto the  skin once a week., Disp: 12 patch, Rfl: 4 .  FLUoxetine (PROZAC) 20 MG capsule, Take 1 capsule by mouth daily., Disp: , Rfl:  .  furosemide (LASIX) 20 MG tablet, Take 1 tablet (20 mg total) by mouth daily as needed for edema., Disp: 90 tablet, Rfl: 4 .  HYDROcodone-homatropine (HYCODAN) 5-1.5 MG/5ML syrup, Take 5 mLs by mouth every 8 (eight) hours as needed for cough., Disp: 120 mL, Rfl: 0 .  lamoTRIgine (LAMICTAL) 100 MG tablet, Take 1 tablet by mouth daily., Disp: , Rfl:  .  lisinopril (PRINIVIL,ZESTRIL) 20 MG tablet, TAKE 1 AND 1/2 TABLETS (30 MG TOTAL) BY MOUTH DAILY., Disp: 90 tablet,  Rfl: 3 .  loratadine (CLARITIN) 10 MG tablet, Take 1 tablet (10 mg total) by mouth daily., Disp: 30 tablet, Rfl: 0 .  metFORMIN (GLUCOPHAGE) 500 MG tablet, Take 1 tablet (500 mg total) by mouth 2 (two) times daily with a meal., Disp: 180 tablet, Rfl: 4 .  metoprolol succinate (TOPROL-XL) 25 MG 24 hr tablet, Take 2 tablets (50 mg total) by mouth daily., Disp: 180 tablet, Rfl: 3 .  montelukast (SINGULAIR) 10 MG tablet, Take 1 tablet (10 mg total) by mouth at bedtime., Disp: 90 tablet, Rfl: 1 .  Oxycodone HCl 10 MG TABS, Take 10 mg by mouth as directed. 7 tablets daily if tolerated per Dr. Metta Clines, Disp: , Rfl:  .  pantoprazole (PROTONIX) 40 MG tablet, Take 1 tablet (40 mg total) by mouth 2 (two) times daily., Disp: 180 tablet, Rfl: 4 .  promethazine (PHENERGAN) 12.5 MG tablet, Take 1 tablet (12.5 mg total) by mouth every 6 (six) hours as needed for refractory nausea / vomiting., Disp: 30 tablet, Rfl: 3 .  protein supplement shake (PREMIER PROTEIN) LIQD, Take 325 mLs (11 oz total) by mouth 3 (three) times daily between meals., Disp: 30 Can, Rfl: 0 .  ranitidine (ZANTAC) 150 MG tablet, TAKE 1 TABLET (150 MG TOTAL) BY MOUTH AT BEDTIME., Disp: 90 tablet, Rfl: 4 .  tiZANidine (ZANAFLEX) 4 MG tablet, Take 4 mg by mouth 3 (three) times daily. 1-3 tablets per day as tolerated per Dr. Metta Clines, Disp: , Rfl:  .  traZODone (DESYREL) 100 MG tablet, Take 100 mg by mouth at bedtime., Disp: , Rfl:  .  vitamin B-12 (CYANOCOBALAMIN) 1000 MCG tablet, Take 1,000 mcg by mouth daily., Disp: , Rfl:  .  zolpidem (AMBIEN) 10 MG tablet, Take 1 tablet by mouth at bedtime as needed., Disp: , Rfl:  .  rosuvastatin (CRESTOR) 10 MG tablet, Take 1 tablet (10 mg total) by mouth daily. (Patient not taking: Reported on 08/15/2017), Disp: 30 tablet, Rfl: 3  Review of Systems  Constitutional: Negative for appetite change, chills, fatigue and fever.  Respiratory: Negative for chest tightness and shortness of breath.   Cardiovascular: Negative  for chest pain and palpitations.  Gastrointestinal: Negative for abdominal pain, nausea and vomiting.  Neurological: Negative for dizziness and weakness.    Social History   Tobacco Use  . Smoking status: Never Smoker  . Smokeless tobacco: Former Neurosurgeon    Types: Snuff  Substance Use Topics  . Alcohol use: Yes    Alcohol/week: 0.0 oz    Comment: only on special occasions- wine   Objective:   BP 120/74 (BP Location: Right Arm, Patient Position: Sitting, Cuff Size: Large)   Pulse 78   Temp 98.5 F (36.9 C) (Oral)   Resp 16   Ht 5\' 4"  (1.626 m)   Wt 285 lb (129.3 kg)  SpO2 97%   BMI 48.92 kg/m  Vitals:   08/15/17 1128  BP: 120/74  Pulse: 78  Resp: 16  Temp: 98.5 F (36.9 C)  TempSrc: Oral  SpO2: 97%  Weight: 285 lb (129.3 kg)  Height: 5\' 4"  (1.626 m)     Physical Exam  General Appearance:    Alert, cooperative, no distress, obese  Eyes:    PERRL, conjunctiva/corneas clear, EOM's intact       Lungs:     Clear to auscultation bilaterally, respirations unlabored  Heart:    Regular rate and rhythm  Neurologic:   Awake, alert, oriented x 3. No apparent focal neurological           defect.       Results for orders placed or performed in visit on 08/15/17  POCT glycosylated hemoglobin (Hb A1C)  Result Value Ref Range   Hemoglobin A1C 6.1    Est. average glucose Bld gHb Est-mCnc 128     Wt Readings from Last 5 Encounters:  08/15/17 285 lb (129.3 kg)  07/18/17 283 lb (128.4 kg)  07/18/17 283 lb (128.4 kg)  06/17/17 286 lb (129.7 kg)  06/02/17 288 lb (130.6 kg)       Assessment & Plan:     1. Controlled type 2 diabetes mellitus without complication, without long-term current use of insulin (HCC) Well controlled. Is due for diabetic eye exam.  - POCT glycosylated hemoglobin (Hb A1C) - Ambulatory referral to Ophthalmology  2. Morbid obesity (HCC) 2 pound weight gain since last visit. She continues to adhere to low calorie diet but exercise is limited by pain.  Encouraged to continue to gradually increase daily exercise regiment.   Return for weight check one month.  Follow up bariatric surgery as scheduled.  Return in about 4 months (around 12/15/2017) for diabetes.       Mila Merry, MD  [facility]

## 2017-08-15 NOTE — Patient Instructions (Addendum)
   Please contact your eyecare professional to schedule a routine eye exam   Start taking Rosuvastatin 10mg  once every day. Call if you have any reactions to this medication

## 2017-08-23 DIAGNOSIS — M5030 Other cervical disc degeneration, unspecified cervical region: Secondary | ICD-10-CM | POA: Diagnosis not present

## 2017-08-23 DIAGNOSIS — M5481 Occipital neuralgia: Secondary | ICD-10-CM | POA: Diagnosis not present

## 2017-08-23 DIAGNOSIS — M5136 Other intervertebral disc degeneration, lumbar region: Secondary | ICD-10-CM | POA: Diagnosis not present

## 2017-08-23 DIAGNOSIS — Z5181 Encounter for therapeutic drug level monitoring: Secondary | ICD-10-CM | POA: Diagnosis not present

## 2017-08-31 ENCOUNTER — Other Ambulatory Visit: Payer: Self-pay | Admitting: Family Medicine

## 2017-08-31 DIAGNOSIS — I10 Essential (primary) hypertension: Secondary | ICD-10-CM

## 2017-08-31 NOTE — Telephone Encounter (Signed)
[facility] faxed refill request for following medications:lisinopril (PRINIVIL,ZESTRIL) 20 MG tablet      Please advise,Thanks Guadalupe

## 2017-09-01 MED ORDER — LISINOPRIL 20 MG PO TABS: ORAL_TABLET | ORAL | 4 refills | Status: DC

## 2017-09-05 ENCOUNTER — Other Ambulatory Visit: Payer: Self-pay | Admitting: Family Medicine

## 2017-09-05 DIAGNOSIS — I10 Essential (primary) hypertension: Secondary | ICD-10-CM

## 2017-09-20 DIAGNOSIS — M5136 Other intervertebral disc degeneration, lumbar region: Secondary | ICD-10-CM | POA: Diagnosis not present

## 2017-09-20 DIAGNOSIS — M5481 Occipital neuralgia: Secondary | ICD-10-CM | POA: Diagnosis not present

## 2017-09-20 DIAGNOSIS — Z5181 Encounter for therapeutic drug level monitoring: Secondary | ICD-10-CM | POA: Diagnosis not present

## 2017-09-20 DIAGNOSIS — M5030 Other cervical disc degeneration, unspecified cervical region: Secondary | ICD-10-CM | POA: Diagnosis not present

## 2017-10-02 ENCOUNTER — Observation Stay
Admission: EM | Admit: 2017-10-02 | Discharge: 2017-10-03 | Disposition: A | Discharge status: Home / Self Care | Payer: Medicare Other | Attending: Internal Medicine | Admitting: Internal Medicine

## 2017-10-02 ENCOUNTER — Emergency Department: Payer: Medicare Other

## 2017-10-02 ENCOUNTER — Other Ambulatory Visit: Payer: Self-pay

## 2017-10-02 DIAGNOSIS — E119 Type 2 diabetes mellitus without complications: Secondary | ICD-10-CM | POA: Diagnosis not present

## 2017-10-02 DIAGNOSIS — I10 Essential (primary) hypertension: Secondary | ICD-10-CM | POA: Diagnosis not present

## 2017-10-02 DIAGNOSIS — F419 Anxiety disorder, unspecified: Secondary | ICD-10-CM | POA: Insufficient documentation

## 2017-10-02 DIAGNOSIS — K219 Gastro-esophageal reflux disease without esophagitis: Secondary | ICD-10-CM | POA: Insufficient documentation

## 2017-10-02 DIAGNOSIS — Z79891 Long term (current) use of opiate analgesic: Secondary | ICD-10-CM | POA: Diagnosis not present

## 2017-10-02 DIAGNOSIS — F329 Major depressive disorder, single episode, unspecified: Secondary | ICD-10-CM | POA: Insufficient documentation

## 2017-10-02 DIAGNOSIS — Z7984 Long term (current) use of oral hypoglycemic drugs: Secondary | ICD-10-CM | POA: Insufficient documentation

## 2017-10-02 DIAGNOSIS — R109 Unspecified abdominal pain: Secondary | ICD-10-CM | POA: Insufficient documentation

## 2017-10-02 DIAGNOSIS — E1143 Type 2 diabetes mellitus with diabetic autonomic (poly)neuropathy: Secondary | ICD-10-CM | POA: Diagnosis not present

## 2017-10-02 DIAGNOSIS — Z6841 Body Mass Index (BMI) 40.0 and over, adult: Secondary | ICD-10-CM | POA: Insufficient documentation

## 2017-10-02 DIAGNOSIS — Z79899 Other long term (current) drug therapy: Secondary | ICD-10-CM | POA: Insufficient documentation

## 2017-10-02 DIAGNOSIS — R197 Diarrhea, unspecified: Secondary | ICD-10-CM

## 2017-10-02 DIAGNOSIS — R112 Nausea with vomiting, unspecified: Secondary | ICD-10-CM

## 2017-10-02 DIAGNOSIS — E876 Hypokalemia: Secondary | ICD-10-CM | POA: Diagnosis not present

## 2017-10-02 DIAGNOSIS — R1084 Generalized abdominal pain: Secondary | ICD-10-CM | POA: Diagnosis not present

## 2017-10-02 DIAGNOSIS — K3184 Gastroparesis: Secondary | ICD-10-CM | POA: Diagnosis not present

## 2017-10-02 DIAGNOSIS — R1110 Vomiting, unspecified: Secondary | ICD-10-CM | POA: Diagnosis not present

## 2017-10-02 DIAGNOSIS — E1140 Type 2 diabetes mellitus with diabetic neuropathy, unspecified: Secondary | ICD-10-CM | POA: Diagnosis not present

## 2017-10-02 DIAGNOSIS — R1030 Lower abdominal pain, unspecified: Secondary | ICD-10-CM | POA: Diagnosis not present

## 2017-10-02 LAB — URINALYSIS, COMPLETE (UACMP) WITH MICROSCOPIC
Bilirubin Urine: NEGATIVE
GLUCOSE, UA: NEGATIVE mg/dL
Hgb urine dipstick: NEGATIVE
KETONES UR: NEGATIVE mg/dL
Leukocytes, UA: NEGATIVE
NITRITE: NEGATIVE
Protein, ur: 30 mg/dL — AB
Specific Gravity, Urine: 1.013 (ref 1.005–1.030)
pH: 8 (ref 5.0–8.0)

## 2017-10-02 LAB — CBC
HEMATOCRIT: 39.4 % (ref 35.0–47.0)
Hemoglobin: 13.2 g/dL (ref 12.0–16.0)
MCH: 29.4 pg (ref 26.0–34.0)
MCHC: 33.6 g/dL (ref 32.0–36.0)
MCV: 87.5 fL (ref 80.0–100.0)
Platelets: 334 10*3/uL (ref 150–440)
RBC: 4.5 MIL/uL (ref 3.80–5.20)
RDW: 14.8 % — AB (ref 11.5–14.5)
WBC: 10.8 10*3/uL (ref 3.6–11.0)

## 2017-10-02 LAB — GLUCOSE, CAPILLARY
GLUCOSE-CAPILLARY: 92 mg/dL (ref 65–99)
GLUCOSE-CAPILLARY: 120 mg/dL — AB (ref 65–99)
Glucose-Capillary: 137 mg/dL — ABNORMAL HIGH (ref 65–99)

## 2017-10-02 LAB — COMPREHENSIVE METABOLIC PANEL
ALBUMIN: 4.3 g/dL (ref 3.5–5.0)
ALK PHOS: 56 U/L (ref 38–126)
ALT: 12 U/L — AB (ref 14–54)
AST: 22 U/L (ref 15–41)
Anion gap: 10 (ref 5–15)
BILIRUBIN TOTAL: 0.6 mg/dL (ref 0.3–1.2)
BUN: 6 mg/dL (ref 6–20)
CO2: 29 mmol/L (ref 22–32)
Calcium: 8.9 mg/dL (ref 8.9–10.3)
Chloride: 99 mmol/L — ABNORMAL LOW (ref 101–111)
Creatinine, Ser: 0.86 mg/dL (ref 0.44–1.00)
GFR calc Af Amer: >60 mL/min (ref >60)
GFR calc non Af Amer: >60 mL/min (ref >60)
GLUCOSE: 166 mg/dL — AB (ref 65–99)
POTASSIUM: 3.3 mmol/L — AB (ref 3.5–5.1)
Sodium: 138 mmol/L (ref 135–145)
TOTAL PROTEIN: 8.3 g/dL — AB (ref 6.5–8.1)

## 2017-10-02 LAB — TROPONIN I

## 2017-10-02 LAB — HEMOGLOBIN A1C
Hgb A1c MFr Bld: 5.9 % — ABNORMAL HIGH (ref 4.8–5.6)
Mean Plasma Glucose: 122.63 mg/dL

## 2017-10-02 LAB — MAGNESIUM: Magnesium: 1.4 mg/dL — ABNORMAL LOW (ref 1.7–2.4)

## 2017-10-02 LAB — LIPASE, BLOOD: Lipase: 21 U/L (ref 11–51)

## 2017-10-02 LAB — LACTIC ACID, PLASMA
LACTIC ACID, VENOUS: 2.6 mmol/L — AB (ref 0.5–1.9)
LACTIC ACID, VENOUS: 2.5 mmol/L — AB (ref 0.5–1.9)

## 2017-10-02 MED ORDER — HYDROMORPHONE HCL 1 MG/ML IJ SOLN
1.0000 mg | Freq: Once | INTRAMUSCULAR | Status: AC
  Administered 2017-10-02: 1 mg via INTRAVENOUS
  Filled 2017-10-02: qty 1

## 2017-10-02 MED ORDER — PREMIER PROTEIN SHAKE
11.0000 [oz_av] | Freq: Three times a day (TID) | ORAL | Status: DC
  Administered 2017-10-02 – 2017-10-03 (×4): 11 [oz_av] via ORAL

## 2017-10-02 MED ORDER — ACETAMINOPHEN 650 MG RE SUPP: 650.0000 mg | Freq: Four times a day (QID) | RECTAL | Status: DC | PRN

## 2017-10-02 MED ORDER — TIZANIDINE HCL 4 MG PO TABS
4.0000 mg | ORAL_TABLET | Freq: Three times a day (TID) | ORAL | Status: DC
  Administered 2017-10-02 – 2017-10-03 (×3): 4 mg via ORAL
  Filled 2017-10-02 (×5): qty 1

## 2017-10-02 MED ORDER — ONDANSETRON HCL 4 MG/2ML IJ SOLN
INTRAMUSCULAR | Status: AC
  Filled 2017-10-02: qty 2

## 2017-10-02 MED ORDER — ACETAMINOPHEN 325 MG PO TABS: 650.0000 mg | ORAL_TABLET | Freq: Four times a day (QID) | ORAL | Status: DC | PRN

## 2017-10-02 MED ORDER — METOCLOPRAMIDE HCL 5 MG/ML IJ SOLN
5.0000 mg | Freq: Once | INTRAMUSCULAR | Status: AC
  Administered 2017-10-02: 5 mg via INTRAVENOUS
  Filled 2017-10-02: qty 2

## 2017-10-02 MED ORDER — TRAZODONE HCL 50 MG PO TABS
100.0000 mg | ORAL_TABLET | Freq: Every day | ORAL | Status: DC
  Administered 2017-10-02: 21:00:00 100 mg via ORAL
  Filled 2017-10-02: qty 2

## 2017-10-02 MED ORDER — ENOXAPARIN SODIUM 40 MG/0.4ML ~~LOC~~ SOLN
40.0000 mg | SUBCUTANEOUS | Status: DC
  Administered 2017-10-02: 13:00:00 40 mg via SUBCUTANEOUS
  Filled 2017-10-02: qty 0.4

## 2017-10-02 MED ORDER — MONTELUKAST SODIUM 10 MG PO TABS
10.0000 mg | ORAL_TABLET | Freq: Every day | ORAL | Status: DC
  Administered 2017-10-02: 21:00:00 10 mg via ORAL
  Filled 2017-10-02: qty 1

## 2017-10-02 MED ORDER — LISINOPRIL 20 MG PO TABS
30.0000 mg | ORAL_TABLET | Freq: Every day | ORAL | Status: DC
  Administered 2017-10-02 – 2017-10-03 (×2): 30 mg via ORAL
  Filled 2017-10-02 (×2): qty 1

## 2017-10-02 MED ORDER — SODIUM CHLORIDE 0.9 % IV BOLUS
1000.0000 mL | Freq: Once | INTRAVENOUS | Status: AC
  Administered 2017-10-02: 1000 mL via INTRAVENOUS

## 2017-10-02 MED ORDER — INSULIN ASPART 100 UNIT/ML ~~LOC~~ SOLN: 0.0000 [IU] | Freq: Every day | SUBCUTANEOUS | Status: DC

## 2017-10-02 MED ORDER — CLONIDINE HCL 0.1 MG/24HR TD PTWK
0.1000 mg | MEDICATED_PATCH | TRANSDERMAL | Status: DC
  Filled 2017-10-02: qty 1

## 2017-10-02 MED ORDER — ONDANSETRON HCL 4 MG/2ML IJ SOLN: 4.0000 mg | Freq: Once | INTRAMUSCULAR | Status: DC | PRN

## 2017-10-02 MED ORDER — ALPRAZOLAM 1 MG PO TABS
2.0000 mg | ORAL_TABLET | Freq: Three times a day (TID) | ORAL | Status: DC | PRN
  Administered 2017-10-02 – 2017-10-03 (×3): 2 mg via ORAL
  Filled 2017-10-02 (×3): qty 2

## 2017-10-02 MED ORDER — LAMOTRIGINE 100 MG PO TABS
100.0000 mg | ORAL_TABLET | Freq: Every day | ORAL | Status: DC
  Administered 2017-10-02 – 2017-10-03 (×2): 100 mg via ORAL
  Filled 2017-10-02 (×2): qty 1

## 2017-10-02 MED ORDER — HYDROMORPHONE HCL 1 MG/ML IJ SOLN
0.5000 mg | INTRAMUSCULAR | Status: DC | PRN
Start: 2017-10-02 — End: 2017-10-03
  Administered 2017-10-02 – 2017-10-03 (×6): 0.5 mg via INTRAVENOUS
  Filled 2017-10-02 (×6): qty 1

## 2017-10-02 MED ORDER — LORATADINE 10 MG PO TABS
10.0000 mg | ORAL_TABLET | Freq: Every day | ORAL | Status: DC
  Administered 2017-10-02 – 2017-10-03 (×2): 10 mg via ORAL
  Filled 2017-10-02 (×2): qty 1

## 2017-10-02 MED ORDER — FLUOXETINE HCL 20 MG PO CAPS
20.0000 mg | ORAL_CAPSULE | Freq: Every day | ORAL | Status: DC
  Administered 2017-10-02 – 2017-10-03 (×2): 20 mg via ORAL
  Filled 2017-10-02 (×2): qty 1

## 2017-10-02 MED ORDER — CLONIDINE HCL 0.1 MG/24HR TD PTWK: 0.1000 mg | MEDICATED_PATCH | TRANSDERMAL | Status: DC

## 2017-10-02 MED ORDER — METOCLOPRAMIDE HCL 5 MG/5ML PO SOLN
10.0000 mg | Freq: Four times a day (QID) | ORAL | Status: DC | PRN
  Filled 2017-10-02: qty 10

## 2017-10-02 MED ORDER — HYDRALAZINE HCL 20 MG/ML IJ SOLN
10.0000 mg | Freq: Four times a day (QID) | INTRAMUSCULAR | Status: DC | PRN
Start: 2017-10-02 — End: 2017-10-03

## 2017-10-02 MED ORDER — ZOLPIDEM TARTRATE 5 MG PO TABS: 5.0000 mg | ORAL_TABLET | Freq: Every evening | ORAL | Status: DC | PRN

## 2017-10-02 MED ORDER — IOPAMIDOL (ISOVUE-300) INJECTION 61%
30.0000 mL | Freq: Once | INTRAVENOUS | Status: AC | PRN
  Administered 2017-10-02: 30 mL via ORAL

## 2017-10-02 MED ORDER — METOPROLOL SUCCINATE ER 50 MG PO TB24
50.0000 mg | ORAL_TABLET | Freq: Every day | ORAL | Status: DC
  Administered 2017-10-02: 13:00:00 50 mg via ORAL
  Filled 2017-10-02 (×2): qty 1

## 2017-10-02 MED ORDER — ONDANSETRON HCL 4 MG PO TABS: 4.0000 mg | ORAL_TABLET | Freq: Four times a day (QID) | ORAL | Status: DC | PRN

## 2017-10-02 MED ORDER — INSULIN ASPART 100 UNIT/ML ~~LOC~~ SOLN
0.0000 [IU] | Freq: Three times a day (TID) | SUBCUTANEOUS | Status: DC
  Administered 2017-10-02: 17:00:00 1 [IU] via SUBCUTANEOUS
  Filled 2017-10-02: qty 1

## 2017-10-02 MED ORDER — PANTOPRAZOLE SODIUM 40 MG PO TBEC
40.0000 mg | DELAYED_RELEASE_TABLET | Freq: Two times a day (BID) | ORAL | Status: DC
  Administered 2017-10-02 – 2017-10-03 (×3): 40 mg via ORAL
  Filled 2017-10-02 (×3): qty 1

## 2017-10-02 MED ORDER — IOPAMIDOL (ISOVUE-300) INJECTION 61%
100.0000 mL | Freq: Once | INTRAVENOUS | Status: AC | PRN
  Administered 2017-10-02: 100 mL via INTRAVENOUS

## 2017-10-02 MED ORDER — POTASSIUM CHLORIDE IN NACL 40-0.9 MEQ/L-% IV SOLN
INTRAVENOUS | Status: DC
Start: 2017-10-02 — End: 2017-10-03
  Administered 2017-10-02 – 2017-10-03 (×2): 100 mL/h via INTRAVENOUS
  Filled 2017-10-02 (×4): qty 1000

## 2017-10-02 MED ORDER — PROMETHAZINE HCL 25 MG PO TABS
12.5000 mg | ORAL_TABLET | Freq: Four times a day (QID) | ORAL | Status: DC | PRN
  Filled 2017-10-02: qty 1

## 2017-10-02 MED ORDER — ONDANSETRON HCL 4 MG/2ML IJ SOLN
4.0000 mg | Freq: Four times a day (QID) | INTRAMUSCULAR | Status: DC | PRN
  Administered 2017-10-02: 4 mg via INTRAVENOUS
  Filled 2017-10-02: qty 2

## 2017-10-02 MED ORDER — SENNOSIDES-DOCUSATE SODIUM 8.6-50 MG PO TABS: 1.0000 | ORAL_TABLET | Freq: Every evening | ORAL | Status: DC | PRN

## 2017-10-02 MED ORDER — ALBUTEROL SULFATE (2.5 MG/3ML) 0.083% IN NEBU: 2.5000 mg | INHALATION_SOLUTION | RESPIRATORY_TRACT | Status: DC | PRN

## 2017-10-02 MED ORDER — AMLODIPINE BESYLATE 5 MG PO TABS
5.0000 mg | ORAL_TABLET | Freq: Every day | ORAL | Status: DC
  Administered 2017-10-02: 5 mg via ORAL
  Filled 2017-10-02 (×2): qty 1

## 2017-10-02 NOTE — ED Notes (Signed)
Pt unhooked at pt request to use the restroom. Pt able to use the restroom without any assistance

## 2017-10-02 NOTE — H&P (Signed)
[facility] - [place] at [facility]   PATIENT NAME: Jasmain Ahlberg    MR#:  [id number]  DATE OF BIRTH:  [birth year]  DATE OF ADMISSION:  10/02/2017  PRIMARY CARE PHYSICIAN: Malva Limes, MD   REQUESTING/REFERRING PHYSICIAN: Dionne Bucy, MD  CHIEF COMPLAINT:   Chief Complaint  Patient presents with  . Abdominal Pain  . Emesis   Epigastric pain and emesis for couple days. HISTORY OF PRESENT ILLNESS:  Nalaysia Manganiello  is a 52 y.o. female with a known history of hypertension, diabetes, gastroparesis, gastritis, migraine and the hernia of abdominal wall.  The patient presents the ED with above chief complaints.  She states that abdominal pain is 8 out of 10, sharp, intermittent without radiation.  She also has some nausea, vomiting and loose stool.  She denies any other symptoms.CT shows gastric thickening but no acute findings.  PAST MEDICAL HISTORY:   Past Medical History:  Diagnosis Date  . Diabetes mellitus without complication (HCC)   . Gastritis   . Gastroparesis   . Hernia of abdominal wall   . Hypertension   . Migraine     PAST SURGICAL HISTORY:   Past Surgical History:  Procedure Laterality Date  . ABDOMINAL HYSTERECTOMY    . APPENDECTOMY    . APPLICATION OF WOUND VAC    . CHOLECYSTECTOMY    . HERNIA REPAIR    . KNEE SURGERY      SOCIAL HISTORY:   Social History   Tobacco Use  . Smoking status: Never Smoker  . Smokeless tobacco: Former Neurosurgeon    Types: Snuff  Substance Use Topics  . Alcohol use: Yes    Alcohol/week: 0.0 oz    Comment: only on special occasions- wine    FAMILY HISTORY:   Family History  Problem Relation Age of Onset  . Arthritis Mother   . Depression Mother   . Diabetes Mother   . Heart disease Mother   . Hyperlipidemia Mother   . Hypertension Mother   . Stroke Mother   . Arthritis Father   . Diabetes Father   . Hearing loss Father   . Heart disease Father   . Hyperlipidemia Father   .  Hypertension Father     DRUG ALLERGIES:   Allergies  Allergen Reactions  . Acetaminophen-Codeine Hives  . Atorvastatin Other (See Comments)    Muscle pain  . Codeine Hives  . Ibuprofen Nausea And Vomiting  . Morphine Nausea And Vomiting  . Propoxyphene Other (See Comments)    Reaction:  GI upset   . Sulfa Antibiotics Hives  . Duloxetine Rash and Other (See Comments)    Yellowing of the skin    REVIEW OF SYSTEMS:   Review of Systems  Constitutional: Negative for chills, fever and malaise/fatigue.  HENT: Negative for sore throat.   Eyes: Negative for blurred vision and double vision.  Respiratory: Negative for cough, hemoptysis, shortness of breath, wheezing and stridor.   Cardiovascular: Negative for chest pain, palpitations, orthopnea and leg swelling.  Gastrointestinal: Positive for abdominal pain, nausea and vomiting. Negative for blood in stool, diarrhea and melena.  Genitourinary: Negative for dysuria, flank pain and hematuria.  Musculoskeletal: Negative for back pain and joint pain.  Skin: Negative for rash.  Neurological: Negative for dizziness, sensory change, focal weakness, seizures, loss of consciousness, weakness and headaches.  Endo/Heme/Allergies: Negative for polydipsia.  Psychiatric/Behavioral: Negative for depression. The patient is not nervous/anxious.     MEDICATIONS AT HOME:   Prior  to Admission medications   Medication Sig Start Date End Date Taking? Authorizing Provider  albuterol (PROVENTIL HFA;VENTOLIN HFA) 108 (90 Base) MCG/ACT inhaler Inhale 2 puffs into the lungs every 6 (six) hours as needed for wheezing or shortness of breath.   Yes [provider]  alprazolam Prudy Feeler) 2 MG tablet Take 2 mg by mouth 3 (three) times daily as needed for anxiety.    Yes [provider]  amLODipine (NORVASC) 5 MG tablet TAKE 1 TABLET (5 MG TOTAL) BY MOUTH DAILY. 07/13/17  Yes Malva Limes, MD  cholecalciferol (VITAMIN D) 1000 units tablet Take  1,000 Units by mouth daily.   Yes [provider]  cloNIDine (CATAPRES - DOSED IN MG/24 HR) 0.1 mg/24hr patch PLACE 1 PATCH (0.1 MG TOTAL) ONTO THE SKIN ONCE A WEEK. 11/30/16  Yes Malva Limes, MD  FLUoxetine (PROZAC) 20 MG capsule Take 1 capsule by mouth daily.   Yes [provider]  furosemide (LASIX) 20 MG tablet Take 1 tablet (20 mg total) by mouth daily as needed for edema. 07/11/17  Yes Malva Limes, MD  lamoTRIgine (LAMICTAL) 100 MG tablet Take 1 tablet by mouth daily.   Yes [provider]  lisinopril (PRINIVIL,ZESTRIL) 20 MG tablet TAKE 1 AND 1/2 TABLETS (30 MG TOTAL) BY MOUTH DAILY. 09/01/17  Yes Malva Limes, MD  loratadine (CLARITIN) 10 MG tablet Take 1 tablet (10 mg total) by mouth daily. 03/18/17  Yes Enedina Finner, MD  metFORMIN (GLUCOPHAGE) 500 MG tablet Take 1 tablet (500 mg total) by mouth 2 (two) times daily with a meal. 04/15/17  Yes Malva Limes, MD  metoprolol succinate (TOPROL-XL) 25 MG 24 hr tablet TAKE 2 TABLETS (50 MG TOTAL) BY MOUTH DAILY. 09/05/17  Yes Malva Limes, MD  montelukast (SINGULAIR) 10 MG tablet Take 1 tablet (10 mg total) by mouth at bedtime. 04/15/17  Yes Malva Limes, MD  Oxycodone HCl 10 MG TABS Take 10 mg by mouth as directed. 7 tablets daily if tolerated per Dr. Metta Clines 03/08/17  Yes [provider]  pantoprazole (PROTONIX) 40 MG tablet Take 1 tablet (40 mg total) by mouth 2 (two) times daily. 11/10/16  Yes Malva Limes, MD  promethazine (PHENERGAN) 12.5 MG tablet Take 1 tablet (12.5 mg total) by mouth every 6 (six) hours as needed for refractory nausea / vomiting. 03/04/17  Yes Malva Limes, MD  ranitidine (ZANTAC) 150 MG tablet TAKE 1 TABLET (150 MG TOTAL) BY MOUTH AT BEDTIME. 07/08/17  Yes Malva Limes, MD  tiZANidine (ZANAFLEX) 4 MG tablet Take 4 mg by mouth 3 (three) times daily. 1-3 tablets per day as tolerated per Dr. Metta Clines   Yes [provider]  traZODone (DESYREL) 100 MG tablet Take  100 mg by mouth at bedtime. 10/12/16  Yes [provider]  vitamin B-12 (CYANOCOBALAMIN) 1000 MCG tablet Take 1,000 mcg by mouth daily.   Yes [provider]  zolpidem (AMBIEN) 10 MG tablet Take 1 tablet by mouth at bedtime as needed. 03/23/17  Yes [provider]  cloNIDine (CATAPRES-TTS-1) 0.1 mg/24hr patch Place 1 patch (0.1 mg total) onto the skin once a week. Patient not taking: Reported on 10/02/2017 06/03/17   Malva Limes, MD  HYDROcodone-homatropine [facility]) 5-1.5 MG/5ML syrup Take 5 mLs by mouth every 8 (eight) hours as needed for cough. Patient not taking: Reported on 10/02/2017 06/17/17   Malva Limes, MD  protein supplement shake (PREMIER PROTEIN) LIQD Take 325 mLs (11  oz total) by mouth 3 (three) times daily between meals. 03/18/17   Enedina Finner, MD  rosuvastatin (CRESTOR) 10 MG tablet Take 1 tablet (10 mg total) by mouth daily. Patient not taking: Reported on 08/15/2017 06/17/17   Malva Limes, MD      VITAL SIGNS:  Blood pressure (!) 152/96, pulse (!) 104, temperature 99.9 F (37.7 C), temperature source Oral, resp. rate 15, weight 285 lb (129.3 kg), SpO2 97 %.  PHYSICAL EXAMINATION:  Physical Exam  GENERAL:  52 y.o.-year-old patient lying in the bed with no acute distress.  Morbid obesity. EYES: Pupils equal, round, reactive to light and accommodation. No scleral icterus. Extraocular muscles intact.  HEENT: Head atraumatic, normocephalic. Oropharynx and nasopharynx clear.  NECK:  Supple, no jugular venous distention. No thyroid enlargement, no tenderness.  LUNGS: Normal breath sounds bilaterally, no wheezing, rales,rhonchi or crepitation. No use of accessory muscles of respiration.  CARDIOVASCULAR: S1, S2 normal. No murmurs, rubs, or gallops.  ABDOMEN: Soft, tenderness in upper and epigastric area, nondistended. Bowel sounds present. No organomegaly or mass.  EXTREMITIES: No pedal edema, cyanosis, or clubbing.  NEUROLOGIC: Cranial nerves II  through XII are intact. Muscle strength 5/5 in all extremities. Sensation intact. Gait not checked.  PSYCHIATRIC: The patient is alert and oriented x 3.  SKIN: No obvious rash, lesion, or ulcer.   LABORATORY PANEL:   CBC Recent Labs  Lab 10/02/17 0549  WBC 10.8  HGB 13.2  HCT 39.4  PLT 334   ------------------------------------------------------------------------------------------------------------------  Chemistries  Recent Labs  Lab 10/02/17 0549  NA 138  K 3.3*  CL 99*  CO2 29  GLUCOSE 166*  BUN 6  CREATININE 0.86  CALCIUM 8.9  AST 22  ALT 12*  ALKPHOS 56  BILITOT 0.6   ------------------------------------------------------------------------------------------------------------------  Cardiac Enzymes Recent Labs  Lab 10/02/17 0549  TROPONINI <0.03   ------------------------------------------------------------------------------------------------------------------  RADIOLOGY:  Ct Abdomen Pelvis W Contrast  Result Date: 10/02/2017 CLINICAL DATA:  Abdominal pain. Nausea vomiting and diarrhea. Gastroparesis and gastritis. Known abdominal wall hernia. EXAM: CT ABDOMEN AND PELVIS WITH CONTRAST TECHNIQUE: Multidetector CT imaging of the abdomen and pelvis was performed using the standard protocol following bolus administration of intravenous contrast. CONTRAST:  ISOVUE-300 IOPAMIDOL (ISOVUE-300) INJECTION 61% COMPARISON:  Plain film 07/19/2017.  Most recent CT 03/16/2017. FINDINGS: Lower chest: Clear lung bases. Borderline cardiomegaly, without pericardial or pleural effusion. Hepatobiliary: Mild hepatomegaly at 18.5 cm craniocaudal. No focal liver lesion. Cholecystectomy, without biliary ductal dilatation. Pancreas: Normal, without mass or ductal dilatation. Spleen: Normal in size, without focal abnormality. Adrenals/Urinary Tract: Normal adrenal glands. Normal kidneys, without hydronephrosis. Normal urinary bladder. Stomach/Bowel: The gastric antrum is underdistended.  Apparent mild wall thickening is felt to be secondary, including image 30/2. Appearance is similar to 03/16/2017. Colonic stool burden suggests constipation. Normal terminal ileum. Appendix surgically absent by report. Again identified is a right lower abdominal wall ventral hernia containing nonobstructive small bowel. No findings to suggest strangulation. Vascular/Lymphatic: Normal caliber of the aorta and branch vessels. No abdominopelvic adenopathy. Reproductive: Hysterectomy.  No adnexal mass. Other: No significant free fluid.  Mild pelvic floor laxity. Musculoskeletal: No acute osseous abnormality. IMPRESSION: 1.  No acute process in the abdomen or pelvis. 2. Small bowel containing right-sided ventral abdominal wall hernia, similar. No obstruction or strangulation identified. 3. Apparent gastric antral wall thickening is favored to be due to underdistention, and is similar to on the prior. Electronically Signed   By: Jeronimo Greaves M.D.   On: 10/02/2017 09:34  IMPRESSION AND PLAN:   Gastroparesis. The patient will be placed for observation.  Clear liquid diet, IV fluid support, pain control and Reglan as needed.  Hypertension, accelerated.  Continue home hypertension medication, IV hydralazine as needed.  Hypokalemia.  Give potassium supplement and follow-up level. Diabetes.  Hold metformin and start sliding scale.  Morbid obesity.  All the records are reviewed and case discussed with ED provider. Management plans discussed with the patient, family and they are in agreement.  CODE STATUS: Full code.  TOTAL TIME TAKING CARE OF THIS PATIENT: 46 minutes.    Shaune PollackQing Mashawn Brazil M.D on 10/02/2017 at 10:54 AM  Between 7am to 6pm - Pager - [phone]  After 6pm go to [email or web address] - Social research officer, governmentpassword [email or web address]  [facility]  Hospitalists  Office  [phone]  CC: Primary care physician; Malva LimesFisher, Donald E, MD   Note: This dictation was prepared with Dragon dictation along with smaller  phrase technology. Any transcriptional errors that result from this process are unin

## 2017-10-02 NOTE — ED Provider Notes (Signed)
-----------------------------------------   9:49 AM on 10/02/2017 -----------------------------------------  I took over care of this patient from Dr. Dolores [name].  Patient has a history of gastroparesis and prior visits and admissions for vomiting and abdominal pain.  Today's presentation was initially similar.  The patient had some abdominal tenderness, so Dr. Dora [name] had obtained a CT.  Result was pending at the time of signout.  CT shows gastric thickening but no acute findings.  There is a hernia but no evidence of obstruction.  On reassessment, the patient reports that the symptoms have improved although she still is nauseous and has some pain.  She states she does not feel well to go home.  Lab work-up is remarkable primarily for elevated lactate, which is most consistent with acute vomiting.  We will obtain a repeat after fluids.  Although patient has borderline elevated temp, her UA is not consistent with UTI, her CT does not demonstrate infectious source, and she has no respiratory symptoms.  There is no clinical evidence of sepsis.  Plan for admission for IV hydration and symptom control.  ----------------------------------------- 10:06 AM on 10/02/2017 -----------------------------------------  I signed the patient out to the hospitalist Dr. Imogene [name].   Daisy [name], Daisy Tates, MD 10/02/17 1006

## 2017-10-02 NOTE — ED Provider Notes (Signed)
[facility] Emergency Department Provider Note   ____________________________________________   First MD Initiated Contact with Patient 10/02/17 [phone]     (approximate)  I have reviewed the triage vital signs and the nursing notes.   HISTORY  Chief Complaint Abdominal Pain and Emesis    HPI Daisy [name] is a 52 y.o. female brought to the ED from home via EMS with a chief complaint of generalized abdominal pain, nausea/vomiting/diarrhea.  Patient has a history of gastroparesis as well as gastritis with prior hospitalizations for same.  Onset of generalized abdominal pain yesterday associated nausea and vomiting.  2 loose stools today.  Also has a history of abdominal wall hernia and is concerned her hernia is causing problems.  Denies fever, chills, chest pain, shortness of breath, dysuria.  Denies recent travel or trauma.  Arrives to the ED actively vomiting.  States Dilaudid and Reglan usually worked the best for her.   Past Medical History:  Diagnosis Date  . Diabetes mellitus without complication ([place])   . Gastritis   . Gastroparesis   . Hernia of abdominal wall   . Hypertension   . Migraine     Patient Active Problem List   Diagnosis Date Noted  . Allergic rhinitis 04/18/2017  . Gastroparesis 01/03/2017  . Hyperlipidemia 06/18/2016  . Controlled type 2 diabetes mellitus without complication, without long-term current use of insulin ([place]) 06/18/2016  . Bipolar 2 disorder ([place]) 06/18/2016  . Depression 06/18/2016  . GERD (gastroesophageal reflux disease) 06/18/2016  . Hypertension 06/18/2016  . Ingrown toenail 06/18/2016  . Hypokalemia 09/28/2015  . Chronic abdominal pain 05/23/2015  . Complex ovarian cyst 05/23/2015  . Anxiety 03/27/2015  . Insomnia 03/27/2015  . Gastritis 01/07/2015  . DDD (degenerative disc disease), lumbar 11/28/2014  . DJD (degenerative joint disease) of knee 11/28/2014  . Migraine headache 11/28/2014  . Bilateral  occipital neuralgia 11/28/2014  . Morbid obesity ([place]) 11/28/2014  . Neuropathy due to secondary diabetes ([place]) 11/28/2014  . Abdominal wall abscess 09/20/2014  . Back pain, chronic 09/20/2014  . Incisional hernia, without obstruction or gangrene 04/30/2014  . Bilateral chronic knee pain 03/04/2014    Past Surgical History:  Procedure Laterality Date  . ABDOMINAL HYSTERECTOMY    . APPENDECTOMY    . APPLICATION OF WOUND VAC    . CHOLECYSTECTOMY    . HERNIA REPAIR    . KNEE SURGERY      Prior to Admission medications   Medication Sig Start Date End Date Taking? Authorizing Provider  albuterol (PROVENTIL HFA;VENTOLIN HFA) 108 (90 Base) MCG/ACT inhaler Inhale 2 puffs into the lungs every 6 (six) hours as needed for wheezing or shortness of breath.   Yes [provider]  alprazolam Duanne Moron) 2 MG tablet Take 2 mg by mouth 3 (three) times daily as needed for anxiety.    Yes [provider]  amLODipine (NORVASC) 5 MG tablet TAKE 1 TABLET (5 MG TOTAL) BY MOUTH DAILY. 07/13/17  Yes Birdie Sons, MD  cholecalciferol (VITAMIN D) 1000 units tablet Take 1,000 Units by mouth daily.   Yes [provider]  cloNIDine (CATAPRES - DOSED IN MG/24 HR) 0.1 mg/24hr patch PLACE 1 PATCH (0.1 MG TOTAL) ONTO THE SKIN ONCE A WEEK. 11/30/16  Yes Birdie Sons, MD  FLUoxetine (PROZAC) 20 MG capsule Take 1 capsule by mouth daily.   Yes [provider]  furosemide (LASIX) 20 MG tablet Take 1 tablet (20 mg total) by mouth daily as needed for  edema. 07/11/17  Yes Birdie Sons, MD  lamoTRIgine (LAMICTAL) 100 MG tablet Take 1 tablet by mouth daily.   Yes [provider]  lisinopril (PRINIVIL,ZESTRIL) 20 MG tablet TAKE 1 AND 1/2 TABLETS (30 MG TOTAL) BY MOUTH DAILY. 09/01/17  Yes Birdie Sons, MD  loratadine (CLARITIN) 10 MG tablet Take 1 tablet (10 mg total) by mouth daily. 03/18/17  Yes Fritzi Mandes, MD  metFORMIN (GLUCOPHAGE) 500 MG tablet Take 1 tablet (500 mg total)  by mouth 2 (two) times daily with a meal. 04/15/17  Yes Birdie Sons, MD  metoprolol succinate (TOPROL-XL) 25 MG 24 hr tablet TAKE 2 TABLETS (50 MG TOTAL) BY MOUTH DAILY. 09/05/17  Yes Birdie Sons, MD  montelukast (SINGULAIR) 10 MG tablet Take 1 tablet (10 mg total) by mouth at bedtime. 04/15/17  Yes Birdie Sons, MD  Oxycodone HCl 10 MG TABS Take 10 mg by mouth as directed. 7 tablets daily if tolerated per Dr. Primus Bravo 03/08/17  Yes [provider]  pantoprazole (PROTONIX) 40 MG tablet Take 1 tablet (40 mg total) by mouth 2 (two) times daily. 11/10/16  Yes Birdie Sons, MD  promethazine (PHENERGAN) 12.5 MG tablet Take 1 tablet (12.5 mg total) by mouth every 6 (six) hours as needed for refractory nausea / vomiting. 03/04/17  Yes Birdie Sons, MD  ranitidine (ZANTAC) 150 MG tablet TAKE 1 TABLET (150 MG TOTAL) BY MOUTH AT BEDTIME. 07/08/17  Yes Birdie Sons, MD  tiZANidine (ZANAFLEX) 4 MG tablet Take 4 mg by mouth 3 (three) times daily. 1-3 tablets per day as tolerated per Dr. Primus Bravo   Yes [provider]  traZODone (DESYREL) 100 MG tablet Take 100 mg by mouth at bedtime. 10/12/16  Yes [provider]  vitamin B-12 (CYANOCOBALAMIN) 1000 MCG tablet Take 1,000 mcg by mouth daily.   Yes [provider]  zolpidem (AMBIEN) 10 MG tablet Take 1 tablet by mouth at bedtime as needed. 03/23/17  Yes [provider]  cloNIDine (CATAPRES-TTS-1) 0.1 mg/24hr patch Place 1 patch (0.1 mg total) onto the skin once a week. Patient not taking: Reported on 10/02/2017 06/03/17   Birdie Sons, MD  HYDROcodone-homatropine [facility]) 5-1.5 MG/5ML syrup Take 5 mLs by mouth every 8 (eight) hours as needed for cough. Patient not taking: Reported on 10/02/2017 06/17/17   Birdie Sons, MD  protein supplement shake (PREMIER PROTEIN) LIQD Take 325 mLs (11 oz total) by mouth 3 (three) times daily between meals. 03/18/17   Fritzi Mandes, MD  rosuvastatin (CRESTOR) 10 MG tablet  Take 1 tablet (10 mg total) by mouth daily. Patient not taking: Reported on 08/15/2017 06/17/17   Birdie Sons, MD    Allergies Acetaminophen-codeine; Atorvastatin; Codeine; Ibuprofen; Morphine; Propoxyphene; Sulfa antibiotics; and Duloxetine  Family History  Problem Relation Age of Onset  . Arthritis Mother   . Depression Mother   . Diabetes Mother   . Heart disease Mother   . Hyperlipidemia Mother   . Hypertension Mother   . Stroke Mother   . Arthritis Father   . Diabetes Father   . Hearing loss Father   . Heart disease Father   . Hyperlipidemia Father   . Hypertension Father     Social History Social History   Tobacco Use  . Smoking status: Never Smoker  . Smokeless tobacco: Former Systems developer    Types: Snuff  Substance Use Topics  . Alcohol use: Yes    Alcohol/week: 0.0 oz  Comment: only on special occasions- wine  . Drug use: No    Review of Systems  Constitutional: No fever/chills. Eyes: No visual changes. ENT: No sore throat. Cardiovascular: Denies chest pain. Respiratory: Denies shortness of breath. Gastrointestinal: Positive for abdominal pain, nausea, vomiting and diarrhea.  No constipation. Genitourinary: Negative for dysuria. Musculoskeletal: Negative for back pain. Skin: Negative for rash. Neurological: Negative for headaches, focal weakness or numbness.   ____________________________________________   PHYSICAL EXAM:  VITAL SIGNS: ED Triage Vitals  Enc Vitals Group     BP 10/02/17 0531 (!) 178/112     Pulse Rate 10/02/17 0531 98     Resp 10/02/17 0531 (!) 22     Temp 10/02/17 0531 99.3 F (37.4 C)     Temp Source 10/02/17 0531 Oral     SpO2 10/02/17 0531 100 %     Weight 10/02/17 0529 285 lb (129.3 kg)     Height --      Head Circumference --      Peak Flow --      Pain Score 10/02/17 0528 9     Pain Loc --      Pain Edu? --      Excl. in [place]? --     Constitutional: Alert and oriented.  Uncomfortable appearing and in mild acute  distress. Eyes: Conjunctivae are normal. PERRL. EOMI. Head: Atraumatic. Nose: No congestion/rhinnorhea. Mouth/Throat: Mucous membranes are moist.  Oropharynx non-erythematous. Neck: No stridor.  Supple neck without meningismus. Cardiovascular: Normal rate, regular rhythm. Grossly normal heart sounds.  Good peripheral circulation. Respiratory: Normal respiratory effort.  No retractions. Lungs CTAB. Gastrointestinal: Obese.  Soft and diffusely tender to palpation without rebound or guarding. No distention. No abdominal bruits. No CVA tenderness. Musculoskeletal: No lower extremity tenderness nor edema.  No joint effusions. Neurologic:  Normal speech and language. No gross focal neurologic deficits are appreciated.  Skin:  Skin is hot, flushed, and intact. No rash noted. Psychiatric: Mood and affect are normal. Speech and behavior are normal.  ____________________________________________   LABS (all labs ordered are listed, but only abnormal results are displayed)  Labs Reviewed  COMPREHENSIVE METABOLIC PANEL - Abnormal; Notable for the following components:      Result Value   Potassium 3.3 (*)    Chloride 99 (*)    Glucose, Bld 166 (*)    Total Protein 8.3 (*)    ALT 12 (*)    All other components within normal limits  CBC - Abnormal; Notable for the following components:   RDW 14.8 (*)    All other components within normal limits  LIPASE, BLOOD  TROPONIN I  URINALYSIS, COMPLETE (UACMP) WITH MICROSCOPIC  LACTIC ACID, PLASMA  LACTIC ACID, PLASMA   ____________________________________________  EKG  ED ECG REPORT I, Lulubelle Simcoe J, the attending physician, personally viewed and interpreted this ECG.   Date: 10/02/2017  EKG Time: 0537  Rate: 94  Rhythm: normal EKG, normal sinus rhythm  Axis: Normal  Intervals:none  ST&T Change: Nonspecific  ____________________________________________  RADIOLOGY  ED MD interpretation: CT pending  Official radiology report(s): No  results found.  ____________________________________________   PROCEDURES  Procedure(s) performed: None  Procedures  Critical Care performed: No  ____________________________________________   INITIAL IMPRESSION / ASSESSMENT AND PLAN / ED COURSE  As part of my medical decision making, I reviewed the following data within the [place] notes reviewed and incorporated, Labs reviewed, EKG interpreted, Old chart reviewed and Notes from prior ED visits   52 year old  female with diabetes, history of gastroparesis and gastritis who presents with diffuse abdominal pain, nausea/vomiting > diarrhea. Differential diagnosis includes, but is not limited to, ovarian cyst, ovarian torsion, acute appendicitis, diverticulitis, urinary tract infection/pyelonephritis, endometriosis, bowel obstruction, colitis, renal colic, gastroenteritis, hernia, etc.   Patient reports Dilaudid and Reglan have worked in the past for her.  Will initiate IV fluid resuscitation, IV Dilaudid, IV Reglan.  Last CT scan was October 2018 when she was hospitalized for similar symptoms.  Last ED visit 07/19/2017 where patient was treated with various antiemetics including Haldol and was able to be discharged home.  CBC and met be so far unremarkable.  Awaiting urinalysis.  Will add lactate to assess strangulation as patient has known abdominal wall hernia.  Proceed with CT abdomen/pelvis to evaluate for intra- abdominal etiology of patient's pain.  Clinical Course as of Oct 02 713  Sun Oct 02, 2017  0714 Care transferred to Dr. Cherylann Banas pending results of lactate, urinalysis, CT scan, reassessment and disposition.   [JS]    Clinical Course User Index [JS] Paulette Blanch, MD     ____________________________________________   FINAL CLINICAL IMPRESSION(S) / ED DIAGNOSES  Final diagnoses:  Generalized abdominal pain  Nausea vomiting and diarrhea  Gastroparesis  Essential hypertension     ED  Discharge Orders    None       Note:  This document was prepared using Dragon voice recognition software and may include unintentional dictation errors.    Paulette Blanch, MD 10/02/17 [phone]

## 2017-10-02 NOTE — ED Triage Notes (Signed)
Patient coming from home. Patient c/o generalized abdominal pain, N/V/D beginning at 1900 yesterday. Patient has history of gastritis and gastroparesis. Patient actively vomiting bright green emesis during triage.

## 2017-10-03 DIAGNOSIS — K3184 Gastroparesis: Secondary | ICD-10-CM | POA: Diagnosis not present

## 2017-10-03 DIAGNOSIS — E876 Hypokalemia: Secondary | ICD-10-CM | POA: Diagnosis not present

## 2017-10-03 DIAGNOSIS — E1143 Type 2 diabetes mellitus with diabetic autonomic (poly)neuropathy: Secondary | ICD-10-CM | POA: Diagnosis not present

## 2017-10-03 DIAGNOSIS — E119 Type 2 diabetes mellitus without complications: Secondary | ICD-10-CM | POA: Diagnosis not present

## 2017-10-03 DIAGNOSIS — I10 Essential (primary) hypertension: Secondary | ICD-10-CM | POA: Diagnosis not present

## 2017-10-03 LAB — BASIC METABOLIC PANEL
ANION GAP: 6 (ref 5–15)
BUN: 9 mg/dL (ref 6–20)
CALCIUM: 8.2 mg/dL — AB (ref 8.9–10.3)
CHLORIDE: 100 mmol/L — AB (ref 101–111)
CO2: 29 mmol/L (ref 22–32)
CREATININE: 0.67 mg/dL (ref 0.44–1.00)
GFR calc non Af Amer: >60 mL/min (ref >60)
Glucose, Bld: 114 mg/dL — ABNORMAL HIGH (ref 65–99)
Potassium: 4.1 mmol/L (ref 3.5–5.1)
Sodium: 135 mmol/L (ref 135–145)

## 2017-10-03 LAB — GLUCOSE, CAPILLARY
GLUCOSE-CAPILLARY: 97 mg/dL (ref 65–99)
Glucose-Capillary: 93 mg/dL (ref 65–99)

## 2017-10-03 MED ORDER — OXYCODONE-ACETAMINOPHEN 5-325 MG PO TABS
1.0000 | ORAL_TABLET | Freq: Four times a day (QID) | ORAL | Status: DC | PRN
  Administered 2017-10-03: 13:00:00 1 via ORAL
  Filled 2017-10-03: qty 1

## 2017-10-03 MED ORDER — OXYCODONE-ACETAMINOPHEN 5-325 MG PO TABS: 1.0000 | ORAL_TABLET | Freq: Four times a day (QID) | ORAL | 0 refills | Status: AC | PRN

## 2017-10-03 MED ORDER — ENOXAPARIN SODIUM 40 MG/0.4ML ~~LOC~~ SOLN
40.0000 mg | Freq: Two times a day (BID) | SUBCUTANEOUS | Status: DC
  Administered 2017-10-03: 40 mg via SUBCUTANEOUS
  Filled 2017-10-03: qty 0.4

## 2017-10-03 NOTE — Care Management Obs Status (Signed)
MEDICARE OBSERVATION STATUS NOTIFICATION   Patient Details  Name: Daisy GoslingDanita A [name] MRN: [id number] Date of Birth: 08-Jun-1966   Medicare Observation Status Notification Given:  Yes    Gwenette GreetBrenda S Lethia Donlon, RN 10/03/2017, 8:31 AM

## 2017-10-03 NOTE — Plan of Care (Signed)
  Problem: Spiritual Needs Goal: Ability to function at adequate level Outcome: Progressing   Problem: Education: Goal: Knowledge of General Education information will improve Outcome: Progressing   Problem: Health Behavior/Discharge Planning: Goal: Ability to manage health-related needs will improve Outcome: Progressing   Problem: Clinical Measurements: Goal: Ability to maintain clinical measurements within normal limits will improve Outcome: Progressing

## 2017-10-03 NOTE — Progress Notes (Signed)
   10/03/17 1030  Clinical Encounter Type  Visited With Patient  Visit Type Initial  Referral From Nurse  Consult/Referral To Chaplain  Spiritual Encounters  Spiritual Needs Other (Comment)   CH received an OR for an AD. CH reported to RM and educated PT on the AD process. CH will follow up as needed.

## 2017-10-03 NOTE — Progress Notes (Signed)
Discharge instructions and medication details reviewed with patient. Printed prescription for percocet and AVS given to patient. Patient verbalizes understanding. All questions answered. IV removed.  Patient escorted out via wheelchair

## 2017-10-03 NOTE — Discharge Summary (Signed)
[employer] - [place] at [facility]   PATIENT NAME: Daisy [name]    MR#:  [id number]  DATE OF BIRTH:  May 12, 1966  DATE OF ADMISSION:  10/02/2017 ADMITTING PHYSICIAN: Shaune Pollack, MD  DATE OF DISCHARGE: 10/03/2017  PRIMARY CARE PHYSICIAN: Malva Limes, MD   ADMISSION DIAGNOSIS:  Gastroparesis [K31.84] Generalized abdominal pain [R10.84] Nausea vomiting and diarrhea [R11.2, R19.7] Essential hypertension [I10]  DISCHARGE DIAGNOSIS:  Gastroparesis improved Nausea and vomiting resolved  abdominal pain improved Hypertension   SECONDARY DIAGNOSIS:   Past Medical History:  Diagnosis Date  . Diabetes mellitus without complication (HCC)   . Gastritis   . Gastroparesis   . Hernia of abdominal wall   . Hypertension   . Migraine      ADMITTING HISTORY Daisy [name]  is a 52 y.o. female with a known history of hypertension, diabetes, gastroparesis, gastritis, migraine and the hernia of abdominal wall.  The patient presents the ED with above chief complaints.  She states that abdominal pain is 8 out of 10, sharp, intermittent without radiation.  She also has some nausea, vomiting and loose stool.  She denies any other symptoms.CT shows gastric thickening but no acute findings.   HOSPITAL COURSE:  Patient was admitted to medical floor.  She received IV fluids.  Antiemetic medication was given for nausea and vomiting.  IV pain medication along with oral pain medication was given for abdominal pain abdominal pain improved and patient was started on diet and she diet was advanced.  Her nausea and vomiting resolved and she tolerated diet well.  Was worked up with CT abdomen which showed a ventral abdominal hernia.  No obstruction or strangulation noted.  CONSULTS OBTAINED:  None  DRUG ALLERGIES:   Allergies  Allergen Reactions  . Acetaminophen-Codeine Hives  . Atorvastatin Other (See Comments)    Muscle pain  . Codeine Hives  . Ibuprofen Nausea And Vomiting  .  Morphine Nausea And Vomiting  . Propoxyphene Other (See Comments)    Reaction:  GI upset   . Sulfa Antibiotics Hives  . Duloxetine Rash and Other (See Comments)    Yellowing of the skin    DISCHARGE MEDICATIONS:   Allergies as of 10/03/2017      Reactions   Acetaminophen-codeine Hives   Atorvastatin Other (See Comments)   Muscle pain   Codeine Hives   Ibuprofen Nausea And Vomiting   Morphine Nausea And Vomiting   Propoxyphene Other (See Comments)   Reaction:  GI upset    Sulfa Antibiotics Hives   Duloxetine Rash, Other (See Comments)   Yellowing of the skin      Medication List    STOP taking these medications   Oxycodone HCl 10 MG Tabs     TAKE these medications   albuterol 108 (90 Base) MCG/ACT inhaler Commonly known as:  PROVENTIL HFA;VENTOLIN HFA Inhale 2 puffs into the lungs every 6 (six) hours as needed for wheezing or shortness of breath.   alprazolam 2 MG tablet Commonly known as:  XANAX Take 2 mg by mouth 3 (three) times daily as needed for anxiety.   amLODipine 5 MG tablet Commonly known as:  NORVASC TAKE 1 TABLET (5 MG TOTAL) BY MOUTH DAILY.   cholecalciferol 1000 units tablet Commonly known as:  VITAMIN D Take 1,000 Units by mouth daily.   cloNIDine 0.1 mg/24hr patch Commonly known as:  CATAPRES - Dosed in mg/24 hr PLACE 1 PATCH (0.1 MG TOTAL) ONTO THE SKIN ONCE A WEEK.  cloNIDine 0.1 mg/24hr patch Commonly known as:  CATAPRES-TTS-1 Place 1 patch (0.1 mg total) onto the skin once a week.   FLUoxetine 20 MG capsule Commonly known as:  PROZAC Take 1 capsule by mouth daily.   furosemide 20 MG tablet Commonly known as:  LASIX Take 1 tablet (20 mg total) by mouth daily as needed for edema.   HYDROcodone-homatropine 5-1.5 MG/5ML syrup Commonly known as:  HYCODAN Take 5 mLs by mouth every 8 (eight) hours as needed for cough.   lamoTRIgine 100 MG tablet Commonly known as:  LAMICTAL Take 1 tablet by mouth daily.   lisinopril 20 MG  tablet Commonly known as:  PRINIVIL,ZESTRIL TAKE 1 AND 1/2 TABLETS (30 MG TOTAL) BY MOUTH DAILY.   loratadine 10 MG tablet Commonly known as:  CLARITIN Take 1 tablet (10 mg total) by mouth daily.   metFORMIN 500 MG tablet Commonly known as:  GLUCOPHAGE Take 1 tablet (500 mg total) by mouth 2 (two) times daily with a meal.   metoprolol succinate 25 MG 24 hr tablet Commonly known as:  TOPROL-XL TAKE 2 TABLETS (50 MG TOTAL) BY MOUTH DAILY.   montelukast 10 MG tablet Commonly known as:  SINGULAIR Take 1 tablet (10 mg total) by mouth at bedtime.   oxyCODONE-acetaminophen 5-325 MG tablet Commonly known as:  PERCOCET Take 1 tablet by mouth every 6 (six) hours as needed for up to 3 days for severe pain.   pantoprazole 40 MG tablet Commonly known as:  PROTONIX Take 1 tablet (40 mg total) by mouth 2 (two) times daily.   promethazine 12.5 MG tablet Commonly known as:  PHENERGAN Take 1 tablet (12.5 mg total) by mouth every 6 (six) hours as needed for refractory nausea / vomiting.   protein supplement shake Liqd Commonly known as:  PREMIER PROTEIN Take 325 mLs (11 oz total) by mouth 3 (three) times daily between meals.   ranitidine 150 MG tablet Commonly known as:  ZANTAC TAKE 1 TABLET (150 MG TOTAL) BY MOUTH AT BEDTIME.   rosuvastatin 10 MG tablet Commonly known as:  CRESTOR Take 1 tablet (10 mg total) by mouth daily.   tiZANidine 4 MG tablet Commonly known as:  ZANAFLEX Take 4 mg by mouth 3 (three) times daily. 1-3 tablets per day as tolerated per Dr. Metta Clines   traZODone 100 MG tablet Commonly known as:  DESYREL Take 100 mg by mouth at bedtime.   vitamin B-12 1000 MCG tablet Commonly known as:  CYANOCOBALAMIN Take 1,000 mcg by mouth daily.   zolpidem 10 MG tablet Commonly known as:  AMBIEN Take 1 tablet by mouth at bedtime as needed.       Today  Patient seen and evaluated today No chest pain No abdominal pain Tolerated diet well  VITAL SIGNS:  Blood pressure  105/61, pulse 72, temperature 98.1 F (36.7 C), temperature source Oral, resp. rate 20, height 5\' 5"  (1.651 m), weight 126.1 kg (278 lb 1.6 oz), SpO2 97 %.  I/O:    Intake/Output Summary (Last 24 hours) at 10/03/2017 1515 Last data filed at 10/03/2017 1357 Gross per 24 hour  Intake 1940 ml  Output -  Net 1940 ml    PHYSICAL EXAMINATION:  Physical Exam  GENERAL:  52 y.o.-year-old patient lying in the bed with no acute distress.  LUNGS: Normal breath sounds bilaterally, no wheezing, rales,rhonchi or crepitation. No use of accessory muscles of respiration.  CARDIOVASCULAR: S1, S2 normal. No murmurs, rubs, or gallops.  ABDOMEN: Soft, non-tender, non-distended. Bowel sounds  present. No organomegaly or mass.  NEUROLOGIC: Moves all 4 extremities. PSYCHIATRIC: The patient is alert and oriented x 3.  SKIN: No obvious rash, lesion, or ulcer.   DATA REVIEW:   CBC Recent Labs  Lab 10/02/17 0549  WBC 10.8  HGB 13.2  HCT 39.4  PLT 334    Chemistries  Recent Labs  Lab 10/02/17 0549 10/03/17 0349  NA 138 135  K 3.3* 4.1  CL 99* 100*  CO2 29 29  GLUCOSE 166* 114*  BUN 6 9  CREATININE 0.86 0.67  CALCIUM 8.9 8.2*  MG 1.4*  --   AST 22  --   ALT 12*  --   ALKPHOS 56  --   BILITOT 0.6  --     Cardiac Enzymes Recent Labs  Lab 10/02/17 0549  TROPONINI <0.03    Microbiology Results  Results for orders placed or performed in visit on 11/12/16  Urine culture     Status: None   Collection Time: 11/12/16 12:00 AM  Result Value Ref Range Status   Urine Culture, Routine Final report  Final   Organism ID, Bacteria Comment  Final    Comment: Mixed urogenital flora 10,000-25,000 colony forming units per mL     RADIOLOGY:  Ct Abdomen Pelvis W Contrast  Result Date: 10/02/2017 CLINICAL DATA:  Abdominal pain. Nausea vomiting and diarrhea. Gastroparesis and gastritis. Known abdominal wall hernia. EXAM: CT ABDOMEN AND PELVIS WITH CONTRAST TECHNIQUE: Multidetector CT imaging of  the abdomen and pelvis was performed using the standard protocol following bolus administration of intravenous contrast. CONTRAST:  100mL ISOVUE-300 IOPAMIDOL (ISOVUE-300) INJECTION 61% COMPARISON:  Plain film 07/19/2017.  Most recent CT 03/16/2017. FINDINGS: Lower chest: Clear lung bases. Borderline cardiomegaly, without pericardial or pleural effusion. Hepatobiliary: Mild hepatomegaly at 18.5 cm craniocaudal. No focal liver lesion. Cholecystectomy, without biliary ductal dilatation. Pancreas: Normal, without mass or ductal dilatation. Spleen: Normal in size, without focal abnormality. Adrenals/Urinary Tract: Normal adrenal glands. Normal kidneys, without hydronephrosis. Normal urinary bladder. Stomach/Bowel: The gastric antrum is underdistended. Apparent mild wall thickening is felt to be secondary, including image 30/2. Appearance is similar to 03/16/2017. Colonic stool burden suggests constipation. Normal terminal ileum. Appendix surgically absent by report. Again identified is a right lower abdominal wall ventral hernia containing nonobstructive small bowel. No findings to suggest strangulation. Vascular/Lymphatic: Normal caliber of the aorta and branch vessels. No abdominopelvic adenopathy. Reproductive: Hysterectomy.  No adnexal mass. Other: No significant free fluid.  Mild pelvic floor laxity. Musculoskeletal: No acute osseous abnormality. IMPRESSION: 1.  No acute process in the abdomen or pelvis. 2. Small bowel containing right-sided ventral abdominal wall hernia, similar. No obstruction or strangulation identified. 3. Apparent gastric antral wall thickening is favored to be due to underdistention, and is similar to on the prior. Electronically Signed   By: Jeronimo GreavesKyle  Talbot M.D.   On: 10/02/2017 09:34    Follow up with PCP in 1 week.  Management plans discussed with the patient, family and they are in agreement.  CODE STATUS: Full code    Code Status Orders  (From admission, onward)        Start      Ordered   10/02/17 1226  Full code  Continuous     10/02/17 1226    Code Status History    Date Active Date Inactive Code Status Order ID Comments User Context   03/16/2017 0454 03/18/2017 1637 Full Code [id number]  Ihor AustinPyreddy, Ciro Tashiro, MD Inpatient   01/03/2017 0824 01/05/2017 1557 Full Code  [id number]  Ihor Austin, MD Inpatient   03/23/2016 0629 03/26/2016 1651 Full Code [id number]  Arnaldo Natal, MD Inpatient   09/28/2015 0454 09/29/2015 1720 Full Code [id number]  Ihor Austin, MD ED   05/05/2015 2036 05/09/2015 2006 Full Code [id number]  Clint Guy, Cletis Athens, MD ED   01/07/2015 1911 01/09/2015 1855 Full Code [id number]  Milagros Loll, MD ED      TOTAL TIME TAKING CARE OF THIS PATIENT ON DAY OF DISCHARGE: more than 34 minutes.   Ihor Austin M.D on 10/03/2017 at 3:15 PM  Between 7am to 6pm - Pager - [phone]  After 6pm go to [email or web address] - password [email or web address] [facility]  [facility] [place] [facility]  Office  [phone]  CC: Primary care physician; Malva Limes, MD  Note: This dictation was prepared with Dragon dictation along with smaller phrase technology. Any transcriptional errors that result from this process are unintentional.

## 2017-10-03 NOTE — Care Management Note (Signed)
Case Management Note  Patient Details  Name: Daisy [name] MRN: [id number] Date of Birth: 07-Jan-1966  Subjective/Objective:  Admitted to [facility] under observation status with abdominal pain. Lives with mother. Sister is [name] [phone]). Past seen Dr. Sherrie MustacheFisher March 5th, [facility] [place]. 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 No Home Health. No skilled nursing. No home oxygen. Shower chair in the home. Takes care of all basic activities of daily living herself, drives. Last fall was 2018. Appetite goes up and down. Family will transport.                 Action/Plan: Will continue to follow for discharge needs   Expected Discharge Date:                  Expected Discharge Plan:     In-House Referral:     Discharge planning Services     Post Acute Care Choice:    Choice offered to:     DME Arranged:    DME Agency:     HH Arranged:    HH Agency:     Status of Service:     If discussed at [employer] Length of Stay Meetings, dates discussed:    Additional Comments:  [name], RN MSN CCM Care Management [phone] 10/03/2017, 8:22 AM

## 2017-10-03 NOTE — Progress Notes (Signed)
PHARMACIST - PHYSICIAN COMMUNICATION  CONCERNING:  Enoxaparin (Lovenox) for DVT Prophylaxis    RECOMMENDATION: Patient was prescribed enoxaprin 40mg  q24 hours for VTE prophylaxis.   Filed Weights   10/02/17 0529 10/02/17 1230  Weight: 285 lb (129.3 kg) 278 lb 1.6 oz (126.1 kg)    Body mass index is 46.28 kg/m.  Estimated Creatinine Clearance: 113.6 mL/min (by C-G formula based on SCr of 0.67 mg/dL).   Based on [facility] policy patient is candidate for enoxaparin 40mg  every 12 hour dosing due to BMI being >40.   DESCRIPTION: Pharmacy has adjusted enoxaparin dose per [facility] policy, approved through P & T committee.  Patient is now receiving enoxaparin 40mg  every 12 hours.   Gardner CandleSheema M Saul Dorsi, PharmD, BCPS Clinical Pharmacist 10/03/2017 10:19 AM

## 2017-10-09 ENCOUNTER — Other Ambulatory Visit: Payer: Self-pay | Admitting: Family Medicine

## 2017-10-09 DIAGNOSIS — J301 Allergic rhinitis due to pollen: Secondary | ICD-10-CM

## 2017-10-10 IMAGING — CT CT ABD-PELV W/ CM
2 of 5 series · 16 of 46 positions shown, 18 images · IV contrast (APPLIED)
Comparison: CT scan 01/27/2016

CLINICAL DATA: Nausea and vomiting for 2 days, status post
cholecystectomy, hysterectomy, appendectomy, hernia repair, possible
gross right is

EXAM:
CT ABDOMEN AND PELVIS WITH CONTRAST
TECHNIQUE: Multidetector CT imaging of the abdomen and pelvis was performed
using the standard protocol following bolus administration of
intravenous contrast.
CONTRAST:  125 cc Isovue

[Series 2: axial st · axial · 0.86mm/px · z∈[-828,-404]mm · 13 of 96 slices shown, 15 images]
[im 6/96  soft-tissue]
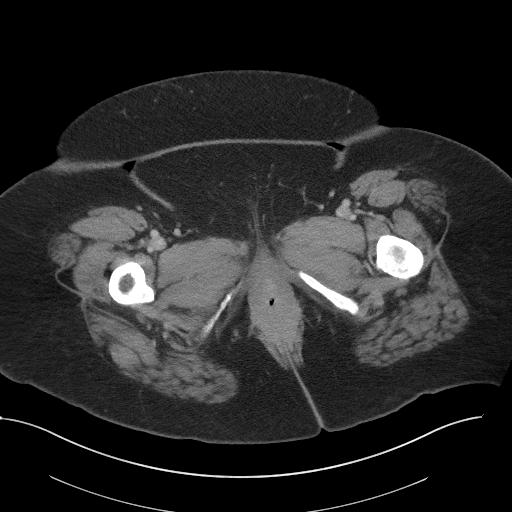
[im 6/96  bone]
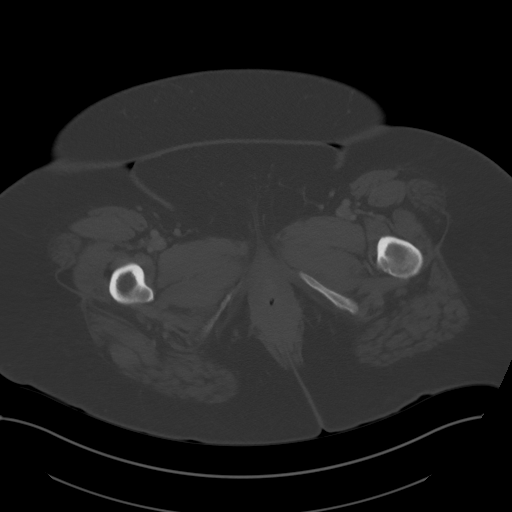
[im 16/96  soft-tissue]
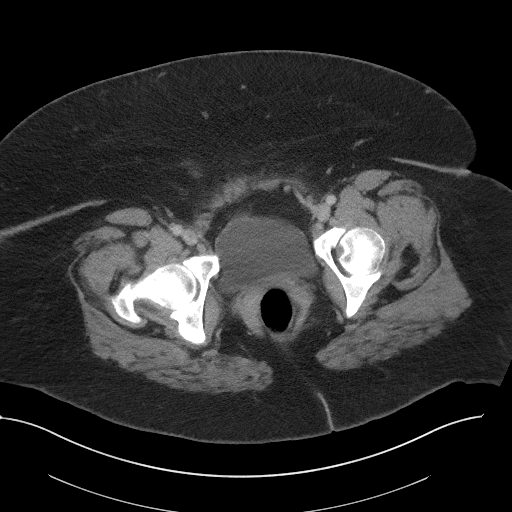
[im 21/96  soft-tissue]
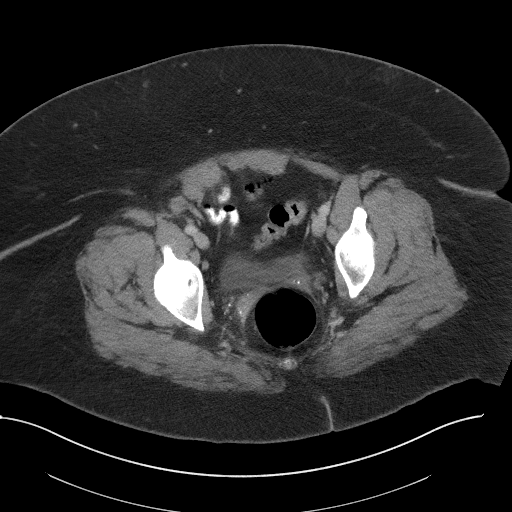
[im 26/96  soft-tissue]
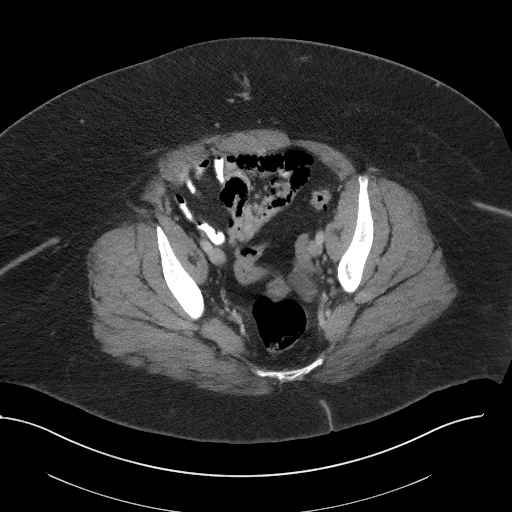
[im 36/96  soft-tissue]
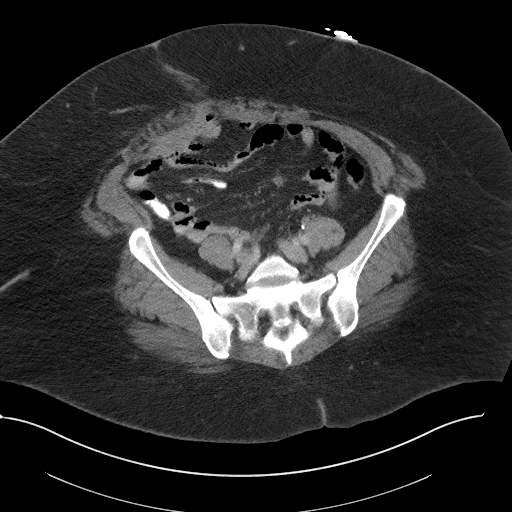
[im 41/96  soft-tissue]
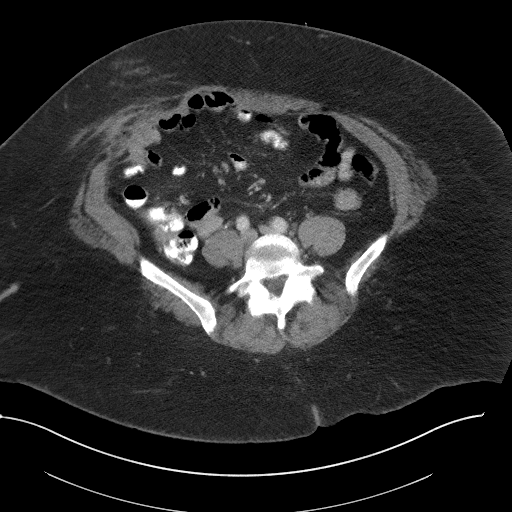
[im 51/96  soft-tissue]
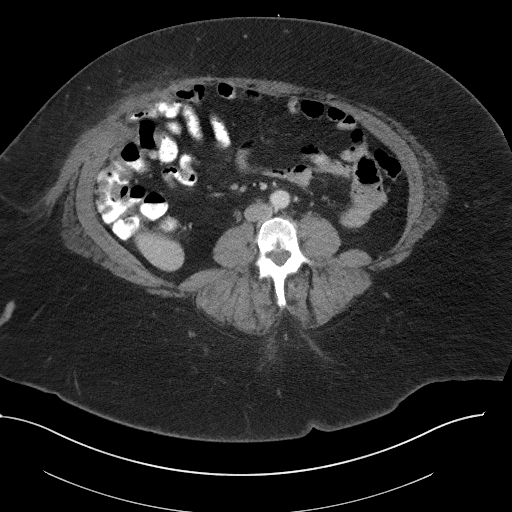
[im 56/96  soft-tissue]
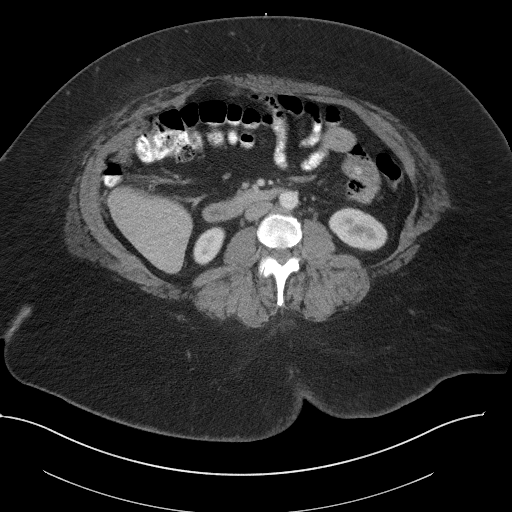
[im 61/96  soft-tissue]
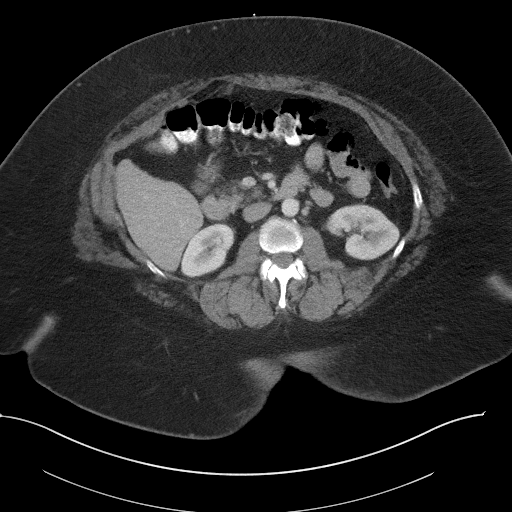
[im 61/96  bone]
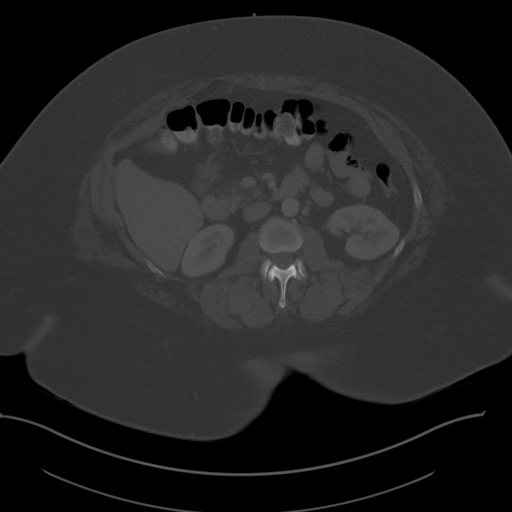
[im 71/96  soft-tissue]
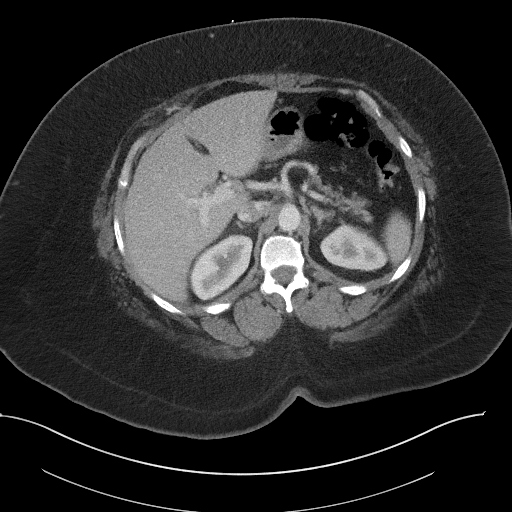
[im 76/96  soft-tissue]
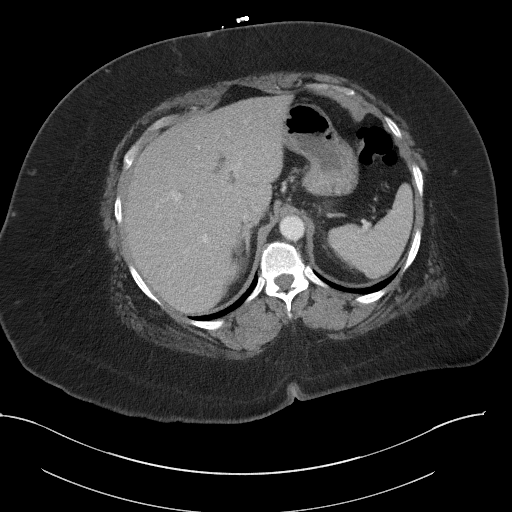
[im 81/96  soft-tissue]
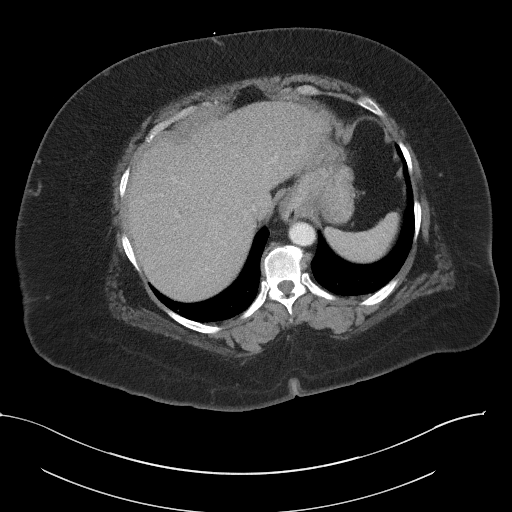
[im 91/96  soft-tissue]
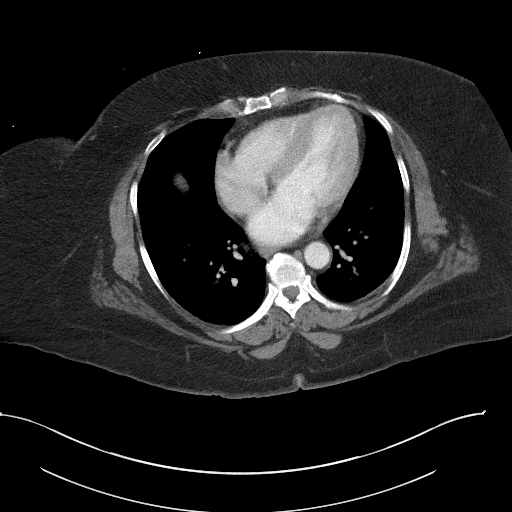

[Series 5: coronal st · coronal · 0.82mm/px · 3 of 104 slices shown]
[im 35/104  soft-tissue]
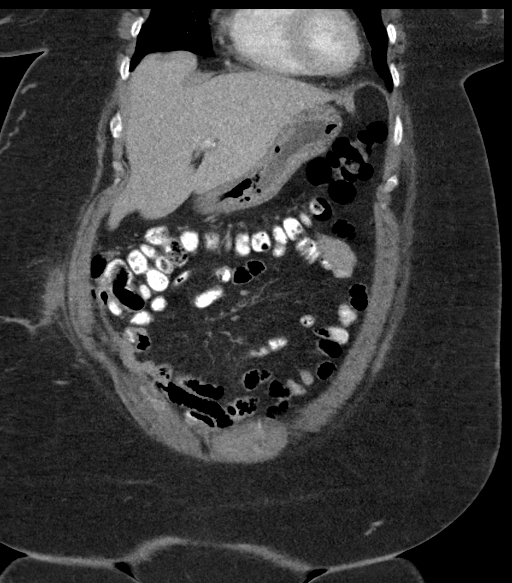
[im 46/104  soft-tissue]
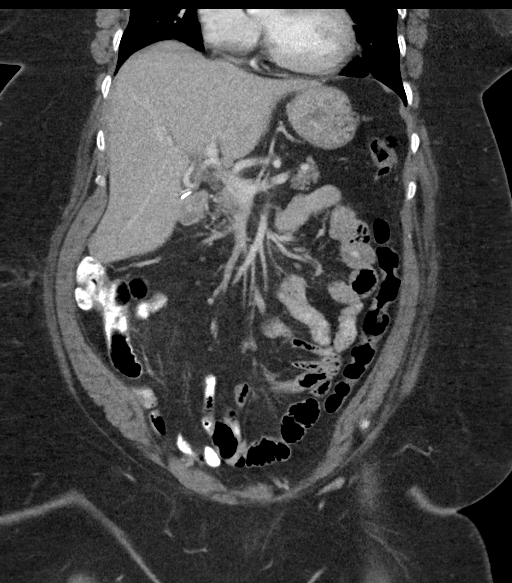
[im 58/104  soft-tissue]
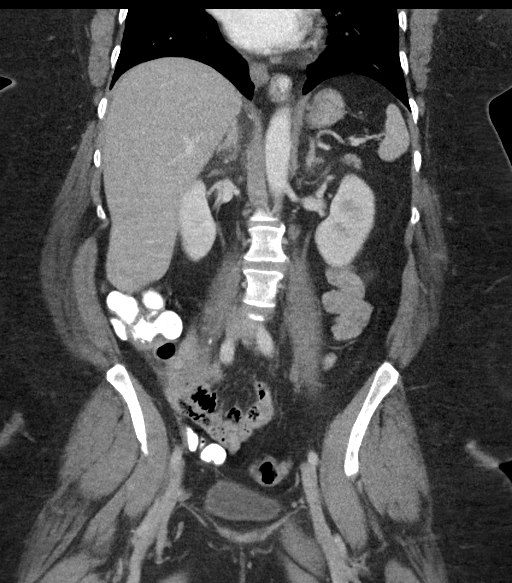

[16 of 46 positions shown; findings below may reference images not displayed]

FINDINGS: Lower chest: Lung bases are unremarkable. Small hiatal hernia is
noted. There are artifacts from patient's large body habitus.

Hepatobiliary: Enhanced liver is unremarkable. Status
postcholecystectomy.

Pancreas: Enhanced pancreas is unremarkable.

Spleen: Enhanced spleen is unremarkable.

Adrenals/Urinary Tract: No adrenal gland mass. Enhanced kidneys are
symmetrical in size. No hydronephrosis or hydroureter. Delayed renal
images shows bilateral renal symmetrical excretion. Bilateral
visualized proximal ureter is unremarkable. The urinary bladder is
under distended grossly unremarkable.

Stomach/Bowel: Oral contrast material was given to the patient.
There is no small bowel obstruction. Again noted chronic scarring
and subcutaneous stranding in right lower anterior abdominal wall
postsurgical in nature. No evidence of subcutaneous abscess. No
pericecal inflammation. The terminal ileum is unremarkable. The
patient is status post appendectomy. Some gas noted in distal
sigmoid colon and rectum. No colitis or diverticulitis. No distal
colonic obstruction

Vascular/Lymphatic: No aortic aneurysm. No retroperitoneal or
mesenteric adenopathy.

Reproductive: The patient is status post hysterectomy. There is a
left ovarian cyst measures 2.5 cm. Second left ovarian cyst measures
1.8 cm.

Other: No ascites or free abdominal air. There is small amount of
subcutaneous air in left anterior pelvic wall axial image 75
probable subcutaneous injection no inguinal adenopathy.

Musculoskeletal: Sagittal images of the spine shows no destructive
bony lesions. Mild degenerative changes lower thoracic spine.
IMPRESSION: 1. There is no evidence of acute inflammatory process within
abdomen.
2. Stable postsurgical changes and scarring in right lower anterior
abdominal wall.
3. No small bowel or colonic obstruction. Status post hysterectomy.
Status post cholecystectomy. Status post appendectomy.
4. There is a left ovarian cyst measures 2.5 cm. Second left ovarian
cyst measures 1.8 cm.

## 2017-10-11 ENCOUNTER — Encounter: Payer: Self-pay | Admitting: Family Medicine

## 2017-10-11 ENCOUNTER — Ambulatory Visit (INDEPENDENT_AMBULATORY_CARE_PROVIDER_SITE_OTHER): Payer: Medicare Other | Admitting: Family Medicine

## 2017-10-11 VITALS — BP 100/60 | HR 89 | Temp 98.4°F | Resp 18 | Wt 287.0 lb

## 2017-10-11 DIAGNOSIS — K3184 Gastroparesis: Secondary | ICD-10-CM | POA: Diagnosis not present

## 2017-10-11 DIAGNOSIS — R609 Edema, unspecified: Secondary | ICD-10-CM

## 2017-10-11 MED ORDER — ROSUVASTATIN CALCIUM 10 MG PO TABS: 10.0000 mg | ORAL_TABLET | Freq: Every day | ORAL | 3 refills | Status: DC

## 2017-10-11 MED ORDER — RANITIDINE HCL 150 MG PO TABS: 150.0000 mg | ORAL_TABLET | Freq: Every day | ORAL | 4 refills | Status: DC

## 2017-10-11 MED ORDER — PROMETHAZINE HCL 12.5 MG PO TABS: 12.5000 mg | ORAL_TABLET | Freq: Four times a day (QID) | ORAL | 3 refills | Status: DC | PRN

## 2017-10-11 NOTE — Progress Notes (Signed)
Patient: Daisy [name] Female    DOB: 1965/09/12   52 y.o.   MRN: [id number] Visit Date: 10/11/2017  Today's Provider: Mila Merry, MD   Chief Complaint  Patient presents with  . Hospitalization Follow-up   Subjective:    HPI   Follow up Hospitalization  Patient was admitted to [facility] on 10/02/2017 and discharged on 10/03/2017. She was treated for Gastroparesis, abdominal pain, NVD, HTN Treatment for this included; patient received IV fluids. antiemetic medication given for nausea and vomiting. CT abdominal ordered, showing a ventral abdominal hernia. Per discharge summary, pt to follow-up with pcp in 1 week. She reports good compliance with treatment. She reports this condition is Improved. Patient states she still has abdominal pain, nausea and vomiting, swelling and pain in both legs.  Her primary complaint today is she has had more swelling in legs since discharged. She head metoprolol and amlodipine discontinued, but is still taking furosemide and lisinopril. Denies dyspnea or chest pain. States she is eating and drinking well. No pain or redness in LEs.   She states that Dr. Metta Clines has already initiated a referral to GI and [facility].   ------------------------------------------------------------------------------------     Allergies  Allergen Reactions  . Acetaminophen-Codeine Hives  . Atorvastatin Other (See Comments)    Muscle pain  . Codeine Hives  . Ibuprofen Nausea And Vomiting  . Morphine Nausea And Vomiting  . Propoxyphene Other (See Comments)    Reaction:  GI upset   . Sulfa Antibiotics Hives  . Duloxetine Rash and Other (See Comments)    Yellowing of the skin     Current Outpatient Medications:  .  alprazolam (XANAX) 2 MG tablet, Take 2 mg by mouth 3 (three) times daily as needed for anxiety. , Disp: , Rfl:  .  cholecalciferol (VITAMIN D) 1000 units tablet, Take 1,000 Units by mouth daily., Disp: , Rfl:  .  cloNIDine (CATAPRES - DOSED IN MG/24  HR) 0.1 mg/24hr patch, PLACE 1 PATCH (0.1 MG TOTAL) ONTO THE SKIN ONCE A WEEK., Disp: 4 patch, Rfl: 6 .  cloNIDine (CATAPRES-TTS-1) 0.1 mg/24hr patch, Place 1 patch (0.1 mg total) onto the skin once a week., Disp: 12 patch, Rfl: 4 .  FLUoxetine (PROZAC) 20 MG capsule, Take 1 capsule by mouth daily., Disp: , Rfl:  .  furosemide (LASIX) 20 MG tablet, Take 1 tablet (20 mg total) by mouth daily as needed for edema., Disp: 90 tablet, Rfl: 4 .  lamoTRIgine (LAMICTAL) 100 MG tablet, Take 1 tablet by mouth daily., Disp: , Rfl:  .  loratadine (CLARITIN) 10 MG tablet, Take 1 tablet (10 mg total) by mouth daily., Disp: 30 tablet, Rfl: 0 .  metFORMIN (GLUCOPHAGE) 500 MG tablet, Take 1 tablet (500 mg total) by mouth 2 (two) times daily with a meal., Disp: 180 tablet, Rfl: 4 .  montelukast (SINGULAIR) 10 MG tablet, TAKE 1 TABLET BY MOUTH EVERYDAY AT BEDTIME, Disp: 90 tablet, Rfl: 4 .  pantoprazole (PROTONIX) 40 MG tablet, Take 1 tablet (40 mg total) by mouth 2 (two) times daily., Disp: 180 tablet, Rfl: 4 .  promethazine (PHENERGAN) 12.5 MG tablet, Take 1 tablet (12.5 mg total) by mouth every 6 (six) hours as needed for refractory nausea / vomiting., Disp: 30 tablet, Rfl: 3 .  protein supplement shake (PREMIER PROTEIN) LIQD, Take 325 mLs (11 oz total) by mouth 3 (three) times daily between meals., Disp: 30 Can, Rfl: 0 .  ranitidine (ZANTAC) 150 MG tablet, TAKE  1 TABLET (150 MG TOTAL) BY MOUTH AT BEDTIME., Disp: 90 tablet, Rfl: 4 .  tiZANidine (ZANAFLEX) 4 MG tablet, Take 4 mg by mouth 3 (three) times daily. 1-3 tablets per day as tolerated per Dr. Metta Clines, Disp: , Rfl:  .  traZODone (DESYREL) 100 MG tablet, Take 100 mg by mouth at bedtime., Disp: , Rfl:  .  vitamin B-12 (CYANOCOBALAMIN) 1000 MCG tablet, Take 1,000 mcg by mouth daily., Disp: , Rfl:  .  zolpidem (AMBIEN) 10 MG tablet, Take 1 tablet by mouth at bedtime as needed., Disp: , Rfl:  .  albuterol (PROVENTIL HFA;VENTOLIN HFA) 108 (90 Base) MCG/ACT inhaler,  Inhale 2 puffs into the lungs every 6 (six) hours as needed for wheezing or shortness of breath., Disp: , Rfl:  .  amLODipine (NORVASC) 5 MG tablet, TAKE 1 TABLET (5 MG TOTAL) BY MOUTH DAILY. (Patient not taking: Reported on 10/11/2017), Disp: 90 tablet, Rfl: 4 .  lisinopril (PRINIVIL,ZESTRIL) 20 MG tablet, TAKE 1 AND 1/2 TABLETS (30 MG TOTAL) BY MOUTH DAILY. (Patient not taking: Reported on 10/11/2017), Disp: 90 tablet, Rfl: 4 .  metoprolol succinate (TOPROL-XL) 25 MG 24 hr tablet, TAKE 2 TABLETS (50 MG TOTAL) BY MOUTH DAILY. (Patient not taking: Reported on 10/11/2017), Disp: 180 tablet, Rfl: 3 .  rosuvastatin (CRESTOR) 10 MG tablet, Take 1 tablet (10 mg total) by mouth daily. (Patient not taking: Reported on 08/15/2017), Disp: 30 tablet, Rfl: 3  Review of Systems  Constitutional: Positive for fatigue. Negative for appetite change, chills and fever.  Respiratory: Negative for chest tightness and shortness of breath.   Cardiovascular: Positive for leg swelling. Negative for chest pain and palpitations.  Gastrointestinal: Positive for abdominal pain, nausea and vomiting.  Musculoskeletal: Positive for myalgias.  Neurological: Negative for dizziness and weakness.    Social History   Tobacco Use  . Smoking status: Never Smoker  . Smokeless tobacco: Former Neurosurgeon    Types: Snuff  Substance Use Topics  . Alcohol use: Yes    Alcohol/week: 0.0 oz    Comment: only on special occasions- wine   Objective:   BP 100/60 (BP Location: Left Arm, Patient Position: Sitting, Cuff Size: Large)   Pulse 89   Temp 98.4 F (36.9 C) (Oral)   Resp 18   Wt 287 lb (130.2 kg)   SpO2 94% Comment: room air  BMI 47.76 kg/m  Vitals:   10/11/17 1431 10/11/17 1436  BP: 100/60 100/60  Pulse: 89   Resp: 18   Temp: 98.4 F (36.9 C)   TempSrc: Oral   SpO2: 94%   Weight: 287 lb (130.2 kg)      Physical Exam  General Appearance:    Alert, cooperative, no distress, obese  Eyes:    PERRL, conjunctiva/corneas  clear, EOM's intact       Lungs:     Clear to auscultation bilaterally, respirations unlabored  Heart:    Regular rate and rhythm  Ext::   3+ bilateral LE edema, slightly tender, no erythema. No cords.         Assessment & Plan:     1. Edema, unspecified type  - Renal function panel - TSH - T4, free - Hepatic function panel Keep amlodipine and metoprolol on hold for the time being. Consider increasing furosemide if labs are normal.   2. Gastroparesis Doing much better since hospital discharge. She states referral to GI at [facility] is already in progress. Advised to let me know if she needs referral from PCP.  Lelon Huh, MD  [facility]

## 2017-10-12 LAB — RENAL FUNCTION PANEL
Albumin: 4.3 g/dL (ref 3.5–5.5)
BUN/Creatinine Ratio: 12 (ref 9–23)
BUN: 10 mg/dL (ref 6–24)
CO2: 28 mmol/L (ref 20–29)
Calcium: 9.5 mg/dL (ref 8.7–10.2)
Chloride: 96 mmol/L (ref 96–106)
Creatinine, Ser: 0.83 mg/dL (ref 0.57–1.00)
GFR calc Af Amer: 96 mL/min/{1.73_m2}
GFR calc non Af Amer: 83 mL/min/{1.73_m2}
Glucose: 81 mg/dL (ref 65–99)
Phosphorus: 4.1 mg/dL (ref 2.5–4.5)
Potassium: 4.5 mmol/L (ref 3.5–5.2)
Sodium: 140 mmol/L (ref 134–144)

## 2017-10-12 LAB — T4, FREE: Free T4: 1.04 ng/dL (ref 0.82–1.77)

## 2017-10-12 LAB — HEPATIC FUNCTION PANEL
ALT: 26 IU/L (ref 0–32)
AST: 21 IU/L (ref 0–40)
Alkaline Phosphatase: 64 IU/L (ref 39–117)
BILIRUBIN TOTAL: 0.3 mg/dL (ref 0.0–1.2)
Bilirubin, Direct: 0.09 mg/dL (ref 0.00–0.40)
TOTAL PROTEIN: 7.2 g/dL (ref 6.0–8.5)

## 2017-10-12 LAB — TSH: TSH: 1.23 u[IU]/mL (ref 0.450–4.500)

## 2017-10-17 DIAGNOSIS — M5481 Occipital neuralgia: Secondary | ICD-10-CM | POA: Diagnosis not present

## 2017-10-17 DIAGNOSIS — Z5181 Encounter for therapeutic drug level monitoring: Secondary | ICD-10-CM | POA: Diagnosis not present

## 2017-10-17 DIAGNOSIS — M5030 Other cervical disc degeneration, unspecified cervical region: Secondary | ICD-10-CM | POA: Diagnosis not present

## 2017-10-17 DIAGNOSIS — M5136 Other intervertebral disc degeneration, lumbar region: Secondary | ICD-10-CM | POA: Diagnosis not present

## 2017-10-24 DIAGNOSIS — Z5181 Encounter for therapeutic drug level monitoring: Secondary | ICD-10-CM | POA: Diagnosis not present

## 2017-10-24 DIAGNOSIS — M5136 Other intervertebral disc degeneration, lumbar region: Secondary | ICD-10-CM | POA: Diagnosis not present

## 2017-10-24 DIAGNOSIS — M5481 Occipital neuralgia: Secondary | ICD-10-CM | POA: Diagnosis not present

## 2017-10-24 DIAGNOSIS — M5030 Other cervical disc degeneration, unspecified cervical region: Secondary | ICD-10-CM | POA: Diagnosis not present

## 2017-10-31 ENCOUNTER — Telehealth: Payer: Self-pay

## 2017-10-31 NOTE — Telephone Encounter (Signed)
Patient called the office requesting an appointment. She states she has ben sick with a head cold for over 1 week. She is still having abdominal pain from the gastroparesis along with weakness, nausea and vomiting. Patient reports these symptoms have worsened within the past week. Patient also complains of nose bleeds. I advised patient that since her abdominal pain has worsened since the last office visit, that she should be seen at the ER. Patient agreed to go to ER.

## 2017-11-03 NOTE — Progress Notes (Signed)
Patient: Daisy [name] Female    DOB: Apr 13, 1966   52 y.o.   MRN: [id number] Visit Date: 11/04/2017  Today's Provider: Mila Merry, MD   Chief Complaint  Patient presents with  . URI   Subjective:    URI   This is a new problem. Episode onset: 9 days. The problem has been unchanged. Maximum temperature: low grade. Associated symptoms include abdominal pain, coughing (dry cough), diarrhea, headaches, nausea, rhinorrhea, sinus pain, sneezing and vomiting. Pertinent negatives include no chest pain or congestion. She has tried antihistamine (Mucinex) for the symptoms.      Allergies  Allergen Reactions  . Acetaminophen-Codeine Hives  . Atorvastatin Other (See Comments)    Muscle pain  . Codeine Hives  . Ibuprofen Nausea And Vomiting  . Morphine Nausea And Vomiting  . Propoxyphene Other (See Comments)    Reaction:  GI upset   . Sulfa Antibiotics Hives  . Duloxetine Rash and Other (See Comments)    Yellowing of the skin     Current Outpatient Medications:  .  albuterol (PROVENTIL HFA;VENTOLIN HFA) 108 (90 Base) MCG/ACT inhaler, Inhale 2 puffs into the lungs every 6 (six) hours as needed for wheezing or shortness of breath., Disp: , Rfl:  .  alprazolam (XANAX) 2 MG tablet, Take 2 mg by mouth 3 (three) times daily as needed for anxiety. , Disp: , Rfl:  .  amLODipine (NORVASC) 5 MG tablet, TAKE 1 TABLET (5 MG TOTAL) BY MOUTH DAILY., Disp: 90 tablet, Rfl: 4  (PATIENT NOT TAKING) .  cholecalciferol (VITAMIN D) 1000 units tablet, Take 1,000 Units by mouth daily., Disp: , Rfl:  .  cloNIDine (CATAPRES - DOSED IN MG/24 HR) 0.1 mg/24hr patch, PLACE 1 PATCH (0.1 MG TOTAL) ONTO THE SKIN ONCE A WEEK., Disp: 4 patch, Rfl: 6 .  cloNIDine (CATAPRES-TTS-1) 0.1 mg/24hr patch, Place 1 patch (0.1 mg total) onto the skin once a week., Disp: 12 patch, Rfl: 4 .  FLUoxetine (PROZAC) 20 MG capsule, Take 1 capsule by mouth daily., Disp: , Rfl:  .  furosemide (LASIX) 20 MG tablet, Take 1 tablet  (20 mg total) by mouth daily as needed for edema., Disp: 90 tablet, Rfl: 4 .  lamoTRIgine (LAMICTAL) 100 MG tablet, Take 1 tablet by mouth daily., Disp: , Rfl:  .  lisinopril (PRINIVIL,ZESTRIL) 20 MG tablet, TAKE 1 AND 1/2 TABLETS (30 MG TOTAL) BY MOUTH DAILY., Disp: 90 tablet, Rfl: 4 .  loratadine (CLARITIN) 10 MG tablet, Take 1 tablet (10 mg total) by mouth daily., Disp: 30 tablet, Rfl: 0 .  metFORMIN (GLUCOPHAGE) 500 MG tablet, Take 1 tablet (500 mg total) by mouth 2 (two) times daily with a meal., Disp: 180 tablet, Rfl: 4 .  metoprolol succinate (TOPROL-XL) 25 MG 24 hr tablet, TAKE 2 TABLETS (50 MG TOTAL) BY MOUTH DAILY., Disp: 180 tablet, Rfl: 3 (PATIENT NOT TAKING) .  montelukast (SINGULAIR) 10 MG tablet, TAKE 1 TABLET BY MOUTH EVERYDAY AT BEDTIME, Disp: 90 tablet, Rfl: 4 .  Oxycodone HCl 10 MG TABS, Take 1 tablet by mouth every 6 (six) hours as needed., Disp: , Rfl:  .  pantoprazole (PROTONIX) 40 MG tablet, Take 1 tablet (40 mg total) by mouth 2 (two) times daily., Disp: 180 tablet, Rfl: 4 .  promethazine (PHENERGAN) 12.5 MG tablet, Take 1 tablet (12.5 mg total) by mouth every 6 (six) hours as needed for refractory nausea / vomiting., Disp: 30 tablet, Rfl: 3 .  protein supplement shake (PREMIER  PROTEIN) LIQD, Take 325 mLs (11 oz total) by mouth 3 (three) times daily between meals., Disp: 30 Can, Rfl: 0 .  ranitidine (ZANTAC) 150 MG tablet, Take 1 tablet (150 mg total) by mouth at bedtime., Disp: 90 tablet, Rfl: 4 .  rosuvastatin (CRESTOR) 10 MG tablet, Take 1 tablet (10 mg total) by mouth daily., Disp: 30 tablet, Rfl: 3 .  tiZANidine (ZANAFLEX) 4 MG tablet, Take 4 mg by mouth 3 (three) times daily. 1-3 tablets per day as tolerated per Dr. Metta Clines, Disp: , Rfl:  .  traZODone (DESYREL) 100 MG tablet, Take 100 mg by mouth at bedtime., Disp: , Rfl:  .  vitamin B-12 (CYANOCOBALAMIN) 1000 MCG tablet, Take 1,000 mcg by mouth daily., Disp: , Rfl:  .  zolpidem (AMBIEN) 10 MG tablet, Take 1 tablet by  mouth at bedtime as needed., Disp: , Rfl:   Review of Systems  Constitutional: Negative for appetite change, chills, fatigue and fever.       Hypersomnia  HENT: Positive for nosebleeds (resolved), postnasal drip, rhinorrhea, sinus pressure, sinus pain and sneezing. Negative for congestion.   Respiratory: Positive for cough (dry cough) and shortness of breath. Negative for chest tightness.   Cardiovascular: Positive for leg swelling. Negative for chest pain and palpitations.  Gastrointestinal: Positive for abdominal pain, diarrhea, nausea and vomiting.  Genitourinary:       Vaginal itching and odor  Neurological: Positive for weakness and headaches. Negative for dizziness.    Social History   Tobacco Use  . Smoking status: Never Smoker  . Smokeless tobacco: Former Neurosurgeon    Types: Snuff  Substance Use Topics  . Alcohol use: Yes    Alcohol/week: 0.0 oz    Comment: only on special occasions- wine   Objective:   BP 92/62 (BP Location: Left Arm, Patient Position: Sitting, Cuff Size: Large)   Pulse 97   Temp 99.2 F (37.3 C) (Oral)   Resp 18   Wt 286 lb (129.7 kg)   SpO2 98% Comment: room air  BMI 47.59 kg/m  Vitals:   11/04/17 1043  BP: 92/62  Pulse: 97  Resp: 18  Temp: 99.2 F (37.3 C)  TempSrc: Oral  SpO2: 98%  Weight: 286 lb (129.7 kg)     Physical Exam  General Appearance:    Alert, cooperative, no distress  HENT:   right, bilateral TM normal without fluid or infection, neck without nodes, pharynx erythematous without exudate, right frontal and maxillary sinus tender and nasal mucosa congested  Eyes:    PERRL, conjunctiva/corneas clear, EOM's intact       Lungs:     Clear to auscultation bilaterally, respirations unlabored  Heart:    Regular rate and rhythm, 2+ bipedal edema.   Neurologic:   Awake, alert, oriented x 3. No apparent focal neurological           defect.           Assessment & Plan:     1. Sinusitis, unspecified chronicity, unspecified  location I - amoxicillin (AMOXIL) 500 MG capsule; Take 2 capsules (1,000 mg total) by mouth 2 (two) times daily for 7 days.  Dispense: 28 capsule; Refill: 0  2. Essential hypertension BP has been running low and she is feeling fatigued, she states she has been off of metoprolol and amlodipine for several weeks. Will reduce lisinopril as below.  - lisinopril (PRINIVIL,ZESTRIL) 20 MG tablet; Take 1 tablet (20 mg total) by mouth daily.  Dispense: 3 tablet; Refill: 0  Recheck BP at [facility] in July.   3. Morbid obesity (HCC) She is interested in bariatric surgery for weight loss, however her more immediate concern is hernia as below. She requests referral to Dr. Lily Peer at [facility] to address both issues.  - Ambulatory referral to General Surgery  4. Incisional hernia, without obstruction or gangrene Has had several ER visits for recurrent abdominal hernia pain. She requests referral to surgery to discuss repair.  - Ambulatory referral to General Surgery       Mila Merry, MD  [facility]

## 2017-11-04 ENCOUNTER — Ambulatory Visit (INDEPENDENT_AMBULATORY_CARE_PROVIDER_SITE_OTHER): Payer: Medicare Other | Admitting: Family Medicine

## 2017-11-04 ENCOUNTER — Encounter: Payer: Self-pay | Admitting: Family Medicine

## 2017-11-04 VITALS — BP 92/62 | HR 97 | Temp 99.2°F | Resp 18 | Wt 286.0 lb

## 2017-11-04 DIAGNOSIS — J329 Chronic sinusitis, unspecified: Secondary | ICD-10-CM | POA: Diagnosis not present

## 2017-11-04 DIAGNOSIS — I10 Essential (primary) hypertension: Secondary | ICD-10-CM

## 2017-11-04 DIAGNOSIS — K432 Incisional hernia without obstruction or gangrene: Secondary | ICD-10-CM | POA: Diagnosis not present

## 2017-11-04 MED ORDER — FLUCONAZOLE 150 MG PO TABS: 150.0000 mg | ORAL_TABLET | Freq: Once | ORAL | 0 refills | Status: AC

## 2017-11-04 MED ORDER — LISINOPRIL 20 MG PO TABS: 20.0000 mg | ORAL_TABLET | Freq: Every day | ORAL | 0 refills | Status: DC

## 2017-11-04 MED ORDER — AMOXICILLIN 500 MG PO CAPS: 1000.0000 mg | ORAL_CAPSULE | Freq: Two times a day (BID) | ORAL | 0 refills | Status: AC

## 2017-11-08 ENCOUNTER — Ambulatory Visit: Payer: Medicare Other | Admitting: Gastroenterology

## 2017-11-08 ENCOUNTER — Telehealth: Payer: Self-pay | Admitting: Gastroenterology

## 2017-11-08 ENCOUNTER — Other Ambulatory Visit: Payer: Self-pay

## 2017-11-08 NOTE — Telephone Encounter (Signed)
No show. Patient stated she has a GI dr and going to [facility].

## 2017-11-21 DIAGNOSIS — M5136 Other intervertebral disc degeneration, lumbar region: Secondary | ICD-10-CM | POA: Diagnosis not present

## 2017-11-21 DIAGNOSIS — M5030 Other cervical disc degeneration, unspecified cervical region: Secondary | ICD-10-CM | POA: Diagnosis not present

## 2017-11-21 DIAGNOSIS — Z5181 Encounter for therapeutic drug level monitoring: Secondary | ICD-10-CM | POA: Diagnosis not present

## 2017-11-21 DIAGNOSIS — M5481 Occipital neuralgia: Secondary | ICD-10-CM | POA: Diagnosis not present

## 2017-11-28 ENCOUNTER — Observation Stay
Admission: EM | Admit: 2017-11-28 | Discharge: 2017-11-30 | Disposition: A | Discharge status: Home / Self Care | Payer: Medicare Other | Attending: Internal Medicine | Admitting: Internal Medicine

## 2017-11-28 DIAGNOSIS — K3184 Gastroparesis: Secondary | ICD-10-CM | POA: Diagnosis not present

## 2017-11-28 DIAGNOSIS — Z79899 Other long term (current) drug therapy: Secondary | ICD-10-CM | POA: Diagnosis not present

## 2017-11-28 DIAGNOSIS — Z87891 Personal history of nicotine dependence: Secondary | ICD-10-CM | POA: Diagnosis not present

## 2017-11-28 DIAGNOSIS — K449 Diaphragmatic hernia without obstruction or gangrene: Secondary | ICD-10-CM | POA: Insufficient documentation

## 2017-11-28 DIAGNOSIS — Z6841 Body Mass Index (BMI) 40.0 and over, adult: Secondary | ICD-10-CM | POA: Insufficient documentation

## 2017-11-28 DIAGNOSIS — R112 Nausea with vomiting, unspecified: Secondary | ICD-10-CM | POA: Diagnosis not present

## 2017-11-28 DIAGNOSIS — G8929 Other chronic pain: Secondary | ICD-10-CM | POA: Insufficient documentation

## 2017-11-28 DIAGNOSIS — Z885 Allergy status to narcotic agent status: Secondary | ICD-10-CM | POA: Insufficient documentation

## 2017-11-28 DIAGNOSIS — Z882 Allergy status to sulfonamides status: Secondary | ICD-10-CM | POA: Insufficient documentation

## 2017-11-28 DIAGNOSIS — K219 Gastro-esophageal reflux disease without esophagitis: Secondary | ICD-10-CM | POA: Diagnosis not present

## 2017-11-28 DIAGNOSIS — Z886 Allergy status to analgesic agent status: Secondary | ICD-10-CM | POA: Diagnosis not present

## 2017-11-28 DIAGNOSIS — E1143 Type 2 diabetes mellitus with diabetic autonomic (poly)neuropathy: Principal | ICD-10-CM | POA: Insufficient documentation

## 2017-11-28 DIAGNOSIS — Z7984 Long term (current) use of oral hypoglycemic drugs: Secondary | ICD-10-CM | POA: Diagnosis not present

## 2017-11-28 DIAGNOSIS — Z9049 Acquired absence of other specified parts of digestive tract: Secondary | ICD-10-CM | POA: Diagnosis not present

## 2017-11-28 DIAGNOSIS — R1013 Epigastric pain: Secondary | ICD-10-CM

## 2017-11-28 DIAGNOSIS — Z888 Allergy status to other drugs, medicaments and biological substances status: Secondary | ICD-10-CM | POA: Insufficient documentation

## 2017-11-28 DIAGNOSIS — I10 Essential (primary) hypertension: Secondary | ICD-10-CM | POA: Diagnosis present

## 2017-11-28 DIAGNOSIS — Z90710 Acquired absence of both cervix and uterus: Secondary | ICD-10-CM | POA: Diagnosis not present

## 2017-11-28 DIAGNOSIS — E876 Hypokalemia: Secondary | ICD-10-CM | POA: Diagnosis not present

## 2017-11-28 DIAGNOSIS — E119 Type 2 diabetes mellitus without complications: Secondary | ICD-10-CM | POA: Diagnosis not present

## 2017-11-28 DIAGNOSIS — F3181 Bipolar II disorder: Secondary | ICD-10-CM | POA: Diagnosis not present

## 2017-11-28 DIAGNOSIS — F419 Anxiety disorder, unspecified: Secondary | ICD-10-CM | POA: Diagnosis not present

## 2017-11-28 DIAGNOSIS — E860 Dehydration: Secondary | ICD-10-CM | POA: Insufficient documentation

## 2017-11-28 DIAGNOSIS — E785 Hyperlipidemia, unspecified: Secondary | ICD-10-CM | POA: Diagnosis not present

## 2017-11-28 LAB — CBC WITH DIFFERENTIAL/PLATELET
BASOS ABS: 0.1 10*3/uL (ref 0–0.1)
Basophils Relative: 1 %
EOS PCT: 0 %
Eosinophils Absolute: 0 10*3/uL (ref 0–0.7)
HEMATOCRIT: 37.5 % (ref 35.0–47.0)
Hemoglobin: 12.4 g/dL (ref 12.0–16.0)
LYMPHS ABS: 1.9 10*3/uL (ref 1.0–3.6)
LYMPHS PCT: 20 %
MCH: 28.8 pg (ref 26.0–34.0)
MCHC: 32.9 g/dL (ref 32.0–36.0)
MCV: 87.3 fL (ref 80.0–100.0)
Monocytes Absolute: 0.3 10*3/uL (ref 0.2–0.9)
Monocytes Relative: 3 %
NEUTROS ABS: 7.4 10*3/uL — AB (ref 1.4–6.5)
Neutrophils Relative %: 76 %
PLATELETS: 313 10*3/uL (ref 150–440)
RBC: 4.3 MIL/uL (ref 3.80–5.20)
RDW: 14.7 % — ABNORMAL HIGH (ref 11.5–14.5)
WBC: 9.7 10*3/uL (ref 3.6–11.0)

## 2017-11-28 LAB — COMPREHENSIVE METABOLIC PANEL
ALBUMIN: 4.3 g/dL (ref 3.5–5.0)
ALK PHOS: 52 U/L (ref 38–126)
ALT: 13 U/L — AB (ref 14–54)
AST: 19 U/L (ref 15–41)
Anion gap: 14 (ref 5–15)
BUN: 10 mg/dL (ref 6–20)
CALCIUM: 9.4 mg/dL (ref 8.9–10.3)
CHLORIDE: 100 mmol/L — AB (ref 101–111)
CO2: 24 mmol/L (ref 22–32)
Creatinine, Ser: 0.9 mg/dL (ref 0.44–1.00)
GFR calc Af Amer: >60 mL/min (ref >60)
GFR calc non Af Amer: >60 mL/min (ref >60)
GLUCOSE: 147 mg/dL — AB (ref 65–99)
Potassium: 3.4 mmol/L — ABNORMAL LOW (ref 3.5–5.1)
SODIUM: 138 mmol/L (ref 135–145)
Total Bilirubin: 0.7 mg/dL (ref 0.3–1.2)
Total Protein: 8.7 g/dL — ABNORMAL HIGH (ref 6.5–8.1)

## 2017-11-28 LAB — LIPASE, BLOOD: Lipase: 26 U/L (ref 11–51)

## 2017-11-28 LAB — TROPONIN I: Troponin I: <0.03 ng/mL (ref <0.03)

## 2017-11-28 MED ORDER — HYDROMORPHONE HCL 1 MG/ML IJ SOLN
1.0000 mg | Freq: Once | INTRAMUSCULAR | Status: AC
  Administered 2017-11-28: 1 mg via INTRAMUSCULAR
  Filled 2017-11-28: qty 1

## 2017-11-28 MED ORDER — PROMETHAZINE HCL 25 MG/ML IJ SOLN
25.0000 mg | Freq: Once | INTRAMUSCULAR | Status: AC
  Administered 2017-11-28: 25 mg via INTRAVENOUS
  Filled 2017-11-28: qty 1

## 2017-11-28 MED ORDER — SODIUM CHLORIDE 0.9 % IV BOLUS
1000.0000 mL | Freq: Once | INTRAVENOUS | Status: AC
  Administered 2017-11-28: 1000 mL via INTRAVENOUS

## 2017-11-28 MED ORDER — METOCLOPRAMIDE HCL 5 MG/ML IJ SOLN
10.0000 mg | Freq: Once | INTRAMUSCULAR | Status: AC
  Administered 2017-11-28: 5 mg via INTRAVENOUS

## 2017-11-28 MED ORDER — METOCLOPRAMIDE HCL 5 MG/ML IJ SOLN
INTRAMUSCULAR | Status: AC
  Administered 2017-11-28: 5 mg via INTRAVENOUS
  Filled 2017-11-28: qty 2

## 2017-11-28 MED ORDER — HYDROMORPHONE HCL 1 MG/ML IJ SOLN: 1.0000 mg | INTRAMUSCULAR | Status: AC

## 2017-11-28 MED ORDER — HYDROMORPHONE HCL 1 MG/ML IJ SOLN
INTRAMUSCULAR | Status: AC
  Filled 2017-11-28: qty 1

## 2017-11-28 MED ORDER — SODIUM CHLORIDE 0.9 % IV SOLN
Freq: Once | INTRAVENOUS | Status: AC
  Administered 2017-11-28: 22:00:00 via INTRAVENOUS

## 2017-11-28 MED ORDER — METOCLOPRAMIDE HCL 5 MG/ML IJ SOLN
10.0000 mg | Freq: Once | INTRAMUSCULAR | Status: AC
  Administered 2017-11-28: 10 mg via INTRAMUSCULAR
  Filled 2017-11-28: qty 2

## 2017-11-28 NOTE — H&P (Signed)
[facility] -  at [facility]   PATIENT NAME: Daisy [name]    MR#:  [id number]  DATE OF BIRTH:  [birth year]  DATE OF ADMISSION:  11/28/2017  PRIMARY CARE PHYSICIAN: Malva Limes, MD   REQUESTING/REFERRING PHYSICIAN: Lenard Lance, MD  CHIEF COMPLAINT:   Chief Complaint  Patient presents with  . Nausea    HISTORY OF PRESENT ILLNESS:  Daisy [name]  is a 52 y.o. female who presents with nominal pain and nausea and vomiting.  Patient has history of gastroparesis and states that this feels the same.  Initial work-up here in the ED is largely consistent with the same.  Hospitalist were called for admission  PAST MEDICAL HISTORY:   Past Medical History:  Diagnosis Date  . Diabetes mellitus without complication (HCC)   . Gastritis   . Gastroparesis   . Hernia of abdominal wall   . Hypertension   . Migraine      PAST SURGICAL HISTORY:   Past Surgical History:  Procedure Laterality Date  . ABDOMINAL HYSTERECTOMY    . APPENDECTOMY    . APPLICATION OF WOUND VAC    . CHOLECYSTECTOMY    . HERNIA REPAIR    . KNEE SURGERY       SOCIAL HISTORY:   Social History   Tobacco Use  . Smoking status: Never Smoker  . Smokeless tobacco: Former Neurosurgeon    Types: Snuff  Substance Use Topics  . Alcohol use: Yes    Alcohol/week: 0.0 oz    Comment: only on special occasions- wine     FAMILY HISTORY:   Family History  Problem Relation Age of Onset  . Arthritis Mother   . Depression Mother   . Diabetes Mother   . Heart disease Mother   . Hyperlipidemia Mother   . Hypertension Mother   . Stroke Mother   . Arthritis Father   . Diabetes Father   . Hearing loss Father   . Heart disease Father   . Hyperlipidemia Father   . Hypertension Father      DRUG ALLERGIES:   Allergies  Allergen Reactions  . Sulfasalazine Hives  . Acetaminophen-Codeine Hives  . Atorvastatin Other (See Comments)    Muscle pain  . Codeine Hives  . Ibuprofen  Nausea And Vomiting  . Morphine Nausea And Vomiting  . Oxycodone Hives  . Propoxyphene Other (See Comments)    Reaction:  GI upset   . Sulfa Antibiotics Hives  . Duloxetine Rash and Other (See Comments)    Yellowing of the skin    MEDICATIONS AT HOME:   Prior to Admission medications   Medication Sig Start Date End Date Taking? Authorizing Provider  alprazolam Prudy Feeler) 2 MG tablet Take 2 mg by mouth 3 (three) times daily as needed for anxiety.    Yes [provider]  cholecalciferol (VITAMIN D) 1000 units tablet Take 1,000 Units by mouth daily.   Yes [provider]  cloNIDine (CATAPRES - DOSED IN MG/24 HR) 0.1 mg/24hr patch PLACE 1 PATCH (0.1 MG TOTAL) ONTO THE SKIN ONCE A WEEK. 11/30/16  Yes Malva Limes, MD  FLUoxetine (PROZAC) 20 MG capsule Take 1 capsule by mouth daily.   Yes [provider]  furosemide (LASIX) 20 MG tablet Take 1 tablet (20 mg total) by mouth daily as needed for edema. 07/11/17  Yes Malva Limes, MD  lamoTRIgine (LAMICTAL) 100 MG tablet Take 1 tablet by mouth daily.   Yes [provider]  lisinopril (  PRINIVIL,ZESTRIL) 20 MG tablet Take 1 tablet (20 mg total) by mouth daily. 11/04/17  Yes Malva Limes, MD  loratadine (CLARITIN) 10 MG tablet Take 1 tablet (10 mg total) by mouth daily. Patient taking differently: Take 10 mg by mouth daily as needed for allergies.  03/18/17  Yes Enedina Finner, MD  metFORMIN (GLUCOPHAGE) 500 MG tablet Take 1 tablet (500 mg total) by mouth 2 (two) times daily with a meal. 04/15/17  Yes Malva Limes, MD  Oxycodone HCl 10 MG TABS Take 1 tablet by mouth every 6 (six) hours as needed (pain).  10/24/17  Yes [provider]  pantoprazole (PROTONIX) 40 MG tablet Take 1 tablet (40 mg total) by mouth 2 (two) times daily. 11/10/16  Yes Malva Limes, MD  ranitidine (ZANTAC) 150 MG tablet Take 1 tablet (150 mg total) by mouth at bedtime. 10/11/17  Yes Malva Limes, MD  rosuvastatin (CRESTOR) 10  MG tablet Take 1 tablet (10 mg total) by mouth daily. 10/11/17  Yes Malva Limes, MD  tiZANidine (ZANAFLEX) 4 MG tablet Take 4 mg by mouth 3 (three) times daily. 1-3 tablets per day as tolerated per Dr. Metta Clines   Yes [provider]  traZODone (DESYREL) 100 MG tablet Take 100 mg by mouth at bedtime. 10/12/16  Yes [provider]  vitamin B-12 (CYANOCOBALAMIN) 1000 MCG tablet Take 1,000 mcg by mouth daily.   Yes [provider]  zolpidem (AMBIEN) 10 MG tablet Take 1 tablet by mouth at bedtime as needed. 03/23/17  Yes [provider]  albuterol (PROVENTIL HFA;VENTOLIN HFA) 108 (90 Base) MCG/ACT inhaler Inhale 2 puffs into the lungs every 6 (six) hours as needed for wheezing or shortness of breath.    [provider]  cloNIDine (CATAPRES-TTS-1) 0.1 mg/24hr patch Place 1 patch (0.1 mg total) onto the skin once a week. 06/03/17   Malva Limes, MD  montelukast (SINGULAIR) 10 MG tablet TAKE 1 TABLET BY MOUTH EVERYDAY AT BEDTIME 10/10/17   Malva Limes, MD  promethazine (PHENERGAN) 12.5 MG tablet Take 1 tablet (12.5 mg total) by mouth every 6 (six) hours as needed for refractory nausea / vomiting. 10/11/17   Malva Limes, MD  protein supplement shake (PREMIER PROTEIN) LIQD Take 325 mLs (11 oz total) by mouth 3 (three) times daily between meals. 03/18/17   Enedina Finner, MD    REVIEW OF SYSTEMS:  Review of Systems  Constitutional: Negative for chills, fever, malaise/fatigue and weight loss.  HENT: Negative for ear pain, hearing loss and tinnitus.   Eyes: Negative for blurred vision, double vision, pain and redness.  Respiratory: Negative for cough, hemoptysis and shortness of breath.   Cardiovascular: Negative for chest pain, palpitations, orthopnea and leg swelling.  Gastrointestinal: Positive for abdominal pain, nausea and vomiting. Negative for constipation and diarrhea.  Genitourinary: Negative for dysuria, frequency and hematuria.  Musculoskeletal:  Negative for back pain, joint pain and neck pain.  Skin:       No acne, rash, or lesions  Neurological: Negative for dizziness, tremors, focal weakness and weakness.  Endo/Heme/Allergies: Negative for polydipsia. Does not bruise/bleed easily.  Psychiatric/Behavioral: Negative for depression. The patient is not nervous/anxious and does not have insomnia.      VITAL SIGNS:   Vitals:   11/28/17 2045 11/28/17 2130 11/28/17 2200 11/28/17 2215  BP:  (!) 183/121 (!) 187/103   Pulse: 92 93 80 (!) 102  Resp: (!) 30 (!) 25 13   Temp:  TempSrc:      SpO2: 93% 95% 99% 99%  Weight:      Height:       Wt Readings from Last 3 Encounters:  11/28/17 130.2 kg (287 lb)  11/04/17 129.7 kg (286 lb)  10/11/17 130.2 kg (287 lb)    PHYSICAL EXAMINATION:  Physical Exam  Vitals reviewed. Constitutional: She is oriented to person, place, and time. She appears well-developed and well-nourished. No distress.  HENT:  Head: Normocephalic and atraumatic.  Mouth/Throat: Oropharynx is clear and moist.  Eyes: Pupils are equal, round, and reactive to light. Conjunctivae and EOM are normal. No scleral icterus.  Neck: Normal range of motion. Neck supple. No JVD present. No thyromegaly present.  Cardiovascular: Normal rate, regular rhythm and intact distal pulses. Exam reveals no gallop and no friction rub.  No murmur heard. Respiratory: Effort normal and breath sounds normal. No respiratory distress. She has no wheezes. She has no rales.  GI: Soft. Bowel sounds are normal. She exhibits no distension. There is tenderness.  Musculoskeletal: Normal range of motion. She exhibits no edema.  No arthritis, no gout  Lymphadenopathy:    She has no cervical adenopathy.  Neurological: She is alert and oriented to person, place, and time. No cranial nerve deficit.  No dysarthria, no aphasia  Skin: Skin is warm and dry. No rash noted. No erythema.  Psychiatric: She has a normal mood and affect. Her behavior is  normal. Judgment and thought content normal.    LABORATORY PANEL:   CBC Recent Labs  Lab 11/28/17 1912  WBC 9.7  HGB 12.4  HCT 37.5  PLT 313   ------------------------------------------------------------------------------------------------------------------  Chemistries  Recent Labs  Lab 11/28/17 1912  NA 138  K 3.4*  CL 100*  CO2 24  GLUCOSE 147*  BUN 10  CREATININE 0.90  CALCIUM 9.4  AST 19  ALT 13*  ALKPHOS 52  BILITOT 0.7   ------------------------------------------------------------------------------------------------------------------  Cardiac Enzymes Recent Labs  Lab 11/28/17 1912  TROPONINI <0.03   ------------------------------------------------------------------------------------------------------------------  RADIOLOGY:  No results found.  EKG:   Orders placed or performed during the hospital encounter of 11/28/17  . ED EKG  . ED EKG    IMPRESSION AND PLAN:  Principal Problem:   Gastroparesis -scheduled Reglan, PRN analgesia and antiemetics in addition to Reglan, n.p.o. for now Active Problems:   DMII (diabetes mellitus, type 2) (HCC) -sliding scale insulin with corresponding glucose checks   Hypertension -continue home meds   Hyperlipidemia -Home dose antilipid   Bipolar 2 disorder (HCC) -Home medications   GERD (gastroesophageal reflux disease) -Home dose PPI  Chart review performed and case discussed with ED provider. Labs, imaging and/or ECG reviewed by provider and discussed with patient/family. Management plans discussed with the patient and/or family.  DVT PROPHYLAXIS: SubQ lovenox  GI PROPHYLAXIS: PPI  ADMISSION STATUS: Observation  CODE STATUS: Full Code Status History    Date Active Date Inactive Code Status Order ID Comments User Context   10/02/2017 1226 10/03/2017 1959 Full Code [id number]  Shaune Pollackhen, Qing, MD Inpatient   03/16/2017 0454 03/18/2017 1637 Full Code [id number]  Ihor AustinPyreddy, Pavan, MD Inpatient   01/03/2017 0824  01/05/2017 1557 Full Code [id number]  Ihor AustinPyreddy, Pavan, MD Inpatient   03/23/2016 0629 03/26/2016 1651 Full Code [id number]  Arnaldo Nataliamond, Michael S, MD Inpatient   09/28/2015 0454 09/29/2015 1720 Full Code [id number]  Ihor AustinPyreddy, Pavan, MD ED   05/05/2015 2036 05/09/2015 2006 Full Code [id number]  Hower, Cletis Athensavid K, MD ED   01/07/2015 1911 01/09/2015 1855  Full Code [id number]  Milagros Loll, MD ED      TOTAL TIME TAKING CARE OF THIS PATIENT: 40 minutes.   Melayah Skorupski FIELDING 11/28/2017, 11:36 PM  [facility]  Office  [phone]  CC: Primary care physician; Malva Limes, MD  Note:  This document was prepared using Dragon voice recognition software and may include unintentional dictation errors.

## 2017-11-28 NOTE — ED Notes (Signed)
Pt is resting in bed, eyes closed and lying on Left side. Where BP cuff is at. Medication is running at this time.

## 2017-11-28 NOTE — ED Triage Notes (Signed)
Pt presents today via [facility] from home for N/V pt denies diarrhea.

## 2017-11-28 NOTE — ED Notes (Signed)
EDP made aware of pt BP being 205/100, Awaiting orders at this time.

## 2017-11-28 NOTE — ED Provider Notes (Signed)
Angiocath insertion Performed by: Merrily BrittleNeil Rashelle Ireland  Consent: Verbal consent obtained. Risks and benefits: risks, benefits and alternatives were discussed Time out: Immediately prior to procedure a "time out" was called to verify the correct patient, procedure, equipment, support staff and site/side marked as required.  Preparation: Patient was prepped and draped in the usual sterile fashion.  Vein Location: right AC  Ultrasound Guided  Gauge: 18  Normal blood return and flush without difficulty Patient tolerance: Patient tolerated the procedure well with no immediate complications.      Merrily Brittleifenbark, Zarin Knupp, MD 11/28/17 2010

## 2017-11-28 NOTE — ED Provider Notes (Signed)
-----------------------------------------   10:02 PM on 11/28/2017 -----------------------------------------  Patient care assumed from Dr. Marisa SeverinSiadecki.  Patient's work-up is largely nonrevealing.  Patient has a history of gastroparesis in the past which she states this feels identical.  Continues to be nauseated spitting into a bag.  We will dose additional nausea medication.  Patient already states she does not want to go home, states if she goes home she will just come right back because of the nausea vomiting.  We will continue with IV fluids.  Will admit to the hospitalist service for further treatment.  Patient agreeable to plan of care.   Minna AntisPaduchowski, Aryel Edelen, MD 11/28/17 2203

## 2017-11-28 NOTE — ED Provider Notes (Signed)
[facility] Emergency Department Provider Note ____________________________________________   First MD Initiated Contact with Patient 11/28/17 1914     (approximate)  I have reviewed the triage vital signs and the nursing notes.   HISTORY  Chief Complaint Nausea    HPI Daisy [name] is a 52 y.o. female with PMH as noted below who presents with nausea and vomiting, acute onset this morning, persistent course throughout the day, and associated with upper abdominal and chest pain.  Patient states it feels somewhat similar to her prior episodes of gastroparesis.  She denies fever.  She reports that she had a normal bowel movement this morning.  Past Medical History:  Diagnosis Date  . Diabetes mellitus without complication (HCC)   . Gastritis   . Gastroparesis   . Hernia of abdominal wall   . Hypertension   . Migraine     Patient Active Problem List   Diagnosis Date Noted  . Allergic rhinitis 04/18/2017  . Gastroparesis 01/03/2017  . Hyperlipidemia 06/18/2016  . Controlled type 2 diabetes mellitus without complication, without long-term current use of insulin (HCC) 06/18/2016  . Bipolar 2 disorder (HCC) 06/18/2016  . Depression 06/18/2016  . GERD (gastroesophageal reflux disease) 06/18/2016  . Hypertension 06/18/2016  . Ingrown toenail 06/18/2016  . Hypokalemia 09/28/2015  . Chronic abdominal pain 05/23/2015  . Complex ovarian cyst 05/23/2015  . Anxiety 03/27/2015  . Insomnia 03/27/2015  . Gastritis 01/07/2015  . DDD (degenerative disc disease), lumbar 11/28/2014  . DJD (degenerative joint disease) of knee 11/28/2014  . Migraine headache 11/28/2014  . Bilateral occipital neuralgia 11/28/2014  . Morbid obesity (HCC) 11/28/2014  . Neuropathy due to secondary diabetes (HCC) 11/28/2014  . Abdominal wall abscess 09/20/2014  . Back pain, chronic 09/20/2014  . Incisional hernia, without obstruction or gangrene 04/30/2014  . Bilateral chronic  knee pain 03/04/2014    Past Surgical History:  Procedure Laterality Date  . ABDOMINAL HYSTERECTOMY    . APPENDECTOMY    . APPLICATION OF WOUND VAC    . CHOLECYSTECTOMY    . HERNIA REPAIR    . KNEE SURGERY      Prior to Admission medications   Medication Sig Start Date End Date Taking? Authorizing Provider  albuterol (PROVENTIL HFA;VENTOLIN HFA) 108 (90 Base) MCG/ACT inhaler Inhale 2 puffs into the lungs every 6 (six) hours as needed for wheezing or shortness of breath.    [provider]  alprazolam Prudy Feeler) 2 MG tablet Take 2 mg by mouth 3 (three) times daily as needed for anxiety.     [provider]  cholecalciferol (VITAMIN D) 1000 units tablet Take 1,000 Units by mouth daily.    [provider]  cloNIDine (CATAPRES - DOSED IN MG/24 HR) 0.1 mg/24hr patch PLACE 1 PATCH (0.1 MG TOTAL) ONTO THE SKIN ONCE A WEEK. 11/30/16   Malva Limes, MD  cloNIDine (CATAPRES-TTS-1) 0.1 mg/24hr patch Place 1 patch (0.1 mg total) onto the skin once a week. 06/03/17   Malva Limes, MD  fluconazole (DIFLUCAN) 150 MG tablet  11/04/17   [provider]  FLUoxetine (PROZAC) 20 MG capsule Take 1 capsule by mouth daily.    [provider]  furosemide (LASIX) 20 MG tablet Take 1 tablet (20 mg total) by mouth daily as needed for edema. 07/11/17   Malva Limes, MD  lamoTRIgine (LAMICTAL) 100 MG tablet Take 1 tablet by mouth daily.    [provider]  lisinopril (PRINIVIL,ZESTRIL) 20 MG tablet Take  1 tablet (20 mg total) by mouth daily. 11/04/17   Malva Limes, MD  loratadine (CLARITIN) 10 MG tablet Take 1 tablet (10 mg total) by mouth daily. 03/18/17   Enedina Finner, MD  metFORMIN (GLUCOPHAGE) 500 MG tablet Take 1 tablet (500 mg total) by mouth 2 (two) times daily with a meal. 04/15/17   Malva Limes, MD  montelukast (SINGULAIR) 10 MG tablet TAKE 1 TABLET BY MOUTH EVERYDAY AT BEDTIME 10/10/17   Malva Limes, MD  Oxycodone HCl 10 MG TABS Take 1  tablet by mouth every 6 (six) hours as needed. 10/24/17   [provider]  pantoprazole (PROTONIX) 40 MG tablet Take 1 tablet (40 mg total) by mouth 2 (two) times daily. 11/10/16   Malva Limes, MD  promethazine (PHENERGAN) 12.5 MG tablet Take 1 tablet (12.5 mg total) by mouth every 6 (six) hours as needed for refractory nausea / vomiting. 10/11/17   Malva Limes, MD  protein supplement shake (PREMIER PROTEIN) LIQD Take 325 mLs (11 oz total) by mouth 3 (three) times daily between meals. 03/18/17   Enedina Finner, MD  ranitidine (ZANTAC) 150 MG tablet Take 1 tablet (150 mg total) by mouth at bedtime. 10/11/17   Malva Limes, MD  rosuvastatin (CRESTOR) 10 MG tablet Take 1 tablet (10 mg total) by mouth daily. 10/11/17   Malva Limes, MD  tiZANidine (ZANAFLEX) 4 MG tablet Take 4 mg by mouth 3 (three) times daily. 1-3 tablets per day as tolerated per Dr. Metta Clines    [provider]  traZODone (DESYREL) 100 MG tablet Take 100 mg by mouth at bedtime. 10/12/16   [provider]  vitamin B-12 (CYANOCOBALAMIN) 1000 MCG tablet Take 1,000 mcg by mouth daily.    [provider]  zolpidem (AMBIEN) 10 MG tablet Take 1 tablet by mouth at bedtime as needed. 03/23/17   [provider]    Allergies Sulfasalazine; Acetaminophen-codeine; Atorvastatin; Codeine; Ibuprofen; Morphine; Oxycodone; Propoxyphene; Sulfa antibiotics; and Duloxetine  Family History  Problem Relation Age of Onset  . Arthritis Mother   . Depression Mother   . Diabetes Mother   . Heart disease Mother   . Hyperlipidemia Mother   . Hypertension Mother   . Stroke Mother   . Arthritis Father   . Diabetes Father   . Hearing loss Father   . Heart disease Father   . Hyperlipidemia Father   . Hypertension Father     Social History Social History   Tobacco Use  . Smoking status: Never Smoker  . Smokeless tobacco: Former Neurosurgeon    Types: Snuff  Substance Use Topics  . Alcohol use: Yes     Alcohol/week: 0.0 oz    Comment: only on special occasions- wine  . Drug use: No    Review of Systems  Constitutional: No fever. Eyes: No redness. ENT: No sore throat. Cardiovascular: Positive for chest pain. Respiratory: Denies shortness of breath. Gastrointestinal: Positive for nausea and vomiting.  Genitourinary: Negative for dysuria.  Musculoskeletal: Negative for back pain. Skin: Negative for rash. Neurological: Negative for headache.   ____________________________________________   PHYSICAL EXAM:  VITAL SIGNS: ED Triage Vitals  Enc Vitals Group     BP --      Pulse Rate 11/28/17 1908 74     Resp 11/28/17 1908 19     Temp 11/28/17 1908 98.8 F (37.1 C)     Temp Source 11/28/17 1908 Oral     SpO2 11/28/17 1908 99 %  Weight 11/28/17 1910 287 lb (130.2 kg)     Height 11/28/17 1910 5\' 7"  (1.702 m)     Head Circumference --      Peak Flow --      Pain Score 11/28/17 1910 9     Pain Loc --      Pain Edu? --      Excl. in GC? --     Constitutional: Alert and oriented.  +2 uncomfortable appearing but in no acute distress.   Eyes: Conjunctivae are normal.  No scleral icterus. Head: Atraumatic. Nose: No congestion/rhinnorhea. Mouth/Throat: Mucous membranes are slightly dry.   Neck: Normal range of motion.  Cardiovascular: Normal rate, regular rhythm. Grossly normal heart sounds.  Good peripheral circulation. Respiratory: Normal respiratory effort.  No retractions. Lungs CTAB. Gastrointestinal: Soft with mild epigastric tenderness. No distention.  Genitourinary: No flank tenderness. Musculoskeletal: No lower extremity edema.  Extremities warm and well perfused.  Neurologic:  Normal speech and language. No gross focal neurologic deficits are appreciated.  Skin:  Skin is warm and dry. No rash noted. Psychiatric: Mood and affect are normal. Speech and behavior are normal.  ____________________________________________   LABS (all labs ordered are listed, but  only abnormal results are displayed)  Labs Reviewed  CBC WITH DIFFERENTIAL/PLATELET - Abnormal; Notable for the following components:      Result Value   RDW 14.7 (*)    Neutro Abs 7.4 (*)    All other components within normal limits  COMPREHENSIVE METABOLIC PANEL  LIPASE, BLOOD  TROPONIN I   ____________________________________________  EKG   ____________________________________________  RADIOLOGY    ____________________________________________   PROCEDURES  Procedure(s) performed: No  Procedures  Critical Care performed: No ____________________________________________   INITIAL IMPRESSION / ASSESSMENT AND PLAN / ED COURSE  Pertinent labs & imaging results that were available during my care of the patient were reviewed by me and considered in my medical decision making (see chart for details).  52 year old female with PMH as noted above including history of diabetes, gastritis, gastroparesis presents with epigastric abdominal pain radiating to her chest as well as nausea and vomiting over the course the day today, feeling similar to prior episodes of gastroparesis.  I reviewed the past medical records in Epic; patient was most recently seen in the ED and admitted in April of this year for gastroparesis and similar symptoms to today's presentation.  On exam, the patient does have some mild epigastric tenderness but the remainder of the abdomen is soft and nontender.  The exam is otherwise as described above.  Vital signs are normal.  Differential includes gastroparesis, gastritis, acute gastroenteritis, or less likely pancreatitis or other hepatobiliary cause.  No significant distention or other evidence to suggest SBO.  We will treat symptomatically with Reglan, Dilaudid, give IV fluids, obtain labs, and reassess.  ----------------------------------------- 8:38 PM on 11/28/2017 -----------------------------------------  Lab work-up is pending.  Plan will be to  reassess after labs and medication.  If patient has persistent symptoms she may require admission.  No indication for imaging at this time, however if she has significant lab abnormalities or develops worsening abdominal pain or tenderness this may be reassessed.  I am signing the patient out to the oncoming ED physician Dr. Lenard LancePaduchowski.  ____________________________________________   FINAL CLINICAL IMPRESSION(S) / ED DIAGNOSES  Final diagnoses:  Non-intractable vomiting with nausea, unspecified vomiting type  Epigastric pain      NEW MEDICATIONS STARTED DURING THIS VISIT:  New Prescriptions   No medications on file  Note:  This document was prepared using Dragon voice recognition software and may include unintentional dictation errors.    Dionne Bucy, MD 11/28/17 2039

## 2017-11-28 NOTE — ED Notes (Signed)
Verbal order received fromEDP for 1mg  dilaudid an 10 of Reglan

## 2017-11-29 ENCOUNTER — Other Ambulatory Visit: Payer: Self-pay

## 2017-11-29 DIAGNOSIS — E119 Type 2 diabetes mellitus without complications: Secondary | ICD-10-CM | POA: Diagnosis not present

## 2017-11-29 DIAGNOSIS — E1143 Type 2 diabetes mellitus with diabetic autonomic (poly)neuropathy: Secondary | ICD-10-CM | POA: Diagnosis not present

## 2017-11-29 DIAGNOSIS — E785 Hyperlipidemia, unspecified: Secondary | ICD-10-CM | POA: Diagnosis not present

## 2017-11-29 DIAGNOSIS — I10 Essential (primary) hypertension: Secondary | ICD-10-CM | POA: Diagnosis not present

## 2017-11-29 LAB — CBC
HCT: 39.2 % (ref 35.0–47.0)
Hemoglobin: 13 g/dL (ref 12.0–16.0)
MCH: 29 pg (ref 26.0–34.0)
MCHC: 33.2 g/dL (ref 32.0–36.0)
MCV: 87.4 fL (ref 80.0–100.0)
PLATELETS: 333 10*3/uL (ref 150–440)
RBC: 4.49 MIL/uL (ref 3.80–5.20)
RDW: 14.7 % — ABNORMAL HIGH (ref 11.5–14.5)
WBC: 12.9 10*3/uL — ABNORMAL HIGH (ref 3.6–11.0)

## 2017-11-29 LAB — BASIC METABOLIC PANEL
Anion gap: 16 — ABNORMAL HIGH (ref 5–15)
BUN: 7 mg/dL (ref 6–20)
CHLORIDE: 97 mmol/L — AB (ref 101–111)
CO2: 24 mmol/L (ref 22–32)
CREATININE: 0.61 mg/dL (ref 0.44–1.00)
Calcium: 9.5 mg/dL (ref 8.9–10.3)
GFR calc Af Amer: >60 mL/min (ref >60)
GFR calc non Af Amer: >60 mL/min (ref >60)
GLUCOSE: 156 mg/dL — AB (ref 65–99)
Potassium: 3.4 mmol/L — ABNORMAL LOW (ref 3.5–5.1)
Sodium: 137 mmol/L (ref 135–145)

## 2017-11-29 LAB — GLUCOSE, CAPILLARY
GLUCOSE-CAPILLARY: 142 mg/dL — AB (ref 65–99)
Glucose-Capillary: 150 mg/dL — ABNORMAL HIGH (ref 65–99)
Glucose-Capillary: 150 mg/dL — ABNORMAL HIGH (ref 65–99)
Glucose-Capillary: 150 mg/dL — ABNORMAL HIGH (ref 65–99)

## 2017-11-29 MED ORDER — ONDANSETRON HCL 4 MG PO TABS: 4.0000 mg | ORAL_TABLET | Freq: Four times a day (QID) | ORAL | Status: DC | PRN

## 2017-11-29 MED ORDER — ONDANSETRON HCL 4 MG/2ML IJ SOLN
INTRAMUSCULAR | Status: AC
  Filled 2017-11-29: qty 2

## 2017-11-29 MED ORDER — LABETALOL HCL 5 MG/ML IV SOLN
10.0000 mg | INTRAVENOUS | Status: DC | PRN
  Administered 2017-11-29 (×4): 10 mg via INTRAVENOUS
  Filled 2017-11-29 (×4): qty 4

## 2017-11-29 MED ORDER — MORPHINE SULFATE (PF) 2 MG/ML IV SOLN
2.0000 mg | Freq: Once | INTRAVENOUS | Status: DC
Start: 2017-11-29 — End: 2017-11-29
  Filled 2017-11-29: qty 1

## 2017-11-29 MED ORDER — METOCLOPRAMIDE HCL 5 MG/ML IJ SOLN
10.0000 mg | Freq: Three times a day (TID) | INTRAMUSCULAR | Status: DC
  Administered 2017-11-29 – 2017-11-30 (×5): 10 mg via INTRAVENOUS
  Filled 2017-11-29 (×5): qty 2

## 2017-11-29 MED ORDER — CLONIDINE HCL 0.1 MG/24HR TD PTWK
0.1000 mg | MEDICATED_PATCH | TRANSDERMAL | Status: DC
  Administered 2017-11-29: 0.1 mg via TRANSDERMAL
  Filled 2017-11-29: qty 1

## 2017-11-29 MED ORDER — TRAZODONE HCL 100 MG PO TABS
100.0000 mg | ORAL_TABLET | Freq: Every day | ORAL | Status: DC
  Administered 2017-11-29 (×2): 100 mg via ORAL
  Filled 2017-11-29 (×2): qty 1

## 2017-11-29 MED ORDER — HYDRALAZINE HCL 25 MG PO TABS: 25.0000 mg | ORAL_TABLET | Freq: Three times a day (TID) | ORAL | Status: DC

## 2017-11-29 MED ORDER — LAMOTRIGINE 25 MG PO TABS
100.0000 mg | ORAL_TABLET | Freq: Every day | ORAL | Status: DC
  Administered 2017-11-29 – 2017-11-30 (×2): 100 mg via ORAL
  Filled 2017-11-29 (×2): qty 4

## 2017-11-29 MED ORDER — PROMETHAZINE HCL 25 MG/ML IJ SOLN
12.5000 mg | Freq: Four times a day (QID) | INTRAMUSCULAR | Status: DC | PRN
  Administered 2017-11-30: 25 mg via INTRAVENOUS
  Filled 2017-11-29: qty 1

## 2017-11-29 MED ORDER — MONTELUKAST SODIUM 10 MG PO TABS
10.0000 mg | ORAL_TABLET | Freq: Every day | ORAL | Status: DC
  Administered 2017-11-29 (×2): 10 mg via ORAL
  Filled 2017-11-29 (×2): qty 1

## 2017-11-29 MED ORDER — ENOXAPARIN SODIUM 40 MG/0.4ML ~~LOC~~ SOLN
40.0000 mg | Freq: Two times a day (BID) | SUBCUTANEOUS | Status: DC
  Administered 2017-11-29 – 2017-11-30 (×4): 40 mg via SUBCUTANEOUS
  Filled 2017-11-29 (×4): qty 0.4

## 2017-11-29 MED ORDER — ALPRAZOLAM 1 MG PO TABS
2.0000 mg | ORAL_TABLET | Freq: Three times a day (TID) | ORAL | Status: DC | PRN
  Administered 2017-11-29 – 2017-11-30 (×4): 2 mg via ORAL
  Filled 2017-11-29 (×4): qty 2

## 2017-11-29 MED ORDER — PANTOPRAZOLE SODIUM 40 MG PO TBEC
40.0000 mg | DELAYED_RELEASE_TABLET | Freq: Two times a day (BID) | ORAL | Status: DC
  Administered 2017-11-29 – 2017-11-30 (×4): 40 mg via ORAL
  Filled 2017-11-29 (×4): qty 1

## 2017-11-29 MED ORDER — LISINOPRIL 20 MG PO TABS
20.0000 mg | ORAL_TABLET | Freq: Every day | ORAL | Status: DC
  Administered 2017-11-29 – 2017-11-30 (×2): 20 mg via ORAL
  Filled 2017-11-29 (×2): qty 1

## 2017-11-29 MED ORDER — POTASSIUM CHLORIDE CRYS ER 20 MEQ PO TBCR
40.0000 meq | EXTENDED_RELEASE_TABLET | Freq: Once | ORAL | Status: AC
  Administered 2017-11-29: 40 meq via ORAL
  Filled 2017-11-29: qty 2

## 2017-11-29 MED ORDER — INSULIN ASPART 100 UNIT/ML ~~LOC~~ SOLN
0.0000 [IU] | Freq: Four times a day (QID) | SUBCUTANEOUS | Status: DC
  Administered 2017-11-29 – 2017-11-30 (×7): 1 [IU] via SUBCUTANEOUS
  Filled 2017-11-29 (×7): qty 1

## 2017-11-29 MED ORDER — HYDROMORPHONE HCL 1 MG/ML IJ SOLN
0.2000 mg | Freq: Once | INTRAMUSCULAR | Status: AC
  Administered 2017-11-29: 0.2 mg via INTRAVENOUS
  Filled 2017-11-29: qty 1

## 2017-11-29 MED ORDER — OXYCODONE HCL 5 MG PO TABS
10.0000 mg | ORAL_TABLET | Freq: Four times a day (QID) | ORAL | Status: DC | PRN
  Administered 2017-11-29 – 2017-11-30 (×5): 10 mg via ORAL
  Filled 2017-11-29 (×5): qty 2

## 2017-11-29 MED ORDER — ONDANSETRON HCL 4 MG/2ML IJ SOLN
4.0000 mg | Freq: Four times a day (QID) | INTRAMUSCULAR | Status: DC | PRN
  Administered 2017-11-29 (×2): 4 mg via INTRAVENOUS
  Filled 2017-11-29: qty 2

## 2017-11-29 MED ORDER — FLUOXETINE HCL 20 MG PO CAPS
20.0000 mg | ORAL_CAPSULE | Freq: Every day | ORAL | Status: DC
  Administered 2017-11-29 – 2017-11-30 (×2): 20 mg via ORAL
  Filled 2017-11-29 (×3): qty 1

## 2017-11-29 MED ORDER — SODIUM CHLORIDE 0.9 % IV SOLN
INTRAVENOUS | Status: DC
  Administered 2017-11-29 – 2017-11-30 (×2): via INTRAVENOUS

## 2017-11-29 NOTE — Progress Notes (Signed)
MD informed regarding blood pressure. DC  Hydralazine and order home med clonidine patch 0.1mg  patch/weekly. Hold prn labetalol for now and monitor. Verbal order readback

## 2017-11-29 NOTE — Progress Notes (Signed)
Advanced care plan. Purpose of the Encounter: CODE STATUS Parties in Attendance:Patient Patient's Decision Capacity:Good Subjective/Patient's story: Presented for nausea and vomitngs Objective/Medical story Has gastroparesis Goals of care determination:  Advance care directives and goals of care discussed For now patient wants everything done which includes cardiac resuscitation, intubation and ventilator if need arises CODE STATUS: Full code Time spent discussing advanced care planning: 16 minutes

## 2017-11-29 NOTE — Progress Notes (Signed)
[employer] [facility] [place] [facility]   PATIENT NAME: Daisy [name]    MR#:  [id number]  DATE OF BIRTH:  11-Oct-1965  SUBJECTIVE:  CHIEF COMPLAINT:   Chief Complaint  Patient presents with  . Nausea  Patient seen and evaluated today Has decreased nausea No abdominal pain No fever and chills  REVIEW OF SYSTEMS:    ROS  CONSTITUTIONAL: No documented fever. No fatigue, weakness. No weight gain, no weight loss.  EYES: No blurry or double vision.  ENT: No tinnitus. No postnasal drip. No redness of the oropharynx.  RESPIRATORY: No cough, no wheeze, no hemoptysis. No dyspnea.  CARDIOVASCULAR: No chest pain. No orthopnea. No palpitations. No syncope.  GASTROINTESTINAL: Had nausea, no vomiting or diarrhea. No abdominal pain. No melena or hematochezia.  GENITOURINARY: No dysuria or hematuria.  ENDOCRINE: No polyuria or nocturia. No heat or cold intolerance.  HEMATOLOGY: No anemia. No bruising. No bleeding.  INTEGUMENTARY: No rashes. No lesions.  MUSCULOSKELETAL: No arthritis. No swelling. No gout.  NEUROLOGIC: No numbness, tingling, or ataxia. No seizure-type activity.  PSYCHIATRIC: No anxiety. No insomnia. No ADD.   DRUG ALLERGIES:   Allergies  Allergen Reactions  . Sulfasalazine Hives  . Acetaminophen-Codeine Hives  . Atorvastatin Other (See Comments)    Muscle pain  . Codeine Hives  . Ibuprofen Nausea And Vomiting  . Morphine Nausea And Vomiting  . Oxycodone Hives  . Propoxyphene Other (See Comments)    Reaction:  GI upset   . Sulfa Antibiotics Hives  . Duloxetine Rash and Other (See Comments)    Yellowing of the skin    VITALS:  Blood pressure (!) 165/97, pulse 93, temperature 98.4 F (36.9 C), temperature source Oral, resp. rate 15, height 5\' 7"  (1.702 m), weight 130.2 kg (287 lb), SpO2 97 %.  PHYSICAL EXAMINATION:   Physical Exam  GENERAL:  52 y.o.-year-old patient lying in the bed with no acute distress.  EYES: Pupils equal, round, reactive to  light and accommodation. No scleral icterus. Extraocular muscles intact.  HEENT: Head atraumatic, normocephalic. Oropharynx and nasopharynx clear.  NECK:  Supple, no jugular venous distention. No thyroid enlargement, no tenderness.  LUNGS: Normal breath sounds bilaterally, no wheezing, rales, rhonchi. No use of accessory muscles of respiration.  CARDIOVASCULAR: S1, S2 normal. No murmurs, rubs, or gallops.  ABDOMEN: Soft, nontender, nondistended. Bowel sounds present. No organomegaly or mass.  EXTREMITIES: No cyanosis, clubbing or edema b/l.    NEUROLOGIC: Cranial nerves II through XII are intact. No focal Motor or sensory deficits b/l.   PSYCHIATRIC: The patient is alert and oriented x 3.  SKIN: No obvious rash, lesion, or ulcer.   LABORATORY PANEL:   CBC Recent Labs  Lab 11/29/17 0420  WBC 12.9*  HGB 13.0  HCT 39.2  PLT 333   ------------------------------------------------------------------------------------------------------------------ Chemistries  Recent Labs  Lab 11/28/17 1912 11/29/17 0420  NA 138 137  K 3.4* 3.4*  CL 100* 97*  CO2 24 24  GLUCOSE 147* 156*  BUN 10 7  CREATININE 0.90 0.61  CALCIUM 9.4 9.5  AST 19  --   ALT 13*  --   ALKPHOS 52  --   BILITOT 0.7  --    ------------------------------------------------------------------------------------------------------------------  Cardiac Enzymes Recent Labs  Lab 11/28/17 1912  TROPONINI <0.03   ------------------------------------------------------------------------------------------------------------------  RADIOLOGY:  No results found.   ASSESSMENT AND PLAN:   52 year old female patient with history of diabetes mellitus type 2, gastroparesis, hernia of abdominal wall, hypertension gently under hospitalist service for  nausea and vomiting  -Diabetic gastroparesis Continue scheduled Reglan  advance diet as tolerated IV fluids  -Acute hypokalemia Replace potassium orally follow-up  electrolytes  -Hypertension Clonidine patch resumed  -Bipolar disorder Continue fluoxetine and trazodone  -DVT prophylaxis with [place] lovenox  All the records are reviewed and case discussed with Care Management/Social Worker. Management plans discussed with the patient, family and they are in agreement.  CODE STATUS: Full code  DVT Prophylaxis: SCDs  TOTAL TIME TAKING CARE OF THIS PATIENT: 35 minutes.   POSSIBLE D/C IN 1 DAYS, DEPENDING ON CLINICAL CONDITION.  Ihor Austin M.D on 11/29/2017 at 3:34 PM  Between 7am to 6pm - Pager - [phone]  After 6pm go to [email or web address] - password [email or web address] [facility]  [facility]  Office  [phone]  CC: Primary care physician; Malva Limes, MD  Note: This dictation was prepared with Dragon dictation along with smaller phrase technology. Any transcriptional errors that result from this process are unintentional.

## 2017-11-29 NOTE — Progress Notes (Addendum)
Paged attending and primedoc for uncontrolled BP and pain. Did not tolerate diet change, please consider clear or full.   Awaiting response

## 2017-11-30 ENCOUNTER — Telehealth: Payer: Self-pay | Admitting: Family Medicine

## 2017-11-30 DIAGNOSIS — E119 Type 2 diabetes mellitus without complications: Secondary | ICD-10-CM | POA: Diagnosis not present

## 2017-11-30 DIAGNOSIS — I10 Essential (primary) hypertension: Secondary | ICD-10-CM | POA: Diagnosis not present

## 2017-11-30 DIAGNOSIS — E1143 Type 2 diabetes mellitus with diabetic autonomic (poly)neuropathy: Secondary | ICD-10-CM | POA: Diagnosis not present

## 2017-11-30 DIAGNOSIS — E785 Hyperlipidemia, unspecified: Secondary | ICD-10-CM | POA: Diagnosis not present

## 2017-11-30 LAB — BASIC METABOLIC PANEL WITH GFR
Anion gap: 10 (ref 5–15)
BUN: 10 mg/dL (ref 6–20)
CO2: 25 mmol/L (ref 22–32)
Calcium: 8.7 mg/dL — ABNORMAL LOW (ref 8.9–10.3)
Chloride: 101 mmol/L (ref 101–111)
Creatinine, Ser: 0.72 mg/dL (ref 0.44–1.00)
GFR calc Af Amer: >60 mL/min
GFR calc non Af Amer: >60 mL/min
Glucose, Bld: 151 mg/dL — ABNORMAL HIGH (ref 65–99)
Potassium: 3.5 mmol/L (ref 3.5–5.1)
Sodium: 136 mmol/L (ref 135–145)

## 2017-11-30 LAB — GLUCOSE, CAPILLARY
Glucose-Capillary: 127 mg/dL — ABNORMAL HIGH (ref 65–99)
Glucose-Capillary: 147 mg/dL — ABNORMAL HIGH (ref 65–99)
Glucose-Capillary: 149 mg/dL — ABNORMAL HIGH (ref 65–99)

## 2017-11-30 LAB — CBC
HCT: 38.1 % (ref 35.0–47.0)
Hemoglobin: 12.7 g/dL (ref 12.0–16.0)
MCH: 29.4 pg (ref 26.0–34.0)
MCHC: 33.5 g/dL (ref 32.0–36.0)
MCV: 87.9 fL (ref 80.0–100.0)
Platelets: 340 K/uL (ref 150–440)
RBC: 4.34 MIL/uL (ref 3.80–5.20)
RDW: 15 % — ABNORMAL HIGH (ref 11.5–14.5)
WBC: 8.5 K/uL (ref 3.6–11.0)

## 2017-11-30 MED ORDER — PROMETHAZINE HCL 12.5 MG PO TABS: 12.5000 mg | ORAL_TABLET | Freq: Four times a day (QID) | ORAL | 3 refills | Status: DC | PRN

## 2017-11-30 MED ORDER — TRAMADOL HCL 50 MG PO TABS: 50.0000 mg | ORAL_TABLET | Freq: Four times a day (QID) | ORAL | 0 refills | Status: DC | PRN

## 2017-11-30 MED ORDER — METOCLOPRAMIDE HCL 10 MG PO TABS: 10.0000 mg | ORAL_TABLET | Freq: Three times a day (TID) | ORAL | 11 refills | Status: DC

## 2017-11-30 MED ORDER — OXYCODONE HCL 10 MG PO TABS: 10.0000 mg | ORAL_TABLET | Freq: Four times a day (QID) | ORAL | 0 refills | Status: AC | PRN

## 2017-11-30 NOTE — Telephone Encounter (Signed)
Pt is being discharged from hospital today and is scheduled for OV on 12/12/17. I scheduled the visit for 20 minutes because the next slot was on hold which would allow longer time with pt. Please advise. Thanks TNP

## 2017-11-30 NOTE — Care Management Note (Signed)
Case Management Note  Patient Details  Name: Juanetta GoslingDanita A Tracey MRN: [id number] Date of Birth: [birth year]  Subjective/Objective:   Patient states she is having difficulty paying her copays. Good RX coupons given for discharge medications. Patient appreciative.                  Action/Plan:   Expected Discharge Date:  11/30/17               Expected Discharge Plan:     In-House Referral:     Discharge planning Services  CM Consult, Medication Assistance  Post Acute Care Choice:    Choice offered to:     DME Arranged:    DME Agency:     HH Arranged:    HH Agency:     Status of Service:     If discussed at [employer] Meetings, dates discussed:    Additional Comments:  Marily MemosLisa M Keano [place], RN 11/30/2017, 11:38 AM

## 2017-11-30 NOTE — Discharge Summary (Signed)
[employer] - [place] at [facility], 52 y.o., DOB 1965/07/15, MRN [id number]. Admission date: 11/28/2017 Discharge Date 11/30/2017 Primary MD Malva Limes, MD Admitting Physician Oralia Manis, MD  Admission Diagnosis  Epigastric pain [R10.13] Non-intractable vomiting with nausea, unspecified vomiting type [R11.2]  Discharge Diagnosis   Principal Problem: Diabetic gastroparesis Acute hypokalemia Hyperlipidemia DMII (diabetes mellitus, type 2) (HCC) Bipolar 2 disorder (HCC) GERD (gastroesophageal reflux disease)           Hospital Course89 year old female patient with history of diabetes mellitus type 2, gastroparesis, hernia of abdominal wall, hypertension gently under hospitalist service for nausea and vomiting.  The patient does have a history of hiatal hernia in the past.  Which she is going to follow-up with surgery to fix.  Patient continued to complain of nausea vomiting she was dehydrated and noted to have hypokalemia.  He was admitted with gastroparesis.  I did see results of the gastric emptying study in 2016 which showed no evidence of gastroparesis.  At this point she was treated in the hospital with antiemetics and IV fluids she is feeling much better tolerating diet.  Stable for discharge she needs outpatient follow-up with surgery.               Consults  None  Significant Tests:  See full reports for all details     No results found.     Today   Subjective:   Daisy [name] still has some nausea but much improved Objective:   Blood pressure (!) 145/87, pulse (!) 104, temperature 99.2 F (37.3 C), temperature source Oral, resp. rate 20, height 5\' 7"  (1.702 m), weight 130.2 kg (287 lb), SpO2 97 %.  .  Intake/Output Summary (Last 24 hours) at 11/30/2017 1218 Last data filed at 11/30/2017 0900 Gross per 24 hour  Intake 2590 ml  Output -  Net 2590 ml    Exam VITAL SIGNS: Blood pressure (!) 145/87, pulse (!)  104, temperature 99.2 F (37.3 C), temperature source Oral, resp. rate 20, height 5\' 7"  (1.702 m), weight 130.2 kg (287 lb), SpO2 97 %.  GENERAL:  52 y.o.-year-old patient lying in the bed with no acute distress.  EYES: Pupils equal, round, reactive to light and accommodation. No scleral icterus. Extraocular muscles intact.  HEENT: Head atraumatic, normocephalic. Oropharynx and nasopharynx clear.  NECK:  Supple, no jugular venous distention. No thyroid enlargement, no tenderness.  LUNGS: Normal breath sounds bilaterally, no wheezing, rales,rhonchi or crepitation. No use of accessory muscles of respiration.  CARDIOVASCULAR: S1, S2 normal. No murmurs, rubs, or gallops.  ABDOMEN: Soft, nontender, nondistended. Bowel sounds present. No organomegaly or mass.  EXTREMITIES: No pedal edema, cyanosis, or clubbing.  NEUROLOGIC: Cranial nerves II through XII are intact. Muscle strength 5/5 in all extremities. Sensation intact. Gait not checked.  PSYCHIATRIC: The patient is alert and oriented x 3.  SKIN: No obvious rash, lesion, or ulcer.   Data Review     CBC w Diff:  Lab Results  Component Value Date   WBC 8.5 11/30/2017   HGB 12.7 11/30/2017   HGB 12.6 04/04/2014   HCT 38.1 11/30/2017   HCT 40.2 04/04/2014   PLT 340 11/30/2017   PLT 411 04/04/2014   LYMPHOPCT 20 11/28/2017   LYMPHOPCT 32.7 04/04/2014   MONOPCT 3 11/28/2017   MONOPCT 4.7 04/04/2014   EOSPCT 0 11/28/2017   EOSPCT 0.2 04/04/2014   BASOPCT 1 11/28/2017   BASOPCT 0.6 04/04/2014   CMP:  Lab  Results  Component Value Date   NA 136 11/30/2017   NA 140 10/11/2017   NA 142 04/04/2014   K 3.5 11/30/2017   K 3.7 04/04/2014   CL 101 11/30/2017   CL 103 04/04/2014   CO2 25 11/30/2017   CO2 29 04/04/2014   BUN 10 11/30/2017   BUN 10 10/11/2017   BUN 7 04/04/2014   CREATININE 0.72 11/30/2017   CREATININE 0.98 04/04/2014   PROT 8.7 (H) 11/28/2017   PROT 7.2 10/11/2017   PROT 8.5 (H) 04/04/2014   ALBUMIN 4.3 11/28/2017    ALBUMIN 4.3 10/11/2017   ALBUMIN 3.7 04/04/2014   BILITOT 0.7 11/28/2017   BILITOT 0.3 10/11/2017   BILITOT 0.4 04/04/2014   ALKPHOS 52 11/28/2017   ALKPHOS 67 04/04/2014   AST 19 11/28/2017   AST 21 04/04/2014   ALT 13 (L) 11/28/2017   ALT 25 04/04/2014  .  Micro Results No results found for this or any previous visit (from the past 240 hour(s)).      Code Status Orders  (From admission, onward)        Start     Ordered   11/29/17 0112  Full code  Continuous     11/29/17 0111    Code Status History    Date Active Date Inactive Code Status Order ID Comments User Context   10/02/2017 1226 10/03/2017 1959 Full Code [id number]  Shaune Pollack, MD Inpatient   03/16/2017 0454 03/18/2017 1637 Full Code [id number]  Ihor Austin, MD Inpatient   01/03/2017 0824 01/05/2017 1557 Full Code [id number]  Ihor Austin, MD Inpatient   03/23/2016 0629 03/26/2016 1651 Full Code [id number]  Arnaldo Natal, MD Inpatient   09/28/2015 0454 09/29/2015 1720 Full Code [id number]  Ihor Austin, MD ED   05/05/2015 2036 05/09/2015 2006 Full Code [id number]  Hower, Cletis Athens, MD ED   01/07/2015 1911 01/09/2015 1855 Full Code [id number]  Milagros Loll, MD ED          Follow-up Information    Malva Limes, MD On 12/12/2017.   Specialty:  Family Medicine Why:  hosp f/u @3 :20pm Contact information: [address] [place] [address] [phone]           Discharge Medications   Allergies as of 11/30/2017      Reactions   Sulfasalazine Hives   Acetaminophen-codeine Hives   Atorvastatin Other (See Comments)   Muscle pain   Codeine Hives   Ibuprofen Nausea And Vomiting   Morphine Nausea And Vomiting   Oxycodone Hives   Propoxyphene Other (See Comments)   Reaction:  GI upset    Sulfa Antibiotics Hives   Duloxetine Rash, Other (See Comments)   Yellowing of the skin      Medication List    STOP taking these medications   Oxycodone HCl 10 MG Tabs     TAKE these  medications   albuterol 108 (90 Base) MCG/ACT inhaler Commonly known as:  PROVENTIL HFA;VENTOLIN HFA Inhale 2 puffs into the lungs every 6 (six) hours as needed for wheezing or shortness of breath.   alprazolam 2 MG tablet Commonly known as:  XANAX Take 2 mg by mouth 3 (three) times daily as needed for anxiety.   cholecalciferol 1000 units tablet Commonly known as:  VITAMIN D Take 1,000 Units by mouth daily.   cloNIDine 0.1 mg/24hr patch Commonly known as:  CATAPRES - Dosed in mg/24 hr PLACE 1 PATCH (0.1 MG TOTAL) ONTO THE SKIN ONCE A WEEK.  cloNIDine 0.1 mg/24hr patch Commonly known as:  CATAPRES-TTS-1 Place 1 patch (0.1 mg total) onto the skin once a week.   FLUoxetine 20 MG capsule Commonly known as:  PROZAC Take 1 capsule by mouth daily.   furosemide 20 MG tablet Commonly known as:  LASIX Take 1 tablet (20 mg total) by mouth daily as needed for edema.   lamoTRIgine 100 MG tablet Commonly known as:  LAMICTAL Take 1 tablet by mouth daily.   lisinopril 20 MG tablet Commonly known as:  PRINIVIL,ZESTRIL Take 1 tablet (20 mg total) by mouth daily.   loratadine 10 MG tablet Commonly known as:  CLARITIN Take 1 tablet (10 mg total) by mouth daily. What changed:    when to take this  reasons to take this   metFORMIN 500 MG tablet Commonly known as:  GLUCOPHAGE Take 1 tablet (500 mg total) by mouth 2 (two) times daily with a meal.   metoCLOPramide 10 MG tablet Commonly known as:  REGLAN Take 1 tablet (10 mg total) by mouth 3 (three) times daily with meals.   montelukast 10 MG tablet Commonly known as:  SINGULAIR TAKE 1 TABLET BY MOUTH EVERYDAY AT BEDTIME   pantoprazole 40 MG tablet Commonly known as:  PROTONIX Take 1 tablet (40 mg total) by mouth 2 (two) times daily.   promethazine 12.5 MG tablet Commonly known as:  PHENERGAN Take 1 tablet (12.5 mg total) by mouth every 6 (six) hours as needed for refractory nausea / vomiting.   protein supplement shake  Liqd Commonly known as:  PREMIER PROTEIN Take 325 mLs (11 oz total) by mouth 3 (three) times daily between meals.   ranitidine 150 MG tablet Commonly known as:  ZANTAC Take 1 tablet (150 mg total) by mouth at bedtime.   rosuvastatin 10 MG tablet Commonly known as:  CRESTOR Take 1 tablet (10 mg total) by mouth daily.   tiZANidine 4 MG tablet Commonly known as:  ZANAFLEX Take 4 mg by mouth 3 (three) times daily. 1-3 tablets per day as tolerated per Dr. Metta Clinesrisp   traMADol 50 MG tablet Commonly known as:  ULTRAM Take 1 tablet (50 mg total) by mouth every 6 (six) hours as needed.   traZODone 100 MG tablet Commonly known as:  DESYREL Take 100 mg by mouth at bedtime.   vitamin B-12 1000 MCG tablet Commonly known as:  CYANOCOBALAMIN Take 1,000 mcg by mouth daily.   zolpidem 10 MG tablet Commonly known as:  AMBIEN Take 1 tablet by mouth at bedtime as needed.          Total Time in preparing paper work, data evaluation and todays exam - 35 minutes  Auburn BilberryShreyang Leniyah Martell M.D on 11/30/2017 at 12:18 PM [employer]   Office  [phone]

## 2017-11-30 NOTE — Care Management Obs Status (Signed)
MEDICARE OBSERVATION STATUS NOTIFICATION   Patient Details  Name: Daisy [name] MRN: [id number] Date of Birth: 1965/06/23   Medicare Observation Status Notification Given:  Yes    Marily MemosLisa M Ty Oshima, RN 11/30/2017, 8:50 AM

## 2017-11-30 NOTE — Progress Notes (Signed)
Pt in no acute distress. VSS. BP slightly elevated, pt is moving around getting ready for discharge. Discharge instructions explained to pt and pt verbalized understanding. Volunteer wheeled pt to visitors entrance and assisted into vehicle.

## 2017-12-01 ENCOUNTER — Telehealth: Payer: Self-pay

## 2017-12-02 ENCOUNTER — Telehealth: Payer: Self-pay

## 2017-12-02 ENCOUNTER — Other Ambulatory Visit: Payer: Self-pay | Admitting: Family Medicine

## 2017-12-02 NOTE — Telephone Encounter (Signed)
Just an FYI Patient called to cancel her hospital f/u.  She said she has a wellness visit a few days later and did not want to come in for the hospital f/u too.

## 2017-12-02 NOTE — Telephone Encounter (Signed)
Pt needs refill on her inhaler albuterol  [facility]   Thanks teri

## 2017-12-03 MED ORDER — ALBUTEROL SULFATE HFA 108 (90 BASE) MCG/ACT IN AERS: 2.0000 | INHALATION_SPRAY | Freq: Four times a day (QID) | RESPIRATORY_TRACT | 3 refills | Status: DC | PRN

## 2017-12-04 ENCOUNTER — Other Ambulatory Visit: Payer: Self-pay | Admitting: Family Medicine

## 2017-12-06 NOTE — Telephone Encounter (Signed)
Tried to contact pt several times and left VMs to CB. Pt did not return calls. TCM not completed. FYI to PCP! -MM

## 2017-12-12 ENCOUNTER — Inpatient Hospital Stay: Payer: Medicare Other | Admitting: Family Medicine

## 2017-12-16 ENCOUNTER — Ambulatory Visit: Payer: Medicare Other

## 2017-12-16 ENCOUNTER — Ambulatory Visit: Payer: Medicare Other | Admitting: Family Medicine

## 2017-12-16 ENCOUNTER — Encounter: Payer: Medicare Other | Admitting: Family Medicine

## 2017-12-16 ENCOUNTER — Telehealth: Payer: Self-pay

## 2017-12-16 NOTE — Telephone Encounter (Signed)
Pt n/s her CPE and AWV today. Called to r/s these apts and pt answered and said [facility] and would call us right back. -MM

## 2017-12-16 NOTE — Telephone Encounter (Signed)
Noted, thank you.  -MM 

## 2017-12-16 NOTE — Telephone Encounter (Signed)
Patient states that she has a lot of appointments coming up and will call back to reschedule.

## 2017-12-19 DIAGNOSIS — M5136 Other intervertebral disc degeneration, lumbar region: Secondary | ICD-10-CM | POA: Diagnosis not present

## 2017-12-19 DIAGNOSIS — Z5181 Encounter for therapeutic drug level monitoring: Secondary | ICD-10-CM | POA: Diagnosis not present

## 2017-12-19 DIAGNOSIS — M5030 Other cervical disc degeneration, unspecified cervical region: Secondary | ICD-10-CM | POA: Diagnosis not present

## 2017-12-19 DIAGNOSIS — M5481 Occipital neuralgia: Secondary | ICD-10-CM | POA: Diagnosis not present

## 2017-12-29 ENCOUNTER — Encounter: Payer: Self-pay | Admitting: Family Medicine

## 2017-12-29 ENCOUNTER — Ambulatory Visit (INDEPENDENT_AMBULATORY_CARE_PROVIDER_SITE_OTHER): Payer: Medicare Other | Admitting: Family Medicine

## 2017-12-29 VITALS — BP 92/68 | HR 77 | Temp 98.6°F | Resp 18 | Wt 287.0 lb

## 2017-12-29 DIAGNOSIS — R05 Cough: Secondary | ICD-10-CM | POA: Diagnosis not present

## 2017-12-29 DIAGNOSIS — J40 Bronchitis, not specified as acute or chronic: Secondary | ICD-10-CM | POA: Diagnosis not present

## 2017-12-29 DIAGNOSIS — R059 Cough, unspecified: Secondary | ICD-10-CM

## 2017-12-29 MED ORDER — AZITHROMYCIN 250 MG PO TABS: ORAL_TABLET | ORAL | 0 refills | Status: AC

## 2017-12-29 MED ORDER — BENZONATATE 200 MG PO CAPS: 200.0000 mg | ORAL_CAPSULE | Freq: Two times a day (BID) | ORAL | 0 refills | Status: AC | PRN

## 2017-12-29 MED ORDER — FLUCONAZOLE 150 MG PO TABS: 150.0000 mg | ORAL_TABLET | Freq: Once | ORAL | 0 refills | Status: AC

## 2017-12-29 MED ORDER — FLUTICASONE PROPIONATE 50 MCG/ACT NA SUSP: 2.0000 | Freq: Every day | NASAL | 6 refills | Status: DC

## 2017-12-29 NOTE — Progress Notes (Signed)
Patient: Daisy [name] Female    DOB: [birth year]   52 y.o.   MRN: [id number] Visit Date: 12/29/2017  Today's Provider: Mila Merry, MD   Chief Complaint  Patient presents with  . Cough    x 1 week   Subjective:    Cough  This is a new problem. Episode onset: 1 week ago. The problem has been waxing and waning. The cough is productive of sputum (with yellow sputum). Associated symptoms include a fever (low grade), myalgias, nasal congestion, postnasal drip, rhinorrhea and shortness of breath. Pertinent negatives include no chest pain, chills, ear congestion, ear pain, headaches, hemoptysis, sore throat or wheezing. The symptoms are aggravated by lying down. Treatments tried: Tylenol Cold and cough medication, also Mucinex. The treatment provided no relief.  Had been out of inhalers, but has prescription at pharmacy.  Complains of pain in her legs the last few months.      Allergies  Allergen Reactions  . Sulfasalazine Hives  . Acetaminophen-Codeine Hives  . Atorvastatin Other (See Comments)    Muscle pain  . Codeine Hives  . Ibuprofen Nausea And Vomiting  . Morphine Nausea And Vomiting  . Oxycodone Hives  . Propoxyphene Other (See Comments)    Reaction:  GI upset   . Sulfa Antibiotics Hives  . Duloxetine Rash and Other (See Comments)    Yellowing of the skin     Current Outpatient Medications:  .  albuterol (PROVENTIL HFA;VENTOLIN HFA) 108 (90 Base) MCG/ACT inhaler, Inhale 2 puffs into the lungs every 6 (six) hours as needed for wheezing or shortness of breath., Disp: 18 g, Rfl: 3 .  alprazolam (XANAX) 2 MG tablet, Take 2 mg by mouth 3 (three) times daily as needed for anxiety. , Disp: , Rfl:  .  cholecalciferol (VITAMIN D) 1000 units tablet, Take 1,000 Units by mouth daily., Disp: , Rfl:  .  cloNIDine (CATAPRES - DOSED IN MG/24 HR) 0.1 mg/24hr patch, PLACE 1 PATCH (0.1 MG TOTAL) ONTO THE SKIN ONCE A WEEK., Disp: 4 patch, Rfl: 6 .  cloNIDine (CATAPRES-TTS-1) 0.1  mg/24hr patch, Place 1 patch (0.1 mg total) onto the skin once a week., Disp: 12 patch, Rfl: 4 .  FLUoxetine (PROZAC) 20 MG capsule, Take 1 capsule by mouth daily., Disp: , Rfl:  .  furosemide (LASIX) 20 MG tablet, Take 1 tablet (20 mg total) by mouth daily as needed for edema., Disp: 90 tablet, Rfl: 4 .  lamoTRIgine (LAMICTAL) 100 MG tablet, Take 1 tablet by mouth daily., Disp: , Rfl:  .  lisinopril (PRINIVIL,ZESTRIL) 20 MG tablet, Take 1 tablet (20 mg total) by mouth daily., Disp: 3 tablet, Rfl: 0 .  loratadine (CLARITIN) 10 MG tablet, Take 1 tablet (10 mg total) by mouth daily. (Patient taking differently: Take 10 mg by mouth daily as needed for allergies. ), Disp: 30 tablet, Rfl: 0 .  metFORMIN (GLUCOPHAGE) 500 MG tablet, Take 1 tablet (500 mg total) by mouth 2 (two) times daily with a meal., Disp: 180 tablet, Rfl: 4 .  metoCLOPramide (REGLAN) 10 MG tablet, Take 1 tablet (10 mg total) by mouth 3 (three) times daily with meals., Disp: 90 tablet, Rfl: 11 .  montelukast (SINGULAIR) 10 MG tablet, TAKE 1 TABLET BY MOUTH EVERYDAY AT BEDTIME, Disp: 90 tablet, Rfl: 4 .  Oxycodone HCl 10 MG TABS, Take 1 tablet (10 mg total) by mouth every 6 (six) hours as needed (pain)., Disp: 40 tablet, Rfl: 0 .  pantoprazole (PROTONIX)  40 MG tablet, TAKE 1 TABLET BY MOUTH TWICE A DAY, Disp: 180 tablet, Rfl: 4 .  promethazine (PHENERGAN) 12.5 MG tablet, Take 1 tablet (12.5 mg total) by mouth every 6 (six) hours as needed for refractory nausea / vomiting., Disp: 30 tablet, Rfl: 3 .  protein supplement shake (PREMIER PROTEIN) LIQD, Take 325 mLs (11 oz total) by mouth 3 (three) times daily between meals., Disp: 30 Can, Rfl: 0 .  ranitidine (ZANTAC) 150 MG tablet, Take 1 tablet (150 mg total) by mouth at bedtime., Disp: 90 tablet, Rfl: 4 .  rosuvastatin (CRESTOR) 10 MG tablet, Take 1 tablet (10 mg total) by mouth daily., Disp: 30 tablet, Rfl: 3 .  tiZANidine (ZANAFLEX) 4 MG tablet, Take 4 mg by mouth 3 (three) times daily.  1-3 tablets per day as tolerated per Dr. Metta Clinesrisp, Disp: , Rfl:  .  traZODone (DESYREL) 100 MG tablet, Take 100 mg by mouth at bedtime., Disp: , Rfl:  .  vitamin B-12 (CYANOCOBALAMIN) 1000 MCG tablet, Take 1,000 mcg by mouth daily., Disp: , Rfl:  .  zolpidem (AMBIEN) 10 MG tablet, Take 1 tablet by mouth at bedtime as needed., Disp: , Rfl:   Review of Systems  Constitutional: Positive for diaphoresis, fatigue and fever (low grade). Negative for appetite change and chills.  HENT: Positive for congestion, postnasal drip, rhinorrhea and voice change. Negative for ear pain and sore throat.   Respiratory: Positive for cough (productive) and shortness of breath. Negative for hemoptysis, chest tightness and wheezing.   Cardiovascular: Negative for chest pain and palpitations.  Gastrointestinal: Negative for abdominal pain, nausea and vomiting.  Musculoskeletal: Positive for myalgias.  Neurological: Negative for dizziness, weakness and headaches.    Social History   Tobacco Use  . Smoking status: Never Smoker  . Smokeless tobacco: Former NeurosurgeonUser    Types: Snuff  Substance Use Topics  . Alcohol use: Yes    Alcohol/week: 0.0 oz    Comment: only on special occasions- wine   Objective:   BP 98/60 (BP Location: Left Wrist, Patient Position: Sitting, Cuff Size: Large)   Pulse 77   Temp 98.6 F (37 C) (Oral)   Resp 18   Wt 287 lb (130.2 kg)   SpO2 96% Comment: room air  BMI 44.95 kg/m   Physical Exam  General Appearance:    Alert, cooperative, no distress  HENT:   bilateral TM normal without fluid or infection, neck without nodes, sinuses nontender and nasal mucosa congested  Eyes:    PERRL, conjunctiva/corneas clear, EOM's intact       Lungs:     Clear to auscultation bilaterally, respirations unlabored  Heart:    Regular rate and rhythm  Neurologic:   Awake, alert, oriented x 3. No apparent focal neurological           defect.           Assessment & Plan:     1. Cough  - fluticasone  (FLONASE) 50 MCG/ACT nasal spray; Place 2 sprays into both nostrils daily.  Dispense: 16 g; Refill: 6 - benzonatate (TESSALON) 200 MG capsule; Take 1 capsule (200 mg total) by mouth 2 (two) times daily as needed for up to 10 days for cough.  Dispense: 20 capsule; Refill: 0  2. Bronchitis  - azithromycin (ZITHROMAX) 250 MG tablet; 2 by mouth today, then 1 daily for 4 days  Dispense: 6 tablet; Refill: 0 - fluconazole (DIFLUCAN) 150 MG tablet; Take 1 tablet (150 mg total) by mouth once  for 1 dose.  Dispense: 1 tablet; Refill: 0  Call if symptoms change or if not rapidly improving.           Mila Merry, MD  [facility]

## 2017-12-30 ENCOUNTER — Telehealth: Payer: Self-pay

## 2018-01-03 NOTE — Telephone Encounter (Signed)
Scheduled AWV for 01/17/18. -MM

## 2018-01-13 ENCOUNTER — Other Ambulatory Visit: Payer: Self-pay

## 2018-01-13 ENCOUNTER — Encounter: Payer: Self-pay | Admitting: Emergency Medicine

## 2018-01-13 DIAGNOSIS — K3184 Gastroparesis: Secondary | ICD-10-CM | POA: Diagnosis present

## 2018-01-13 DIAGNOSIS — Z6841 Body Mass Index (BMI) 40.0 and over, adult: Secondary | ICD-10-CM

## 2018-01-13 DIAGNOSIS — F419 Anxiety disorder, unspecified: Secondary | ICD-10-CM | POA: Diagnosis present

## 2018-01-13 DIAGNOSIS — E1165 Type 2 diabetes mellitus with hyperglycemia: Secondary | ICD-10-CM | POA: Diagnosis present

## 2018-01-13 DIAGNOSIS — Z833 Family history of diabetes mellitus: Secondary | ICD-10-CM | POA: Diagnosis not present

## 2018-01-13 DIAGNOSIS — R809 Proteinuria, unspecified: Secondary | ICD-10-CM | POA: Diagnosis not present

## 2018-01-13 DIAGNOSIS — I160 Hypertensive urgency: Secondary | ICD-10-CM | POA: Diagnosis present

## 2018-01-13 DIAGNOSIS — Z885 Allergy status to narcotic agent status: Secondary | ICD-10-CM | POA: Diagnosis not present

## 2018-01-13 DIAGNOSIS — E860 Dehydration: Secondary | ICD-10-CM | POA: Diagnosis not present

## 2018-01-13 DIAGNOSIS — Z882 Allergy status to sulfonamides status: Secondary | ICD-10-CM | POA: Diagnosis not present

## 2018-01-13 DIAGNOSIS — E876 Hypokalemia: Secondary | ICD-10-CM | POA: Diagnosis not present

## 2018-01-13 DIAGNOSIS — D72829 Elevated white blood cell count, unspecified: Secondary | ICD-10-CM | POA: Diagnosis present

## 2018-01-13 DIAGNOSIS — E1143 Type 2 diabetes mellitus with diabetic autonomic (poly)neuropathy: Principal | ICD-10-CM | POA: Diagnosis present

## 2018-01-13 DIAGNOSIS — Z888 Allergy status to other drugs, medicaments and biological substances status: Secondary | ICD-10-CM | POA: Diagnosis not present

## 2018-01-13 DIAGNOSIS — I10 Essential (primary) hypertension: Secondary | ICD-10-CM | POA: Diagnosis present

## 2018-01-13 DIAGNOSIS — Z79899 Other long term (current) drug therapy: Secondary | ICD-10-CM | POA: Diagnosis not present

## 2018-01-13 DIAGNOSIS — R630 Anorexia: Secondary | ICD-10-CM | POA: Diagnosis present

## 2018-01-13 DIAGNOSIS — R1013 Epigastric pain: Secondary | ICD-10-CM | POA: Diagnosis not present

## 2018-01-13 DIAGNOSIS — R112 Nausea with vomiting, unspecified: Secondary | ICD-10-CM | POA: Diagnosis not present

## 2018-01-13 LAB — CBC
HCT: 37.7 % (ref 35.0–47.0)
HEMOGLOBIN: 13.2 g/dL (ref 12.0–16.0)
MCH: 30.1 pg (ref 26.0–34.0)
MCHC: 35.1 g/dL (ref 32.0–36.0)
MCV: 85.9 fL (ref 80.0–100.0)
Platelets: 307 10*3/uL (ref 150–440)
RBC: 4.39 MIL/uL (ref 3.80–5.20)
RDW: 15.7 % — AB (ref 11.5–14.5)
WBC: 11.5 10*3/uL — ABNORMAL HIGH (ref 3.6–11.0)

## 2018-01-13 LAB — COMPREHENSIVE METABOLIC PANEL
ALBUMIN: 4.8 g/dL (ref 3.5–5.0)
ALT: 12 U/L (ref 0–44)
AST: 17 U/L (ref 15–41)
Alkaline Phosphatase: 50 U/L (ref 38–126)
Anion gap: 13 (ref 5–15)
BUN: 8 mg/dL (ref 6–20)
CO2: 26 mmol/L (ref 22–32)
Calcium: 9.2 mg/dL (ref 8.9–10.3)
Chloride: 101 mmol/L (ref 98–111)
Creatinine, Ser: 0.83 mg/dL (ref 0.44–1.00)
GFR calc Af Amer: >60 mL/min (ref >60)
GFR calc non Af Amer: >60 mL/min (ref >60)
GLUCOSE: 182 mg/dL — AB (ref 70–99)
POTASSIUM: 3 mmol/L — AB (ref 3.5–5.1)
SODIUM: 140 mmol/L (ref 135–145)
TOTAL PROTEIN: 8.8 g/dL — AB (ref 6.5–8.1)
Total Bilirubin: 0.9 mg/dL (ref 0.3–1.2)

## 2018-01-13 LAB — URINALYSIS, COMPLETE (UACMP) WITH MICROSCOPIC
Bilirubin Urine: NEGATIVE
Glucose, UA: NEGATIVE mg/dL
Ketones, ur: NEGATIVE mg/dL
Leukocytes, UA: NEGATIVE
NITRITE: NEGATIVE
PH: 6 (ref 5.0–8.0)
Protein, ur: 30 mg/dL — AB
SPECIFIC GRAVITY, URINE: 1.012 (ref 1.005–1.030)

## 2018-01-13 LAB — LIPASE, BLOOD: Lipase: 28 U/L (ref 11–51)

## 2018-01-13 MED ORDER — SODIUM CHLORIDE 0.9 % IV BOLUS
1000.0000 mL | Freq: Once | INTRAVENOUS | Status: AC
  Administered 2018-01-13: 1000 mL via INTRAVENOUS

## 2018-01-13 MED ORDER — ONDANSETRON HCL 4 MG/2ML IJ SOLN
4.0000 mg | Freq: Once | INTRAMUSCULAR | Status: AC | PRN
  Administered 2018-01-13: 4 mg via INTRAVENOUS
  Filled 2018-01-13: qty 2

## 2018-01-13 NOTE — ED Notes (Signed)
IV attempt x 2 by this RN. US IV requested by this RN at this time.

## 2018-01-13 NOTE — ED Triage Notes (Signed)
Pt arrives POV to triage with c/o N/V since 0000 this afternoon. Pt reports that she has been vomiting "off and on" since that time. Pt has not been able to take three of her BP pills as a result. Pt is otherwise in NAD.

## 2018-01-14 ENCOUNTER — Encounter: Payer: Self-pay | Admitting: Internal Medicine

## 2018-01-14 ENCOUNTER — Inpatient Hospital Stay
Admission: EM | Admit: 2018-01-14 | Discharge: 2018-01-15 | DRG: 074 | Disposition: A | Discharge status: Home / Self Care | Payer: Medicare Other | Attending: Internal Medicine | Admitting: Internal Medicine

## 2018-01-14 DIAGNOSIS — K3184 Gastroparesis: Secondary | ICD-10-CM | POA: Diagnosis not present

## 2018-01-14 DIAGNOSIS — Z888 Allergy status to other drugs, medicaments and biological substances status: Secondary | ICD-10-CM | POA: Diagnosis not present

## 2018-01-14 DIAGNOSIS — R809 Proteinuria, unspecified: Secondary | ICD-10-CM | POA: Diagnosis not present

## 2018-01-14 DIAGNOSIS — F419 Anxiety disorder, unspecified: Secondary | ICD-10-CM | POA: Diagnosis not present

## 2018-01-14 DIAGNOSIS — E1165 Type 2 diabetes mellitus with hyperglycemia: Secondary | ICD-10-CM | POA: Diagnosis not present

## 2018-01-14 DIAGNOSIS — E1143 Type 2 diabetes mellitus with diabetic autonomic (poly)neuropathy: Secondary | ICD-10-CM | POA: Diagnosis not present

## 2018-01-14 DIAGNOSIS — I10 Essential (primary) hypertension: Secondary | ICD-10-CM | POA: Diagnosis not present

## 2018-01-14 DIAGNOSIS — R630 Anorexia: Secondary | ICD-10-CM | POA: Diagnosis not present

## 2018-01-14 DIAGNOSIS — I160 Hypertensive urgency: Secondary | ICD-10-CM | POA: Diagnosis not present

## 2018-01-14 DIAGNOSIS — Z833 Family history of diabetes mellitus: Secondary | ICD-10-CM | POA: Diagnosis not present

## 2018-01-14 DIAGNOSIS — Z79899 Other long term (current) drug therapy: Secondary | ICD-10-CM | POA: Diagnosis not present

## 2018-01-14 DIAGNOSIS — Z6841 Body Mass Index (BMI) 40.0 and over, adult: Secondary | ICD-10-CM | POA: Diagnosis not present

## 2018-01-14 DIAGNOSIS — E876 Hypokalemia: Secondary | ICD-10-CM | POA: Diagnosis not present

## 2018-01-14 DIAGNOSIS — R109 Unspecified abdominal pain: Secondary | ICD-10-CM

## 2018-01-14 DIAGNOSIS — E860 Dehydration: Secondary | ICD-10-CM | POA: Diagnosis not present

## 2018-01-14 DIAGNOSIS — R112 Nausea with vomiting, unspecified: Secondary | ICD-10-CM | POA: Diagnosis not present

## 2018-01-14 DIAGNOSIS — D72829 Elevated white blood cell count, unspecified: Secondary | ICD-10-CM | POA: Diagnosis not present

## 2018-01-14 DIAGNOSIS — Z885 Allergy status to narcotic agent status: Secondary | ICD-10-CM | POA: Diagnosis not present

## 2018-01-14 DIAGNOSIS — Z882 Allergy status to sulfonamides status: Secondary | ICD-10-CM | POA: Diagnosis not present

## 2018-01-14 LAB — TROPONIN I: Troponin I: <0.03 ng/mL (ref <0.03)

## 2018-01-14 LAB — PHOSPHORUS: Phosphorus: 3.3 mg/dL (ref 2.5–4.6)

## 2018-01-14 LAB — GLUCOSE, CAPILLARY
GLUCOSE-CAPILLARY: 101 mg/dL — AB (ref 70–99)
Glucose-Capillary: 120 mg/dL — ABNORMAL HIGH (ref 70–99)

## 2018-01-14 LAB — MAGNESIUM: MAGNESIUM: 1.7 mg/dL (ref 1.7–2.4)

## 2018-01-14 MED ORDER — POTASSIUM CHLORIDE CRYS ER 20 MEQ PO TBCR
30.0000 meq | EXTENDED_RELEASE_TABLET | ORAL | Status: AC
  Administered 2018-01-14 (×2): 30 meq via ORAL
  Filled 2018-01-14: qty 1
  Filled 2018-01-14: qty 2

## 2018-01-14 MED ORDER — FUROSEMIDE 20 MG PO TABS: 20.0000 mg | ORAL_TABLET | Freq: Every day | ORAL | Status: DC | PRN

## 2018-01-14 MED ORDER — LABETALOL HCL 5 MG/ML IV SOLN
INTRAVENOUS | Status: AC
  Filled 2018-01-14: qty 4

## 2018-01-14 MED ORDER — VITAMIN D 1000 UNITS PO TABS
1000.0000 [IU] | ORAL_TABLET | Freq: Every day | ORAL | Status: DC
  Administered 2018-01-14 – 2018-01-15 (×2): 1000 [IU] via ORAL
  Filled 2018-01-14 (×2): qty 1

## 2018-01-14 MED ORDER — METOCLOPRAMIDE HCL 5 MG/ML IJ SOLN
20.0000 mg | Freq: Once | INTRAVENOUS | Status: AC
  Administered 2018-01-14: 20 mg via INTRAVENOUS
  Filled 2018-01-14: qty 4

## 2018-01-14 MED ORDER — POTASSIUM CHLORIDE 10 MEQ/100ML IV SOLN
10.0000 meq | INTRAVENOUS | Status: AC
Start: 2018-01-14 — End: 2018-01-14
  Filled 2018-01-14 (×6): qty 100

## 2018-01-14 MED ORDER — ALBUTEROL SULFATE (2.5 MG/3ML) 0.083% IN NEBU
2.5000 mg | INHALATION_SOLUTION | Freq: Four times a day (QID) | RESPIRATORY_TRACT | Status: DC | PRN
Start: 2018-01-14 — End: 2018-01-15

## 2018-01-14 MED ORDER — HYDROMORPHONE HCL 1 MG/ML IJ SOLN
0.5000 mg | Freq: Once | INTRAMUSCULAR | Status: AC
  Administered 2018-01-14: 0.5 mg via INTRAVENOUS
  Filled 2018-01-14: qty 1

## 2018-01-14 MED ORDER — LISINOPRIL 20 MG PO TABS
20.0000 mg | ORAL_TABLET | Freq: Every day | ORAL | Status: DC
  Administered 2018-01-14: 20 mg via ORAL
  Filled 2018-01-14: qty 2

## 2018-01-14 MED ORDER — LABETALOL HCL 5 MG/ML IV SOLN
10.0000 mg | Freq: Once | INTRAVENOUS | Status: AC
  Administered 2018-01-14: 10 mg via INTRAVENOUS

## 2018-01-14 MED ORDER — BISACODYL 5 MG PO TBEC: 5.0000 mg | DELAYED_RELEASE_TABLET | Freq: Every day | ORAL | Status: DC | PRN

## 2018-01-14 MED ORDER — HYDROMORPHONE HCL 1 MG/ML IJ SOLN
0.5000 mg | INTRAMUSCULAR | Status: DC | PRN
  Administered 2018-01-14: 0.5 mg via INTRAVENOUS
  Administered 2018-01-14 – 2018-01-15 (×4): 1 mg via INTRAVENOUS
  Filled 2018-01-14 (×5): qty 1

## 2018-01-14 MED ORDER — ZOLPIDEM TARTRATE 5 MG PO TABS: 5.0000 mg | ORAL_TABLET | Freq: Every evening | ORAL | Status: DC | PRN

## 2018-01-14 MED ORDER — TIZANIDINE HCL 4 MG PO TABS
4.0000 mg | ORAL_TABLET | Freq: Three times a day (TID) | ORAL | Status: DC
  Administered 2018-01-14 – 2018-01-15 (×3): 4 mg via ORAL
  Filled 2018-01-14 (×5): qty 1

## 2018-01-14 MED ORDER — METOCLOPRAMIDE HCL 5 MG/ML IJ SOLN
5.0000 mg | Freq: Three times a day (TID) | INTRAMUSCULAR | Status: DC
  Administered 2018-01-14 – 2018-01-15 (×3): 5 mg via INTRAVENOUS
  Filled 2018-01-14 (×3): qty 2

## 2018-01-14 MED ORDER — CLONIDINE HCL 0.1 MG/24HR TD PTWK: 0.1000 mg | MEDICATED_PATCH | TRANSDERMAL | Status: DC

## 2018-01-14 MED ORDER — VITAMIN B-12 1000 MCG PO TABS
1000.0000 ug | ORAL_TABLET | Freq: Every day | ORAL | Status: DC
  Administered 2018-01-15: 1000 ug via ORAL
  Filled 2018-01-14: qty 1

## 2018-01-14 MED ORDER — INSULIN ASPART 100 UNIT/ML ~~LOC~~ SOLN: 0.0000 [IU] | Freq: Every day | SUBCUTANEOUS | Status: DC

## 2018-01-14 MED ORDER — ROSUVASTATIN CALCIUM 10 MG PO TABS
10.0000 mg | ORAL_TABLET | Freq: Every day | ORAL | Status: DC
  Administered 2018-01-14: 10 mg via ORAL
  Filled 2018-01-14: qty 1

## 2018-01-14 MED ORDER — LACTATED RINGERS IV SOLN
INTRAVENOUS | Status: AC
  Administered 2018-01-14 (×2): via INTRAVENOUS

## 2018-01-14 MED ORDER — NICARDIPINE HCL IN NACL 20-0.86 MG/200ML-% IV SOLN
3.0000 mg/h | INTRAVENOUS | Status: DC
  Administered 2018-01-14 (×2): 5 mg/h via INTRAVENOUS
  Filled 2018-01-14: qty 200

## 2018-01-14 MED ORDER — INSULIN ASPART 100 UNIT/ML ~~LOC~~ SOLN: 0.0000 [IU] | Freq: Three times a day (TID) | SUBCUTANEOUS | Status: DC

## 2018-01-14 MED ORDER — HYDROMORPHONE HCL 1 MG/ML IJ SOLN
0.5000 mg | Freq: Once | INTRAMUSCULAR | Status: AC
  Administered 2018-01-14: 0.5 mg via INTRAVENOUS

## 2018-01-14 MED ORDER — LAMOTRIGINE 25 MG PO TABS
100.0000 mg | ORAL_TABLET | Freq: Every day | ORAL | Status: DC
  Administered 2018-01-14 – 2018-01-15 (×2): 100 mg via ORAL
  Filled 2018-01-14: qty 1
  Filled 2018-01-14: qty 4

## 2018-01-14 MED ORDER — SENNOSIDES-DOCUSATE SODIUM 8.6-50 MG PO TABS: 1.0000 | ORAL_TABLET | Freq: Every evening | ORAL | Status: DC | PRN

## 2018-01-14 MED ORDER — NICARDIPINE HCL IN NACL 20-0.86 MG/200ML-% IV SOLN
INTRAVENOUS | Status: AC
  Administered 2018-01-14: 5 mg/h via INTRAVENOUS
  Filled 2018-01-14: qty 200

## 2018-01-14 MED ORDER — LABETALOL HCL 5 MG/ML IV SOLN
INTRAVENOUS | Status: AC
Start: 2018-01-14 — End: 2018-01-14
  Filled 2018-01-14: qty 4

## 2018-01-14 MED ORDER — HYDROMORPHONE HCL 1 MG/ML IJ SOLN
INTRAMUSCULAR | Status: AC
  Filled 2018-01-14: qty 1

## 2018-01-14 MED ORDER — ALPRAZOLAM 1 MG PO TABS
2.0000 mg | ORAL_TABLET | Freq: Three times a day (TID) | ORAL | Status: DC | PRN
  Administered 2018-01-14 – 2018-01-15 (×3): 2 mg via ORAL
  Filled 2018-01-14 (×2): qty 2
  Filled 2018-01-14: qty 4

## 2018-01-14 MED ORDER — MONTELUKAST SODIUM 10 MG PO TABS: 10.0000 mg | ORAL_TABLET | Freq: Every day | ORAL | Status: DC

## 2018-01-14 MED ORDER — ERYTHROMYCIN ETHYLSUCCINATE 200 MG/5ML PO SUSR
200.0000 mg | Freq: Three times a day (TID) | ORAL | Status: DC
  Administered 2018-01-15: 200 mg via ORAL
  Filled 2018-01-14 (×6): qty 5

## 2018-01-14 MED ORDER — LABETALOL HCL 5 MG/ML IV SOLN
20.0000 mg | Freq: Once | INTRAVENOUS | Status: AC
  Administered 2018-01-14: 20 mg via INTRAVENOUS

## 2018-01-14 MED ORDER — FLUOXETINE HCL 20 MG PO CAPS
20.0000 mg | ORAL_CAPSULE | Freq: Every day | ORAL | Status: DC
  Administered 2018-01-14 – 2018-01-15 (×2): 20 mg via ORAL
  Filled 2018-01-14 (×2): qty 1

## 2018-01-14 MED ORDER — ENOXAPARIN SODIUM 40 MG/0.4ML ~~LOC~~ SOLN
40.0000 mg | SUBCUTANEOUS | Status: DC
  Administered 2018-01-14: 40 mg via SUBCUTANEOUS
  Filled 2018-01-14: qty 0.4

## 2018-01-14 MED ORDER — FAMOTIDINE 20 MG PO TABS
20.0000 mg | ORAL_TABLET | Freq: Two times a day (BID) | ORAL | Status: DC
  Administered 2018-01-14 – 2018-01-15 (×3): 20 mg via ORAL
  Filled 2018-01-14 (×3): qty 1

## 2018-01-14 MED ORDER — ENOXAPARIN SODIUM 40 MG/0.4ML ~~LOC~~ SOLN
40.0000 mg | Freq: Two times a day (BID) | SUBCUTANEOUS | Status: DC
  Administered 2018-01-14 – 2018-01-15 (×2): 40 mg via SUBCUTANEOUS
  Filled 2018-01-14 (×2): qty 0.4

## 2018-01-14 MED ORDER — SODIUM CHLORIDE 0.9 % IV BOLUS
1000.0000 mL | Freq: Once | INTRAVENOUS | Status: AC
  Administered 2018-01-14: 1000 mL via INTRAVENOUS

## 2018-01-14 MED ORDER — ONDANSETRON HCL 4 MG/2ML IJ SOLN
4.0000 mg | Freq: Four times a day (QID) | INTRAMUSCULAR | Status: DC | PRN
  Filled 2018-01-14: qty 2

## 2018-01-14 MED ORDER — PANTOPRAZOLE SODIUM 40 MG PO TBEC
40.0000 mg | DELAYED_RELEASE_TABLET | Freq: Two times a day (BID) | ORAL | Status: DC
  Administered 2018-01-14 – 2018-01-15 (×3): 40 mg via ORAL
  Filled 2018-01-14 (×3): qty 1

## 2018-01-14 MED ORDER — TRAZODONE HCL 100 MG PO TABS
100.0000 mg | ORAL_TABLET | Freq: Every day | ORAL | Status: DC
  Administered 2018-01-14: 100 mg via ORAL
  Filled 2018-01-14: qty 1

## 2018-01-14 NOTE — H&P (Signed)
[facility] [place] [facility]   PATIENT NAME: Daisy [name]    MR#:  [id number]  DATE OF BIRTH:  February 09, 1966  DATE OF ADMISSION:  01/14/2018  PRIMARY CARE PHYSICIAN: Malva Limes, MD   REQUESTING/REFERRING PHYSICIAN: Darci Current, MD  CHIEF COMPLAINT:   Chief Complaint  Patient presents with  . Abdominal Pain    HISTORY OF PRESENT ILLNESS:  Daisy [name]  is a 52 y.o. female with a known history of T2NIDDM (w/ gastroparesis + nephropathy), morbid obesity p/w acute on chronic AP, intractable N/V. Pt admitted under similar circumstances in mid-June 2019. She states she was diagnosed w/ diabetes in 2009. She states that @~noon on Friday 01/13/2018, she developed acute worsening of her chronic diffuse AP, characterized as a worsening dull ache that comes and goes, localizing to epigastric + RLQ regions. She denies hematemesis, melena or hematochezia. She endorses loss of appetite, PO intolerance (food, fluids, medications), poor PO intake of food/fluids, dehydration. She endorses fatigue/malaise/generalized weakness. She denies fever/chills/diaphoresis/rigors/night sweats, cough/SOB, HA/vertigo, LH/LOC or urinary symptoms. BP in ED is elevated, as pt unable to tolerate home PO antihypertensives; refractory to multiple IV antihypertensive boluses.  PAST MEDICAL HISTORY:   Past Medical History:  Diagnosis Date  . Diabetes mellitus without complication (HCC)   . Gastritis   . Gastroparesis   . Hernia of abdominal wall   . Hypertension   . Migraine     PAST SURGICAL HISTORY:   Past Surgical History:  Procedure Laterality Date  . ABDOMINAL HYSTERECTOMY    . APPENDECTOMY    . APPLICATION OF WOUND VAC    . CHOLECYSTECTOMY    . HERNIA REPAIR    . KNEE SURGERY      SOCIAL HISTORY:   Social History   Tobacco Use  . Smoking status: Never Smoker  . Smokeless tobacco: Former Neurosurgeon    Types: Snuff  Substance Use Topics  . Alcohol use: Yes   Alcohol/week: 0.0 oz    Comment: only on special occasions- wine    FAMILY HISTORY:   Family History  Problem Relation Age of Onset  . Arthritis Mother   . Depression Mother   . Diabetes Mother   . Heart disease Mother   . Hyperlipidemia Mother   . Hypertension Mother   . Stroke Mother   . Arthritis Father   . Diabetes Father   . Hearing loss Father   . Heart disease Father   . Hyperlipidemia Father   . Hypertension Father     DRUG ALLERGIES:   Allergies  Allergen Reactions  . Sulfasalazine Hives  . Acetaminophen-Codeine Hives  . Atorvastatin Other (See Comments)    Muscle pain  . Codeine Hives  . Ibuprofen Nausea And Vomiting  . Morphine Nausea And Vomiting  . Oxycodone Hives  . Propoxyphene Other (See Comments)    Reaction:  GI upset   . Sulfa Antibiotics Hives  . Duloxetine Rash and Other (See Comments)    Yellowing of the skin    REVIEW OF SYSTEMS:   Review of Systems  Constitutional: Positive for malaise/fatigue. Negative for chills, diaphoresis, fever and weight loss.  HENT: Negative for congestion, ear pain, hearing loss, nosebleeds, sinus pain, sore throat and tinnitus.   Eyes: Negative for blurred vision, double vision and photophobia.  Respiratory: Negative for cough, hemoptysis, sputum production, shortness of breath and wheezing.   Cardiovascular: Negative for chest pain, palpitations, orthopnea, claudication, leg swelling and PND.  Gastrointestinal: Positive for abdominal  pain, nausea and vomiting. Negative for blood in stool, constipation, diarrhea, heartburn and melena.  Genitourinary: Negative for dysuria, frequency, hematuria and urgency.  Musculoskeletal: Negative for back pain, falls, joint pain, myalgias and neck pain.  Skin: Negative for itching and rash.  Neurological: Positive for weakness. Negative for dizziness, tingling, tremors, sensory change, speech change, focal weakness, seizures, loss of consciousness and headaches.    Psychiatric/Behavioral: Negative for memory loss. The patient does not have insomnia.    MEDICATIONS AT HOME:   Prior to Admission medications   Medication Sig Start Date End Date Taking? Authorizing Provider  albuterol (PROVENTIL HFA;VENTOLIN HFA) 108 (90 Base) MCG/ACT inhaler Inhale 2 puffs into the lungs every 6 (six) hours as needed for wheezing or shortness of breath. 12/03/17  Yes Fisher, Demetrios Isaacs, MD  alprazolam Prudy Feeler) 2 MG tablet Take 2 mg by mouth 3 (three) times daily as needed for anxiety.    Yes [provider]  cholecalciferol (VITAMIN D) 1000 units tablet Take 1,000 Units by mouth daily.   Yes [provider]  cloNIDine (CATAPRES - DOSED IN MG/24 HR) 0.1 mg/24hr patch PLACE 1 PATCH (0.1 MG TOTAL) ONTO THE SKIN ONCE A WEEK. 11/30/16  Yes Malva Limes, MD  FLUoxetine (PROZAC) 20 MG capsule Take 1 capsule by mouth daily.   Yes [provider]  fluticasone (FLONASE) 50 MCG/ACT nasal spray Place 2 sprays into both nostrils daily. 12/29/17  Yes Malva Limes, MD  furosemide (LASIX) 20 MG tablet Take 1 tablet (20 mg total) by mouth daily as needed for edema. 07/11/17  Yes Malva Limes, MD  lamoTRIgine (LAMICTAL) 100 MG tablet Take 1 tablet by mouth daily.   Yes [provider]  lisinopril (PRINIVIL,ZESTRIL) 20 MG tablet Take 1 tablet (20 mg total) by mouth daily. 11/04/17  Yes Malva Limes, MD  metFORMIN (GLUCOPHAGE) 500 MG tablet Take 1 tablet (500 mg total) by mouth 2 (two) times daily with a meal. 04/15/17  Yes Malva Limes, MD  metoCLOPramide (REGLAN) 10 MG tablet Take 1 tablet (10 mg total) by mouth 3 (three) times daily with meals. 11/30/17 11/30/18 Yes Auburn Bilberry, MD  Oxycodone HCl 10 MG TABS Take 1 tablet (10 mg total) by mouth every 6 (six) hours as needed (pain). 11/30/17  Yes Auburn Bilberry, MD  pantoprazole (PROTONIX) 40 MG tablet TAKE 1 TABLET BY MOUTH TWICE A DAY 12/04/17  Yes Malva Limes, MD  promethazine (PHENERGAN)  12.5 MG tablet Take 1 tablet (12.5 mg total) by mouth every 6 (six) hours as needed for refractory nausea / vomiting. 11/30/17  Yes Auburn Bilberry, MD  protein supplement shake (PREMIER PROTEIN) LIQD Take 325 mLs (11 oz total) by mouth 3 (three) times daily between meals. 03/18/17  Yes Enedina Finner, MD  ranitidine (ZANTAC) 150 MG tablet Take 1 tablet (150 mg total) by mouth at bedtime. 10/11/17  Yes Malva Limes, MD  rosuvastatin (CRESTOR) 10 MG tablet Take 1 tablet (10 mg total) by mouth daily. 10/11/17  Yes Malva Limes, MD  tiZANidine (ZANAFLEX) 4 MG tablet Take 4 mg by mouth 3 (three) times daily. 1-3 tablets per day as tolerated per Dr. Metta Clines   Yes [provider]  traZODone (DESYREL) 100 MG tablet Take 100 mg by mouth at bedtime. 10/12/16  Yes [provider]  vitamin B-12 (CYANOCOBALAMIN) 1000 MCG tablet Take 1,000 mcg by mouth daily.   Yes [provider]  vitamin C (ASCORBIC ACID) 500 MG tablet Take  500 mg by mouth daily.   Yes [provider]  zolpidem (AMBIEN) 10 MG tablet Take 1 tablet by mouth at bedtime as needed. 03/23/17  Yes [provider]  loratadine (CLARITIN) 10 MG tablet Take 1 tablet (10 mg total) by mouth daily. Patient not taking: Reported on 01/14/2018 03/18/17   Enedina Finner, MD  montelukast (SINGULAIR) 10 MG tablet TAKE 1 TABLET BY MOUTH EVERYDAY AT BEDTIME Patient not taking: Reported on 01/14/2018 10/10/17   Malva Limes, MD      VITAL SIGNS:  Blood pressure (!) 194/107, pulse 100, temperature 98.3 F (36.8 C), temperature source Oral, resp. rate 17, height 5\' 5"  (1.651 m), weight 129.3 kg (285 lb), SpO2 98 %.  PHYSICAL EXAMINATION:  Physical Exam  Constitutional: She is oriented to person, place, and time. She appears well-developed and well-nourished. She is active and cooperative.  Non-toxic appearance. She does not have a sickly appearance. She does not appear ill. She appears distressed (Mild distress 2/2 AP). She is  not intubated.  HENT:  Head: Normocephalic and atraumatic.  Mouth/Throat: Oropharynx is clear and moist. No oropharyngeal exudate.  Eyes: Conjunctivae, EOM and lids are normal. No scleral icterus.  Neck: Neck supple. No JVD present. No thyromegaly present.  Cardiovascular: Normal rate, regular rhythm, S1 normal, S2 normal, normal heart sounds and intact distal pulses.  No extrasystoles are present. Exam reveals no gallop, no S3, no S4, no distant heart sounds and no friction rub.  No murmur heard. Pulmonary/Chest: Effort normal. No accessory muscle usage or stridor. No apnea, no tachypnea and no bradypnea. She is not intubated. No respiratory distress. She has decreased breath sounds in the right upper field, the right middle field, the right lower field, the left upper field, the left middle field and the left lower field. She has no wheezes. She has no rhonchi. She has no rales.  Abdominal: Soft. Normal appearance. She exhibits no distension and no ascites. Bowel sounds are decreased. There is generalized tenderness and tenderness in the right lower quadrant and epigastric area. There is no rigidity, no rebound and no guarding.  Musculoskeletal: Normal range of motion. She exhibits no edema or tenderness.  Lymphadenopathy:    She has no cervical adenopathy.  Neurological: She is alert and oriented to person, place, and time. She is not disoriented.  Skin: Skin is warm, dry and intact. No rash noted. She is not diaphoretic. No erythema.  Psychiatric: She has a normal mood and affect. Her speech is normal and behavior is normal. Judgment and thought content normal. Cognition and memory are normal.   LABORATORY PANEL:   CBC Recent Labs  Lab 01/13/18 2228  WBC 11.5*  HGB 13.2  HCT 37.7  PLT 307   ------------------------------------------------------------------------------------------------------------------  Chemistries  Recent Labs  Lab 01/13/18 0245 01/13/18 2228  NA  --  140  K   --  3.0*  CL  --  101  CO2  --  26  GLUCOSE  --  182*  BUN  --  8  CREATININE  --  0.83  CALCIUM  --  9.2  MG 1.7  --   AST  --  17  ALT  --  12  ALKPHOS  --  50  BILITOT  --  0.9   ------------------------------------------------------------------------------------------------------------------  Cardiac Enzymes Recent Labs  Lab 01/13/18 2228  TROPONINI <0.03   ------------------------------------------------------------------------------------------------------------------  RADIOLOGY:  No results found.  IMPRESSION AND PLAN:   A/P: 6F T2NIDDM w/ gastroparesis p/w acute on  chronic AP + intractable N/V, suspect acute gastroparesis flare. Uncontrolled HTN/accelerated malignant HTN/hypertensive urgency 2/2 inability to tolerate PO antihypertensives, refractory to IV antihypertensives, on Cardene gtt. Dehydration, hypokalemia, hyperglycemia (w/ T2NIDDM), proteinuria, leukocytosis (WBC < 12.0). -Admit to Stepdown for IV antihypertensive gtt (Cardene) -Continuous cardiac monitoring -Continuous pulse ox -Cardene gtt, permissive HTN, goal SBP 180 x24hrs -Reintroduce home antihypertensive agents as tolerated -c/w Clonidine patch -NPO -Reglan 10mg  IV q6h -Zofran PRN -Erythromycin PO TID 30min before attempted PO intake -Recurrent admissions, may benefit from outpt evaluation for gastric pacemaker -Endorses chronic AP at baseline, takes Oxycodone from pain clinic; IV Dilaudid for now, as pt unable to tolerate PO -IVF -Replete K+ -Mag WNL (1.7) -Phos WNL (3.3) -SSI, hold PO antihyperglycemics; urine microalbumin -c/w home meds/formulary subs as tolerated -NPO for now, cardiac diabetic diet as tolerated -Lovenox -Full code -Admission, > 2 midnights   All the records are reviewed and case discussed with ED provider. Management plans discussed with the patient, family and they are in agreement.  CODE STATUS: Full code  TOTAL TIME TAKING CARE OF THIS PATIENT: 75 minutes.      Barbaraann RondoPrasanna Ajani Schnieders M.D on 01/14/2018 at 4:52 AM  Between 7am to 6pm - Pager - [phone]  After 6pm go to [email or web address] - Social research officer, governmentpassword [email or web address] [email or web address]  [facility]  Office  [phone]  CC: Primary care physician; Malva LimesFisher, Donald E, MD   Note: This dictation was prepared with Dragon dictation along with smaller phrase technology. Any transcriptional errors that result from this process are unintentional.

## 2018-01-14 NOTE — ED Notes (Addendum)
Pt resting on bed with eyes closed.

## 2018-01-14 NOTE — ED Notes (Addendum)
Spoke with Dr. Juliene PinaMody about pt having nausea and vomiting, Dr. Juliene PinaMody reports will put some orders, informed Dr. Juliene PinaMody about unable to get 2nd IV access, unable to start potassium IV.

## 2018-01-14 NOTE — ED Notes (Signed)
Reassessed IV site to left upper arm swelling down pt denies any pain or discomfort to area.

## 2018-01-14 NOTE — ED Notes (Signed)
Lab unable to collect sample. Reeves DamJadie, RN aware.

## 2018-01-14 NOTE — ED Notes (Signed)
Morrie SheldonAshley, RN unable to take report at this time.

## 2018-01-14 NOTE — ED Provider Notes (Signed)
[facility] Emergency Department Provider Note    First MD Initiated Contact with Patient 01/14/18 [phone]     (approximate)  I have reviewed the triage vital signs and the nursing notes.   HISTORY  Chief Complaint Abdominal Pain    HPI Daisy [name] is a 52 y.o. female with below list of chronic medical conditions including diabetes mellitus gastroparesis requiring admissions in the past presents to the emergency department with 10 out of 10 epigastric abdominal pain with accompanying nausea and vomiting since 12:00 yesterday afternoon.  Patient denies any diarrhea no fever.  Patient also states that she was unable to take her antihypertensive secondary to vomiting.  Patient denies any chest pain no shortness of breath.  Patient denies any dizziness or diaphoresis.  Past Medical History:  Diagnosis Date  . Diabetes mellitus without complication (HCC)   . Gastritis   . Gastroparesis   . Hernia of abdominal wall   . Hypertension   . Migraine     Patient Active Problem List   Diagnosis Date Noted  . Intractable nausea and vomiting 01/14/2018  . Allergic rhinitis 04/18/2017  . Gastroparesis 01/03/2017  . Hyperlipidemia 06/18/2016  . DMII (diabetes mellitus, type 2) (HCC) 06/18/2016  . Bipolar 2 disorder (HCC) 06/18/2016  . Depression 06/18/2016  . GERD (gastroesophageal reflux disease) 06/18/2016  . Hypertension 06/18/2016  . Ingrown toenail 06/18/2016  . Hypokalemia 09/28/2015  . Chronic abdominal pain 05/23/2015  . Complex ovarian cyst 05/23/2015  . Anxiety 03/27/2015  . Insomnia 03/27/2015  . Gastritis 01/07/2015  . DDD (degenerative disc disease), lumbar 11/28/2014  . DJD (degenerative joint disease) of knee 11/28/2014  . Migraine headache 11/28/2014  . Bilateral occipital neuralgia 11/28/2014  . Morbid obesity (HCC) 11/28/2014  . Neuropathy due to secondary diabetes (HCC) 11/28/2014  . Abdominal wall abscess 09/20/2014  . Back pain,  chronic 09/20/2014  . Incisional hernia, without obstruction or gangrene 04/30/2014  . Bilateral chronic knee pain 03/04/2014    Past Surgical History:  Procedure Laterality Date  . ABDOMINAL HYSTERECTOMY    . APPENDECTOMY    . APPLICATION OF WOUND VAC    . CHOLECYSTECTOMY    . HERNIA REPAIR    . KNEE SURGERY      Prior to Admission medications   Medication Sig Start Date End Date Taking? Authorizing Provider  albuterol (PROVENTIL HFA;VENTOLIN HFA) 108 (90 Base) MCG/ACT inhaler Inhale 2 puffs into the lungs every 6 (six) hours as needed for wheezing or shortness of breath. 12/03/17  Yes Fisher, Demetrios Isaacs, MD  alprazolam Prudy Feeler) 2 MG tablet Take 2 mg by mouth 3 (three) times daily as needed for anxiety.    Yes [provider]  cholecalciferol (VITAMIN D) 1000 units tablet Take 1,000 Units by mouth daily.   Yes [provider]  cloNIDine (CATAPRES - DOSED IN MG/24 HR) 0.1 mg/24hr patch PLACE 1 PATCH (0.1 MG TOTAL) ONTO THE SKIN ONCE A WEEK. 11/30/16  Yes Malva Limes, MD  FLUoxetine (PROZAC) 20 MG capsule Take 1 capsule by mouth daily.   Yes [provider]  fluticasone (FLONASE) 50 MCG/ACT nasal spray Place 2 sprays into both nostrils daily. 12/29/17  Yes Malva Limes, MD  furosemide (LASIX) 20 MG tablet Take 1 tablet (20 mg total) by mouth daily as needed for edema. 07/11/17  Yes Malva Limes, MD  lamoTRIgine (LAMICTAL) 100 MG tablet Take 1 tablet by mouth daily.   Yes [provider]  lisinopril (PRINIVIL,ZESTRIL)  20 MG tablet Take 1 tablet (20 mg total) by mouth daily. 11/04/17  Yes Malva LimesFisher, Donald E, MD  metFORMIN (GLUCOPHAGE) 500 MG tablet Take 1 tablet (500 mg total) by mouth 2 (two) times daily with a meal. 04/15/17  Yes Malva LimesFisher, Donald E, MD  metoCLOPramide (REGLAN) 10 MG tablet Take 1 tablet (10 mg total) by mouth 3 (three) times daily with meals. 11/30/17 11/30/18 Yes Auburn BilberryPatel, Shreyang, MD  Oxycodone HCl 10 MG TABS Take 1 tablet (10 mg total) by  mouth every 6 (six) hours as needed (pain). 11/30/17  Yes Auburn BilberryPatel, Shreyang, MD  pantoprazole (PROTONIX) 40 MG tablet TAKE 1 TABLET BY MOUTH TWICE A DAY 12/04/17  Yes Malva LimesFisher, Donald E, MD  promethazine (PHENERGAN) 12.5 MG tablet Take 1 tablet (12.5 mg total) by mouth every 6 (six) hours as needed for refractory nausea / vomiting. 11/30/17  Yes Auburn BilberryPatel, Shreyang, MD  protein supplement shake (PREMIER PROTEIN) LIQD Take 325 mLs (11 oz total) by mouth 3 (three) times daily between meals. 03/18/17  Yes Enedina FinnerPatel, Sona, MD  ranitidine (ZANTAC) 150 MG tablet Take 1 tablet (150 mg total) by mouth at bedtime. 10/11/17  Yes Malva LimesFisher, Donald E, MD  rosuvastatin (CRESTOR) 10 MG tablet Take 1 tablet (10 mg total) by mouth daily. 10/11/17  Yes Malva LimesFisher, Donald E, MD  tiZANidine (ZANAFLEX) 4 MG tablet Take 4 mg by mouth 3 (three) times daily. 1-3 tablets per day as tolerated per Dr. Metta Clinesrisp   Yes [provider]  traZODone (DESYREL) 100 MG tablet Take 100 mg by mouth at bedtime. 10/12/16  Yes [provider]  vitamin B-12 (CYANOCOBALAMIN) 1000 MCG tablet Take 1,000 mcg by mouth daily.   Yes [provider]  vitamin C (ASCORBIC ACID) 500 MG tablet Take 500 mg by mouth daily.   Yes [provider]  zolpidem (AMBIEN) 10 MG tablet Take 1 tablet by mouth at bedtime as needed. 03/23/17  Yes [provider]  loratadine (CLARITIN) 10 MG tablet Take 1 tablet (10 mg total) by mouth daily. Patient not taking: Reported on 01/14/2018 03/18/17   Enedina FinnerPatel, Sona, MD  montelukast (SINGULAIR) 10 MG tablet TAKE 1 TABLET BY MOUTH EVERYDAY AT BEDTIME Patient not taking: Reported on 01/14/2018 10/10/17   Malva LimesFisher, Donald E, MD    Allergies Sulfasalazine; Acetaminophen-codeine; Atorvastatin; Codeine; Ibuprofen; Morphine; Oxycodone; Propoxyphene; Sulfa antibiotics; and Duloxetine  Family History  Problem Relation Age of Onset  . Arthritis Mother   . Depression Mother   . Diabetes Mother   . Heart disease Mother   .  Hyperlipidemia Mother   . Hypertension Mother   . Stroke Mother   . Arthritis Father   . Diabetes Father   . Hearing loss Father   . Heart disease Father   . Hyperlipidemia Father   . Hypertension Father     Social History Social History   Tobacco Use  . Smoking status: Never Smoker  . Smokeless tobacco: Former NeurosurgeonUser    Types: Snuff  Substance Use Topics  . Alcohol use: Yes    Alcohol/week: 0.0 oz    Comment: only on special occasions- wine  . Drug use: No    Review of Systems Constitutional: No fever/chills Eyes: No visual changes. ENT: No sore throat. Cardiovascular: Denies chest pain. Respiratory: Denies shortness of breath. Gastrointestinal: Positive for abdominal pain nausea and vomiting no diarrhea.  No constipation. Genitourinary: Negative for dysuria. Musculoskeletal: Negative for neck pain.  Negative for back pain. Integumentary: Negative for rash. Neurological: Negative for headaches,  focal weakness or numbness.   ____________________________________________   PHYSICAL EXAM:  VITAL SIGNS: ED Triage Vitals [01/13/18 2121]  Enc Vitals Group     BP (!) 207/118     Pulse Rate 85     Resp 18     Temp 98.5 F (36.9 C)     Temp Source Oral     SpO2 100 %     Weight 129.3 kg (285 lb)     Height 1.651 m (5\' 5" )     Head Circumference      Peak Flow      Pain Score 10     Pain Loc      Pain Edu?      Excl. in GC?     Constitutional: Alert and oriented. Well appearing and in no acute distress. Eyes: Conjunctivae are normal. PERRL Mouth/Throat: Mucous membranes are moist.  Oropharynx non-erythematous. Neck: No stridor.   Cardiovascular: Normal rate, regular rhythm. Good peripheral circulation. Grossly normal heart sounds. Respiratory: Normal respiratory effort.  No retractions. Lungs CTAB. Gastrointestinal: Epigastric tenderness to palpation.. No distention.  Musculoskeletal: No lower extremity tenderness nor edema. No gross deformities of  extremities. Neurologic:  Normal speech and language. No gross focal neurologic deficits are appreciated.  Skin:  Skin is warm, dry and intact. No rash noted. Psychiatric: Mood and affect are normal. Speech and behavior are normal.  ____________________________________________   LABS (all labs ordered are listed, but only abnormal results are displayed)  Labs Reviewed  COMPREHENSIVE METABOLIC PANEL - Abnormal; Notable for the following components:      Result Value   Potassium 3.0 (*)    Glucose, Bld 182 (*)    Total Protein 8.8 (*)    All other components within normal limits  CBC - Abnormal; Notable for the following components:   WBC 11.5 (*)    RDW 15.7 (*)    All other components within normal limits  URINALYSIS, COMPLETE (UACMP) WITH MICROSCOPIC - Abnormal; Notable for the following components:   Color, Urine STRAW (*)    APPearance CLEAR (*)    Hgb urine dipstick SMALL (*)    Protein, ur 30 (*)    Bacteria, UA RARE (*)    All other components within normal limits  LIPASE, BLOOD  MAGNESIUM  PHOSPHORUS  TROPONIN I  MICROALBUMIN, URINE       Critical Care performed:   .Critical Care Performed by: Darci Current, MD Authorized by: Darci Current, MD   Critical care provider statement:    Critical care time (minutes):  30   Critical care time was exclusive of:  Separately billable procedures and treating other patients (Hypertensive urgency)   Critical care was time spent personally by me on the following activities:  Development of treatment plan with patient or surrogate, discussions with consultants, evaluation of patient's response to treatment, examination of patient, obtaining history from patient or surrogate, ordering and performing treatments and interventions, ordering and review of laboratory studies, ordering and review of radiographic studies, pulse oximetry, re-evaluation of patient's condition and review of old charts   I assumed direction of  critical care for this patient from another provider in my specialty: no       ____________________________________________   INITIAL IMPRESSION / ASSESSMENT AND PLAN / ED COURSE  As part of my medical decision making, I reviewed the following data within the electronic MEDICAL RECORD NUMBER   52 year old female presented with above-stated history and physical exam of epigastric abdominal pain with  accompanying nausea and vomiting consistent with previous episodes of gastroparesis.  Patient given IV Reglan without any improvement of symptoms.  Patient given multiple doses of IV Dilaudid with minimal improvement of pain.  Patient also noted to be markedly hypertensive and as such was given labetalol 20 mg x 2 doses with minimal improvement in hypertension.  As such patient discussed with Dr. Bosie Helper for hospital admission for intractable nausea vomiting and abdominal pain as well as malignant hypertension.  ____________________________________________  FINAL CLINICAL IMPRESSION(S) / ED DIAGNOSES  Final diagnoses:  Intractable vomiting with nausea, unspecified vomiting type  Intractable abdominal pain  Malignant hypertension     MEDICATIONS GIVEN DURING THIS VISIT:  Medications  nicardipine (CARDENE) 20mg  in 0.86% saline IV infusion (0.1 mg/ml) (5 mg/hr Intravenous New Bag/Given 01/14/18 0426)  lactated ringers infusion ( Intravenous New Bag/Given 01/14/18 0254)  erythromycin ethylsuccinate (EES) 200 MG/5ML suspension 200 mg (has no administration in time range)  HYDROmorphone (DILAUDID) injection 0.5-1 mg (0.5 mg Intravenous Given 01/14/18 0545)  potassium chloride 10 mEq in 100 mL IVPB (has no administration in time range)  ondansetron (ZOFRAN) injection 4 mg (4 mg Intravenous Given 01/13/18 2226)  sodium chloride 0.9 % bolus 1,000 mL (0 mLs Intravenous Stopped 01/13/18 2349)  HYDROmorphone (DILAUDID) injection 0.5 mg (0.5 mg Intravenous Given 01/14/18 0042)  metoCLOPramide (REGLAN) 20 mg in  dextrose 5 % 50 mL IVPB (0 mg Intravenous Stopped 01/14/18 0217)  sodium chloride 0.9 % bolus 1,000 mL (0 mLs Intravenous Stopped 01/14/18 0148)  HYDROmorphone (DILAUDID) injection 0.5 mg (0.5 mg Intravenous Given 01/14/18 0105)  labetalol (NORMODYNE,TRANDATE) injection 10 mg (10 mg Intravenous Given 01/14/18 0127)  labetalol (NORMODYNE,TRANDATE) injection 10 mg (10 mg Intravenous Given 01/14/18 0148)  labetalol (NORMODYNE,TRANDATE) injection 20 mg (20 mg Intravenous Given 01/14/18 0223)     ED Discharge Orders    None       Note:  This document was prepared using Dragon voice recognition software and may include unintentional dictation errors.    Darci Current, MD 01/14/18 [phone]

## 2018-01-14 NOTE — ED Notes (Signed)
Reeves DamJadie, RN given report.

## 2018-01-14 NOTE — ED Notes (Signed)
Pt ambulatory to bedside commode no assistance needed it, pt walks with steady gait

## 2018-01-14 NOTE — ED Notes (Signed)
Resumed LR infusion 125mg  /hr pt has only one IV access, IV team attempted to get IV access was not successful

## 2018-01-14 NOTE — ED Notes (Signed)
IV located left upper arm discontinued as pt complained of discomfort upon assessment infiltration was noted, LR stopped removed IV and provided pt with ice bag.

## 2018-01-14 NOTE — Progress Notes (Signed)
Patient briefly seen in the emergency room.  Agree with admitting MD plan.  Case discussed with intensivist.  Patient's blood pressure still elevated.  Patient will need to continue with nifedipine drip.

## 2018-01-14 NOTE — ED Notes (Signed)
IV team at bedside 

## 2018-01-14 NOTE — ED Notes (Signed)
Unable to administer medication Potassium to pt, pt only has one IV access and it is not compatible with Cardene.

## 2018-01-14 NOTE — Plan of Care (Signed)
  Problem: Education: Goal: Knowledge of General Education information will improve Description: Including pain rating scale, medication(s)/side effects and non-pharmacologic comfort measures Outcome: Progressing   Problem: Health Behavior/Discharge Planning: Goal: Ability to manage health-related needs will improve Outcome: Progressing   Problem: Clinical Measurements: Goal: Ability to maintain clinical measurements within normal limits will improve Outcome: Progressing Goal: Will remain free from infection Outcome: Progressing Goal: Diagnostic test results will improve Outcome: Progressing Goal: Cardiovascular complication will be avoided Outcome: Progressing   Problem: Activity: Goal: Risk for activity intolerance will decrease Outcome: Progressing   Problem: Nutrition: Goal: Adequate nutrition will be maintained Outcome: Progressing   Problem: Coping: Goal: Level of anxiety will decrease Outcome: Progressing   Problem: Elimination: Goal: Will not experience complications related to bowel motility Outcome: Progressing   Problem: Pain Managment: Goal: General experience of comfort will improve Outcome: Progressing   Problem: Safety: Goal: Ability to remain free from injury will improve Outcome: Progressing   

## 2018-01-14 NOTE — ED Notes (Signed)
Pt difficult stick Rn attempted twice to get second IV not successful

## 2018-01-14 NOTE — Progress Notes (Signed)
PHARMACIST - PHYSICIAN COMMUNICATION  CONCERNING:  Enoxaparin (Lovenox) for DVT Prophylaxis    RECOMMENDATION: Patient was prescribed enoxaprin 40mg  q24 hours for VTE prophylaxis.   Filed Weights   01/13/18 2121  Weight: 285 lb (129.3 kg)    Body mass index is 47.43 kg/m.  Estimated Creatinine Clearance: 111.2 mL/min (by C-G formula based on SCr of 0.83 mg/dL).   [facility] for enoxaparin 40mg  every 12 hour dosing due to BMI being >40.   DESCRIPTION: Pharmacy has [facility] approved through [employer] committee.  Patient is now receiving enoxaparin 40mg  every 12 hours.    Pricilla RiffleAbby K Ellington, PharmD Pharmacy Resident  01/14/2018 4:01 PM

## 2018-01-14 NOTE — ED Notes (Signed)
Talked to Dr. Juliene PinaMody about pt's BP 222/131 received orders to continue to on Cardene drip

## 2018-01-15 DIAGNOSIS — Z885 Allergy status to narcotic agent status: Secondary | ICD-10-CM | POA: Diagnosis not present

## 2018-01-15 DIAGNOSIS — R112 Nausea with vomiting, unspecified: Secondary | ICD-10-CM | POA: Diagnosis not present

## 2018-01-15 DIAGNOSIS — Z79899 Other long term (current) drug therapy: Secondary | ICD-10-CM | POA: Diagnosis not present

## 2018-01-15 DIAGNOSIS — Z833 Family history of diabetes mellitus: Secondary | ICD-10-CM | POA: Diagnosis not present

## 2018-01-15 DIAGNOSIS — E860 Dehydration: Secondary | ICD-10-CM | POA: Diagnosis not present

## 2018-01-15 DIAGNOSIS — Z888 Allergy status to other drugs, medicaments and biological substances status: Secondary | ICD-10-CM | POA: Diagnosis not present

## 2018-01-15 DIAGNOSIS — I10 Essential (primary) hypertension: Secondary | ICD-10-CM | POA: Diagnosis not present

## 2018-01-15 DIAGNOSIS — Z882 Allergy status to sulfonamides status: Secondary | ICD-10-CM | POA: Diagnosis not present

## 2018-01-15 DIAGNOSIS — E876 Hypokalemia: Secondary | ICD-10-CM | POA: Diagnosis not present

## 2018-01-15 DIAGNOSIS — E1165 Type 2 diabetes mellitus with hyperglycemia: Secondary | ICD-10-CM | POA: Diagnosis not present

## 2018-01-15 DIAGNOSIS — R809 Proteinuria, unspecified: Secondary | ICD-10-CM | POA: Diagnosis not present

## 2018-01-15 DIAGNOSIS — I160 Hypertensive urgency: Secondary | ICD-10-CM | POA: Diagnosis not present

## 2018-01-15 DIAGNOSIS — E1143 Type 2 diabetes mellitus with diabetic autonomic (poly)neuropathy: Secondary | ICD-10-CM | POA: Diagnosis not present

## 2018-01-15 DIAGNOSIS — D72829 Elevated white blood cell count, unspecified: Secondary | ICD-10-CM | POA: Diagnosis not present

## 2018-01-15 DIAGNOSIS — K3184 Gastroparesis: Secondary | ICD-10-CM | POA: Diagnosis not present

## 2018-01-15 LAB — BASIC METABOLIC PANEL
ANION GAP: 9 (ref 5–15)
BUN: 14 mg/dL (ref 6–20)
CALCIUM: 8.5 mg/dL — AB (ref 8.9–10.3)
CO2: 27 mmol/L (ref 22–32)
Chloride: 101 mmol/L (ref 98–111)
Creatinine, Ser: 1.69 mg/dL — ABNORMAL HIGH (ref 0.44–1.00)
GFR calc Af Amer: 40 mL/min — ABNORMAL LOW (ref >60)
GFR, EST NON AFRICAN AMERICAN: 34 mL/min — AB (ref >60)
Glucose, Bld: 122 mg/dL — ABNORMAL HIGH (ref 70–99)
POTASSIUM: 3.6 mmol/L (ref 3.5–5.1)
SODIUM: 137 mmol/L (ref 135–145)

## 2018-01-15 LAB — GLUCOSE, CAPILLARY: Glucose-Capillary: 107 mg/dL — ABNORMAL HIGH (ref 70–99)

## 2018-01-15 LAB — MICROALBUMIN, URINE: Microalb, Ur: 300.4 ug/mL — ABNORMAL HIGH

## 2018-01-15 MED ORDER — LACTATED RINGERS IV BOLUS
1000.0000 mL | Freq: Once | INTRAVENOUS | Status: AC
  Administered 2018-01-15: 1000 mL via INTRAVENOUS

## 2018-01-15 MED ORDER — SODIUM CHLORIDE 0.9 % IV BOLUS
1000.0000 mL | Freq: Once | INTRAVENOUS | Status: AC
  Administered 2018-01-15: 1000 mL via INTRAVENOUS

## 2018-01-15 NOTE — Progress Notes (Signed)
Patient BP improved to 80s/40s after initial bolus. Patient had clonidine patch on arm. Removed. MD notified. Acknowledged. New orders placed. Patient given second bolus. BP improved to 90s/50s. Last BP 110s/60s. Patient asymptomatic. Resting in bed. Complained of abdominal pain once. Medicated as needed. Will monitor

## 2018-01-15 NOTE — Progress Notes (Signed)
Patient bp 63/47.. MD called ... 1L fluid bolus ordered

## 2018-01-15 NOTE — Discharge Summary (Signed)
[facility] [place] [facility]   PATIENT NAME: Daisy [name]    MR#:  [id number]  DATE OF BIRTH:  Jun 23, 1965  DATE OF ADMISSION:  01/14/2018 ADMITTING PHYSICIAN: Adrian Saran, MD  DATE OF DISCHARGE: 01/15/2018  PRIMARY CARE PHYSICIAN: Malva Limes, MD    ADMISSION DIAGNOSIS:  Malignant hypertension [I10] Intractable abdominal pain [R10.9] Intractable vomiting with nausea, unspecified vomiting type [R11.2] Nausea & vomiting [R11.2]  DISCHARGE DIAGNOSIS:  Active Problems:   Intractable nausea and vomiting   Nausea & vomiting   SECONDARY DIAGNOSIS:   Past Medical History:  Diagnosis Date  . Diabetes mellitus without complication (HCC)   . Gastritis   . Gastroparesis   . Hernia of abdominal wall   . Hypertension   . Migraine     HOSPITAL COURSE:   52 year old female with history of diabetes and gastroparesis who presented with nausea vomiting.      1.  Diabetic gastroparesis with intractable nausea and vomiting which is now subsided and she is tolerating her diet  2.  Hypertensive urgency: Patient's blood pressure was sky high upon arrival.  Her blood pressure was treated with nicardipine drip.  She is on several medications for her blood pressure at home.  She was unable to take them due to nausea and vomiting which led to her elevated blood pressure.  Her blood pressure is now low normal.  She is instructed to hold blood pressure medications and monitor her blood pressure and restart these probably tomorrow.  3.  Acute kidney injury in the setting of hypertensive urgency: Patient will need repeat BMP in 2 days to assure that creatinine has improved 4.  Diabetes: Patient will continue outpatient medications and ADA diet with outpatient follow-up  5.  Depression: Continue Prozac  6.  Anxiety: Continue Xanax  7.  Hyperlipidemia: Continue statin  DISCHARGE CONDITIONS AND DIET:   Stable for discharge on diabetic diet  CONSULTS OBTAINED:   Treatment Team:  Barbaraann Rondo, MD  DRUG ALLERGIES:   Allergies  Allergen Reactions  . Sulfasalazine Hives  . Acetaminophen-Codeine Hives  . Atorvastatin Other (See Comments)    Muscle pain  . Codeine Hives  . Ibuprofen Nausea And Vomiting  . Morphine Nausea And Vomiting  . Oxycodone Hives  . Propoxyphene Other (See Comments)    Reaction:  GI upset   . Sulfa Antibiotics Hives  . Duloxetine Rash and Other (See Comments)    Yellowing of the skin    DISCHARGE MEDICATIONS:   Allergies as of 01/15/2018      Reactions   Sulfasalazine Hives   Acetaminophen-codeine Hives   Atorvastatin Other (See Comments)   Muscle pain   Codeine Hives   Ibuprofen Nausea And Vomiting   Morphine Nausea And Vomiting   Oxycodone Hives   Propoxyphene Other (See Comments)   Reaction:  GI upset    Sulfa Antibiotics Hives   Duloxetine Rash, Other (See Comments)   Yellowing of the skin      Medication List    TAKE these medications   albuterol 108 (90 Base) MCG/ACT inhaler Commonly known as:  PROVENTIL HFA;VENTOLIN HFA Inhale 2 puffs into the lungs every 6 (six) hours as needed for wheezing or shortness of breath.   alprazolam 2 MG tablet Commonly known as:  XANAX Take 2 mg by mouth 3 (three) times daily as needed for anxiety.   cholecalciferol 1000 units tablet Commonly known as:  VITAMIN D Take 1,000 Units by mouth daily.   cloNIDine  0.1 mg/24hr patch Commonly known as:  CATAPRES - Dosed in mg/24 hr PLACE 1 PATCH (0.1 MG TOTAL) ONTO THE SKIN ONCE A WEEK.   FLUoxetine 20 MG capsule Commonly known as:  PROZAC Take 1 capsule by mouth daily.   fluticasone 50 MCG/ACT nasal spray Commonly known as:  FLONASE Place 2 sprays into both nostrils daily.   furosemide 20 MG tablet Commonly known as:  LASIX Take 1 tablet (20 mg total) by mouth daily as needed for edema.   lamoTRIgine 100 MG tablet Commonly known as:  LAMICTAL Take 1 tablet by mouth daily.   lisinopril 20 MG  tablet Commonly known as:  PRINIVIL,ZESTRIL Take 1 tablet (20 mg total) by mouth daily.   loratadine 10 MG tablet Commonly known as:  CLARITIN Take 1 tablet (10 mg total) by mouth daily.   metFORMIN 500 MG tablet Commonly known as:  GLUCOPHAGE Take 1 tablet (500 mg total) by mouth 2 (two) times daily with a meal.   metoCLOPramide 10 MG tablet Commonly known as:  REGLAN Take 1 tablet (10 mg total) by mouth 3 (three) times daily with meals.   montelukast 10 MG tablet Commonly known as:  SINGULAIR TAKE 1 TABLET BY MOUTH EVERYDAY AT BEDTIME   Oxycodone HCl 10 MG Tabs Take 1 tablet (10 mg total) by mouth every 6 (six) hours as needed (pain).   pantoprazole 40 MG tablet Commonly known as:  PROTONIX TAKE 1 TABLET BY MOUTH TWICE A DAY   promethazine 12.5 MG tablet Commonly known as:  PHENERGAN Take 1 tablet (12.5 mg total) by mouth every 6 (six) hours as needed for refractory nausea / vomiting.   protein supplement shake Liqd Commonly known as:  PREMIER PROTEIN Take 325 mLs (11 oz total) by mouth 3 (three) times daily between meals.   ranitidine 150 MG tablet Commonly known as:  ZANTAC Take 1 tablet (150 mg total) by mouth at bedtime.   rosuvastatin 10 MG tablet Commonly known as:  CRESTOR Take 1 tablet (10 mg total) by mouth daily.   tiZANidine 4 MG tablet Commonly known as:  ZANAFLEX Take 4 mg by mouth 3 (three) times daily. 1-3 tablets per day as tolerated per Dr. Metta Clines   traZODone 100 MG tablet Commonly known as:  DESYREL Take 100 mg by mouth at bedtime.   vitamin B-12 1000 MCG tablet Commonly known as:  CYANOCOBALAMIN Take 1,000 mcg by mouth daily.   vitamin C 500 MG tablet Commonly known as:  ASCORBIC ACID Take 500 mg by mouth daily.   zolpidem 10 MG tablet Commonly known as:  AMBIEN Take 1 tablet by mouth at bedtime as needed.         Today   CHIEF COMPLAINT:   Patient is doing well this morning.  Wants to go home.  Nausea and vomiting have  improved.  Tolerating diet well.   VITAL SIGNS:  Blood pressure 91/68, pulse 92, temperature 97.9 F (36.6 C), temperature source Oral, resp. rate 18, height 5\' 5"  (1.651 m), weight 285 lb (129.3 kg), SpO2 93 %.   REVIEW OF SYSTEMS:  Review of Systems  Constitutional: Negative.  Negative for chills, fever and malaise/fatigue.  HENT: Negative.  Negative for ear discharge, ear pain, hearing loss, nosebleeds and sore throat.   Eyes: Negative.  Negative for blurred vision and pain.  Respiratory: Negative.  Negative for cough, hemoptysis, shortness of breath and wheezing.   Cardiovascular: Negative.  Negative for chest pain, palpitations and leg swelling.  Gastrointestinal:  Negative.  Negative for abdominal pain, blood in stool, diarrhea, nausea and vomiting.  Genitourinary: Negative.  Negative for dysuria.  Musculoskeletal: Negative.  Negative for back pain.  Skin: Negative.   Neurological: Negative for dizziness, tremors, speech change, focal weakness, seizures and headaches.  Endo/Heme/Allergies: Negative.  Does not bruise/bleed easily.  Psychiatric/Behavioral: Negative.  Negative for depression, hallucinations and suicidal ideas.     PHYSICAL EXAMINATION:  GENERAL:  52 y.o.-year-old patient lying in the bed with no acute distress.  NECK:  Supple, no jugular venous distention. No thyroid enlargement, no tenderness.  LUNGS: Normal breath sounds bilaterally, no wheezing, rales,rhonchi  No use of accessory muscles of respiration.  CARDIOVASCULAR: S1, S2 normal. No murmurs, rubs, or gallops.  ABDOMEN: Soft, non-tender, non-distended. Bowel sounds present. No organomegaly or mass.  EXTREMITIES: No pedal edema, cyanosis, or clubbing.  PSYCHIATRIC: The patient is alert and oriented x 3.  SKIN: No obvious rash, lesion, or ulcer.   DATA REVIEW:   CBC Recent Labs  Lab 01/13/18 2228  WBC 11.5*  HGB 13.2  HCT 37.7  PLT 307    Chemistries  Recent Labs  Lab 01/13/18 0245   01/13/18 2228 01/15/18 0531  NA  --    < > 140 137  K  --    < > 3.0* 3.6  CL  --    < > 101 101  CO2  --    < > 26 27  GLUCOSE  --    < > 182* 122*  BUN  --    < > 8 14  CREATININE  --    < > 0.83 1.69*  CALCIUM  --    < > 9.2 8.5*  MG 1.7  --   --   --   AST  --   --  17  --   ALT  --   --  12  --   ALKPHOS  --   --  50  --   BILITOT  --   --  0.9  --    < > = values in this interval not displayed.    Cardiac Enzymes Recent Labs  Lab 01/13/18 2228  TROPONINI <0.03    Microbiology Results  @MICRORSLT48 @  RADIOLOGY:  No results found.    Allergies as of 01/15/2018      Reactions   Sulfasalazine Hives   Acetaminophen-codeine Hives   Atorvastatin Other (See Comments)   Muscle pain   Codeine Hives   Ibuprofen Nausea And Vomiting   Morphine Nausea And Vomiting   Oxycodone Hives   Propoxyphene Other (See Comments)   Reaction:  GI upset    Sulfa Antibiotics Hives   Duloxetine Rash, Other (See Comments)   Yellowing of the skin      Medication List    TAKE these medications   albuterol 108 (90 Base) MCG/ACT inhaler Commonly known as:  PROVENTIL HFA;VENTOLIN HFA Inhale 2 puffs into the lungs every 6 (six) hours as needed for wheezing or shortness of breath.   alprazolam 2 MG tablet Commonly known as:  XANAX Take 2 mg by mouth 3 (three) times daily as needed for anxiety.   cholecalciferol 1000 units tablet Commonly known as:  VITAMIN D Take 1,000 Units by mouth daily.   cloNIDine 0.1 mg/24hr patch Commonly known as:  CATAPRES - Dosed in mg/24 hr PLACE 1 PATCH (0.1 MG TOTAL) ONTO THE SKIN ONCE A WEEK.   FLUoxetine 20 MG capsule Commonly known as:  PROZAC Take  1 capsule by mouth daily.   fluticasone 50 MCG/ACT nasal spray Commonly known as:  FLONASE Place 2 sprays into both nostrils daily.   furosemide 20 MG tablet Commonly known as:  LASIX Take 1 tablet (20 mg total) by mouth daily as needed for edema.   lamoTRIgine 100 MG tablet Commonly known  as:  LAMICTAL Take 1 tablet by mouth daily.   lisinopril 20 MG tablet Commonly known as:  PRINIVIL,ZESTRIL Take 1 tablet (20 mg total) by mouth daily.   loratadine 10 MG tablet Commonly known as:  CLARITIN Take 1 tablet (10 mg total) by mouth daily.   metFORMIN 500 MG tablet Commonly known as:  GLUCOPHAGE Take 1 tablet (500 mg total) by mouth 2 (two) times daily with a meal.   metoCLOPramide 10 MG tablet Commonly known as:  REGLAN Take 1 tablet (10 mg total) by mouth 3 (three) times daily with meals.   montelukast 10 MG tablet Commonly known as:  SINGULAIR TAKE 1 TABLET BY MOUTH EVERYDAY AT BEDTIME   Oxycodone HCl 10 MG Tabs Take 1 tablet (10 mg total) by mouth every 6 (six) hours as needed (pain).   pantoprazole 40 MG tablet Commonly known as:  PROTONIX TAKE 1 TABLET BY MOUTH TWICE A DAY   promethazine 12.5 MG tablet Commonly known as:  PHENERGAN Take 1 tablet (12.5 mg total) by mouth every 6 (six) hours as needed for refractory nausea / vomiting.   protein supplement shake Liqd Commonly known as:  PREMIER PROTEIN Take 325 mLs (11 oz total) by mouth 3 (three) times daily between meals.   ranitidine 150 MG tablet Commonly known as:  ZANTAC Take 1 tablet (150 mg total) by mouth at bedtime.   rosuvastatin 10 MG tablet Commonly known as:  CRESTOR Take 1 tablet (10 mg total) by mouth daily.   tiZANidine 4 MG tablet Commonly known as:  ZANAFLEX Take 4 mg by mouth 3 (three) times daily. 1-3 tablets per day as tolerated per Dr. Metta Clines   traZODone 100 MG tablet Commonly known as:  DESYREL Take 100 mg by mouth at bedtime.   vitamin B-12 1000 MCG tablet Commonly known as:  CYANOCOBALAMIN Take 1,000 mcg by mouth daily.   vitamin C 500 MG tablet Commonly known as:  ASCORBIC ACID Take 500 mg by mouth daily.   zolpidem 10 MG tablet Commonly known as:  AMBIEN Take 1 tablet by mouth at bedtime as needed.          Management plans discussed with the patient and  she is in agreement. Stable for discharge home  Patient should follow up with pcp  CODE STATUS:     Code Status Orders  (From admission, onward)        Start     Ordered   01/14/18 1208  Full code  Continuous     01/14/18 1207    Code Status History    Date Active Date Inactive Code Status Order ID Comments User Context   11/29/2017 0111 11/30/2017 1813 Full Code [id number]  Oralia Manis, MD Inpatient   10/02/2017 1226 10/03/2017 1959 Full Code [id number]  Shaune Pollack, MD Inpatient   03/16/2017 0454 03/18/2017 1637 Full Code [id number]  Ihor Austin, MD Inpatient   01/03/2017 0824 01/05/2017 1557 Full Code [id number]  Ihor Austin, MD Inpatient   03/23/2016 0629 03/26/2016 1651 Full Code [id number]  Arnaldo Natal, MD Inpatient   09/28/2015 0454 09/29/2015 1720 Full Code [id number]  Ihor Austin, MD ED   05/05/2015  2036 05/09/2015 2006 Full Code [id number]  Wyatt HasteHower, David K, MD ED   01/07/2015 1911 01/09/2015 1855 Full Code [id number]  Milagros LollSudini, Srikar, MD ED      TOTAL TIME TAKING CARE OF THIS PATIENT: 38 minutes.    Note: This dictation was prepared with Dragon dictation along with smaller phrase technology. Any transcriptional errors that result from this process are unintentional.  Elfida Shimada M.D on 01/15/2018 at 10:10 AM  Between 7am to 6pm - Pager - [phone] After 6pm go to [email or web address] - Social research officer, [email or web address] [email or web address]  [facility]  Office  [phone]  CC: Primary care physician; Malva LimesFisher, Donald E, MD

## 2018-01-15 NOTE — Progress Notes (Signed)
Chaplain responded to a RR. Pt alert and attentive. Chaplain held Pt's hand while team assessed Pt. No family present. Vitals stabilized. Chaplain was released.    01/15/18 0000  Clinical Encounter Type  Visited With Patient  Visit Type Code  Referral From Chaplain  Spiritual Encounters  Spiritual Needs Prayer

## 2018-01-15 NOTE — Progress Notes (Signed)
BP improving bolus continued..76.58

## 2018-01-16 ENCOUNTER — Telehealth: Payer: Self-pay

## 2018-01-16 DIAGNOSIS — M5136 Other intervertebral disc degeneration, lumbar region: Secondary | ICD-10-CM | POA: Diagnosis not present

## 2018-01-16 DIAGNOSIS — Z5181 Encounter for therapeutic drug level monitoring: Secondary | ICD-10-CM | POA: Diagnosis not present

## 2018-01-16 DIAGNOSIS — M5481 Occipital neuralgia: Secondary | ICD-10-CM | POA: Diagnosis not present

## 2018-01-16 DIAGNOSIS — M5030 Other cervical disc degeneration, unspecified cervical region: Secondary | ICD-10-CM | POA: Diagnosis not present

## 2018-01-16 NOTE — Telephone Encounter (Signed)
Can change the 8-12 appointment to hospital follow up and will reschedule physical. Medicare requires the hospital follow up be dedicated to hospital follow up and not combined with other services

## 2018-01-16 NOTE — Telephone Encounter (Signed)
Transition Care Management Follow-up Telephone Call  [facility]  How have you been since you were released from the hospital? Doing great, no current s/s.   Any questions or concerns? No   Items Reviewed:  Did the pt receive and understand the discharge instructions provided? Yes   Medications obtained and verified? No, will verify at tomorrow apt per pt request.  Any new allergies since your discharge? No   Dietary orders reviewed? Yes  Do you have support at home? Yes   Other (ie: DME, Home Health, etc) N/A  Functional Questionnaire: (I = Independent and D = Dependent) ADL's: I  Bathing/Dressing- I   Meal Prep- I  Eating- I  Maintaining continence- I  Transferring/Ambulation- I  Managing Meds- I   Follow up appointments reviewed:    PCP Hospital f/u appt confirmed? No, pt would like to know if she can have her hospital f/u at the same time as her CPE on 01/23/18.   Specialist Hospital f/u appt confirmed? N/A   Are transportation arrangements needed? No   If their condition worsens, is the pt aware to call  their PCP or go to the ED? Yes  Was the patient provided with contact information for the PCP's office or ED? Yes  Was the pt encouraged to call back with questions or concerns? Yes

## 2018-01-16 NOTE — Telephone Encounter (Signed)
Please advise 

## 2018-01-17 ENCOUNTER — Ambulatory Visit (INDEPENDENT_AMBULATORY_CARE_PROVIDER_SITE_OTHER): Payer: Medicare Other

## 2018-01-17 VITALS — BP 108/58 | HR 98 | Temp 99.4°F | Ht 64.0 in | Wt 290.0 lb

## 2018-01-17 DIAGNOSIS — Z0000 Encounter for general adult medical examination without abnormal findings: Secondary | ICD-10-CM

## 2018-01-17 LAB — HIV ANTIBODY (ROUTINE TESTING W REFLEX): HIV Screen 4th Generation wRfx: NONREACTIVE

## 2018-01-17 LAB — LAMOTRIGINE LEVEL: Lamotrigine Lvl: 2.2 ug/mL (ref 2.0–20.0)

## 2018-01-17 NOTE — Patient Instructions (Signed)
Daisy [name] , Thank you for taking time to come for your Medicare Wellness Visit. I appreciate your ongoing commitment to your health goals. Please review the following plan we discussed and let me know if I can assist you in the future.   Screening recommendations/referrals: Colonoscopy: N/A Mammogram: N/A Bone Density: N/A Recommended yearly ophthalmology/optometry visit for glaucoma screening and checkup Recommended yearly dental visit for hygiene and checkup  Vaccinations: Influenza vaccine: Up to date Pneumococcal vaccine: N/A Tdap vaccine: Pt declines today.  Shingles vaccine: N/A    Advanced directives: Advance directive discussed with you today. Even though you declined this today please call our office should you change your mind and we can give you the proper paperwork for you to fill out.  Conditions/risks identified: Obesity- recommend to cut back on fried meats and food and focus on lean, broiled or baked meats.   Next appointment: 01/23/18 @ 9 AM with DR Caryn Section.   Preventive Care 40-64 Years, Female Preventive care refers to lifestyle choices and visits with your health care provider that can promote health and wellness. What does preventive care include?  A yearly physical exam. This is also called an annual well check.  Dental exams once or twice a year.  Routine eye exams. Ask your health care provider how often you should have your eyes checked.  Personal lifestyle choices, including:  Daily care of your teeth and gums.  Regular physical activity.  Eating a healthy diet.  Avoiding tobacco and drug use.  Limiting alcohol use.  Practicing safe sex.  Taking low-dose aspirin daily starting at age 63.  Taking vitamin and mineral supplements as recommended by your health care provider. What happens during an annual well check? The services and screenings done by your health care provider during your annual well check will depend on your age, overall  health, lifestyle risk factors, and family history of disease. Counseling  Your health care provider may ask you questions about your:  Alcohol use.  Tobacco use.  Drug use.  Emotional well-being.  Home and relationship well-being.  Sexual activity.  Eating habits.  Work and work Statistician.  Method of birth control.  Menstrual cycle.  Pregnancy history. Screening  You may have the following tests or measurements:  Height, weight, and BMI.  Blood pressure.  Lipid and cholesterol levels. These may be checked every 5 years, or more frequently if you are over 77 years old.  Skin check.  Lung cancer screening. You may have this screening every year starting at age 58 if you have a 30-pack-year history of smoking and currently smoke or have quit within the past 15 years.  Fecal occult blood test (FOBT) of the stool. You may have this test every year starting at age 47.  Flexible sigmoidoscopy or colonoscopy. You may have a sigmoidoscopy every 5 years or a colonoscopy every 10 years starting at age 56.  Hepatitis C blood test.  Hepatitis B blood test.  Sexually transmitted disease (STD) testing.  Diabetes screening. This is done by checking your blood sugar (glucose) after you have not eaten for a while (fasting). You may have this done every 1-3 years.  Mammogram. This may be done every 1-2 years. Talk to your health care provider about when you should start having regular mammograms. This may depend on whether you have a family history of breast cancer.  BRCA-related cancer screening. This may be done if you have a family history of breast, ovarian, tubal, or peritoneal cancers.  Pelvic exam and Pap test. This may be done every 3 years starting at age 40. Starting at age 59, this may be done every 5 years if you have a Pap test in combination with an HPV test.  Bone density scan. This is done to screen for osteoporosis. You may have this scan if you are at high  risk for osteoporosis. Discuss your test results, treatment options, and if necessary, the need for more tests with your health care provider. Vaccines  Your health care provider may recommend certain vaccines, such as:  Influenza vaccine. This is recommended every year.  Tetanus, diphtheria, and acellular pertussis (Tdap, Td) vaccine. You may need a Td booster every 10 years.  Zoster vaccine. You may need this after age 15.  Pneumococcal 13-valent conjugate (PCV13) vaccine. You may need this if you have certain conditions and were not previously vaccinated.  Pneumococcal polysaccharide (PPSV23) vaccine. You may need one or two doses if you smoke cigarettes or if you have certain conditions. Talk to your health care provider about which screenings and vaccines you need and how often you need them. This information is not intended to replace advice given to you by your health care provider. Make sure you discuss any questions you have with your health care provider. Document Released: 06/27/2015 Document Revised: 02/18/2016 Document Reviewed: 04/01/2015 Elsevier Interactive Patient Education  2017 [employer] Prevention in the Home Falls can cause injuries. They can happen to people of all ages. There are many things you can do to make your home safe and to help prevent falls. What can I do on the outside of my home?  Regularly fix the edges of walkways and driveways and fix any cracks.  Remove anything that might make you trip as you walk through a door, such as a raised step or threshold.  Trim any bushes or trees on the path to your home.  Use bright outdoor lighting.  Clear any walking paths of anything that might make someone trip, such as rocks or tools.  Regularly check to see if handrails are loose or broken. Make sure that both sides of any steps have handrails.  Any raised decks and porches should have guardrails on the edges.  Have any leaves, snow, or ice  cleared regularly.  Use sand or salt on walking paths during winter.  Clean up any spills in your garage right away. This includes oil or grease spills. What can I do in the bathroom?  Use night lights.  Install grab bars by the toilet and in the tub and shower. Do not use towel bars as grab bars.  Use non-skid mats or decals in the tub or shower.  If you need to sit down in the shower, use a plastic, non-slip stool.  Keep the floor dry. Clean up any water that spills on the floor as soon as it happens.  Remove soap buildup in the tub or shower regularly.  Attach bath mats securely with double-sided non-slip rug tape.  Do not have throw rugs and other things on the floor that can make you trip. What can I do in the bedroom?  Use night lights.  Make sure that you have a light by your bed that is easy to reach.  Do not use any sheets or blankets that are too big for your bed. They should not hang down onto the floor.  Have a firm chair that has side arms. You can use this for  support while you get dressed.  Do not have throw rugs and other things on the floor that can make you trip. What can I do in the kitchen?  Clean up any spills right away.  Avoid walking on wet floors.  Keep items that you use a lot in easy-to-reach places.  If you need to reach something above you, use a strong step stool that has a grab bar.  Keep electrical cords out of the way.  Do not use floor polish or wax that makes floors slippery. If you must use wax, use non-skid floor wax.  Do not have throw rugs and other things on the floor that can make you trip. What can I do with my stairs?  Do not leave any items on the stairs.  Make sure that there are handrails on both sides of the stairs and use them. Fix handrails that are broken or loose. Make sure that handrails are as long as the stairways.  Check any carpeting to make sure that it is firmly attached to the stairs. Fix any carpet that  is loose or worn.  Avoid having throw rugs at the top or bottom of the stairs. If you do have throw rugs, attach them to the floor with carpet tape.  Make sure that you have a light switch at the top of the stairs and the bottom of the stairs. If you do not have them, ask someone to add them for you. What else can I do to help prevent falls?  Wear shoes that:  Do not have high heels.  Have rubber bottoms.  Are comfortable and fit you well.  Are closed at the toe. Do not wear sandals.  If you use a stepladder:  Make sure that it is fully opened. Do not climb a closed stepladder.  Make sure that both sides of the stepladder are locked into place.  Ask someone to hold it for you, if possible.  Clearly mark and make sure that you can see:  Any grab bars or handrails.  First and last steps.  Where the edge of each step is.  Use tools that help you move around (mobility aids) if they are needed. These include:  Canes.  Walkers.  Scooters.  Crutches.  Turn on the lights when you go into a dark area. Replace any light bulbs as soon as they burn out.  Set up your furniture so you have a clear path. Avoid moving your furniture around.  If any of your floors are uneven, fix them.  If there are any pets around you, be aware of where they are.  Review your medicines with your doctor. Some medicines can make you feel dizzy. This can increase your chance of falling. Ask your doctor what other things that you can do to help prevent falls. This information is not intended to replace advice given to you by your health care provider. Make sure you discuss any questions you have with your health care provider. Document Released: 03/27/2009 Document Revised: 11/06/2015 Document Reviewed: 07/05/2014 Elsevier Interactive Patient Education  2017 [employer].

## 2018-01-17 NOTE — Telephone Encounter (Signed)
Pt agreed to change apt. CPE has been changed to a hospital f/u on 01/23/18 @ 9 AM. -MM

## 2018-01-17 NOTE — Progress Notes (Signed)
Subjective:   Daisy [name] is a 52 y.o. female who presents for Medicare Annual (Subsequent) preventive examination.  Review of Systems:  N/A  Cardiac Risk Factors include: advanced age (>27men, >51 women);diabetes mellitus;dyslipidemia;hypertension;obesity (BMI >30kg/m2)     Objective:     Vitals: BP (!) 108/58 (BP Location: Right Arm)   Pulse 98   Temp 99.4 F (37.4 C) (Oral)   Ht 5\' 4"  (1.626 m)   Wt 290 lb (131.5 kg)   BMI 49.78 kg/m   Body mass index is 49.78 kg/m.  Advanced Directives 01/14/2018 01/13/2018 11/29/2017 10/02/2017 07/18/2017 07/18/2017 03/16/2017  Does Patient Have a Medical Advance Directive? No No No No No No No  Does patient want to make changes to medical advance directive? - - - - - - -  Would patient like information on creating a medical advance directive? No - Patient declined No - Patient declined No - Patient declined Yes (Inpatient - patient requests chaplain consult to create a medical advance directive) - No - Patient declined Yes (Inpatient - patient defers creating a medical advance directive at this time)    Tobacco Social History   Tobacco Use  Smoking Status Never Smoker  Smokeless Tobacco Former Neurosurgeon  . Types: Snuff  Tobacco Comment   quit > 30 years ago     Counseling given: Not Answered Comment: quit > 30 years ago   Clinical Intake:  Pre-visit preparation completed: Yes  Pain : 0-10 Pain Score: 5  Pain Type: Chronic pain Pain Location: Abdomen Pain Orientation: Mid Pain Descriptors / Indicators: Radiating, Aching, Dull Pain Frequency: Constant     Nutritional Status: BMI > 30  Obese Nutritional Risks: None Diabetes: Yes(type 2) CBG done?: No Did pt. bring in CBG monitor from home?: No  How often do you need to have someone help you when you read instructions, pamphlets, or other written materials from your doctor or pharmacy?: 1 - Never  Interpreter Needed?: No  Information entered by :: [facility], LPN  Past  Medical History:  Diagnosis Date  . Diabetes mellitus without complication (HCC)   . Gastritis   . Gastroparesis   . Hernia of abdominal wall   . Hypertension   . Migraine    Past Surgical History:  Procedure Laterality Date  . ABDOMINAL HYSTERECTOMY    . APPENDECTOMY    . APPLICATION OF WOUND VAC    . CHOLECYSTECTOMY    . HERNIA REPAIR    . KNEE SURGERY     Family History  Problem Relation Age of Onset  . Arthritis Mother   . Depression Mother   . Diabetes Mother   . Heart disease Mother   . Hyperlipidemia Mother   . Hypertension Mother   . Stroke Mother   . Arthritis Father   . Diabetes Father   . Hearing loss Father   . Heart disease Father   . Hyperlipidemia Father   . Hypertension Father    Social History   Socioeconomic History  . Marital status: Single    Spouse name: Not on file  . Number of children: 2  . Years of education: Not on file  . Highest education level: Some college, no degree  Occupational History  . Occupation: disabled  Social Needs  . Financial resource strain: Somewhat hard  . Food insecurity:    Worry: Sometimes true    Inability: Sometimes true  . Transportation needs:    Medical: No    Non-medical: No  Tobacco  Use  . Smoking status: Never Smoker  . Smokeless tobacco: Former Neurosurgeon    Types: Snuff  . Tobacco comment: quit > 30 years ago  Substance and Sexual Activity  . Alcohol use: Yes    Alcohol/week: 0.0 oz    Comment: only on special occasions- wine  . Drug use: No  . Sexual activity: Not on file  Lifestyle  . Physical activity:    Days per week: Not on file    Minutes per session: Not on file  . Stress: Very much  Relationships  . Social connections:    Talks on phone: Not on file    Gets together: Not on file    Attends religious service: Not on file    Active member of club or organization: Not on file    Attends meetings of clubs or organizations: Not on file    Relationship status: Not on file  Other Topics  Concern  . Not on file  Social History Narrative  . Not on file    Outpatient Encounter Medications as of 01/17/2018  Medication Sig  . albuterol (PROVENTIL HFA;VENTOLIN HFA) 108 (90 Base) MCG/ACT inhaler Inhale 2 puffs into the lungs every 6 (six) hours as needed for wheezing or shortness of breath.  . alprazolam (XANAX) 2 MG tablet Take 2 mg by mouth 3 (three) times daily as needed for anxiety.   . cholecalciferol (VITAMIN D) 1000 units tablet Take 1,000 Units by mouth daily.  . cloNIDine (CATAPRES - DOSED IN MG/24 HR) 0.1 mg/24hr patch PLACE 1 PATCH (0.1 MG TOTAL) ONTO THE SKIN ONCE A WEEK.  Marland Kitchen FLUoxetine (PROZAC) 20 MG capsule Take 1 capsule by mouth daily.  . fluticasone (FLONASE) 50 MCG/ACT nasal spray Place 2 sprays into both nostrils daily.  . furosemide (LASIX) 20 MG tablet Take 1 tablet (20 mg total) by mouth daily as needed for edema.  Marland Kitchen lamoTRIgine (LAMICTAL) 100 MG tablet Take 1 tablet by mouth daily.  Marland Kitchen lisinopril (PRINIVIL,ZESTRIL) 20 MG tablet Take 1 tablet (20 mg total) by mouth daily.  . metFORMIN (GLUCOPHAGE) 500 MG tablet Take 1 tablet (500 mg total) by mouth 2 (two) times daily with a meal.  . metoCLOPramide (REGLAN) 10 MG tablet Take 1 tablet (10 mg total) by mouth 3 (three) times daily with meals.  . Oxycodone HCl 10 MG TABS Take 1 tablet (10 mg total) by mouth every 6 (six) hours as needed (pain).  . pantoprazole (PROTONIX) 40 MG tablet TAKE 1 TABLET BY MOUTH TWICE A DAY  . promethazine (PHENERGAN) 12.5 MG tablet Take 1 tablet (12.5 mg total) by mouth every 6 (six) hours as needed for refractory nausea / vomiting.  . protein supplement shake (PREMIER PROTEIN) LIQD Take 325 mLs (11 oz total) by mouth 3 (three) times daily between meals.  . ranitidine (ZANTAC) 150 MG tablet Take 1 tablet (150 mg total) by mouth at bedtime.  . rosuvastatin (CRESTOR) 10 MG tablet Take 1 tablet (10 mg total) by mouth daily.  Marland Kitchen tiZANidine (ZANAFLEX) 4 MG tablet Take 4 mg by mouth 3 (three)  times daily. 1-3 tablets per day as tolerated per Dr. Metta Clines  . traZODone (DESYREL) 100 MG tablet Take 100 mg by mouth at bedtime.  . vitamin B-12 (CYANOCOBALAMIN) 1000 MCG tablet Take 1,000 mcg by mouth daily.  . vitamin C (ASCORBIC ACID) 500 MG tablet Take 500 mg by mouth daily.  Marland Kitchen zolpidem (AMBIEN) 10 MG tablet Take 1 tablet by mouth at bedtime as needed.  Marland Kitchen  loratadine (CLARITIN) 10 MG tablet Take 1 tablet (10 mg total) by mouth daily. (Patient not taking: Reported on 01/14/2018)  . montelukast (SINGULAIR) 10 MG tablet TAKE 1 TABLET BY MOUTH EVERYDAY AT BEDTIME (Patient not taking: Reported on 01/14/2018)   No facility-administered encounter medications on file as of 01/17/2018.     Activities of Daily Living In your present state of health, do you have any difficulty performing the following activities: 01/17/2018 01/14/2018  Hearing? N N  Vision? N N  Difficulty concentrating or making decisions? Y N  Walking or climbing stairs? Y N  Comment Due to abdominal and right leg pain.  -  Dressing or bathing? N N  Doing errands, shopping? N N  Preparing Food and eating ? N -  Using the Toilet? N -  In the past six months, have you accidently leaked urine? Y -  Comment Occasionally, wears pads daily.  -  Do you have problems with loss of bowel control? N -  Managing your Medications? N -  Managing your Finances? N -  Housekeeping or managing your Housekeeping? N -  Some recent data might be hidden    Patient Care Team: Malva LimesFisher, Donald E, MD as PCP - General (Family Medicine) Lynett FishSu, Hansen MD as Referring Physician (Psychiatry) Ewing Schleinrisp, Gregory, MD as Attending Physician (Pain Medicine) Salvatore MarvelWainer, Robert, MD as Consulting Physician (Orthopedic Surgery)    Assessment:   This is a routine wellness examination for Jozey.  Exercise Activities and Dietary recommendations Current Exercise Habits: The patient does not participate in regular exercise at present, Exercise limited by: Other - see  comments(current hernia issues and pain)  Goals    . DIET - INCREASE LEAN PROTEINS     Recommend to cut back on fried meats and food and focus on lean, broiled or baked meats.        Fall Risk Fall Risk  01/17/2018 11/12/2016 02/05/2016 12/11/2015 11/13/2015  Falls in the past year? No Yes No (No Data) No  Comment - - - no falls since last visit -  Number falls in past yr: - 1 - - -  Comment - - - - -  Injury with Fall? - No - - -  Comment - - - - -  Risk for fall due to : - History of fall(s);Other (Comment) - - -  Risk for fall due to: Comment - - - - -  Follow up - Falls prevention discussed - - -   Is the patient's home free of loose throw rugs in walkways, pet beds, electrical cords, etc?   yes      Grab bars in the bathroom? yes      Handrails on the stairs?   no      Adequate lighting?   yes  Timed Get Up and Go performed: N/A  Depression Screen PHQ 2/9 Scores 01/17/2018 11/12/2016 02/05/2016 12/11/2015  PHQ - 2 Score 4 6 - -  PHQ- 9 Score 14 22 - -  Exception Documentation - - Patient refusal Patient refusal     Cognitive Function: Pt declined screening today.      6CIT Screen 11/12/2016  What Year? 0 points  What month? 0 points  What time? 0 points  Count back from 20 0 points  Months in reverse 0 points  Repeat phrase 4 points  Total Score 4    Immunization History  Administered Date(s) Administered  . Influenza,inj,Quad PF,6+ Mos 04/15/2017  . Influenza-Unspecified 06/03/2009  . Pneumococcal Polysaccharide-23  07/17/2012    Qualifies for Shingles Vaccine? N/A  Screening Tests Health Maintenance  Topic Date Due  . OPHTHALMOLOGY EXAM  01/26/1978  . TETANUS/TDAP  01/27/1987  . FOOT EXAM  06/18/2017  . INFLUENZA VACCINE  01/12/2018  . PNEUMOCOCCAL POLYSACCHARIDE VACCINE (2) 01/26/2033 (Originally 07/17/2017)  . HEMOGLOBIN A1C  04/03/2018  . HIV Screening  Completed    Cancer Screenings: Lung: Low Dose CT Chest recommended if Age 58-80 years, 30 pack-year  currently smoking OR have quit w/in 15years. Patient does not qualify. Breast:  Up to date on Mammogram? N/A  Up to date of Bone Density/Dexa? N/A Colorectal: N/A  Additional Screenings:  Hepatitis C Screening: N/A     Plan:  I have personally reviewed and addressed the Medicare Annual Wellness questionnaire and have noted the following in the patient's chart:  A. Medical and social history B. Use of alcohol, tobacco or illicit drugs  C. Current medications and supplements D. Functional ability and status E.  Nutritional status F.  Physical activity G. Advance directives H. List of other physicians I.  Hospitalizations, surgeries, and ER visits in previous 12 months J.  Vitals K. Screenings such as hearing and vision if needed, cognitive and depression L. Referrals and appointments - none  In addition, I have reviewed and discussed with patient certain preventive protocols, quality metrics, and best practice recommendations. A written personalized care plan for preventive services as well as general preventive health recommendations were provided to patient.  See attached scanned questionnaire for additional information.   Signed,  Hyacinth Meeker, LPN Nurse Health Advisor   Nurse Recommendations: Pt needs a diabetic foot exam at next OV. Pt declined the tetanus vaccine today.

## 2018-01-17 NOTE — Telephone Encounter (Signed)
Noted. Thanks.

## 2018-01-18 ENCOUNTER — Other Ambulatory Visit: Payer: Self-pay | Admitting: Family Medicine

## 2018-01-18 DIAGNOSIS — I10 Essential (primary) hypertension: Secondary | ICD-10-CM

## 2018-01-20 ENCOUNTER — Other Ambulatory Visit: Payer: Self-pay | Admitting: Family Medicine
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
## 2018-01-23 ENCOUNTER — Encounter: Payer: Self-pay | Admitting: Family Medicine

## 2018-01-23 ENCOUNTER — Ambulatory Visit (INDEPENDENT_AMBULATORY_CARE_PROVIDER_SITE_OTHER): Payer: Medicare Other | Admitting: Family Medicine

## 2018-01-23 VITALS — BP 131/92 | HR 94 | Temp 98.5°F | Resp 18 | Wt 296.0 lb

## 2018-01-23 DIAGNOSIS — K3184 Gastroparesis: Secondary | ICD-10-CM

## 2018-01-23 DIAGNOSIS — N179 Acute kidney failure, unspecified: Secondary | ICD-10-CM

## 2018-01-23 DIAGNOSIS — E1143 Type 2 diabetes mellitus with diabetic autonomic (poly)neuropathy: Secondary | ICD-10-CM | POA: Diagnosis not present

## 2018-01-23 DIAGNOSIS — R252 Cramp and spasm: Secondary | ICD-10-CM

## 2018-01-23 NOTE — Patient Instructions (Signed)
.   Please go to the lab draw station in [address] on the second floor of [facility]  

## 2018-01-27 ENCOUNTER — Other Ambulatory Visit: Payer: Self-pay | Admitting: *Deleted

## 2018-01-27 ENCOUNTER — Encounter: Payer: Self-pay | Admitting: *Deleted

## 2018-01-27 NOTE — Patient Outreach (Signed)
[facility] Care Management  01/27/2018  Juanetta GoslingDanita A Euceda 02-17-1966 [id number]  Referral via RED Alert-EMMI-General Discharge; Day# 4, 01/20/2018: Reason: Got discharge paper-no Know who to call about changes?-no  Telephone to patient; line busy x 2; unable to leave message.  Plan: Furniture conservator/restorerend outreach letter. Follow up 2-4 business days.  Colleen CanLinda Viveka Wilmeth, RN BSN CCM Care Management Coordinator [facility]  [phone]

## 2018-01-31 ENCOUNTER — Other Ambulatory Visit: Payer: Self-pay | Admitting: *Deleted

## 2018-01-31 NOTE — Patient Outreach (Signed)
[facility]) Care Management  01/31/2018  Daisy GoslingDanita A Grzywacz 1965-06-27 [id number]  Referral via RED Alert-EMMI-General Discharge; Day# 4, 01/20/2018: Reason: Got discharge paper-no Know who to call about changes?-no  Telephone call #2 to patient; person who answered call advised that patient was not available.  Plan: Will follow up 2-4 days. Outreach letter was sent 01/27/2018.  Colleen CanLinda Beaux Wedemeyer, RN BSN CCM Care Management Coordinator [facility]  [phone]

## 2018-02-03 ENCOUNTER — Other Ambulatory Visit: Payer: Self-pay | Admitting: *Deleted

## 2018-02-03 NOTE — Patient Outreach (Signed)
[facility] Care Management  02/03/2018  Daisy [name] August 03, 1965 [id number]  Referral via RED Alert-EMMI-General Discharge; Day# 4, 01/20/2018: Reason: Got discharge paper-no Know who to call about changes?-no  Telephone call #3 to patient who was advised of reason for call. Patient voices that she has not received any automated calls. HIPPA verification received.  Patient voices that she received discharge papers when she was discharged. States she has seen primary care provider for follow up visit on 01/17/2018 & has seen pain specialist for her chronic pain. Voices she knows to call her MD for abnormal symptoms.   States she had no problems getting medications and is taking them as prescribed by her MD.  States she has had no nausea or vomiting since discharge. Voices she has no further concerns at this time.  EMMI call completed.  Plan: Case closure.  Colleen CanLinda Ellah Otte, RN BSN CCM Care Management Coordinator [facility]  [phone]

## 2018-02-14 DIAGNOSIS — M5030 Other cervical disc degeneration, unspecified cervical region: Secondary | ICD-10-CM | POA: Diagnosis not present

## 2018-02-14 DIAGNOSIS — Z5181 Encounter for therapeutic drug level monitoring: Secondary | ICD-10-CM | POA: Diagnosis not present

## 2018-02-14 DIAGNOSIS — M5481 Occipital neuralgia: Secondary | ICD-10-CM | POA: Diagnosis not present

## 2018-02-14 DIAGNOSIS — M5136 Other intervertebral disc degeneration, lumbar region: Secondary | ICD-10-CM | POA: Diagnosis not present

## 2018-02-17 ENCOUNTER — Telehealth: Payer: Self-pay | Admitting: Family Medicine

## 2018-02-17 NOTE — Telephone Encounter (Signed)
No answer and no vm

## 2018-02-17 NOTE — Telephone Encounter (Signed)
-----   Message from Daisy [name], [place] sent at 02/17/2018  8:09 AM EDT ----- Mrs. Lozoya wanted to know if you will take her son as a new patient. Please advise?   Caren Macadam

## 2018-02-17 NOTE — Telephone Encounter (Signed)
That's fine

## 2018-03-13 DIAGNOSIS — M5136 Other intervertebral disc degeneration, lumbar region: Secondary | ICD-10-CM | POA: Diagnosis not present

## 2018-03-13 DIAGNOSIS — M5030 Other cervical disc degeneration, unspecified cervical region: Secondary | ICD-10-CM | POA: Diagnosis not present

## 2018-03-13 DIAGNOSIS — M5481 Occipital neuralgia: Secondary | ICD-10-CM | POA: Diagnosis not present

## 2018-03-13 DIAGNOSIS — Z5181 Encounter for therapeutic drug level monitoring: Secondary | ICD-10-CM | POA: Diagnosis not present

## 2018-03-14 DIAGNOSIS — Z79899 Other long term (current) drug therapy: Secondary | ICD-10-CM | POA: Diagnosis not present

## 2018-03-23 ENCOUNTER — Other Ambulatory Visit: Payer: Self-pay | Admitting: Family Medicine

## 2018-04-10 DIAGNOSIS — Z5181 Encounter for therapeutic drug level monitoring: Secondary | ICD-10-CM | POA: Diagnosis not present

## 2018-04-10 DIAGNOSIS — M5481 Occipital neuralgia: Secondary | ICD-10-CM | POA: Diagnosis not present

## 2018-04-10 DIAGNOSIS — M5136 Other intervertebral disc degeneration, lumbar region: Secondary | ICD-10-CM | POA: Diagnosis not present

## 2018-04-10 DIAGNOSIS — M5030 Other cervical disc degeneration, unspecified cervical region: Secondary | ICD-10-CM | POA: Diagnosis not present

## 2018-04-16 ENCOUNTER — Other Ambulatory Visit: Payer: Self-pay

## 2018-04-16 ENCOUNTER — Observation Stay
Admission: EM | Admit: 2018-04-16 | Discharge: 2018-04-17 | Disposition: A | Discharge status: Home / Self Care | Payer: Medicare Other | Attending: Internal Medicine | Admitting: Internal Medicine

## 2018-04-16 DIAGNOSIS — E785 Hyperlipidemia, unspecified: Secondary | ICD-10-CM | POA: Insufficient documentation

## 2018-04-16 DIAGNOSIS — M25569 Pain in unspecified knee: Secondary | ICD-10-CM | POA: Insufficient documentation

## 2018-04-16 DIAGNOSIS — E1143 Type 2 diabetes mellitus with diabetic autonomic (poly)neuropathy: Secondary | ICD-10-CM | POA: Diagnosis not present

## 2018-04-16 DIAGNOSIS — Z7951 Long term (current) use of inhaled steroids: Secondary | ICD-10-CM | POA: Insufficient documentation

## 2018-04-16 DIAGNOSIS — F419 Anxiety disorder, unspecified: Secondary | ICD-10-CM | POA: Insufficient documentation

## 2018-04-16 DIAGNOSIS — K219 Gastro-esophageal reflux disease without esophagitis: Secondary | ICD-10-CM | POA: Diagnosis not present

## 2018-04-16 DIAGNOSIS — G8929 Other chronic pain: Secondary | ICD-10-CM | POA: Diagnosis not present

## 2018-04-16 DIAGNOSIS — Z79891 Long term (current) use of opiate analgesic: Secondary | ICD-10-CM | POA: Diagnosis not present

## 2018-04-16 DIAGNOSIS — Z6841 Body Mass Index (BMI) 40.0 and over, adult: Secondary | ICD-10-CM | POA: Diagnosis not present

## 2018-04-16 DIAGNOSIS — J309 Allergic rhinitis, unspecified: Secondary | ICD-10-CM | POA: Insufficient documentation

## 2018-04-16 DIAGNOSIS — R109 Unspecified abdominal pain: Secondary | ICD-10-CM | POA: Diagnosis present

## 2018-04-16 DIAGNOSIS — R112 Nausea with vomiting, unspecified: Secondary | ICD-10-CM | POA: Diagnosis not present

## 2018-04-16 DIAGNOSIS — I10 Essential (primary) hypertension: Secondary | ICD-10-CM | POA: Insufficient documentation

## 2018-04-16 DIAGNOSIS — M549 Dorsalgia, unspecified: Secondary | ICD-10-CM | POA: Diagnosis not present

## 2018-04-16 DIAGNOSIS — Z79899 Other long term (current) drug therapy: Secondary | ICD-10-CM | POA: Insufficient documentation

## 2018-04-16 DIAGNOSIS — F319 Bipolar disorder, unspecified: Secondary | ICD-10-CM | POA: Diagnosis not present

## 2018-04-16 DIAGNOSIS — E119 Type 2 diabetes mellitus without complications: Secondary | ICD-10-CM | POA: Diagnosis not present

## 2018-04-16 DIAGNOSIS — R1110 Vomiting, unspecified: Secondary | ICD-10-CM | POA: Diagnosis not present

## 2018-04-16 DIAGNOSIS — K3184 Gastroparesis: Secondary | ICD-10-CM | POA: Diagnosis not present

## 2018-04-16 DIAGNOSIS — E1340 Other specified diabetes mellitus with diabetic neuropathy, unspecified: Secondary | ICD-10-CM

## 2018-04-16 DIAGNOSIS — Z794 Long term (current) use of insulin: Secondary | ICD-10-CM | POA: Insufficient documentation

## 2018-04-16 DIAGNOSIS — Z23 Encounter for immunization: Secondary | ICD-10-CM | POA: Diagnosis not present

## 2018-04-16 LAB — CBC WITH DIFFERENTIAL/PLATELET
Abs Immature Granulocytes: 0.05 10*3/uL (ref 0.00–0.07)
BASOS ABS: 0 10*3/uL (ref 0.0–0.1)
Basophils Relative: 0 %
EOS ABS: 0.1 10*3/uL (ref 0.0–0.5)
Eosinophils Relative: 2 %
HEMATOCRIT: 41.2 % (ref 36.0–46.0)
Hemoglobin: 12.8 g/dL (ref 12.0–15.0)
IMMATURE GRANULOCYTES: 1 %
LYMPHS ABS: 3.3 10*3/uL (ref 0.7–4.0)
Lymphocytes Relative: 36 %
MCH: 27.8 pg (ref 26.0–34.0)
MCHC: 31.1 g/dL (ref 30.0–36.0)
MCV: 89.4 fL (ref 80.0–100.0)
Monocytes Absolute: 0.5 10*3/uL (ref 0.1–1.0)
Monocytes Relative: 6 %
NEUTROS PCT: 55 %
NRBC: 0 % (ref 0.0–0.2)
Neutro Abs: 5.2 10*3/uL (ref 1.7–7.7)
Platelets: 346 10*3/uL (ref 150–400)
RBC: 4.61 MIL/uL (ref 3.87–5.11)
RDW: 14.6 % (ref 11.5–15.5)
WBC: 9.3 10*3/uL (ref 4.0–10.5)

## 2018-04-16 LAB — COMPREHENSIVE METABOLIC PANEL
ALBUMIN: 4.2 g/dL (ref 3.5–5.0)
ALT: 13 U/L (ref 0–44)
AST: 19 U/L (ref 15–41)
Alkaline Phosphatase: 63 U/L (ref 38–126)
Anion gap: 8 (ref 5–15)
BILIRUBIN TOTAL: 0.6 mg/dL (ref 0.3–1.2)
BUN: 7 mg/dL (ref 6–20)
CHLORIDE: 104 mmol/L (ref 98–111)
CO2: 29 mmol/L (ref 22–32)
Calcium: 9.1 mg/dL (ref 8.9–10.3)
Creatinine, Ser: 0.7 mg/dL (ref 0.44–1.00)
GFR calc Af Amer: >60 mL/min (ref >60)
GFR calc non Af Amer: >60 mL/min (ref >60)
GLUCOSE: 119 mg/dL — AB (ref 70–99)
POTASSIUM: 4.4 mmol/L (ref 3.5–5.1)
SODIUM: 141 mmol/L (ref 135–145)
TOTAL PROTEIN: 8.1 g/dL (ref 6.5–8.1)

## 2018-04-16 LAB — HEMOGLOBIN A1C
HEMOGLOBIN A1C: 5.9 % — AB (ref 4.8–5.6)
MEAN PLASMA GLUCOSE: 122.63 mg/dL

## 2018-04-16 LAB — LIPASE, BLOOD: Lipase: 22 U/L (ref 11–51)

## 2018-04-16 LAB — TROPONIN I: Troponin I: <0.03 ng/mL (ref <0.03)

## 2018-04-16 MED ORDER — AMLODIPINE BESYLATE 5 MG PO TABS: 10.0000 mg | ORAL_TABLET | Freq: Every day | ORAL | Status: DC

## 2018-04-16 MED ORDER — METOCLOPRAMIDE HCL 5 MG/ML IJ SOLN: 10.0000 mg | Freq: Four times a day (QID) | INTRAMUSCULAR | Status: DC

## 2018-04-16 MED ORDER — METOCLOPRAMIDE HCL 5 MG/ML IJ SOLN
10.0000 mg | Freq: Once | INTRAMUSCULAR | Status: AC
  Administered 2018-04-16: 10 mg via INTRAVENOUS
  Filled 2018-04-16: qty 2

## 2018-04-16 MED ORDER — DOCUSATE SODIUM 100 MG PO CAPS
100.0000 mg | ORAL_CAPSULE | Freq: Two times a day (BID) | ORAL | Status: DC
  Administered 2018-04-17: 100 mg via ORAL
  Filled 2018-04-16: qty 1

## 2018-04-16 MED ORDER — LISINOPRIL 10 MG PO TABS
40.0000 mg | ORAL_TABLET | Freq: Every day | ORAL | Status: DC
  Filled 2018-04-16: qty 4

## 2018-04-16 MED ORDER — INSULIN ASPART 100 UNIT/ML ~~LOC~~ SOLN
0.0000 [IU] | SUBCUTANEOUS | Status: DC
  Administered 2018-04-17 (×2): 1 [IU] via SUBCUTANEOUS
  Filled 2018-04-16: qty 1

## 2018-04-16 MED ORDER — AMLODIPINE BESYLATE 5 MG PO TABS
ORAL_TABLET | ORAL | Status: AC
  Filled 2018-04-16: qty 2

## 2018-04-16 MED ORDER — PROMETHAZINE HCL 25 MG/ML IJ SOLN
25.0000 mg | Freq: Once | INTRAMUSCULAR | Status: AC
  Administered 2018-04-16: 25 mg via INTRAMUSCULAR
  Filled 2018-04-16: qty 1

## 2018-04-16 MED ORDER — ONDANSETRON HCL 4 MG/2ML IJ SOLN
4.0000 mg | Freq: Once | INTRAMUSCULAR | Status: AC
  Administered 2018-04-16: 4 mg via INTRAVENOUS
  Filled 2018-04-16: qty 2

## 2018-04-16 MED ORDER — POLYETHYLENE GLYCOL 3350 17 G PO PACK: 17.0000 g | PACK | Freq: Every day | ORAL | Status: DC | PRN

## 2018-04-16 MED ORDER — ACETAMINOPHEN 650 MG RE SUPP: 650.0000 mg | Freq: Four times a day (QID) | RECTAL | Status: DC | PRN

## 2018-04-16 MED ORDER — LISINOPRIL 10 MG PO TABS: 40.0000 mg | ORAL_TABLET | Freq: Every day | ORAL | Status: DC

## 2018-04-16 MED ORDER — HYDROMORPHONE HCL 1 MG/ML IJ SOLN
0.5000 mg | Freq: Once | INTRAMUSCULAR | Status: AC
  Administered 2018-04-16: 0.5 mg via INTRAVENOUS
  Filled 2018-04-16: qty 1

## 2018-04-16 MED ORDER — HYDROMORPHONE HCL 1 MG/ML IJ SOLN
1.0000 mg | Freq: Once | INTRAMUSCULAR | Status: AC
  Administered 2018-04-16: 1 mg via INTRAMUSCULAR
  Filled 2018-04-16: qty 1

## 2018-04-16 MED ORDER — ACETAMINOPHEN 325 MG PO TABS: 650.0000 mg | ORAL_TABLET | Freq: Four times a day (QID) | ORAL | Status: DC | PRN

## 2018-04-16 MED ORDER — ENOXAPARIN SODIUM 40 MG/0.4ML ~~LOC~~ SOLN
40.0000 mg | Freq: Two times a day (BID) | SUBCUTANEOUS | Status: DC
  Administered 2018-04-17: 40 mg via SUBCUTANEOUS
  Filled 2018-04-16: qty 0.4

## 2018-04-16 MED ORDER — CLONIDINE HCL 0.1 MG PO TABS: 0.2000 mg | ORAL_TABLET | Freq: Four times a day (QID) | ORAL | Status: DC | PRN

## 2018-04-16 MED ORDER — HYDRALAZINE HCL 20 MG/ML IJ SOLN
20.0000 mg | INTRAMUSCULAR | Status: DC | PRN
  Administered 2018-04-16: 20 mg via INTRAVENOUS
  Filled 2018-04-16: qty 1

## 2018-04-16 MED ORDER — ONDANSETRON HCL 4 MG/2ML IJ SOLN: 4.0000 mg | Freq: Four times a day (QID) | INTRAMUSCULAR | Status: DC | PRN

## 2018-04-16 MED ORDER — CLONIDINE HCL 0.1 MG PO TABS
0.2000 mg | ORAL_TABLET | Freq: Once | ORAL | Status: AC
  Administered 2018-04-16: 0.2 mg via ORAL

## 2018-04-16 MED ORDER — SODIUM CHLORIDE 0.9 % IV SOLN
Freq: Once | INTRAVENOUS | Status: AC
  Administered 2018-04-16: 18:00:00 via INTRAVENOUS

## 2018-04-16 MED ORDER — PROMETHAZINE HCL 25 MG/ML IJ SOLN: 25.0000 mg | Freq: Once | INTRAMUSCULAR | Status: DC

## 2018-04-16 MED ORDER — ONDANSETRON HCL 4 MG PO TABS
4.0000 mg | ORAL_TABLET | Freq: Four times a day (QID) | ORAL | Status: DC | PRN
  Administered 2018-04-17: 4 mg via ORAL
  Filled 2018-04-16: qty 1

## 2018-04-16 MED ORDER — AMLODIPINE BESYLATE 5 MG PO TABS
10.0000 mg | ORAL_TABLET | Freq: Every day | ORAL | Status: DC
  Administered 2018-04-16: 10 mg via ORAL
  Filled 2018-04-16: qty 2

## 2018-04-16 MED ORDER — CLONIDINE HCL 0.1 MG PO TABS
ORAL_TABLET | ORAL | Status: AC
  Filled 2018-04-16: qty 2

## 2018-04-16 MED ORDER — ALBUTEROL SULFATE (2.5 MG/3ML) 0.083% IN NEBU: 2.5000 mg | INHALATION_SOLUTION | RESPIRATORY_TRACT | Status: DC | PRN

## 2018-04-16 NOTE — ED Notes (Signed)
Pt did become sick, possibly vomiting up the clonidine tablets given.

## 2018-04-16 NOTE — Progress Notes (Signed)
In Progress  04/16/18 10:32 PM [place], Virgel Paling, RN  IV RN at bedside and attempted to start IV x4. [place] used. Per patient, previous PICC insertion d/t difficulty of stick.

## 2018-04-16 NOTE — ED Provider Notes (Signed)
Angiocath insertion Performed by: Merrily Brittle  Consent: Verbal consent obtained. Risks and benefits: risks, benefits and alternatives were discussed Time out: Immediately prior to procedure a "time out" was called to verify the correct patient, procedure, equipment, support staff and site/side marked as required.  Preparation: Patient was prepped and draped in the usual sterile fashion.  Vein Location: right AC  Ultrasound Guided  Gauge: 20  Normal blood return and flush without difficulty Patient tolerance: Patient tolerated the procedure well with no immediate complications.      Merrily Brittle, MD 04/16/18 2248

## 2018-04-16 NOTE — ED Provider Notes (Signed)
[facility] Emergency Department Provider Note       Time seen: ----------------------------------------- 5:22 PM on 04/16/2018 -----------------------------------------   I have reviewed the triage vital signs and the nursing notes.  HISTORY   Chief Complaint No chief complaint on file.    HPI Daisy [name] is a 52 y.o. female with a history of diabetes, gastroparesis, ventral hernia, hypertension, migraine who presents to the ED for persistent vomiting.  Patient reportedly has a history of same, does have a history of gastroparesis and nausea and vomiting.  Typically she takes Reglan or Phenergan but she has not been able to keep anything down today.  She has not checked her blood sugar today.  She states her blood sugar yesterday was normal.  Past Medical History:  Diagnosis Date  . Diabetes mellitus without complication (HCC)   . Gastritis   . Gastroparesis   . Hernia of abdominal wall   . Hypertension   . Migraine     Patient Active Problem List   Diagnosis Date Noted  . Intractable nausea and vomiting 01/14/2018  . Nausea & vomiting 01/14/2018  . Allergic rhinitis 04/18/2017  . Gastroparesis 01/03/2017  . Hyperlipidemia 06/18/2016  . DMII (diabetes mellitus, type 2) (HCC) 06/18/2016  . Bipolar 2 disorder (HCC) 06/18/2016  . Depression 06/18/2016  . GERD (gastroesophageal reflux disease) 06/18/2016  . Hypertension 06/18/2016  . Ingrown toenail 06/18/2016  . Hypokalemia 09/28/2015  . Chronic abdominal pain 05/23/2015  . Complex ovarian cyst 05/23/2015  . Anxiety 03/27/2015  . Insomnia 03/27/2015  . Gastritis 01/07/2015  . DDD (degenerative disc disease), lumbar 11/28/2014  . DJD (degenerative joint disease) of knee 11/28/2014  . Migraine headache 11/28/2014  . Bilateral occipital neuralgia 11/28/2014  . Morbid obesity (HCC) 11/28/2014  . Neuropathy due to secondary diabetes (HCC) 11/28/2014  . Abdominal wall abscess 09/20/2014  .  Back pain, chronic 09/20/2014  . Incisional hernia, without obstruction or gangrene 04/30/2014  . Bilateral chronic knee pain 03/04/2014    Past Surgical History:  Procedure Laterality Date  . ABDOMINAL HYSTERECTOMY    . APPENDECTOMY    . APPLICATION OF WOUND VAC    . CHOLECYSTECTOMY    . HERNIA REPAIR    . KNEE SURGERY      Allergies Sulfasalazine; Acetaminophen-codeine; Atorvastatin; Codeine; Ibuprofen; Morphine; Oxycodone; Propoxyphene; Sulfa antibiotics; and Duloxetine  Social History Social History   Tobacco Use  . Smoking status: Never Smoker  . Smokeless tobacco: Former Neurosurgeon    Types: Snuff  . Tobacco comment: quit > 30 years ago  Substance Use Topics  . Alcohol use: Yes    Alcohol/week: 0.0 standard drinks    Comment: only on special occasions- wine  . Drug use: No   Review of Systems Constitutional: Negative for fever. Cardiovascular: Negative for chest pain. Respiratory: Negative for shortness of breath. Gastrointestinal: Positive for abdominal pain, vomiting Musculoskeletal: Negative for back pain. Skin: Negative for rash. Neurological: Negative for headaches, focal weakness or numbness.  All systems negative/normal/unremarkable except as stated in the HPI  ____________________________________________   PHYSICAL EXAM:  VITAL SIGNS: ED Triage Vitals  Enc Vitals Group     BP      Pulse      Resp      Temp      Temp src      SpO2      Weight      Height      Head Circumference      Peak Flow  Pain Score      Pain Loc      Pain Edu?      Excl. in GC?    Constitutional: Alert and oriented.  No acute distress ENT   Head: Normocephalic and atraumatic.   Nose: No congestion/rhinnorhea.   Mouth/Throat: Mucous membranes are moist.   Neck: No stridor. Cardiovascular: Normal rate, regular rhythm. No murmurs, rubs, or gallops. Respiratory: Normal respiratory effort without tachypnea nor retractions. Breath sounds are clear and  equal bilaterally. No wheezes/rales/rhonchi. Gastrointestinal: Large right-sided abdominal hernia is noted, tender to touch Musculoskeletal: Nontender with normal range of motion in extremities. No lower extremity tenderness nor edema. Neurologic:  Normal speech and language. No gross focal neurologic deficits are appreciated.  Skin:  Skin is warm, dry and intact. No rash noted. Psychiatric: Mood and affect are normal. Speech and behavior are normal.  ___________________________________________  ED COURSE:  As part of my medical decision making, I reviewed the following data within the electronic MEDICAL RECORD NUMBER History obtained from family if available, nursing notes, old chart and ekg, as well as notes from prior ED visits. Patient presented for abdominal pain and vomiting with a history of same, we will assess with labs and imaging as indicated at this time.   Procedures   EKG: Interpreted by me, sinus rhythm with sinus arrhythmia, rate of 77 bpm, normal PR interval, normal QRS, normal QT ____________________________________________   LABS (pertinent positives/negatives)  Labs Reviewed  COMPREHENSIVE METABOLIC PANEL - Abnormal; Notable for the following components:      Result Value   Glucose, Bld 119 (*)    All other components within normal limits  CBC WITH DIFFERENTIAL/PLATELET  LIPASE, BLOOD  TROPONIN I  URINALYSIS, COMPLETE (UACMP) WITH MICROSCOPIC   ____________________________________________  DIFFERENTIAL DIAGNOSIS   Gastroparesis, DKA, hyperglycemia, dehydration, electrolyte abnormality, occult infection  FINAL ASSESSMENT AND PLAN  Intractable vomiting, hypertension   Plan: The patient had presented for persistent vomiting. Patient's labs did not reveal any acute process.  Patient is a chronic history of this.  Unclear if this is gastroparesis or some other etiology.  We have given fluids as well as multiple doses of antiemetics and pain medicine without  significant improvement.  She is hypertensive but blood pressure has improved.  I will discuss with the hospitalist for admission.  Ulice Dash, MD   Note: This note was generated in part or whole with voice recognition software. Voice recognition is usually quite accurate but there are transcription errors that can and very often do occur. I apologize for any typographical errors that were not detected and corrected.     Emily Filbert, MD 04/16/18 2034

## 2018-04-16 NOTE — ED Notes (Signed)
Attempted to call report to floor. Emma RN stated that she was not taking the pt due to the BP and stated that I should call Silvano Rusk. This RN called Dewayne Hatch and told her abt pt and floor refusing to take pt.

## 2018-04-16 NOTE — H&P (Addendum)
[facility] - [place] at [facility]   PATIENT NAME: Daisy [name]    MR#:  [id number]  DATE OF BIRTH:  Dec 12, 1965  DATE OF ADMISSION:  04/16/2018  PRIMARY CARE PHYSICIAN: Malva Limes, MD   REQUESTING/REFERRING PHYSICIAN: Dr. Chrissie Noa  CHIEF COMPLAINT:   Chief Complaint  Patient presents with  . Abdominal Pain  . Emesis    HISTORY OF PRESENT ILLNESS:  Daisy [name]  is a 52 y.o. female with a known history of diabetes mellitus, hypertension, morbid obesity, ventral abdominal hernia, gastroparesis, recurrent admissions for vomiting presents to the emergency room again for intractable vomiting.  Blood pressure is high as patient was unable to take her medications.  Patient was given multiple dose of Dilaudid, Phenergan, Reglan, Zofran and continues to have vomiting and is being admitted to the hospital.  Patient pulled her IV line out earlier.  Presently waiting for IV team to place IV line.    PAST MEDICAL HISTORY:   Past Medical History:  Diagnosis Date  . Diabetes mellitus without complication (HCC)   . Gastritis   . Gastroparesis   . Hernia of abdominal wall   . Hypertension   . Migraine     PAST SURGICAL HISTORY:   Past Surgical History:  Procedure Laterality Date  . ABDOMINAL HYSTERECTOMY    . APPENDECTOMY    . APPLICATION OF WOUND VAC    . CHOLECYSTECTOMY    . HERNIA REPAIR    . KNEE SURGERY      SOCIAL HISTORY:   Social History   Tobacco Use  . Smoking status: Never Smoker  . Smokeless tobacco: Former Neurosurgeon    Types: Snuff  . Tobacco comment: quit > 30 years ago  Substance Use Topics  . Alcohol use: Yes    Alcohol/week: 0.0 standard drinks    Comment: only on special occasions- wine    FAMILY HISTORY:   Family History  Problem Relation Age of Onset  . Arthritis Mother   . Depression Mother   . Diabetes Mother   . Heart disease Mother   . Hyperlipidemia Mother   . Hypertension Mother   . Stroke Mother   . Arthritis  Father   . Diabetes Father   . Hearing loss Father   . Heart disease Father   . Hyperlipidemia Father   . Hypertension Father     DRUG ALLERGIES:   Allergies  Allergen Reactions  . Sulfasalazine Hives  . Acetaminophen-Codeine Hives  . Atorvastatin Other (See Comments)    Muscle pain  . Codeine Hives  . Ibuprofen Nausea And Vomiting  . Propoxyphene Other (See Comments)    Reaction:  GI upset   . Sulfa Antibiotics Hives  . Duloxetine Rash and Other (See Comments)    Yellowing of the skin    REVIEW OF SYSTEMS:   Review of Systems  Constitutional: Positive for malaise/fatigue. Negative for chills and fever.  HENT: Negative for sore throat.   Eyes: Negative for blurred vision, double vision and pain.  Respiratory: Negative for cough, hemoptysis, shortness of breath and wheezing.   Cardiovascular: Negative for chest pain, palpitations, orthopnea and leg swelling.  Gastrointestinal: Positive for abdominal pain, nausea and vomiting. Negative for constipation, diarrhea and heartburn.  Genitourinary: Negative for dysuria and hematuria.  Musculoskeletal: Negative for back pain and joint pain.  Skin: Negative for rash.  Neurological: Negative for sensory change, speech change, focal weakness and headaches.  Endo/Heme/Allergies: Does not bruise/bleed easily.  Psychiatric/Behavioral: Negative for depression. The  patient is nervous/anxious.     MEDICATIONS AT HOME:   Prior to Admission medications   Medication Sig Start Date End Date Taking? Authorizing Provider  albuterol (PROVENTIL HFA;VENTOLIN HFA) 108 (90 Base) MCG/ACT inhaler Inhale 2 puffs into the lungs every 6 (six) hours as needed for wheezing or shortness of breath. 12/03/17   Malva Limes, MD  alprazolam Prudy Feeler) 2 MG tablet Take 2 mg by mouth 3 (three) times daily as needed for anxiety.     [provider]  amLODipine (NORVASC) 5 MG tablet Take 1 tablet (5 mg total) by mouth daily. 03/24/18   Malva Limes,  MD  cholecalciferol (VITAMIN D) 1000 units tablet Take 1,000 Units by mouth daily.    [provider]  cloNIDine (CATAPRES - DOSED IN MG/24 HR) 0.1 mg/24hr patch PLACE 1 PATCH (0.1 MG TOTAL) ONTO THE SKIN ONCE A WEEK. 11/30/16   Malva Limes, MD  FLUoxetine (PROZAC) 20 MG capsule Take 1 capsule by mouth daily.    [provider]  fluticasone (FLONASE) 50 MCG/ACT nasal spray Place 2 sprays into both nostrils daily. 12/29/17   Malva Limes, MD  furosemide (LASIX) 20 MG tablet Take 1 tablet (20 mg total) by mouth daily. 03/24/18   Malva Limes, MD  KLOR-CON M20 20 MEQ tablet TAKE 1 TABLET (20 MEQ TOTAL) BY MOUTH DAILY. TAKE WITH FUROSEMIDE 01/19/18   Malva Limes, MD  lamoTRIgine (LAMICTAL) 100 MG tablet Take 1 tablet by mouth daily.    [provider]  lisinopril (PRINIVIL,ZESTRIL) 20 MG tablet Take 1 tablet (20 mg total) by mouth daily. 11/04/17   Malva Limes, MD  loratadine (CLARITIN) 10 MG tablet Take 1 tablet (10 mg total) by mouth daily. 03/18/17   Enedina Finner, MD  metFORMIN (GLUCOPHAGE) 500 MG tablet Take 1 tablet (500 mg total) by mouth 2 (two) times daily with a meal. 04/15/17   Malva Limes, MD  metoCLOPramide (REGLAN) 10 MG tablet Take 1 tablet (10 mg total) by mouth 3 (three) times daily with meals. 11/30/17 11/30/18  Auburn Bilberry, MD  montelukast (SINGULAIR) 10 MG tablet TAKE 1 TABLET BY MOUTH EVERYDAY AT BEDTIME 10/10/17   Malva Limes, MD  Oxycodone HCl 10 MG TABS Take 1 tablet (10 mg total) by mouth every 6 (six) hours as needed (pain). 11/30/17   Auburn Bilberry, MD  pantoprazole (PROTONIX) 40 MG tablet TAKE 1 TABLET BY MOUTH TWICE A DAY 12/04/17   Malva Limes, MD  promethazine (PHENERGAN) 12.5 MG tablet Take 1 tablet (12.5 mg total) by mouth every 6 (six) hours as needed for refractory nausea / vomiting. 11/30/17   Auburn Bilberry, MD  protein supplement shake (PREMIER PROTEIN) LIQD Take 325 mLs (11 oz total) by mouth 3 (three) times  daily between meals. 03/18/17   Enedina Finner, MD  ranitidine (ZANTAC) 150 MG tablet Take 1 tablet (150 mg total) by mouth at bedtime. 10/11/17   Malva Limes, MD  rosuvastatin (CRESTOR) 10 MG tablet TAKE 1 TABLET BY MOUTH EVERY DAY 01/20/18   Malva Limes, MD  tiZANidine (ZANAFLEX) 4 MG tablet Take 4 mg by mouth 3 (three) times daily. 1-3 tablets per day as tolerated per Dr. Metta Clines    [provider]  traZODone (DESYREL) 100 MG tablet Take 100 mg by mouth at bedtime. 10/12/16   [provider]  vitamin B-12 (CYANOCOBALAMIN) 1000 MCG tablet Take 1,000 mcg by mouth daily.    [provider]  vitamin C (ASCORBIC ACID) 500 MG tablet Take 500 mg by mouth daily.    [provider]  zolpidem (AMBIEN) 10 MG tablet Take 1 tablet by mouth at bedtime as needed. 03/23/17   [provider]     VITAL SIGNS:  Blood pressure (!) 185/120, pulse 80, temperature 99 F (37.2 C), temperature source Oral, resp. rate 18, height 5\' 4"  (1.626 m), weight 125.6 kg, SpO2 96 %.  PHYSICAL EXAMINATION:  Physical Exam  GENERAL:  52 y.o.-year-old patient lying in the bed , morbidly obese EYES: Pupils equal, round, reactive to light and accommodation. No scleral icterus. Extraocular muscles intact.  HEENT: Head atraumatic, normocephalic. Oropharynx and nasopharynx clear. No oropharyngeal erythema, moist oral mucosa  NECK:  Supple, no jugular venous distention. No thyroid enlargement, no tenderness.  LUNGS: Normal breath sounds bilaterally, no wheezing, rales, rhonchi. No use of accessory muscles of respiration.  CARDIOVASCULAR: S1, S2 normal. No murmurs, rubs, or gallops.  ABDOMEN: Soft, diffusely tender, nondistended. Bowel sounds present. No organomegaly or mass.  EXTREMITIES: No pedal edema, cyanosis, or clubbing. + 2 pedal & radial pulses b/l.   NEUROLOGIC: Cranial nerves II through XII are intact. No focal Motor or sensory deficits appreciated b/l PSYCHIATRIC: The patient is  alert and oriented x 3.  Anxious SKIN: No obvious rash, lesion, or ulcer.   LABORATORY PANEL:   CBC Recent Labs  Lab 04/16/18 1739  WBC 9.3  HGB 12.8  HCT 41.2  PLT 346   ------------------------------------------------------------------------------------------------------------------  Chemistries  Recent Labs  Lab 04/16/18 1739  NA 141  K 4.4  CL 104  CO2 29  GLUCOSE 119*  BUN 7  CREATININE 0.70  CALCIUM 9.1  AST 19  ALT 13  ALKPHOS 63  BILITOT 0.6   ------------------------------------------------------------------------------------------------------------------  Cardiac Enzymes Recent Labs  Lab 04/16/18 1739  TROPONINI <0.03   ------------------------------------------------------------------------------------------------------------------  RADIOLOGY:  No results found.   IMPRESSION AND PLAN:   * HTN urgency Due to not being able to keep medications down with vomiting also contributed by pain No IV line at this time. IV team consulted. Will give oral dose of clonidine.  Added IV hydralazine to be given once IV line placed. Patient can be admitted to medical floor once her blood pressure trends down with medications.  Or else will need to be admitted to stepdown unit. We will give her home medications of amlodipine and lisinopril now.  *Gastroparesis with intractable vomiting. Start scheduled IV Reglan.  Added Zofran as needed.  *Diabetes mellitus.  Sliding scale insulin added every 4 hours.  Patient is n.p.o.  *DVT prophylaxis with Lovenox  All the records are reviewed and case discussed with ED provider. Management plans discussed with the patient, family and they are in agreement.  CODE STATUS: FULL CODE  TOTAL CC TIME TAKING CARE OF THIS PATIENT: 40 minutes.   Orie Fisherman M.D on 04/16/2018 at 9:08 PM  Between 7am to 6pm - Pager - [phone]  After 6pm go to [email or web address] - password [email or web address] [facility]  [facility] [place] [facility]  Office   [phone]  CC: Primary care physician; Malva Limes, MD  Note: This dictation was prepared with Dragon dictation along with smaller phrase technology. Any transcriptional errors that result from this process are unintentional.

## 2018-04-16 NOTE — ED Triage Notes (Signed)
Pt started vomiting around 1400 and states that she is having luq pain, states hx of hernia and feels like her food isn't going anywhere, pt reports that she has vomited approx 5 times since 1400

## 2018-04-16 NOTE — ED Notes (Signed)
Pt vomiting in bag

## 2018-04-16 NOTE — ED Notes (Signed)
Called to pt room. Pt had gotten out of bed by herself without calling for help. Pt has pulled out IV. Blood all over room. Cleaned up, will place new IV as assignment allows.

## 2018-04-16 NOTE — ED Notes (Signed)
IV team just updated me and said she could not find an IV on the pt.

## 2018-04-16 NOTE — Progress Notes (Signed)
lovenox changed to 40 mg BID for BMI >40 and CrCl >30. 

## 2018-04-16 NOTE — ED Notes (Signed)
Helped pt to bathroom. 

## 2018-04-17 ENCOUNTER — Other Ambulatory Visit: Payer: Self-pay

## 2018-04-17 ENCOUNTER — Encounter: Payer: Self-pay | Admitting: Emergency Medicine

## 2018-04-17 DIAGNOSIS — I10 Essential (primary) hypertension: Secondary | ICD-10-CM | POA: Diagnosis not present

## 2018-04-17 DIAGNOSIS — E119 Type 2 diabetes mellitus without complications: Secondary | ICD-10-CM | POA: Diagnosis not present

## 2018-04-17 DIAGNOSIS — E1143 Type 2 diabetes mellitus with diabetic autonomic (poly)neuropathy: Secondary | ICD-10-CM | POA: Diagnosis not present

## 2018-04-17 DIAGNOSIS — R1110 Vomiting, unspecified: Secondary | ICD-10-CM | POA: Diagnosis not present

## 2018-04-17 DIAGNOSIS — K3184 Gastroparesis: Secondary | ICD-10-CM | POA: Diagnosis not present

## 2018-04-17 LAB — URINALYSIS, COMPLETE (UACMP) WITH MICROSCOPIC
BACTERIA UA: NONE SEEN
BILIRUBIN URINE: NEGATIVE
Glucose, UA: NEGATIVE mg/dL
HGB URINE DIPSTICK: NEGATIVE
KETONES UR: NEGATIVE mg/dL
LEUKOCYTES UA: NEGATIVE
NITRITE: NEGATIVE
PH: 6 (ref 5.0–8.0)
Protein, ur: NEGATIVE mg/dL
SPECIFIC GRAVITY, URINE: 1.017 (ref 1.005–1.030)

## 2018-04-17 LAB — BASIC METABOLIC PANEL
Anion gap: 8 (ref 5–15)
BUN: 6 mg/dL (ref 6–20)
CHLORIDE: 104 mmol/L (ref 98–111)
CO2: 28 mmol/L (ref 22–32)
Calcium: 8.9 mg/dL (ref 8.9–10.3)
Creatinine, Ser: 0.62 mg/dL (ref 0.44–1.00)
GFR calc Af Amer: >60 mL/min (ref >60)
GLUCOSE: 125 mg/dL — AB (ref 70–99)
POTASSIUM: 4 mmol/L (ref 3.5–5.1)
Sodium: 140 mmol/L (ref 135–145)

## 2018-04-17 LAB — GLUCOSE, CAPILLARY
GLUCOSE-CAPILLARY: 113 mg/dL — AB (ref 70–99)
GLUCOSE-CAPILLARY: 128 mg/dL — AB (ref 70–99)
GLUCOSE-CAPILLARY: 122 mg/dL — AB (ref 70–99)
Glucose-Capillary: 131 mg/dL — ABNORMAL HIGH (ref 70–99)

## 2018-04-17 LAB — CBC
HCT: 37.3 % (ref 36.0–46.0)
HEMOGLOBIN: 11.9 g/dL — AB (ref 12.0–15.0)
MCH: 28.2 pg (ref 26.0–34.0)
MCHC: 31.9 g/dL (ref 30.0–36.0)
MCV: 88.4 fL (ref 80.0–100.0)
NRBC: 0 % (ref 0.0–0.2)
Platelets: 353 10*3/uL (ref 150–400)
RBC: 4.22 MIL/uL (ref 3.87–5.11)
RDW: 14.9 % (ref 11.5–15.5)
WBC: 10.5 10*3/uL (ref 4.0–10.5)

## 2018-04-17 MED ORDER — PNEUMOCOCCAL VAC POLYVALENT 25 MCG/0.5ML IJ INJ
0.5000 mL | INJECTION | INTRAMUSCULAR | Status: AC
  Administered 2018-04-17: 0.5 mL via INTRAMUSCULAR

## 2018-04-17 MED ORDER — TIZANIDINE HCL 4 MG PO TABS
4.0000 mg | ORAL_TABLET | Freq: Three times a day (TID) | ORAL | Status: DC
  Administered 2018-04-17: 4 mg via ORAL
  Filled 2018-04-17 (×3): qty 1

## 2018-04-17 MED ORDER — NITROGLYCERIN 2 % TD OINT
0.5000 [in_us] | TOPICAL_OINTMENT | Freq: Once | TRANSDERMAL | Status: AC
  Administered 2018-04-17: 0.5 [in_us] via TOPICAL

## 2018-04-17 MED ORDER — ACETAMINOPHEN 650 MG RE SUPP: 650.0000 mg | Freq: Four times a day (QID) | RECTAL | Status: DC | PRN

## 2018-04-17 MED ORDER — NITROGLYCERIN 2 % TD OINT
1.0000 [in_us] | TOPICAL_OINTMENT | TRANSDERMAL | Status: AC
  Administered 2018-04-17 (×2): 1 [in_us] via TOPICAL

## 2018-04-17 MED ORDER — FUROSEMIDE 20 MG PO TABS
20.0000 mg | ORAL_TABLET | Freq: Every day | ORAL | Status: DC
  Filled 2018-04-17: qty 1

## 2018-04-17 MED ORDER — ONDANSETRON HCL 4 MG/2ML IJ SOLN: 4.0000 mg | Freq: Four times a day (QID) | INTRAMUSCULAR | Status: DC | PRN

## 2018-04-17 MED ORDER — ONDANSETRON HCL 4 MG PO TABS: 4.0000 mg | ORAL_TABLET | Freq: Four times a day (QID) | ORAL | Status: DC | PRN

## 2018-04-17 MED ORDER — NITROGLYCERIN 2 % TD OINT
TOPICAL_OINTMENT | TRANSDERMAL | Status: AC
  Administered 2018-04-17: 1 [in_us] via TOPICAL
  Filled 2018-04-17: qty 1

## 2018-04-17 MED ORDER — LABETALOL HCL 5 MG/ML IV SOLN
INTRAVENOUS | Status: AC
  Filled 2018-04-17: qty 4

## 2018-04-17 MED ORDER — ZOLPIDEM TARTRATE 5 MG PO TABS: 10.0000 mg | ORAL_TABLET | Freq: Every evening | ORAL | Status: DC | PRN

## 2018-04-17 MED ORDER — OXYCODONE HCL 5 MG PO TABS
10.0000 mg | ORAL_TABLET | Freq: Four times a day (QID) | ORAL | Status: DC | PRN
  Administered 2018-04-17 (×3): 10 mg via ORAL
  Filled 2018-04-17 (×3): qty 2

## 2018-04-17 MED ORDER — NITROGLYCERIN 2 % TD OINT
TOPICAL_OINTMENT | TRANSDERMAL | Status: AC
  Filled 2018-04-17: qty 1

## 2018-04-17 MED ORDER — FLUOXETINE HCL 20 MG PO CAPS
20.0000 mg | ORAL_CAPSULE | Freq: Every day | ORAL | Status: DC
  Administered 2018-04-17: 20 mg via ORAL
  Filled 2018-04-17: qty 1

## 2018-04-17 MED ORDER — HYDRALAZINE HCL 20 MG/ML IJ SOLN: 20.0000 mg | Freq: Four times a day (QID) | INTRAMUSCULAR | Status: DC | PRN

## 2018-04-17 MED ORDER — ROSUVASTATIN CALCIUM 10 MG PO TABS
10.0000 mg | ORAL_TABLET | Freq: Every day | ORAL | Status: DC
  Administered 2018-04-17: 10 mg via ORAL
  Filled 2018-04-17: qty 1

## 2018-04-17 MED ORDER — TRAZODONE HCL 100 MG PO TABS: 100.0000 mg | ORAL_TABLET | Freq: Every day | ORAL | Status: DC

## 2018-04-17 MED ORDER — LAMOTRIGINE 100 MG PO TABS
100.0000 mg | ORAL_TABLET | Freq: Every day | ORAL | Status: DC
  Administered 2018-04-17: 100 mg via ORAL
  Filled 2018-04-17: qty 1

## 2018-04-17 MED ORDER — LABETALOL HCL 5 MG/ML IV SOLN: 10.0000 mg | INTRAVENOUS | Status: DC | PRN

## 2018-04-17 MED ORDER — MONTELUKAST SODIUM 10 MG PO TABS: 10.0000 mg | ORAL_TABLET | Freq: Every day | ORAL | Status: DC

## 2018-04-17 MED ORDER — ALPRAZOLAM 0.5 MG PO TABS
2.0000 mg | ORAL_TABLET | Freq: Two times a day (BID) | ORAL | Status: DC | PRN
  Administered 2018-04-17: 2 mg via ORAL
  Filled 2018-04-17: qty 4

## 2018-04-17 MED ORDER — METOCLOPRAMIDE HCL 5 MG/ML IJ SOLN
10.0000 mg | Freq: Four times a day (QID) | INTRAMUSCULAR | Status: DC
  Administered 2018-04-17 (×2): 10 mg via INTRAVENOUS
  Filled 2018-04-17 (×2): qty 2

## 2018-04-17 MED ORDER — PANTOPRAZOLE SODIUM 40 MG PO TBEC
40.0000 mg | DELAYED_RELEASE_TABLET | Freq: Two times a day (BID) | ORAL | Status: DC
  Administered 2018-04-17 (×2): 40 mg via ORAL
  Filled 2018-04-17 (×2): qty 1

## 2018-04-17 MED ORDER — LABETALOL HCL 5 MG/ML IV SOLN
10.0000 mg | INTRAVENOUS | Status: DC | PRN
  Administered 2018-04-17: 10 mg via INTRAVENOUS

## 2018-04-17 MED ORDER — ACETAMINOPHEN 325 MG PO TABS: 650.0000 mg | ORAL_TABLET | Freq: Four times a day (QID) | ORAL | Status: DC | PRN

## 2018-04-17 NOTE — Progress Notes (Signed)
BP low.  Dr Allena Katz notified and gave order to hold am lasix, norvasc, and lisinopril

## 2018-04-17 NOTE — Care Management Obs Status (Addendum)
MEDICARE OBSERVATION STATUS NOTIFICATION   Patient Details  Name: Daisy [name] MRN: [id number] Date of Birth: 1966-05-28   Medicare Observation Status Notification Given:  No admitted obs less than 24 hours   Chapman Fitch, RN 04/17/2018, 9:56 AM

## 2018-04-17 NOTE — ED Notes (Signed)
Pt up to bathroom. Pt is vomiting

## 2018-04-17 NOTE — Progress Notes (Signed)
Patient discharged via wheelchair to her waiting ride

## 2018-04-17 NOTE — ED Notes (Signed)
Alexander Bergeron, MD to ask about pt BP and to ask if he was comfortable with the pt going to designated floor. Anne Hahn, MD placed new parameters for the pt's pressure and put in an order for pt to have Labetalol. Anne Hahn, MD stated that we will try the labetalol and if that doesn't work we will also try Nitro paste and once pt reaches the desired BP range pt will go to the floor.

## 2018-04-17 NOTE — Progress Notes (Signed)
Pt here with significantly elevated blood pressure.  Multiple antihypertensives administered.  Patient's blood pressure goal tonight is less than 180/100, with multiple PRN medications in place.  It is recommended not to reduce severely elevated blood pressure in a chronic hypertensive to greatly too quickly.  Tomorrow her blood pressure goal can be reduced to 160/100.  Daisy [name] [facility] 04/17/2018, 1:38 AM

## 2018-04-18 NOTE — Discharge Summary (Signed)
[facility] - [place] at [facility], 52 y.o., DOB Dec 24, 1965, MRN [id number]. Admission date: 04/16/2018 Discharge Date 04/18/2018 Primary MD Malva Limes, MD Admitting Physician Milagros Loll, MD  Admission Diagnosis  Intractable vomiting with nausea, unspecified vomiting type [R11.2] Hypertension, unspecified type [I10]  Discharge Diagnosis   Active Problems: Intractable nausea vomiting being due to gastroparesis Pretense of urgency Gastroparesis Daibetes type II    Hospital Course  Patient is 52 year old with history of gastroparesis presented with nausea vomiting patient also was noted to have elevated blood pressure.  Her blood pressure was treated she was treated with antiemetics with significant improvement in her symptoms.  Patient is doing much better and stable for discharge.             Consults  None  Significant Tests:  See full reports for all details     No results found.     Today   Subjective:   Daisy [name] patient doing better Objective:   Blood pressure 110/78, pulse 80, temperature 99.3 F (37.4 C), temperature source Oral, resp. rate 18, height 5\' 5"  (1.651 m), weight 129 kg, SpO2 92 %.  . No intake or output data in the 24 hours ending 04/18/18 1426  Exam VITAL SIGNS: Blood pressure 110/78, pulse 80, temperature 99.3 F (37.4 C), temperature source Oral, resp. rate 18, height 5\' 5"  (1.651 m), weight 129 kg, SpO2 92 %.  GENERAL:  52 y.o.-year-old patient lying in the bed with no acute distress.  EYES: Pupils equal, round, reactive to light and accommodation. No scleral icterus. Extraocular muscles intact.  HEENT: Head atraumatic, normocephalic. Oropharynx and nasopharynx clear.  NECK:  Supple, no jugular venous distention. No thyroid enlargement, no tenderness.  LUNGS: Normal breath sounds bilaterally, no wheezing, rales,rhonchi or crepitation. No use of accessory muscles of respiration.   CARDIOVASCULAR: S1, S2 normal. No murmurs, rubs, or gallops.  ABDOMEN: Soft, nontender, nondistended. Bowel sounds present. No organomegaly or mass.  EXTREMITIES: No pedal edema, cyanosis, or clubbing.  NEUROLOGIC: Cranial nerves II through XII are intact. Muscle strength 5/5 in all extremities. Sensation intact. Gait not checked.  PSYCHIATRIC: The patient is alert and oriented x 3.  SKIN: No obvious rash, lesion, or ulcer.   Data Review     CBC w Diff:  Lab Results  Component Value Date   WBC 10.5 04/17/2018   HGB 11.9 (L) 04/17/2018   HGB 12.6 04/04/2014   HCT 37.3 04/17/2018   HCT 40.2 04/04/2014   PLT 353 04/17/2018   PLT 411 04/04/2014   LYMPHOPCT 36 04/16/2018   LYMPHOPCT 32.7 04/04/2014   MONOPCT 6 04/16/2018   MONOPCT 4.7 04/04/2014   EOSPCT 2 04/16/2018   EOSPCT 0.2 04/04/2014   BASOPCT 0 04/16/2018   BASOPCT 0.6 04/04/2014   CMP:  Lab Results  Component Value Date   NA 140 04/17/2018   NA 140 10/11/2017   NA 142 04/04/2014   K 4.0 04/17/2018   K 3.7 04/04/2014   CL 104 04/17/2018   CL 103 04/04/2014   CO2 28 04/17/2018   CO2 29 04/04/2014   BUN 6 04/17/2018   BUN 10 10/11/2017   BUN 7 04/04/2014   CREATININE 0.62 04/17/2018   CREATININE 0.98 04/04/2014   PROT 8.1 04/16/2018   PROT 7.2 10/11/2017   PROT 8.5 (H) 04/04/2014   ALBUMIN 4.2 04/16/2018   ALBUMIN 4.3 10/11/2017   ALBUMIN 3.7 04/04/2014   BILITOT 0.6 04/16/2018   BILITOT 0.3  10/11/2017   BILITOT 0.4 04/04/2014   ALKPHOS 63 04/16/2018   ALKPHOS 67 04/04/2014   AST 19 04/16/2018   AST 21 04/04/2014   ALT 13 04/16/2018   ALT 25 04/04/2014  .  Micro Results No results found for this or any previous visit (from the past 240 hour(s)).   Code Status History    Date Active Date Inactive Code Status Order ID Comments User Context   04/16/2018 2108 04/17/2018 2106 Full Code [id number]  Milagros Loll, MD ED   01/14/2018 1207 01/15/2018 1410 Full Code [id number]  Barbaraann Rondo, MD ED    11/29/2017 0111 11/30/2017 1813 Full Code [id number]  Oralia Manis, MD Inpatient   10/02/2017 1226 10/03/2017 1959 Full Code [id number]  Shaune Pollack, MD Inpatient   03/16/2017 0454 03/18/2017 1637 Full Code [id number]  Ihor Austin, MD Inpatient   01/03/2017 0824 01/05/2017 1557 Full Code [id number]  Ihor Austin, MD Inpatient   03/23/2016 0629 03/26/2016 1651 Full Code [id number]  Arnaldo Natal, MD Inpatient   09/28/2015 0454 09/29/2015 1720 Full Code [id number]  Ihor Austin, MD ED   05/05/2015 2036 05/09/2015 2006 Full Code [id number]  Hower, Cletis Athens, MD ED   01/07/2015 1911 01/09/2015 1855 Full Code [id number]  Milagros Loll, MD ED          Follow-up Information    Malva Limes, MD. Go on 04/25/2018.   Specialty:  Family Medicine Why:  @4PM  Contact information: [address] [place] [place] [address] [phone]           Discharge Medications   Allergies as of 04/17/2018      Reactions   Sulfasalazine Hives   Acetaminophen-codeine Hives   Atorvastatin Other (See Comments)   Muscle pain   Codeine Hives   Ibuprofen Nausea And Vomiting   Propoxyphene Other (See Comments)   Reaction:  GI upset    Sulfa Antibiotics Hives   Duloxetine Rash, Other (See Comments)   Yellowing of the skin      Medication List    TAKE these medications   albuterol 108 (90 Base) MCG/ACT inhaler Commonly known as:  PROVENTIL HFA;VENTOLIN HFA Inhale 2 puffs into the lungs every 6 (six) hours as needed for wheezing or shortness of breath.   alprazolam 2 MG tablet Commonly known as:  XANAX Take 2 mg by mouth 3 (three) times daily.   amLODipine 5 MG tablet Commonly known as:  NORVASC Take 1 tablet (5 mg total) by mouth daily.   cholecalciferol 1000 units tablet Commonly known as:  VITAMIN D Take 1,000 Units by mouth daily.   cloNIDine 0.1 mg/24hr patch Commonly known as:  CATAPRES - Dosed in mg/24 hr PLACE 1 PATCH (0.1 MG TOTAL) ONTO THE SKIN ONCE A WEEK.   FLUoxetine 20  MG capsule Commonly known as:  PROZAC Take 1 capsule by mouth daily.   fluticasone 50 MCG/ACT nasal spray Commonly known as:  FLONASE Place 2 sprays into both nostrils daily.   furosemide 20 MG tablet Commonly known as:  LASIX Take 1 tablet (20 mg total) by mouth daily.   KLOR-CON M20 20 MEQ tablet Generic drug:  potassium chloride SA TAKE 1 TABLET (20 MEQ TOTAL) BY MOUTH DAILY. TAKE WITH FUROSEMIDE   lamoTRIgine 100 MG tablet Commonly known as:  LAMICTAL Take 1 tablet by mouth daily.   lisinopril 20 MG tablet Commonly known as:  PRINIVIL,ZESTRIL Take 1 tablet (20 mg total) by mouth daily.   loratadine 10 MG tablet Commonly known  as:  CLARITIN Take 1 tablet (10 mg total) by mouth daily. What changed:  when to take this   metFORMIN 500 MG tablet Commonly known as:  GLUCOPHAGE Take 1 tablet (500 mg total) by mouth 2 (two) times daily with a meal.   metoCLOPramide 10 MG tablet Commonly known as:  REGLAN Take 1 tablet (10 mg total) by mouth 3 (three) times daily with meals.   metoprolol succinate 25 MG 24 hr tablet Commonly known as:  TOPROL-XL Take 50 mg by mouth daily.   Oxycodone HCl 10 MG Tabs Take 1 tablet (10 mg total) by mouth every 6 (six) hours as needed (pain). What changed:    when to take this  additional instructions   pantoprazole 40 MG tablet Commonly known as:  PROTONIX TAKE 1 TABLET BY MOUTH TWICE A DAY   promethazine 12.5 MG tablet Commonly known as:  PHENERGAN Take 1 tablet (12.5 mg total) by mouth every 6 (six) hours as needed for refractory nausea / vomiting.   protein supplement shake Liqd Commonly known as:  PREMIER PROTEIN Take 325 mLs (11 oz total) by mouth 3 (three) times daily between meals.   ranitidine 150 MG tablet Commonly known as:  ZANTAC Take 1 tablet (150 mg total) by mouth at bedtime.   rosuvastatin 10 MG tablet Commonly known as:  CRESTOR TAKE 1 TABLET BY MOUTH EVERY DAY   tiZANidine 4 MG tablet Commonly known as:   ZANAFLEX Take 4 mg by mouth 3 (three) times daily.   traZODone 100 MG tablet Commonly known as:  DESYREL Take 100 mg by mouth at bedtime.   vitamin B-12 1000 MCG tablet Commonly known as:  CYANOCOBALAMIN Take 1,000 mcg by mouth daily.   vitamin C 500 MG tablet Commonly known as:  ASCORBIC ACID Take 500 mg by mouth daily.   zolpidem 10 MG tablet Commonly known as:  AMBIEN Take 1 tablet by mouth at bedtime.          Total Time in preparing paper work, data evaluation and todays exam - 35 minutes  Auburn Bilberry M.D on 04/18/2018 at 2:26 PM [facility]   Office  [phone]

## 2018-04-25 ENCOUNTER — Ambulatory Visit (INDEPENDENT_AMBULATORY_CARE_PROVIDER_SITE_OTHER): Payer: Medicare Other | Admitting: Family Medicine

## 2018-04-25 ENCOUNTER — Encounter: Payer: Self-pay | Admitting: Family Medicine

## 2018-04-25 VITALS — BP 90/55 | HR 70 | Temp 98.5°F | Resp 16 | Wt 287.0 lb

## 2018-04-25 DIAGNOSIS — I10 Essential (primary) hypertension: Secondary | ICD-10-CM

## 2018-04-25 DIAGNOSIS — K432 Incisional hernia without obstruction or gangrene: Secondary | ICD-10-CM

## 2018-04-25 DIAGNOSIS — G8929 Other chronic pain: Secondary | ICD-10-CM

## 2018-04-25 DIAGNOSIS — K3184 Gastroparesis: Secondary | ICD-10-CM | POA: Diagnosis not present

## 2018-04-25 DIAGNOSIS — R109 Unspecified abdominal pain: Secondary | ICD-10-CM | POA: Diagnosis not present

## 2018-04-25 DIAGNOSIS — J301 Allergic rhinitis due to pollen: Secondary | ICD-10-CM

## 2018-04-25 DIAGNOSIS — R112 Nausea with vomiting, unspecified: Secondary | ICD-10-CM | POA: Diagnosis not present

## 2018-04-25 DIAGNOSIS — E1169 Type 2 diabetes mellitus with other specified complication: Secondary | ICD-10-CM

## 2018-04-25 MED ORDER — CLONIDINE 0.1 MG/24HR TD PTWK: 0.1000 mg | MEDICATED_PATCH | TRANSDERMAL | 12 refills | Status: DC

## 2018-04-25 MED ORDER — CETIRIZINE HCL 10 MG PO TABS: 10.0000 mg | ORAL_TABLET | Freq: Every day | ORAL | 11 refills | Status: AC

## 2018-04-25 NOTE — Progress Notes (Signed)
Patient: Daisy [name] Female    DOB: Nov 02, 1965   52 y.o.   MRN: [id number] Visit Date: 04/25/2018  Today's Provider: Mila Merry, MD   Chief Complaint  Patient presents with  . Hospitalization Follow-up   Subjective:    HPI  Follow up Hospitalization  Patient was admitted to [facility] on 04/16/2018 and discharged on 04/17/2018. She was treated for nausea and vomiting due to Gastroparesis and Hypertension. Treatment for this included antiemetics. She reports good compliance with treatment. She reports this condition is Improved. Patient states the nausea and vomiting has resolved, and her blood pressure reading at home have been normal.   She has had several similar episodes over the last few years attributed to diabetic gastroparesis. She also has ventral abdominal wall hernia which frequently causes pain. She requests referral to new gastroenterologist at [facility] where she has been previously treated.  ------------------------------------------------------------------------------------  She reports her BP was very high when she was in the hospital. Since discharge she has been taking her medications consistently and keeping them down, and reports her bp has been very good, mostly in the 100s to 110s.  She also requests to change loratadine to cetirizine which she reports works much better for her allergies.      Allergies  Allergen Reactions  . Sulfasalazine Hives  . Acetaminophen-Codeine Hives  . Atorvastatin Other (See Comments)    Muscle pain  . Codeine Hives  . Ibuprofen Nausea And Vomiting  . Propoxyphene Other (See Comments)    Reaction:  GI upset   . Sulfa Antibiotics Hives  . Duloxetine Rash and Other (See Comments)    Yellowing of the skin     Current Outpatient Medications:  .  albuterol (PROVENTIL HFA;VENTOLIN HFA) 108 (90 Base) MCG/ACT inhaler, Inhale 2 puffs into the lungs every 6 (six) hours as needed for wheezing or shortness of breath., Disp: 18  g, Rfl: 3 .  alprazolam (XANAX) 2 MG tablet, Take 2 mg by mouth 3 (three) times daily. , Disp: , Rfl:  .  amLODipine (NORVASC) 5 MG tablet, Take 1 tablet (5 mg total) by mouth daily., Disp: 30 tablet, Rfl: 12 .  cholecalciferol (VITAMIN D) 1000 units tablet, Take 1,000 Units by mouth daily., Disp: , Rfl:  .  cloNIDine (CATAPRES - DOSED IN MG/24 HR) 0.1 mg/24hr patch, PLACE 1 PATCH (0.1 MG TOTAL) ONTO THE SKIN ONCE A WEEK., Disp: 4 patch, Rfl: 6 .  FLUoxetine (PROZAC) 20 MG capsule, Take 1 capsule by mouth daily., Disp: , Rfl:  .  fluticasone (FLONASE) 50 MCG/ACT nasal spray, Place 2 sprays into both nostrils daily., Disp: 16 g, Rfl: 6 .  furosemide (LASIX) 20 MG tablet, Take 1 tablet (20 mg total) by mouth daily., Disp: 30 tablet, Rfl: 12 .  KLOR-CON M20 20 MEQ tablet, TAKE 1 TABLET (20 MEQ TOTAL) BY MOUTH DAILY. TAKE WITH FUROSEMIDE, Disp: 90 tablet, Rfl: 1 .  lamoTRIgine (LAMICTAL) 100 MG tablet, Take 1 tablet by mouth daily., Disp: , Rfl:  .  lisinopril (PRINIVIL,ZESTRIL) 20 MG tablet, Take 1 tablet (20 mg total) by mouth daily., Disp: 3 tablet, Rfl: 0 .  loratadine (CLARITIN) 10 MG tablet, Take 1 tablet (10 mg total) by mouth daily. (Patient taking differently: Take 10 mg by mouth at bedtime. ), Disp: 30 tablet, Rfl: 0 .  metFORMIN (GLUCOPHAGE) 500 MG tablet, Take 1 tablet (500 mg total) by mouth 2 (two) times daily with a meal., Disp: 180 tablet, Rfl: 4 .  metoCLOPramide (REGLAN) 10 MG tablet, Take 1 tablet (10 mg total) by mouth 3 (three) times daily with meals., Disp: 90 tablet, Rfl: 11 .  metoprolol succinate (TOPROL-XL) 25 MG 24 hr tablet, Take 50 mg by mouth daily., Disp: , Rfl:  .  Oxycodone HCl 10 MG TABS, Take 1 tablet (10 mg total) by mouth every 6 (six) hours as needed (pain). (Patient taking differently: Take 10 mg by mouth as needed (pain). Can take 4-7 tablets per day), Disp: 40 tablet, Rfl: 0 .  pantoprazole (PROTONIX) 40 MG tablet, TAKE 1 TABLET BY MOUTH TWICE A DAY, Disp: 180  tablet, Rfl: 4 .  promethazine (PHENERGAN) 12.5 MG tablet, Take 1 tablet (12.5 mg total) by mouth every 6 (six) hours as needed for refractory nausea / vomiting., Disp: 30 tablet, Rfl: 3 .  protein supplement shake (PREMIER PROTEIN) LIQD, Take 325 mLs (11 oz total) by mouth 3 (three) times daily between meals., Disp: 30 Can, Rfl: 0 .  ranitidine (ZANTAC) 150 MG tablet, Take 1 tablet (150 mg total) by mouth at bedtime., Disp: 90 tablet, Rfl: 4 .  rosuvastatin (CRESTOR) 10 MG tablet, TAKE 1 TABLET BY MOUTH EVERY DAY (Patient taking differently: Take 10 mg by mouth daily. ), Disp: 90 tablet, Rfl: 4 .  tiZANidine (ZANAFLEX) 4 MG tablet, Take 4 mg by mouth 3 (three) times daily. , Disp: , Rfl:  .  traZODone (DESYREL) 100 MG tablet, Take 100 mg by mouth at bedtime., Disp: , Rfl:  .  vitamin B-12 (CYANOCOBALAMIN) 1000 MCG tablet, Take 1,000 mcg by mouth daily., Disp: , Rfl:  .  vitamin C (ASCORBIC ACID) 500 MG tablet, Take 500 mg by mouth daily., Disp: , Rfl:  .  zolpidem (AMBIEN) 10 MG tablet, Take 1 tablet by mouth at bedtime. , Disp: , Rfl:   Review of Systems  Constitutional: Negative for appetite change, chills, fatigue and fever.  Respiratory: Negative for chest tightness and shortness of breath.   Cardiovascular: Negative for chest pain and palpitations.  Gastrointestinal: Negative for abdominal pain, nausea and vomiting.  Neurological: Negative for dizziness and weakness.    Social History   Tobacco Use  . Smoking status: Never Smoker  . Smokeless tobacco: Former Neurosurgeon    Types: Snuff  . Tobacco comment: quit > 30 years ago  Substance Use Topics  . Alcohol use: Yes    Alcohol/week: 1.0 standard drinks    Types: 1 Standard drinks or equivalent per week    Comment: only on special occasions- wine   Objective:   BP (!) 90/55 (BP Location: Left Wrist, Cuff Size: Large)   Pulse 70   Temp 98.5 F (36.9 C) (Oral)   Resp 16   Wt 287 lb (130.2 kg)   SpO2 95% Comment: room air  BMI  47.76 kg/m  Vitals:   04/25/18 1546 04/25/18 1551  BP: 92/62 (!) 90/55  Pulse: 70   Resp: 16   Temp: 98.5 F (36.9 C)   TempSrc: Oral   SpO2: 95%   Weight: 287 lb (130.2 kg)      Physical Exam  General Appearance:    Alert, cooperative, no distress, obese  Eyes:    PERRL, conjunctiva/corneas clear, EOM's intact       Lungs:     Clear to auscultation bilaterally, respirations unlabored  Heart:    Regular rate and rhythm  Neurologic:   Awake, alert, oriented x 3. No apparent focal neurological  defect.          Assessment & Plan:     1. Gastroparesis  - Ambulatory referral to Gastroenterology  2. Nausea and vomiting, intractability of vomiting not specified, unspecified vomiting type Doing much better since discharge. Needs to be followed by GI - Ambulatory referral to Gastroenterology  3. Chronic abdominal pain  - Ambulatory referral to Gastroenterology  4. Incisional hernia, without obstruction or gangrene  - Ambulatory referral to Gastroenterology  5. Essential hypertension Well controlled.  Continue current medications.   - cloNIDine (CATAPRES - DOSED IN MG/24 HR) 0.1 mg/24hr patch; Place 1 patch (0.1 mg total) onto the skin once a week.  Dispense: 4 patch; Refill: 12  6. Allergic rhinitis due to pollen, unspecified seasonality Change loratadine to - cetirizine (ZYRTEC) 10 MG tablet; Take 1 tablet (10 mg total) by mouth daily.  Dispense: 30 tablet; Refill: 11  7. Type 2 diabetes mellitus with other specified complication, without long-term current use of insulin (HCC) Lab Results  Component Value Date   HGBA1C 5.9 (H) 04/16/2018   Return in about 5 months for routine follow up.   She refused flu vaccine today.        Mila Merryonald Warrene Kapfer, MD  [facility]

## 2018-05-10 ENCOUNTER — Encounter: Payer: Self-pay | Admitting: Emergency Medicine

## 2018-05-10 ENCOUNTER — Emergency Department
Admission: EM | Admit: 2018-05-10 | Discharge: 2018-05-10 | Disposition: A | Discharge status: Home / Self Care | Payer: Medicare Other | Attending: Emergency Medicine | Admitting: Emergency Medicine

## 2018-05-10 ENCOUNTER — Emergency Department: Payer: Medicare Other

## 2018-05-10 ENCOUNTER — Other Ambulatory Visit: Payer: Self-pay

## 2018-05-10 DIAGNOSIS — I10 Essential (primary) hypertension: Secondary | ICD-10-CM | POA: Insufficient documentation

## 2018-05-10 DIAGNOSIS — R110 Nausea: Secondary | ICD-10-CM | POA: Diagnosis not present

## 2018-05-10 DIAGNOSIS — Z79899 Other long term (current) drug therapy: Secondary | ICD-10-CM | POA: Insufficient documentation

## 2018-05-10 DIAGNOSIS — R109 Unspecified abdominal pain: Secondary | ICD-10-CM | POA: Diagnosis not present

## 2018-05-10 DIAGNOSIS — F419 Anxiety disorder, unspecified: Secondary | ICD-10-CM | POA: Diagnosis present

## 2018-05-10 DIAGNOSIS — E1140 Type 2 diabetes mellitus with diabetic neuropathy, unspecified: Secondary | ICD-10-CM | POA: Diagnosis not present

## 2018-05-10 DIAGNOSIS — Z7984 Long term (current) use of oral hypoglycemic drugs: Secondary | ICD-10-CM | POA: Diagnosis not present

## 2018-05-10 DIAGNOSIS — K3184 Gastroparesis: Secondary | ICD-10-CM | POA: Insufficient documentation

## 2018-05-10 DIAGNOSIS — R0789 Other chest pain: Secondary | ICD-10-CM | POA: Diagnosis not present

## 2018-05-10 DIAGNOSIS — R079 Chest pain, unspecified: Secondary | ICD-10-CM | POA: Diagnosis not present

## 2018-05-10 DIAGNOSIS — Z87891 Personal history of nicotine dependence: Secondary | ICD-10-CM | POA: Insufficient documentation

## 2018-05-10 LAB — CBC
HCT: 43.4 % (ref 36.0–46.0)
HEMOGLOBIN: 13.8 g/dL (ref 12.0–15.0)
MCH: 28 pg (ref 26.0–34.0)
MCHC: 31.8 g/dL (ref 30.0–36.0)
MCV: 88.2 fL (ref 80.0–100.0)
NRBC: 0 % (ref 0.0–0.2)
PLATELETS: 396 10*3/uL (ref 150–400)
RBC: 4.92 MIL/uL (ref 3.87–5.11)
RDW: 14.6 % (ref 11.5–15.5)
WBC: 9.8 10*3/uL (ref 4.0–10.5)

## 2018-05-10 LAB — COMPREHENSIVE METABOLIC PANEL
ALK PHOS: 59 U/L (ref 38–126)
ALT: 15 U/L (ref 0–44)
AST: 22 U/L (ref 15–41)
Albumin: 4.8 g/dL (ref 3.5–5.0)
Anion gap: 13 (ref 5–15)
BUN: 11 mg/dL (ref 6–20)
CALCIUM: 9.6 mg/dL (ref 8.9–10.3)
CHLORIDE: 99 mmol/L (ref 98–111)
CO2: 27 mmol/L (ref 22–32)
CREATININE: 0.86 mg/dL (ref 0.44–1.00)
GFR calc non Af Amer: >60 mL/min (ref >60)
Glucose, Bld: 163 mg/dL — ABNORMAL HIGH (ref 70–99)
Potassium: 4 mmol/L (ref 3.5–5.1)
SODIUM: 139 mmol/L (ref 135–145)
TOTAL PROTEIN: 9 g/dL — AB (ref 6.5–8.1)
Total Bilirubin: 0.7 mg/dL (ref 0.3–1.2)

## 2018-05-10 LAB — URINALYSIS, COMPLETE (UACMP) WITH MICROSCOPIC
BILIRUBIN URINE: NEGATIVE
Glucose, UA: NEGATIVE mg/dL
Ketones, ur: NEGATIVE mg/dL
LEUKOCYTES UA: NEGATIVE
NITRITE: NEGATIVE
PH: 6 (ref 5.0–8.0)
Protein, ur: 100 mg/dL — AB
SPECIFIC GRAVITY, URINE: 1.02 (ref 1.005–1.030)

## 2018-05-10 LAB — TROPONIN I

## 2018-05-10 LAB — LIPASE, BLOOD: Lipase: 27 U/L (ref 11–51)

## 2018-05-10 MED ORDER — ONDANSETRON HCL 4 MG PO TABS: 4.0000 mg | ORAL_TABLET | Freq: Three times a day (TID) | ORAL | 0 refills | Status: DC | PRN

## 2018-05-10 MED ORDER — ONDANSETRON HCL 4 MG/2ML IJ SOLN: 4.0000 mg | Freq: Once | INTRAMUSCULAR | Status: DC | PRN

## 2018-05-10 MED ORDER — DICYCLOMINE HCL 20 MG PO TABS
ORAL_TABLET | ORAL | Status: AC
  Administered 2018-05-10: 20 mg
  Filled 2018-05-10: qty 1

## 2018-05-10 MED ORDER — HALOPERIDOL LACTATE 5 MG/ML IJ SOLN
5.0000 mg | Freq: Once | INTRAMUSCULAR | Status: AC
  Administered 2018-05-10: 5 mg via INTRAVENOUS
  Filled 2018-05-10: qty 1

## 2018-05-10 MED ORDER — DICYCLOMINE HCL 20 MG PO TABS: 20.0000 mg | ORAL_TABLET | Freq: Three times a day (TID) | ORAL | 0 refills | Status: DC | PRN

## 2018-05-10 MED ORDER — HALOPERIDOL LACTATE 5 MG/ML IJ SOLN
5.0000 mg | Freq: Four times a day (QID) | INTRAMUSCULAR | Status: DC | PRN
  Administered 2018-05-10: 5 mg via INTRAVENOUS
  Filled 2018-05-10: qty 1

## 2018-05-10 MED ORDER — DICYCLOMINE HCL 10 MG PO CAPS: 20.0000 mg | ORAL_CAPSULE | Freq: Once | ORAL | Status: DC

## 2018-05-10 MED ORDER — SODIUM CHLORIDE 0.9 % IV BOLUS
1000.0000 mL | Freq: Once | INTRAVENOUS | Status: AC
  Administered 2018-05-10: 1000 mL via INTRAVENOUS

## 2018-05-10 NOTE — ED Provider Notes (Signed)
[facility] Emergency Department Provider Note  ____________________________________________   I have reviewed the triage vital signs and the nursing notes. Where available I have reviewed prior notes and, if possible and indicated, outside hospital notes.    HISTORY  Chief Complaint Chest Pain and Emesis    HPI Daisy [name] is a 52 y.o. female history of gastroparesis and anxiety, she states that when the holidays, long she gets anxious and that makes her gastroparesis worse and she is been having bad gastroparesis for the last 2 to 3 hours.  She denies any fever or chills.  No hematemesis.  Patient has poor IV access.  She is been here multiple times.  She states that she is anxious about going home because she is worried about the holidays.  She does not have any SI or HI.  No diarrhea.  She has the exact same abdominal pain she is had multiple times including earlier this month and multiple times this year.  Patient states that she always has gastroparesis but it gets worse when she is anxious.  She has had multiple CT scans for this, at least 9 different CT scans in the last 2 years at this facility alone, usually she gets admitted for brief periods and goes home sometimes we can get her home from the department.  She denies any fever she denies any chest pain, she states she feels anxious and unwell.     Past Medical History:  Diagnosis Date  . Diabetes mellitus without complication (HCC)   . Gastritis   . Gastroparesis   . Hernia of abdominal wall   . Hypertension   . Migraine     Patient Active Problem List   Diagnosis Date Noted  . Vomiting 04/16/2018  . Intractable nausea and vomiting 01/14/2018  . Nausea & vomiting 01/14/2018  . Allergic rhinitis 04/18/2017  . Gastroparesis 01/03/2017  . Hyperlipidemia 06/18/2016  . DMII (diabetes mellitus, type 2) (HCC) 06/18/2016  . Bipolar 2 disorder (HCC) 06/18/2016  . Depression 06/18/2016  . GERD  (gastroesophageal reflux disease) 06/18/2016  . Hypertension 06/18/2016  . Ingrown toenail 06/18/2016  . Hypokalemia 09/28/2015  . Chronic abdominal pain 05/23/2015  . Complex ovarian cyst 05/23/2015  . Anxiety 03/27/2015  . Insomnia 03/27/2015  . Gastritis 01/07/2015  . DDD (degenerative disc disease), lumbar 11/28/2014  . DJD (degenerative joint disease) of knee 11/28/2014  . Migraine headache 11/28/2014  . Bilateral occipital neuralgia 11/28/2014  . Morbid obesity (HCC) 11/28/2014  . Neuropathy due to secondary diabetes (HCC) 11/28/2014  . Abdominal wall abscess 09/20/2014  . Back pain, chronic 09/20/2014  . Incisional hernia, without obstruction or gangrene 04/30/2014  . Bilateral chronic knee pain 03/04/2014    Past Surgical History:  Procedure Laterality Date  . ABDOMINAL HYSTERECTOMY    . APPENDECTOMY    . APPLICATION OF WOUND VAC    . CHOLECYSTECTOMY    . HERNIA REPAIR    . KNEE SURGERY      Prior to Admission medications   Medication Sig Start Date End Date Taking? Authorizing Provider  albuterol (PROVENTIL HFA;VENTOLIN HFA) 108 (90 Base) MCG/ACT inhaler Inhale 2 puffs into the lungs every 6 (six) hours as needed for wheezing or shortness of breath. 12/03/17   Malva Limes, MD  alprazolam Prudy Feeler) 2 MG tablet Take 2 mg by mouth 3 (three) times daily.     [provider]  amLODipine (NORVASC) 5 MG tablet Take 1 tablet (5 mg total) by mouth  daily. 03/24/18   Malva Limes, MD  cetirizine (ZYRTEC) 10 MG tablet Take 1 tablet (10 mg total) by mouth daily. 04/25/18   Malva Limes, MD  cholecalciferol (VITAMIN D) 1000 units tablet Take 1,000 Units by mouth daily.    [provider]  cloNIDine (CATAPRES - DOSED IN MG/24 HR) 0.1 mg/24hr patch Place 1 patch (0.1 mg total) onto the skin once a week. 04/25/18   Malva Limes, MD  FLUoxetine (PROZAC) 20 MG capsule Take 1 capsule by mouth daily.    [provider]  fluticasone (FLONASE) 50  MCG/ACT nasal spray Place 2 sprays into both nostrils daily. 12/29/17   Malva Limes, MD  furosemide (LASIX) 20 MG tablet Take 1 tablet (20 mg total) by mouth daily. 03/24/18   Malva Limes, MD  KLOR-CON M20 20 MEQ tablet TAKE 1 TABLET (20 MEQ TOTAL) BY MOUTH DAILY. TAKE WITH FUROSEMIDE 01/19/18   Malva Limes, MD  lamoTRIgine (LAMICTAL) 100 MG tablet Take 1 tablet by mouth daily.    [provider]  lisinopril (PRINIVIL,ZESTRIL) 20 MG tablet Take 1 tablet (20 mg total) by mouth daily. 11/04/17   Malva Limes, MD  metFORMIN (GLUCOPHAGE) 500 MG tablet Take 1 tablet (500 mg total) by mouth 2 (two) times daily with a meal. 04/15/17   Malva Limes, MD  metoCLOPramide (REGLAN) 10 MG tablet Take 1 tablet (10 mg total) by mouth 3 (three) times daily with meals. 11/30/17 11/30/18  Auburn Bilberry, MD  metoprolol succinate (TOPROL-XL) 25 MG 24 hr tablet Take 50 mg by mouth daily. 04/06/18   [provider]  Oxycodone HCl 10 MG TABS Take 1 tablet (10 mg total) by mouth every 6 (six) hours as needed (pain). Patient taking differently: Take 10 mg by mouth as needed (pain). Can take 4-7 tablets per day 11/30/17   Auburn Bilberry, MD  pantoprazole (PROTONIX) 40 MG tablet TAKE 1 TABLET BY MOUTH TWICE A DAY 12/04/17   Malva Limes, MD  promethazine (PHENERGAN) 12.5 MG tablet Take 1 tablet (12.5 mg total) by mouth every 6 (six) hours as needed for refractory nausea / vomiting. 11/30/17   Auburn Bilberry, MD  protein supplement shake (PREMIER PROTEIN) LIQD Take 325 mLs (11 oz total) by mouth 3 (three) times daily between meals. 03/18/17   Enedina Finner, MD  ranitidine (ZANTAC) 150 MG tablet Take 1 tablet (150 mg total) by mouth at bedtime. 10/11/17   Malva Limes, MD  rosuvastatin (CRESTOR) 10 MG tablet TAKE 1 TABLET BY MOUTH EVERY DAY Patient taking differently: Take 10 mg by mouth daily.  01/20/18   Malva Limes, MD  tiZANidine (ZANAFLEX) 4 MG tablet Take 4 mg by mouth 3 (three)  times daily.     [provider]  traZODone (DESYREL) 100 MG tablet Take 100 mg by mouth at bedtime. 10/12/16   [provider]  vitamin B-12 (CYANOCOBALAMIN) 1000 MCG tablet Take 1,000 mcg by mouth daily.    [provider]  vitamin C (ASCORBIC ACID) 500 MG tablet Take 500 mg by mouth daily.    [provider]  zolpidem (AMBIEN) 10 MG tablet Take 1 tablet by mouth at bedtime.  03/23/17   [provider]    Allergies Sulfasalazine; Acetaminophen-codeine; Atorvastatin; Codeine; Ibuprofen; Propoxyphene; Sulfa antibiotics; and Duloxetine  Family History  Problem Relation Age of Onset  . Arthritis Mother   . Depression Mother   . Diabetes Mother   . Heart disease  Mother   . Hyperlipidemia Mother   . Hypertension Mother   . Stroke Mother   . Arthritis Father   . Diabetes Father   . Hearing loss Father   . Heart disease Father   . Hyperlipidemia Father   . Hypertension Father     Social History Social History   Tobacco Use  . Smoking status: Never Smoker  . Smokeless tobacco: Former Neurosurgeon    Types: Snuff  . Tobacco comment: quit > 30 years ago  Substance Use Topics  . Alcohol use: Yes    Alcohol/week: 1.0 standard drinks    Types: 1 Standard drinks or equivalent per week    Comment: only on special occasions- wine  . Drug use: No    Review of Systems Constitutional: No fever/chills Eyes: No visual changes. ENT: No sore throat. No stiff neck no neck pain Cardiovascular: Denies chest pain. Respiratory: Denies shortness of breath. Gastrointestinal:   + vomiting.  No diarrhea.  No constipation. Genitourinary: Negative for dysuria. Musculoskeletal: Negative lower extremity swelling Skin: Negative for rash. Neurological: Negative for severe headaches, focal weakness or numbness.   ____________________________________________   PHYSICAL EXAM:  VITAL SIGNS: ED Triage Vitals [05/10/18 1711]  Enc Vitals Group     BP (!) 206/114      Pulse Rate 86     Resp 20     Temp 99.3 F (37.4 C)     Temp Source Oral     SpO2 99 %     Weight 287 lb (130.2 kg)     Height 5\' 5"  (1.651 m)     Head Circumference      Peak Flow      Pain Score 9     Pain Loc      Pain Edu?      Excl. in GC?     Constitutional: Alert and oriented. Well appearing and in no acute distress. Eyes: Conjunctivae are normal Head: Atraumatic HEENT: No congestion/rhinnorhea. Mucous membranes are moist.  Oropharynx non-erythematous Neck:   Nontender with no meningismus, no masses, no stridor Cardiovascular: Normal rate, regular rhythm. Grossly normal heart sounds.  Good peripheral circulation. Respiratory: Normal respiratory effort.  No retractions. Lungs CTAB. Abdominal: Soft and nontender. No distention. No guarding no rebound orbitally obese.  No hernia palpated. Back:  There is no focal tenderness or step off.  there is no midline tenderness there are no lesions noted. there is no CVA tenderness  Musculoskeletal: No lower extremity tenderness, no upper extremity tenderness. No joint effusions, no DVT signs strong distal pulses no edema Neurologic:  Normal speech and language. No gross focal neurologic deficits are appreciated.  Skin:  Skin is warm, dry and intact. No rash noted. Psychiatric: Mood and affect are anxious. Speech and behavior are normal.  ____________________________________________   LABS (all labs ordered are listed, but only abnormal results are displayed)  Labs Reviewed  CBC  URINALYSIS, COMPLETE (UACMP) WITH MICROSCOPIC  DRUG SCREEN 10 W/CONF, SERUM  COMPREHENSIVE METABOLIC PANEL  LIPASE, BLOOD  TROPONIN I    Pertinent labs  results that were available during my care of the patient were reviewed by me and considered in my medical decision making (see chart for details). ____________________________________________  EKG  I personally interpreted any EKGs ordered by me or triage Sinus rhythm rate 90 bpm no acute  ST elevation or depression normal axis unremarkable EKG ____________________________________________  RADIOLOGY  Pertinent labs & imaging results that were available during my care of the patient  were reviewed by me and considered in my medical decision making (see chart for details). If possible, patient and/or family made aware of any abnormal findings.  Dg Chest 2 View  Result Date: 05/10/2018 CLINICAL DATA:  Left chest pain within inspiration beginning this afternoon. Headache, nausea and vomiting. EXAM: CHEST - 2 VIEW COMPARISON:  Chest radiograph July 19, 2017 FINDINGS: Cardiac silhouette is mildly enlarged. Mediastinal silhouette is not suspicious. Mild bronchitic changes without pleural effusion or focal consolidation. No pneumothorax. Large body habitus. Osseous structures are non suspicious. IMPRESSION: 1. Bronchitic changes without focal consolidation. 2. Mild cardiomegaly. Electronically Signed   By: Awilda Metroourtnay  Bloomer M.D.   On: 05/10/2018 18:59   ____________________________________________    PROCEDURES  Procedure(s) performed: None  Procedures  Critical Care performed: None  ____________________________________________   INITIAL IMPRESSION / ASSESSMENT AND PLAN / ED COURSE  Pertinent labs & imaging results that were available during my care of the patient were reviewed by me and considered in my medical decision making (see chart for details).   Patient here with nausea and vomiting again, she states it is her gastroparesis and she states she is anxious about the holidays which makes her gastroparesis worse.  Reassuringly, her abdomen is benign.  She is morbidly obese but I do not palpate anything that suggests appendicitis gallbladder disease obstruction or other acute intra-abdominal pathology.  Although she is initially quite anxious she is calming down as she calms down her blood pressure is coming down with that.  I do not see a clear indication to suggest that  she has hypertensive urgency.  There are some compliance issues she states with her blood pressure medications but blood pressure in the 160s for her is likely not bad.  Patient does have morbid obesity, despite chronic gastroparesis complaints.  However, at this time her abdomen appears to be benign.  I do not see any evidence of dissection or ACS, nor do I see any evidence of SI or HI consistent with the need for acute psychiatric intervention.  I tried IM Haldol initially we could not get a line but now that we have an IV will give her IV Haldol she still complaining of nausea.  Patient has not actually vomited once and she has been here.  It is my hope that we can get her feeling better and get her home. Labs pending. Signed out to dr. Derrill KayGoodman at the end of my shift.    ____________________________________________   FINAL CLINICAL IMPRESSION(S) / ED DIAGNOSES  Final diagnoses:  None      This chart was dictated using voice recognition software.  Despite best efforts to proofread,  errors can occur which can change meaning.       Jeanmarie PlantMcShane, Julieanne Hadsall A, MD 05/10/18 2044

## 2018-05-10 NOTE — ED Provider Notes (Signed)
Labwork without concerning findings. I discussed the findings with the patient. Will plan on discharging with bentyl and zofran. Discussed importance of follow up with her PCP and GI which she states is at [facility].    [facility] SemenGoodman, Salimah Martinovich, MD 05/10/18 2250

## 2018-05-10 NOTE — ED Notes (Signed)
Flushed in the face

## 2018-05-10 NOTE — ED Notes (Signed)
Pt redirected to bed and connected to monitor.

## 2018-05-10 NOTE — ED Triage Notes (Signed)
Pt presents to ED via [facility] from home c/o 9/10 L-sided chest pain worse with inspiration starting 1400 today with headache and n/v. EMS report initial BP 250/120, given 2" nitro paste. Paste patch fell off on arrival to ED. Pt states she is on 4 different BP meds but thinks she probably vomited them up today.

## 2018-05-10 NOTE — Discharge Instructions (Signed)
Please seek medical attention for any high fevers, chest pain, shortness of breath, change in behavior, persistent vomiting, bloody stool or any other new or concerning symptoms.  

## 2018-05-10 NOTE — ED Notes (Signed)
Pt took off leads connecting her to monitor and getting out of bed stating that she can't sit still. Ambulating to chair in room at this time.

## 2018-05-10 NOTE — ED Notes (Signed)
Patient transported to X-ray 

## 2018-05-12 ENCOUNTER — Other Ambulatory Visit: Payer: Self-pay | Admitting: Family Medicine

## 2018-05-12 DIAGNOSIS — E119 Type 2 diabetes mellitus without complications: Secondary | ICD-10-CM

## 2018-05-12 DIAGNOSIS — I10 Essential (primary) hypertension: Secondary | ICD-10-CM

## 2018-05-12 LAB — DRUG SCREEN 10 W/CONF, SERUM

## 2018-05-16 DIAGNOSIS — M5030 Other cervical disc degeneration, unspecified cervical region: Secondary | ICD-10-CM | POA: Diagnosis not present

## 2018-05-16 DIAGNOSIS — M5136 Other intervertebral disc degeneration, lumbar region: Secondary | ICD-10-CM | POA: Diagnosis not present

## 2018-05-16 DIAGNOSIS — M5481 Occipital neuralgia: Secondary | ICD-10-CM | POA: Diagnosis not present

## 2018-05-16 DIAGNOSIS — Z5181 Encounter for therapeutic drug level monitoring: Secondary | ICD-10-CM | POA: Diagnosis not present

## 2018-05-17 LAB — BENZODIAZEPINES,MS,WB/SP RFX: BENZODIAZEPINES CONFIRM: UNDETERMINED

## 2018-05-17 LAB — DRUG SCREEN 10 W/CONF, SERUM
Amphetamines, IA: NEGATIVE ng/mL
Barbiturates, IA: NEGATIVE ug/mL
COCAINE & METABOLITE, IA: NEGATIVE ng/mL
METHADONE, IA: NEGATIVE ng/mL
OPIATES, IA: NEGATIVE ng/mL
PHENCYCLIDINE, IA: NEGATIVE ng/mL
PROPOXYPHENE, IA: NEGATIVE ng/mL
THC(Marijuana) Metabolite, IA: NEGATIVE ng/mL

## 2018-05-17 LAB — OXYCODONES,MS,WB/SP RFX: OXYCODONES CONFIRMATION: UNDETERMINED

## 2018-05-26 ENCOUNTER — Encounter: Payer: Self-pay | Admitting: Physician Assistant

## 2018-05-26 ENCOUNTER — Ambulatory Visit (INDEPENDENT_AMBULATORY_CARE_PROVIDER_SITE_OTHER): Payer: Medicare Other | Admitting: Physician Assistant

## 2018-05-26 VITALS — BP 93/62 | HR 69 | Temp 98.4°F | Resp 16 | Wt 274.6 lb

## 2018-05-26 DIAGNOSIS — K219 Gastro-esophageal reflux disease without esophagitis: Secondary | ICD-10-CM

## 2018-05-26 DIAGNOSIS — R350 Frequency of micturition: Secondary | ICD-10-CM | POA: Diagnosis not present

## 2018-05-26 DIAGNOSIS — B373 Candidiasis of vulva and vagina: Secondary | ICD-10-CM

## 2018-05-26 DIAGNOSIS — E1169 Type 2 diabetes mellitus with other specified complication: Secondary | ICD-10-CM

## 2018-05-26 DIAGNOSIS — B3731 Acute candidiasis of vulva and vagina: Secondary | ICD-10-CM

## 2018-05-26 LAB — POCT URINALYSIS DIPSTICK
Bilirubin, UA: NEGATIVE
Glucose, UA: NEGATIVE
Ketones, UA: NEGATIVE
Leukocytes, UA: NEGATIVE
Nitrite, UA: NEGATIVE
Odor: POSITIVE
Protein, UA: NEGATIVE
Spec Grav, UA: 1.015 (ref 1.010–1.025)
Urobilinogen, UA: 0.2 E.U./dL
pH, UA: 6 (ref 5.0–8.0)

## 2018-05-26 MED ORDER — FLUCONAZOLE 150 MG PO TABS: ORAL_TABLET | ORAL | 0 refills | Status: DC

## 2018-05-26 MED ORDER — FAMOTIDINE 20 MG PO TABS: 20.0000 mg | ORAL_TABLET | Freq: Every day | ORAL | 0 refills | Status: DC

## 2018-05-26 MED ORDER — CIPROFLOXACIN HCL 250 MG PO TABS: 250.0000 mg | ORAL_TABLET | Freq: Two times a day (BID) | ORAL | 0 refills | Status: DC

## 2018-05-26 NOTE — Patient Instructions (Signed)

## 2018-05-26 NOTE — Progress Notes (Signed)
Patient: Daisy [name] Female    DOB: Oct 18, 1965   52 y.o.   MRN: [id number] Visit Date: 05/26/2018  Today's Provider: Trey Sailors, PA-C   Chief Complaint  Patient presents with  . Urinary Frequency   Subjective:   Hx of Type II DM with below symptoms. Takes zanaflex PRN for muscle spasms.   Urinary Frequency   This is a new problem. The current episode started 1 to 4 weeks ago (since last week). The problem occurs every urination. The problem has been gradually worsening. The patient is experiencing no pain. There has been no fever. She is not sexually active. There is no history of pyelonephritis. Associated symptoms include frequency and urgency. Pertinent negatives include no discharge. Associated symptoms comments: Back Pain, straining to urinate, odor. She has tried increased fluids (cranberry juice) for the symptoms. The treatment provided no relief. Her past medical history is significant for kidney stones.    Allergies  Allergen Reactions  . Sulfasalazine Hives  . Acetaminophen-Codeine Hives  . Atorvastatin Other (See Comments)    Muscle pain  . Codeine Hives  . Ibuprofen Nausea And Vomiting  . Propoxyphene Other (See Comments)    Reaction:  GI upset   . Sulfa Antibiotics Hives  . Duloxetine Rash and Other (See Comments)    Yellowing of the skin     Current Outpatient Medications:  .  albuterol (PROVENTIL HFA;VENTOLIN HFA) 108 (90 Base) MCG/ACT inhaler, Inhale 2 puffs into the lungs every 6 (six) hours as needed for wheezing or shortness of breath., Disp: 18 g, Rfl: 3 .  alprazolam (XANAX) 2 MG tablet, Take 2 mg by mouth 3 (three) times daily. , Disp: , Rfl:  .  amLODipine (NORVASC) 5 MG tablet, Take 1 tablet (5 mg total) by mouth daily., Disp: 30 tablet, Rfl: 12 .  cetirizine (ZYRTEC) 10 MG tablet, Take 1 tablet (10 mg total) by mouth daily., Disp: 30 tablet, Rfl: 11 .  cholecalciferol (VITAMIN D) 1000 units tablet, Take 1,000 Units by mouth daily.,  Disp: , Rfl:  .  cloNIDine (CATAPRES - DOSED IN MG/24 HR) 0.1 mg/24hr patch, Place 1 patch (0.1 mg total) onto the skin once a week., Disp: 4 patch, Rfl: 12 .  dicyclomine (BENTYL) 20 MG tablet, Take 1 tablet (20 mg total) by mouth 3 (three) times daily as needed (abdominal pain)., Disp: 30 tablet, Rfl: 0 .  FLUoxetine (PROZAC) 20 MG capsule, Take 1 capsule by mouth daily., Disp: , Rfl:  .  fluticasone (FLONASE) 50 MCG/ACT nasal spray, Place 2 sprays into both nostrils daily., Disp: 16 g, Rfl: 6 .  furosemide (LASIX) 20 MG tablet, Take 1 tablet (20 mg total) by mouth daily., Disp: 30 tablet, Rfl: 12 .  lamoTRIgine (LAMICTAL) 100 MG tablet, Take 1 tablet by mouth daily., Disp: , Rfl:  .  lisinopril (PRINIVIL,ZESTRIL) 20 MG tablet, Take 1 tablet (20 mg total) by mouth daily., Disp: 3 tablet, Rfl: 0 .  metFORMIN (GLUCOPHAGE) 500 MG tablet, Take 1 tablet by mouth twice daily with meals, Disp: 60 tablet, Rfl: 11 .  metoCLOPramide (REGLAN) 10 MG tablet, Take 1 tablet (10 mg total) by mouth 3 (three) times daily with meals., Disp: 90 tablet, Rfl: 11 .  metoprolol succinate (TOPROL-XL) 25 MG 24 hr tablet, Take 50 mg by mouth daily., Disp: , Rfl:  .  ondansetron (ZOFRAN) 4 MG tablet, Take 1 tablet (4 mg total) by mouth every 8 (eight) hours as needed for  nausea or vomiting., Disp: 20 tablet, Rfl: 0 .  Oxycodone HCl 10 MG TABS, Take 1 tablet (10 mg total) by mouth every 6 (six) hours as needed (pain). (Patient taking differently: Take 10 mg by mouth as needed (pain). Can take 4-7 tablets per day), Disp: 40 tablet, Rfl: 0 .  pantoprazole (PROTONIX) 40 MG tablet, TAKE 1 TABLET BY MOUTH TWICE A DAY, Disp: 180 tablet, Rfl: 4 .  potassium chloride SA (K-DUR,KLOR-CON) 20 MEQ tablet, Take 1 tablet by mouth every day with furosemide, Disp: 30 tablet, Rfl: 11 .  promethazine (PHENERGAN) 12.5 MG tablet, Take 1 tablet (12.5 mg total) by mouth every 6 (six) hours as needed for refractory nausea / vomiting., Disp: 30  tablet, Rfl: 3 .  protein supplement shake (PREMIER PROTEIN) LIQD, Take 325 mLs (11 oz total) by mouth 3 (three) times daily between meals., Disp: 30 Can, Rfl: 0 .  rosuvastatin (CRESTOR) 10 MG tablet, TAKE 1 TABLET BY MOUTH EVERY DAY (Patient taking differently: Take 10 mg by mouth daily. ), Disp: 90 tablet, Rfl: 4 .  tiZANidine (ZANAFLEX) 4 MG tablet, Take 4 mg by mouth 3 (three) times daily. , Disp: , Rfl:  .  traZODone (DESYREL) 100 MG tablet, Take 100 mg by mouth at bedtime., Disp: , Rfl:  .  vitamin B-12 (CYANOCOBALAMIN) 1000 MCG tablet, Take 1,000 mcg by mouth daily., Disp: , Rfl:  .  vitamin C (ASCORBIC ACID) 500 MG tablet, Take 500 mg by mouth daily., Disp: , Rfl:  .  zolpidem (AMBIEN) 10 MG tablet, Take 1 tablet by mouth at bedtime. , Disp: , Rfl:  .  ciprofloxacin (CIPRO) 250 MG tablet, Take 1 tablet (250 mg total) by mouth 2 (two) times daily., Disp: 6 tablet, Rfl: 0 .  famotidine (PEPCID) 20 MG tablet, Take 1 tablet (20 mg total) by mouth at bedtime., Disp: 90 tablet, Rfl: 0 .  fluconazole (DIFLUCAN) 150 MG tablet, Take one pill on day 1. Take a second pill three days later if still symptomatic., Disp: 1 tablet, Rfl: 0  Review of Systems  Genitourinary: Positive for frequency and urgency.  Musculoskeletal: Positive for back pain.    Social History   Tobacco Use  . Smoking status: Never Smoker  . Smokeless tobacco: Former NeurosurgeonUser    Types: Snuff  . Tobacco comment: quit > 30 years ago  Substance Use Topics  . Alcohol use: Yes    Alcohol/week: 1.0 standard drinks    Types: 1 Standard drinks or equivalent per week    Comment: only on special occasions- wine      Objective:   BP 93/62 (BP Location: Left Arm, Patient Position: Sitting, Cuff Size: Large)   Pulse 69   Temp 98.4 F (36.9 C) (Oral)   Resp 16   Wt 274 lb 9.6 oz (124.6 kg)   BMI 45.70 kg/m  Vitals:   05/26/18 1055  BP: 93/62  Pulse: 69  Resp: 16  Temp: 98.4 F (36.9 C)  TempSrc: Oral  Weight: 274 lb  9.6 oz (124.6 kg)     Physical Exam Constitutional:      General: She is not in acute distress.    Appearance: She is well-developed. She is not diaphoretic.  Cardiovascular:     Rate and Rhythm: Normal rate and regular rhythm.  Pulmonary:     Effort: Pulmonary effort is normal.     Breath sounds: Normal breath sounds.  Abdominal:     General: Bowel sounds are normal. There is  no distension.     Palpations: Abdomen is soft.     Tenderness: There is abdominal tenderness in the suprapubic area. There is no guarding or rebound.  Skin:    General: Skin is warm and dry.  Neurological:     Mental Status: She is alert and oriented to person, place, and time.  Psychiatric:        Behavior: Behavior normal.         Assessment & Plan    1. Urinary frequency  Will tx with Cipro based on allergies. She does take zanaflex which interacts with cipro but not every day and she is able to forego this for three days.  - POCT urinalysis dipstick - ciprofloxacin (CIPRO) 250 MG tablet; Take 1 tablet (250 mg total) by mouth 2 (two) times daily.  Dispense: 6 tablet; Refill: 0  2. Gastroesophageal reflux disease without esophagitis  - famotidine (PEPCID) 20 MG tablet; Take 1 tablet (20 mg total) by mouth at bedtime.  Dispense: 90 tablet; Refill: 0  3. Vaginal candida  - fluconazole (DIFLUCAN) 150 MG tablet; Take one pill on day 1. Take a second pill three days later if still symptomatic.  Dispense: 1 tablet; Refill: 0  4. Type 2 diabetes mellitus with other specified complication, without long-term current use of insulin (HCC)  Controlled.  Return if symptoms worsen or fail to improve.  The entirety of the information documented in the History of Present Illness, Review of Systems and Physical Exam were personally obtained by me. Portions of this information were initially documented by Hetty Ely, CMA and reviewed by me for thoroughness and accuracy.           Trey Sailors, PA-C  [facility]

## 2018-06-12 DIAGNOSIS — M5481 Occipital neuralgia: Secondary | ICD-10-CM | POA: Diagnosis not present

## 2018-06-12 DIAGNOSIS — M5030 Other cervical disc degeneration, unspecified cervical region: Secondary | ICD-10-CM | POA: Diagnosis not present

## 2018-06-12 DIAGNOSIS — Z5181 Encounter for therapeutic drug level monitoring: Secondary | ICD-10-CM | POA: Diagnosis not present

## 2018-06-12 DIAGNOSIS — M5136 Other intervertebral disc degeneration, lumbar region: Secondary | ICD-10-CM | POA: Diagnosis not present

## 2018-06-22 ENCOUNTER — Other Ambulatory Visit: Payer: Self-pay | Admitting: Physician Assistant

## 2018-07-05 ENCOUNTER — Other Ambulatory Visit: Payer: Self-pay | Admitting: Family Medicine

## 2018-07-10 ENCOUNTER — Encounter: Payer: Self-pay | Admitting: Family Medicine

## 2018-07-10 ENCOUNTER — Ambulatory Visit (INDEPENDENT_AMBULATORY_CARE_PROVIDER_SITE_OTHER): Payer: Medicare Other | Admitting: Family Medicine

## 2018-07-10 VITALS — BP 112/68 | HR 80 | Temp 98.3°F | Resp 16 | Wt 283.0 lb

## 2018-07-10 DIAGNOSIS — B373 Candidiasis of vulva and vagina: Secondary | ICD-10-CM | POA: Diagnosis not present

## 2018-07-10 DIAGNOSIS — N3090 Cystitis, unspecified without hematuria: Secondary | ICD-10-CM | POA: Diagnosis not present

## 2018-07-10 DIAGNOSIS — B3731 Acute candidiasis of vulva and vagina: Secondary | ICD-10-CM

## 2018-07-10 DIAGNOSIS — R350 Frequency of micturition: Secondary | ICD-10-CM | POA: Diagnosis not present

## 2018-07-10 LAB — POCT URINALYSIS DIPSTICK
Bilirubin, UA: NEGATIVE
GLUCOSE UA: NEGATIVE
KETONES UA: NEGATIVE
Leukocytes, UA: NEGATIVE
Nitrite, UA: NEGATIVE
PROTEIN UA: NEGATIVE
SPEC GRAV UA: 1.015 (ref 1.010–1.025)
Urobilinogen, UA: 0.2 E.U./dL
pH, UA: 5 (ref 5.0–8.0)

## 2018-07-10 MED ORDER — CIPROFLOXACIN HCL 250 MG PO TABS: 250.0000 mg | ORAL_TABLET | Freq: Two times a day (BID) | ORAL | 0 refills | Status: AC

## 2018-07-10 MED ORDER — FLUCONAZOLE 150 MG PO TABS: ORAL_TABLET | ORAL | 0 refills | Status: DC

## 2018-07-10 NOTE — Progress Notes (Signed)
Patient: Daisy GoslingDanita A Sobel Female    DOB: 29-Jan-1966   53 y.o.   MRN: [id number] Visit Date: 07/10/2018  Today's Provider: Mila Merryonald Lindsey Hommel, MD   Chief Complaint  Patient presents with  . Urinary Tract Infection    Started about a week ago.   Subjective:     Urinary Tract Infection   This is a recurrent problem. The current episode started in the past 7 days. The problem has been gradually worsening. There has been no fever. Associated symptoms include chills, frequency and urgency. Pertinent negatives include no discharge, hematuria, hesitancy or nausea. She has tried increased fluids for the symptoms. The treatment provided mild relief.  Had similar symptoms in December and treated with brief course of ciprofloxacin. Sx resolved, but returned about 4 days ago.   Allergies  Allergen Reactions  . Sulfasalazine Hives  . Acetaminophen-Codeine Hives  . Atorvastatin Other (See Comments)    Muscle pain  . Codeine Hives  . Ibuprofen Nausea And Vomiting  . Propoxyphene Other (See Comments)    Reaction:  GI upset   . Sulfa Antibiotics Hives  . Duloxetine Rash and Other (See Comments)    Yellowing of the skin     Current Outpatient Medications:  .  albuterol (PROVENTIL HFA;VENTOLIN HFA) 108 (90 Base) MCG/ACT inhaler, Inhale 2 puffs into the lungs every 6 (six) hours as needed for wheezing or shortness of breath., Disp: 18 g, Rfl: 3 .  alprazolam (XANAX) 2 MG tablet, Take 2 mg by mouth 3 (three) times daily. , Disp: , Rfl:  .  amLODipine (NORVASC) 5 MG tablet, Take 1 tablet (5 mg total) by mouth daily., Disp: 30 tablet, Rfl: 12 .  cetirizine (ZYRTEC) 10 MG tablet, Take 1 tablet (10 mg total) by mouth daily., Disp: 30 tablet, Rfl: 11 .  cholecalciferol (VITAMIN D) 1000 units tablet, Take 1,000 Units by mouth daily., Disp: , Rfl:  .  cloNIDine (CATAPRES - DOSED IN MG/24 HR) 0.1 mg/24hr patch, Place 1 patch (0.1 mg total) onto the skin once a week., Disp: 4 patch, Rfl: 12 .   dicyclomine (BENTYL) 20 MG tablet, Take 1 tablet (20 mg total) by mouth 3 (three) times daily as needed (abdominal pain)., Disp: 30 tablet, Rfl: 0 .  famotidine (PEPCID) 20 MG tablet, Take 1 tablet (20 mg total) by mouth daily., Disp: 90 tablet, Rfl: 3 .  FLUoxetine (PROZAC) 20 MG capsule, Take 1 capsule by mouth daily., Disp: , Rfl:  .  fluticasone (FLONASE) 50 MCG/ACT nasal spray, Place 2 sprays into both nostrils daily., Disp: 16 g, Rfl: 6 .  furosemide (LASIX) 20 MG tablet, Take 1 tablet (20 mg total) by mouth daily., Disp: 30 tablet, Rfl: 12 .  lamoTRIgine (LAMICTAL) 100 MG tablet, Take 1 tablet by mouth daily., Disp: , Rfl:  .  lisinopril (PRINIVIL,ZESTRIL) 20 MG tablet, Take 1 tablet (20 mg total) by mouth daily., Disp: 3 tablet, Rfl: 0 .  metFORMIN (GLUCOPHAGE) 500 MG tablet, Take 1 tablet by mouth twice daily with meals, Disp: 60 tablet, Rfl: 11 .  metoCLOPramide (REGLAN) 10 MG tablet, Take 1 tablet (10 mg total) by mouth 3 (three) times daily with meals., Disp: 90 tablet, Rfl: 11 .  metoprolol succinate (TOPROL-XL) 25 MG 24 hr tablet, Take 2 tablets by mouth every day, Disp: 180 tablet, Rfl: 3 .  ondansetron (ZOFRAN) 4 MG tablet, Take 1 tablet (4 mg total) by mouth every 8 (eight) hours as needed for nausea or  vomiting., Disp: 20 tablet, Rfl: 0 .  Oxycodone HCl 10 MG TABS, Take 1 tablet (10 mg total) by mouth every 6 (six) hours as needed (pain). (Patient taking differently: Take 10 mg by mouth as needed (pain). Can take 4-7 tablets per day), Disp: 40 tablet, Rfl: 0 .  potassium chloride SA (K-DUR,KLOR-CON) 20 MEQ tablet, Take 1 tablet by mouth every day with furosemide, Disp: 30 tablet, Rfl: 11 .  promethazine (PHENERGAN) 12.5 MG tablet, Take 1 tablet (12.5 mg total) by mouth every 6 (six) hours as needed for refractory nausea / vomiting., Disp: 30 tablet, Rfl: 3 .  protein supplement shake (PREMIER PROTEIN) LIQD, Take 325 mLs (11 oz total) by mouth 3 (three) times daily between meals.,  Disp: 30 Can, Rfl: 0 .  rosuvastatin (CRESTOR) 10 MG tablet, TAKE 1 TABLET BY MOUTH EVERY DAY (Patient taking differently: Take 10 mg by mouth daily. ), Disp: 90 tablet, Rfl: 4 .  tiZANidine (ZANAFLEX) 4 MG tablet, Take 4 mg by mouth 3 (three) times daily. , Disp: , Rfl:  .  traZODone (DESYREL) 100 MG tablet, Take 100 mg by mouth at bedtime., Disp: , Rfl:  .  vitamin B-12 (CYANOCOBALAMIN) 1000 MCG tablet, Take 1,000 mcg by mouth daily., Disp: , Rfl:  .  vitamin C (ASCORBIC ACID) 500 MG tablet, Take 500 mg by mouth daily., Disp: , Rfl:  .  zolpidem (AMBIEN) 10 MG tablet, Take 1 tablet by mouth at bedtime. , Disp: , Rfl:  .  ciprofloxacin (CIPRO) 250 MG tablet, Take 1 tablet (250 mg total) by mouth 2 (two) times daily., Disp: 6 tablet, Rfl: 0 .  fluconazole (DIFLUCAN) 150 MG tablet, Take one pill on day 1. Take a second pill three days later if still symptomatic., Disp: 1 tablet, Rfl: 0  Review of Systems  Constitutional: Positive for chills and fatigue. Negative for activity change, appetite change, diaphoresis, fever and unexpected weight change.  Gastrointestinal: Negative.  Negative for nausea.  Genitourinary: Positive for frequency and urgency. Negative for decreased urine volume, difficulty urinating, dyspareunia, dysuria, hematuria, hesitancy, vaginal bleeding, vaginal discharge and vaginal pain.  Musculoskeletal: Positive for back pain (Chronic issue but is increasing.).  Neurological: Negative for dizziness, light-headedness and headaches.    Social History   Tobacco Use  . Smoking status: Never Smoker  . Smokeless tobacco: Former NeurosurgeonUser    Types: Snuff  . Tobacco comment: quit > 30 years ago  Substance Use Topics  . Alcohol use: Yes    Alcohol/week: 1.0 standard drinks    Types: 1 Standard drinks or equivalent per week    Comment: only on special occasions- wine      Objective:   BP 112/68 (BP Location: Right Arm, Patient Position: Sitting, Cuff Size: Large)   Pulse 80    Temp 98.3 F (36.8 C) (Oral)   Resp 16   Wt 283 lb (128.4 kg)   BMI 47.09 kg/m  Vitals:   07/10/18 1057  BP: 112/68  Pulse: 80  Resp: 16  Temp: 98.3 F (36.8 C)  TempSrc: Oral  Weight: 283 lb (128.4 kg)     Physical Exam   General Appearance:    Alert, cooperative, no distress  Eyes:    PERRL, conjunctiva/corneas clear, EOM's intact       Lungs:     Clear to auscultation bilaterally, respirations unlabored  Heart:    Regular rate and rhythm  Neurologic:   Awake, alert, oriented x 3. No apparent focal neurological  defect.       Results for orders placed or performed in visit on 07/10/18  POCT urinalysis dipstick  Result Value Ref Range   Color, UA     Clarity, UA     Glucose, UA Negative Negative   Bilirubin, UA Negative    Ketones, UA Negative    Spec Grav, UA 1.015 1.010 - 1.025   Blood, UA Moderate    pH, UA 5.0 5.0 - 8.0   Protein, UA Negative Negative   Urobilinogen, UA 0.2 0.2 or 1.0 E.U./dL   Nitrite, UA Negative    Leukocytes, UA Negative Negative   Appearance     Odor         Assessment & Plan    1. Cystitis Previously responded well to Cipro. Will restart while awaiting culture results.  - ciprofloxacin (CIPRO) 250 MG tablet; Take 1 tablet (250 mg total) by mouth 2 (two) times daily for 7 days.  Dispense: 14 tablet; Refill: 0 - POCT urinalysis dipstick - CULTURE, URINE COMPREHENSIVE  2. Vaginal candida  - fluconazole (DIFLUCAN) 150 MG tablet; Take one pill on day 1. Take a second pill three days later if still symptomatic.  Dispense: 1 tablet; Refill: 0  3. Urinary frequency        Mila Merry, MD  [facility]

## 2018-07-10 NOTE — Patient Instructions (Signed)
.   Please review the attached list of medications and notify my office if there are any errors.   . Please bring all of your medications to every appointment so we can make sure that our medication list is the same as yours.   

## 2018-07-11 DIAGNOSIS — M5030 Other cervical disc degeneration, unspecified cervical region: Secondary | ICD-10-CM | POA: Diagnosis not present

## 2018-07-11 DIAGNOSIS — M5481 Occipital neuralgia: Secondary | ICD-10-CM | POA: Diagnosis not present

## 2018-07-11 DIAGNOSIS — M5136 Other intervertebral disc degeneration, lumbar region: Secondary | ICD-10-CM | POA: Diagnosis not present

## 2018-07-11 DIAGNOSIS — Z5181 Encounter for therapeutic drug level monitoring: Secondary | ICD-10-CM | POA: Diagnosis not present

## 2018-07-12 LAB — SPECIMEN STATUS REPORT

## 2018-07-12 LAB — CULTURE, URINE COMPREHENSIVE

## 2018-07-13 ENCOUNTER — Telehealth: Payer: Self-pay

## 2018-07-13 NOTE — Telephone Encounter (Signed)
Pt advised.   Thanks,   -Laura  

## 2018-07-13 NOTE — Telephone Encounter (Signed)
-----   Message from Malva Limes, MD sent at 07/13/2018  7:57 AM EST ----- Urine culture is consistent with mild UTI. Should resolve with ciprofloxacin that was prescribed. Call if not better when finished.

## 2018-07-27 ENCOUNTER — Other Ambulatory Visit: Payer: Self-pay | Admitting: Family Medicine

## 2018-08-02 ENCOUNTER — Other Ambulatory Visit: Payer: Self-pay | Admitting: Family Medicine

## 2018-08-02 DIAGNOSIS — I10 Essential (primary) hypertension: Secondary | ICD-10-CM

## 2018-08-08 DIAGNOSIS — M47897 Other spondylosis, lumbosacral region: Secondary | ICD-10-CM | POA: Diagnosis not present

## 2018-08-08 DIAGNOSIS — M5481 Occipital neuralgia: Secondary | ICD-10-CM | POA: Diagnosis not present

## 2018-08-08 DIAGNOSIS — E1140 Type 2 diabetes mellitus with diabetic neuropathy, unspecified: Secondary | ICD-10-CM | POA: Diagnosis not present

## 2018-08-08 DIAGNOSIS — G894 Chronic pain syndrome: Secondary | ICD-10-CM | POA: Diagnosis not present

## 2018-08-08 DIAGNOSIS — M48062 Spinal stenosis, lumbar region with neurogenic claudication: Secondary | ICD-10-CM | POA: Diagnosis not present

## 2018-08-11 ENCOUNTER — Other Ambulatory Visit: Payer: Self-pay | Admitting: Family Medicine

## 2018-08-11 DIAGNOSIS — I10 Essential (primary) hypertension: Secondary | ICD-10-CM

## 2018-08-11 MED ORDER — CLONIDINE 0.1 MG/24HR TD PTWK: 0.1000 mg | MEDICATED_PATCH | TRANSDERMAL | 4 refills | Status: DC

## 2018-08-11 NOTE — Telephone Encounter (Signed)
Please review. Pharmacy was updated. Thanks!  

## 2018-08-11 NOTE — Telephone Encounter (Signed)
[facility] faxed refill request for the following medications:  cloNIDine (CATAPRES - DOSED IN MG/24 HR) 0.1 mg/24hr patch    Please advise.

## 2018-08-17 ENCOUNTER — Other Ambulatory Visit: Payer: Self-pay | Admitting: Family Medicine

## 2018-08-17 DIAGNOSIS — I10 Essential (primary) hypertension: Secondary | ICD-10-CM

## 2018-08-17 MED ORDER — CLONIDINE 0.1 MG/24HR TD PTWK: 0.1000 mg | MEDICATED_PATCH | TRANSDERMAL | 4 refills | Status: DC

## 2018-08-17 NOTE — Telephone Encounter (Signed)
[facility] faxed refill request for the following medications:  cloNIDine (CATAPRES - DOSED IN MG/24 HR) 0.1 mg/24hr patch  Looks like Rx was sent on 08/11/2018. Please advise. Thanks TNP

## 2018-09-01 ENCOUNTER — Ambulatory Visit (INDEPENDENT_AMBULATORY_CARE_PROVIDER_SITE_OTHER): Payer: Medicare Other | Admitting: Family Medicine

## 2018-09-01 ENCOUNTER — Encounter: Payer: Self-pay | Admitting: Family Medicine

## 2018-09-01 ENCOUNTER — Other Ambulatory Visit: Payer: Self-pay

## 2018-09-01 VITALS — BP 110/68 | HR 80 | Temp 98.2°F | Ht 65.0 in | Wt 282.2 lb

## 2018-09-01 DIAGNOSIS — J301 Allergic rhinitis due to pollen: Secondary | ICD-10-CM | POA: Diagnosis not present

## 2018-09-01 DIAGNOSIS — R059 Cough, unspecified: Secondary | ICD-10-CM

## 2018-09-01 DIAGNOSIS — R05 Cough: Secondary | ICD-10-CM | POA: Diagnosis not present

## 2018-09-01 MED ORDER — AZITHROMYCIN 250 MG PO TABS: ORAL_TABLET | ORAL | 0 refills | Status: AC

## 2018-09-01 MED ORDER — ALBUTEROL SULFATE HFA 108 (90 BASE) MCG/ACT IN AERS: 2.0000 | INHALATION_SPRAY | Freq: Four times a day (QID) | RESPIRATORY_TRACT | 3 refills | Status: DC | PRN

## 2018-09-01 MED ORDER — MONTELUKAST SODIUM 10 MG PO TABS: 10.0000 mg | ORAL_TABLET | Freq: Every day | ORAL | 1 refills | Status: DC

## 2018-09-01 NOTE — Patient Instructions (Signed)
.   Please review the attached list of medications and notify my office if there are any errors.   . Please bring all of your medications to every appointment so we can make sure that our medication list is the same as yours.   

## 2018-09-01 NOTE — Progress Notes (Signed)
Patient: Daisy [name] Female    DOB: Apr 08, 1966   53 y.o.   MRN: [id number] Visit Date: 09/01/2018  Today's Provider: Mila Merry, MD   Chief Complaint  Patient presents with  . Cough    fatigue, vomiting since 08/30/18   Subjective:     Cough  This is a new problem. The current episode started in the past 7 days. The problem occurs every few minutes. The cough is non-productive. The symptoms are aggravated by lying down. The treatment provided no relief.  pt has tried benadryl and zyrtec and neither has helped.  Pt advised that when she lays down she has to sit up and most of the time she has to vomit.  All symptoms started on 08/30/18. Not coughing up any mucous, but is having sneezing and rhinorrhea. She is taking Zyrtec, but has been out of her albuterol inhaler for about a month.  She does have history of diabetic gastroparesis and has had ondansetron changed to Reglan.   Allergies  Allergen Reactions  . Sulfasalazine Hives  . Acetaminophen-Codeine Hives  . Atorvastatin Other (See Comments)    Muscle pain  . Codeine Hives  . Ibuprofen Nausea And Vomiting  . Propoxyphene Other (See Comments)    Reaction:  GI upset   . Sulfa Antibiotics Hives  . Duloxetine Rash and Other (See Comments)    Yellowing of the skin     Current Outpatient Medications:  .  albuterol (PROVENTIL HFA;VENTOLIN HFA) 108 (90 Base) MCG/ACT inhaler, Inhale 2 puffs into the lungs every 6 (six) hours as needed for wheezing or shortness of breath., Disp: 18 g, Rfl: 3 .  alprazolam (XANAX) 2 MG tablet, Take 2 mg by mouth 3 (three) times daily. , Disp: , Rfl:  .  amLODipine (NORVASC) 5 MG tablet, Take 1 tablet (5 mg total) by mouth daily., Disp: 30 tablet, Rfl: 12 .  cetirizine (ZYRTEC) 10 MG tablet, Take 1 tablet (10 mg total) by mouth daily., Disp: 30 tablet, Rfl: 11 .  cholecalciferol (VITAMIN D) 1000 units tablet, Take 1,000 Units by mouth daily., Disp: , Rfl:  .  cloNIDine (CATAPRES - DOSED IN  MG/24 HR) 0.1 mg/24hr patch, Place 1 patch (0.1 mg total) onto the skin once a week., Disp: 12 patch, Rfl: 4 .  famotidine (PEPCID) 20 MG tablet, Take 1 tablet (20 mg total) by mouth daily., Disp: 90 tablet, Rfl: 3 .  FLUoxetine (PROZAC) 20 MG capsule, Take 1 capsule by mouth daily., Disp: , Rfl:  .  fluticasone (FLONASE) 50 MCG/ACT nasal spray, Place 2 sprays into both nostrils daily., Disp: 16 g, Rfl: 6 .  furosemide (LASIX) 20 MG tablet, Take 1 tablet (20 mg total) by mouth daily., Disp: 30 tablet, Rfl: 12 .  lamoTRIgine (LAMICTAL) 100 MG tablet, Take 1 tablet by mouth daily., Disp: , Rfl:  .  lisinopril (PRINIVIL,ZESTRIL) 20 MG tablet, Take 1.5 tablets by mouth every day, Disp: 45 tablet, Rfl: 11 .  metFORMIN (GLUCOPHAGE) 500 MG tablet, Take 1 tablet by mouth twice daily with meals, Disp: 60 tablet, Rfl: 11 .  metoCLOPramide (REGLAN) 10 MG tablet, Take 1 tablet (10 mg total) by mouth 3 (three) times daily with meals., Disp: 90 tablet, Rfl: 11 .  metoprolol succinate (TOPROL-XL) 25 MG 24 hr tablet, Take 2 tablets by mouth every day, Disp: 180 tablet, Rfl: 3 .  Oxycodone HCl 10 MG TABS, Take 1 tablet (10 mg total) by mouth every 6 (six)  hours as needed (pain). (Patient taking differently: Take 10 mg by mouth as needed (pain). Can take 4-7 tablets per day), Disp: 40 tablet, Rfl: 0 .  potassium chloride SA (K-DUR,KLOR-CON) 20 MEQ tablet, Take 1 tablet by mouth every day with furosemide, Disp: 30 tablet, Rfl: 11 .  promethazine (PHENERGAN) 12.5 MG tablet, Take 1 tablet by mouth every 6 hours as needed for refractory nausea/vomiting, Disp: 30 tablet, Rfl: 5 .  protein supplement shake (PREMIER PROTEIN) LIQD, Take 325 mLs (11 oz total) by mouth 3 (three) times daily between meals., Disp: 30 Can, Rfl: 0 .  rosuvastatin (CRESTOR) 10 MG tablet, TAKE 1 TABLET BY MOUTH EVERY DAY (Patient taking differently: Take 10 mg by mouth daily. ), Disp: 90 tablet, Rfl: 4 .  tiZANidine (ZANAFLEX) 4 MG tablet, Take 4 mg  by mouth 3 (three) times daily. , Disp: , Rfl:  .  traZODone (DESYREL) 100 MG tablet, Take 100 mg by mouth at bedtime., Disp: , Rfl:  .  vitamin B-12 (CYANOCOBALAMIN) 1000 MCG tablet, Take 1,000 mcg by mouth daily., Disp: , Rfl:  .  vitamin C (ASCORBIC ACID) 500 MG tablet, Take 500 mg by mouth daily., Disp: , Rfl:  .  zolpidem (AMBIEN) 10 MG tablet, Take 1 tablet by mouth at bedtime. , Disp: , Rfl:   .  Review of Systems  Constitutional: Positive for fatigue.  HENT: Negative.   Eyes: Negative.   Respiratory: Positive for cough.   Cardiovascular: Negative.   Gastrointestinal: Positive for vomiting. Abdominal pain: possible from hernia.  Endocrine: Negative.   Genitourinary: Negative.   Musculoskeletal: Negative.   Skin: Negative.   Allergic/Immunologic: Negative.   Neurological: Negative.   Hematological: Negative.   Psychiatric/Behavioral: Negative.     Social History   Tobacco Use  . Smoking status: Never Smoker  . Smokeless tobacco: Former Neurosurgeon    Types: Snuff  . Tobacco comment: quit > 30 years ago  Substance Use Topics  . Alcohol use: Yes    Alcohol/week: 1.0 standard drinks    Types: 1 Standard drinks or equivalent per week    Comment: only on special occasions- wine      Objective:   BP 110/68 (BP Location: Left Arm, Patient Position: Sitting, Cuff Size: Large)   Pulse 80   Temp 98.2 F (36.8 C) (Oral)   Ht 5\' 5"  (1.651 m)   Wt 282 lb 3.2 oz (128 kg)   SpO2 98%   BMI 46.96 kg/m  Vitals:   09/01/18 1454  BP: 110/68  Pulse: 80  Temp: 98.2 F (36.8 C)  TempSrc: Oral  SpO2: 98%  Weight: 282 lb 3.2 oz (128 kg)  Height: 5\' 5"  (1.651 m)    Physical Exam   General Appearance:    Alert, cooperative, no distress  HENT:   bilateral TM normal without fluid or infection, neck without nodes, sinuses nontender and nasal mucosa congested  Eyes:    PERRL, conjunctiva/corneas clear, EOM's intact       Lungs:     Clear to auscultation bilaterally, respirations  unlabored  Heart:    Regular rate and rhythm  Neurologic:   Awake, alert, oriented x 3. No apparent focal neurological           defect.          Assessment & Plan    1. Cough Likely secondary to allergies. Will start back on - albuterol (PROVENTIL HFA;VENTOLIN HFA) 108 (90 Base) MCG/ACT inhaler; Inhale 2 puffs into the  lungs every 6 (six) hours as needed for wheezing or shortness of breath.  Dispense: 18 g; Refill: 3 and- montelukast (SINGULAIR) 10 MG tablet; Take 1 tablet (10 mg total) by mouth at bedtime.  Dispense: 30 tablet; Refill: 1  Counseled regarding signs and symptoms of viral and bacterial respiratory infections. Advised her that she can start prescription for antibiotic if she develops any sign of bacterial infection, or if current symptoms last longer than 10 days.   - azithromycin (ZITHROMAX) 250 MG tablet; 2 by mouth today, then 1 daily for 4 days  Dispense: 6 tablet; Refill: 0  2. Seasonal allergic rhinitis due to pollen  - montelukast (SINGULAIR) 10 MG tablet; Take 1 tablet (10 mg total) by mouth at bedtime.  Dispense: 30 tablet; Refill: 1  Call if symptoms change or if not rapidly improving.        Mila Merry, MD  [facility]

## 2018-09-05 DIAGNOSIS — M5136 Other intervertebral disc degeneration, lumbar region: Secondary | ICD-10-CM | POA: Diagnosis not present

## 2018-09-05 DIAGNOSIS — M5030 Other cervical disc degeneration, unspecified cervical region: Secondary | ICD-10-CM | POA: Diagnosis not present

## 2018-09-05 DIAGNOSIS — M179 Osteoarthritis of knee, unspecified: Secondary | ICD-10-CM | POA: Diagnosis not present

## 2018-09-05 DIAGNOSIS — M545 Low back pain: Secondary | ICD-10-CM | POA: Diagnosis not present

## 2018-09-08 ENCOUNTER — Other Ambulatory Visit: Payer: Self-pay | Admitting: Family Medicine

## 2018-09-08 DIAGNOSIS — R059 Cough, unspecified: Secondary | ICD-10-CM

## 2018-09-08 DIAGNOSIS — R05 Cough: Secondary | ICD-10-CM

## 2018-09-08 DIAGNOSIS — J301 Allergic rhinitis due to pollen: Secondary | ICD-10-CM

## 2018-09-09 ENCOUNTER — Other Ambulatory Visit: Payer: Self-pay | Admitting: Family Medicine

## 2018-09-09 DIAGNOSIS — J301 Allergic rhinitis due to pollen: Secondary | ICD-10-CM

## 2018-09-09 DIAGNOSIS — R059 Cough, unspecified: Secondary | ICD-10-CM

## 2018-09-09 DIAGNOSIS — R05 Cough: Secondary | ICD-10-CM

## 2018-09-09 MED ORDER — MONTELUKAST SODIUM 10 MG PO TABS: 10.0000 mg | ORAL_TABLET | Freq: Every day | ORAL | 1 refills | Status: AC

## 2018-09-12 DIAGNOSIS — M5030 Other cervical disc degeneration, unspecified cervical region: Secondary | ICD-10-CM | POA: Diagnosis not present

## 2018-09-12 DIAGNOSIS — M5136 Other intervertebral disc degeneration, lumbar region: Secondary | ICD-10-CM | POA: Diagnosis not present

## 2018-09-12 DIAGNOSIS — M545 Low back pain: Secondary | ICD-10-CM | POA: Diagnosis not present

## 2018-09-12 DIAGNOSIS — M179 Osteoarthritis of knee, unspecified: Secondary | ICD-10-CM | POA: Diagnosis not present

## 2018-09-26 ENCOUNTER — Telehealth: Payer: Self-pay

## 2018-09-26 NOTE — Telephone Encounter (Signed)
Patient called asking about her follow up appointment for next week on 10/04/2018. She and her mom Arij Lydy) are scheduled to come in for Diabetes follow up. Patient wants to know if these appointments need to be rescheduled, or should she come in. She does not want to do a virtual visit.

## 2018-09-26 NOTE — Telephone Encounter (Signed)
Can postpone one 5-6 weeks.

## 2018-09-26 NOTE — Telephone Encounter (Signed)
Patient was advised. Patient stated she will call back and reschedule appts later.

## 2018-10-04 ENCOUNTER — Ambulatory Visit: Payer: Medicare Other | Admitting: Family Medicine

## 2018-10-06 ENCOUNTER — Other Ambulatory Visit: Payer: Self-pay | Admitting: Family Medicine

## 2018-10-10 DIAGNOSIS — M545 Low back pain: Secondary | ICD-10-CM | POA: Diagnosis not present

## 2018-10-10 DIAGNOSIS — M179 Osteoarthritis of knee, unspecified: Secondary | ICD-10-CM | POA: Diagnosis not present

## 2018-10-10 DIAGNOSIS — G894 Chronic pain syndrome: Secondary | ICD-10-CM | POA: Diagnosis not present

## 2018-10-10 DIAGNOSIS — M5030 Other cervical disc degeneration, unspecified cervical region: Secondary | ICD-10-CM | POA: Diagnosis not present

## 2018-10-10 DIAGNOSIS — M5136 Other intervertebral disc degeneration, lumbar region: Secondary | ICD-10-CM | POA: Diagnosis not present

## 2018-10-16 ENCOUNTER — Ambulatory Visit (INDEPENDENT_AMBULATORY_CARE_PROVIDER_SITE_OTHER): Payer: Medicare Other | Admitting: Family Medicine

## 2018-10-16 ENCOUNTER — Other Ambulatory Visit: Payer: Self-pay

## 2018-10-16 ENCOUNTER — Telehealth: Payer: Self-pay | Admitting: *Deleted

## 2018-10-16 ENCOUNTER — Ambulatory Visit: Payer: Medicare Other

## 2018-10-16 ENCOUNTER — Ambulatory Visit: Payer: Medicare Other | Attending: Family Medicine

## 2018-10-16 ENCOUNTER — Encounter: Payer: Self-pay | Admitting: Family Medicine

## 2018-10-16 VITALS — BP 112/77 | HR 90 | Temp 98.4°F | Wt 283.0 lb

## 2018-10-16 DIAGNOSIS — M79605 Pain in left leg: Secondary | ICD-10-CM | POA: Diagnosis not present

## 2018-10-16 DIAGNOSIS — M7989 Other specified soft tissue disorders: Secondary | ICD-10-CM

## 2018-10-16 NOTE — Progress Notes (Signed)
Patient: Daisy GoslingDanita A [name] Female    DOB: 10-Jul-1965   53 y.o.   MRN: [id number] Visit Date: 10/16/2018  Today's Provider: Mila Merryonald Moustafa Mossa, MD   Chief Complaint  Patient presents with  . Leg Pain    Left lower leg; comes and goes for about a month.   Subjective:     Leg Pain   There was no injury mechanism. Pain location: Left lower leg. The quality of the pain is described as aching. Pain course: Comes and goes. Associated symptoms include a loss of motion, muscle weakness and numbness. Pertinent negatives include no inability to bear weight, loss of sensation or tingling. She reports no foreign bodies present. The symptoms are aggravated by movement and weight bearing. The treatment provided no relief.  Swelling of and on with aching over the last month or two. Pain is in lateral and posterior aspect of leg.   Allergies  Allergen Reactions  . Sulfasalazine Hives  . Acetaminophen-Codeine Hives  . Atorvastatin Other (See Comments)    Muscle pain  . Codeine Hives  . Ibuprofen Nausea And Vomiting  . Propoxyphene Other (See Comments)    Reaction:  GI upset   . Sulfa Antibiotics Hives  . Duloxetine Rash and Other (See Comments)    Yellowing of the skin     Current Outpatient Medications:  .  albuterol (PROVENTIL HFA;VENTOLIN HFA) 108 (90 Base) MCG/ACT inhaler, Inhale 2 puffs into the lungs every 6 (six) hours as needed for wheezing or shortness of breath., Disp: 18 g, Rfl: 3 .  alprazolam (XANAX) 2 MG tablet, Take 2 mg by mouth 3 (three) times daily. , Disp: , Rfl:  .  amLODipine (NORVASC) 5 MG tablet, Take 1 tablet (5 mg total) by mouth daily., Disp: 30 tablet, Rfl: 12 .  cetirizine (ZYRTEC) 10 MG tablet, Take 1 tablet (10 mg total) by mouth daily., Disp: 30 tablet, Rfl: 11 .  cholecalciferol (VITAMIN D) 1000 units tablet, Take 1,000 Units by mouth daily., Disp: , Rfl:  .  cloNIDine (CATAPRES - DOSED IN MG/24 HR) 0.1 mg/24hr patch, Place 1 patch (0.1 mg total) onto the skin  once a week., Disp: 12 patch, Rfl: 4 .  famotidine (PEPCID) 20 MG tablet, Take 1 tablet (20 mg total) by mouth daily., Disp: 90 tablet, Rfl: 3 .  FLUoxetine (PROZAC) 20 MG capsule, Take 1 capsule by mouth daily., Disp: , Rfl:  .  fluticasone (FLONASE) 50 MCG/ACT nasal spray, Place 2 sprays into both nostrils daily., Disp: 16 g, Rfl: 6 .  furosemide (LASIX) 20 MG tablet, Take 1 tablet (20 mg total) by mouth daily., Disp: 30 tablet, Rfl: 12 .  lamoTRIgine (LAMICTAL) 100 MG tablet, Take 1 tablet by mouth daily., Disp: , Rfl:  .  lisinopril (PRINIVIL,ZESTRIL) 20 MG tablet, Take 1.5 tablets by mouth every day, Disp: 45 tablet, Rfl: 11 .  metFORMIN (GLUCOPHAGE) 500 MG tablet, Take 1 tablet by mouth twice daily with meals, Disp: 60 tablet, Rfl: 11 .  metoCLOPramide (REGLAN) 10 MG tablet, Take 1 tablet (10 mg total) by mouth 3 (three) times daily with meals., Disp: 90 tablet, Rfl: 11 .  metoprolol succinate (TOPROL-XL) 25 MG 24 hr tablet, Take 2 tablets by mouth every day, Disp: 180 tablet, Rfl: 3 .  montelukast (SINGULAIR) 10 MG tablet, Take 1 tablet (10 mg total) by mouth at bedtime., Disp: 90 tablet, Rfl: 1 .  Oxycodone HCl 10 MG TABS, Take 1 tablet (10 mg total) by  mouth every 6 (six) hours as needed (pain). (Patient taking differently: Take 10 mg by mouth as needed (pain). Can take 4-7 tablets per day), Disp: 40 tablet, Rfl: 0 .  pantoprazole (PROTONIX) 40 MG tablet, Take 1 tablet by mouth twice daily, Disp: 60 tablet, Rfl: 11 .  potassium chloride SA (K-DUR,KLOR-CON) 20 MEQ tablet, Take 1 tablet by mouth every day with furosemide, Disp: 30 tablet, Rfl: 11 .  promethazine (PHENERGAN) 12.5 MG tablet, Take 1 tablet by mouth every 6 hours as needed for refractory nausea/vomiting, Disp: 30 tablet, Rfl: 5 .  protein supplement shake (PREMIER PROTEIN) LIQD, Take 325 mLs (11 oz total) by mouth 3 (three) times daily between meals., Disp: 30 Can, Rfl: 0 .  rosuvastatin (CRESTOR) 10 MG tablet, TAKE 1 TABLET BY  MOUTH EVERY DAY (Patient taking differently: Take 10 mg by mouth daily. ), Disp: 90 tablet, Rfl: 4 .  tiZANidine (ZANAFLEX) 4 MG tablet, Take 4 mg by mouth 3 (three) times daily. , Disp: , Rfl:  .  traZODone (DESYREL) 100 MG tablet, Take 100 mg by mouth at bedtime., Disp: , Rfl:  .  vitamin B-12 (CYANOCOBALAMIN) 1000 MCG tablet, Take 1,000 mcg by mouth daily., Disp: , Rfl:  .  vitamin C (ASCORBIC ACID) 500 MG tablet, Take 500 mg by mouth daily., Disp: , Rfl:  .  zolpidem (AMBIEN) 10 MG tablet, Take 1 tablet by mouth at bedtime. , Disp: , Rfl:   Review of Systems  Constitutional: Negative.   Cardiovascular: Positive for leg swelling. Negative for chest pain and palpitations.  Musculoskeletal: Positive for arthralgias, gait problem, joint swelling and myalgias. Negative for back pain, neck pain and neck stiffness.  Neurological: Positive for numbness. Negative for tingling.    Social History   Tobacco Use  . Smoking status: Never Smoker  . Smokeless tobacco: Former Neurosurgeon    Types: Snuff  . Tobacco comment: quit > 30 years ago  Substance Use Topics  . Alcohol use: Yes    Alcohol/week: 1.0 standard drinks    Types: 1 Standard drinks or equivalent per week    Comment: only on special occasions- wine      Objective:   BP 112/77 (BP Location: Right Arm, Patient Position: Sitting, Cuff Size: Large)   Pulse 90   Temp 98.4 F (36.9 C) (Oral)   Wt 283 lb (128.4 kg)   BMI 47.09 kg/m  Vitals:   10/16/18 1341  BP: 112/77  Pulse: 90  Temp: 98.4 F (36.9 C)  TempSrc: Oral  Weight: 283 lb (128.4 kg)     Physical Exam  General appearance: alert, well developed, well nourished, cooperative and in no distress Head: Normocephalic, without obvious abnormality, atraumatic Respiratory: Respirations even and unlabored, normal respiratory rate Extremities: Mild tenderness left posterior calf, no erythema. Slightly tender.      Assessment & Plan    1. Left leg swelling  - US Venous  Img Lower Unilateral Left; Future  2. Left leg pain  - US Venous Img Lower Unilateral Left; Future     Mila Merry, MD  [facility]

## 2018-10-16 NOTE — Patient Instructions (Signed)
.   Please review the attached list of medications and notify my office if there are any errors.   . Please bring all of your medications to every appointment so we can make sure that our medication list is the same as yours.   

## 2018-10-16 NOTE — Telephone Encounter (Signed)
[facility] called and stated patient missed her 3:00 pm appt today.

## 2018-10-19 ENCOUNTER — Telehealth: Payer: Self-pay | Admitting: Family Medicine

## 2018-10-19 NOTE — Telephone Encounter (Signed)
Pt had an order for [place] for venous image unilat left but it expires on the 11th but the pt cant go until the 13.  Can you change the dates on the order or resubmit  CB# [id number]  Barth Kirks

## 2018-10-20 ENCOUNTER — Ambulatory Visit: Payer: Medicare Other

## 2018-10-20 ENCOUNTER — Ambulatory Visit: Admission: RE | Admit: 2018-10-20 | Payer: Medicare Other | Source: Ambulatory Visit

## 2018-10-20 NOTE — Telephone Encounter (Signed)
Patient is aware of her new [place] appt on 5/13.

## 2018-10-20 NOTE — Telephone Encounter (Signed)
Please review. Ok to change the order?

## 2018-10-20 NOTE — Telephone Encounter (Signed)
Peggy with [facility] called to report patient had a STAT US scheduled for today that she was unaware of. Patient requested the [place] be scheduled for 5/13. The order expires on 5/11. They need a new order sent. Peggy has blocked [facility] [place] [facility]. She states who ever calls to schedule patient they will need to let them know that appointment has been held for her.

## 2018-10-23 ENCOUNTER — Other Ambulatory Visit: Payer: Self-pay | Admitting: Family Medicine

## 2018-10-23 DIAGNOSIS — M7989 Other specified soft tissue disorders: Secondary | ICD-10-CM

## 2018-10-23 NOTE — Telephone Encounter (Signed)
Peggy from centralized scheduling is calling again wanting to get an update of this new order. She says the patient is scheduled or an ultrasound on 10/25/2018. The order that was placed by Dr. Sherrie Mustache expired on today 10/23/2018. Peggy needs a new order re-entered and placed asap. Please place new order today. Call back is [phone]

## 2018-10-23 NOTE — Telephone Encounter (Signed)
Reordered venous ultrasound both lower legs. Call [facility] to remind them this order is for the appointment already blocked for this patient on 10-25-18.

## 2018-10-25 ENCOUNTER — Ambulatory Visit
Admission: RE | Admit: 2018-10-25 | Discharge: 2018-10-25 | Disposition: A | Discharge status: Home / Self Care | Payer: Medicare Other | Source: Ambulatory Visit | Attending: Family Medicine | Admitting: Family Medicine

## 2018-10-25 ENCOUNTER — Other Ambulatory Visit: Payer: Self-pay

## 2018-10-25 DIAGNOSIS — M79605 Pain in left leg: Secondary | ICD-10-CM | POA: Diagnosis not present

## 2018-10-25 DIAGNOSIS — M7989 Other specified soft tissue disorders: Secondary | ICD-10-CM | POA: Diagnosis not present

## 2018-10-25 DIAGNOSIS — M79604 Pain in right leg: Secondary | ICD-10-CM | POA: Diagnosis not present

## 2018-10-26 ENCOUNTER — Telehealth: Payer: Self-pay

## 2018-10-26 NOTE — Telephone Encounter (Signed)
Sounds like she might have sprained or broken her ankle. Her ultrasound came back negative for DVT so that is reassuring. It sounds like this is a new issue from her other leg swelling. I think she should get it evaluated but it is most likely not a DVT if it happened right after her fall.

## 2018-10-26 NOTE — Telephone Encounter (Signed)
I advised patient as below but she states this morning she fell out of bed and her right side but states that she twisted her body on her left side and swelling of the left ankle and pain is worse. Patient states that skin is discolored around ankle and is now purple. I urged patient to seek medical treatment today but she states that she cannot because she depends on public transportation and states that she has to give them a 3 day notice. I even offered patient appt tomorrow to see Dr. Sherrie Mustache if she can find another mode of transportation but patient states that she cannot. Please advise if patient should seek immediate medical attention. KW

## 2018-10-26 NOTE — Telephone Encounter (Signed)
Spoke and advised with patient who stated that she would need to schedule transportation and at this current moment states that she is able to walk on ankle but just in pain. Appt has been scheduled on the 18th with Dr. Sherrie Mustache. KW

## 2018-10-26 NOTE — Telephone Encounter (Signed)
-----   Message from Tamsen Roers, [place] sent at 10/26/2018  9:19 AM EDT ----- No sign of clots on ultrasound study. Follow up with Dr. Sherrie Mustache if continuing to have swelling or pain.

## 2018-10-29 ENCOUNTER — Inpatient Hospital Stay
Admission: EM | Admit: 2018-10-29 | Discharge: 2018-11-03 | DRG: 074 | Disposition: A | Discharge status: Home / Self Care | Payer: Medicare Other | Attending: Internal Medicine | Admitting: Internal Medicine

## 2018-10-29 ENCOUNTER — Emergency Department: Payer: Medicare Other

## 2018-10-29 ENCOUNTER — Encounter: Payer: Self-pay | Admitting: *Deleted

## 2018-10-29 ENCOUNTER — Other Ambulatory Visit: Payer: Self-pay

## 2018-10-29 DIAGNOSIS — Z7951 Long term (current) use of inhaled steroids: Secondary | ICD-10-CM

## 2018-10-29 DIAGNOSIS — E876 Hypokalemia: Secondary | ICD-10-CM | POA: Diagnosis not present

## 2018-10-29 DIAGNOSIS — Z818 Family history of other mental and behavioral disorders: Secondary | ICD-10-CM

## 2018-10-29 DIAGNOSIS — Z823 Family history of stroke: Secondary | ICD-10-CM

## 2018-10-29 DIAGNOSIS — F419 Anxiety disorder, unspecified: Secondary | ICD-10-CM | POA: Diagnosis present

## 2018-10-29 DIAGNOSIS — E119 Type 2 diabetes mellitus without complications: Secondary | ICD-10-CM | POA: Diagnosis not present

## 2018-10-29 DIAGNOSIS — I959 Hypotension, unspecified: Secondary | ICD-10-CM | POA: Diagnosis not present

## 2018-10-29 DIAGNOSIS — Z885 Allergy status to narcotic agent status: Secondary | ICD-10-CM

## 2018-10-29 DIAGNOSIS — E871 Hypo-osmolality and hyponatremia: Secondary | ICD-10-CM | POA: Diagnosis not present

## 2018-10-29 DIAGNOSIS — Z8261 Family history of arthritis: Secondary | ICD-10-CM

## 2018-10-29 DIAGNOSIS — Z79891 Long term (current) use of opiate analgesic: Secondary | ICD-10-CM | POA: Diagnosis not present

## 2018-10-29 DIAGNOSIS — I160 Hypertensive urgency: Secondary | ICD-10-CM | POA: Diagnosis present

## 2018-10-29 DIAGNOSIS — K30 Functional dyspepsia: Secondary | ICD-10-CM | POA: Diagnosis not present

## 2018-10-29 DIAGNOSIS — E785 Hyperlipidemia, unspecified: Secondary | ICD-10-CM | POA: Diagnosis not present

## 2018-10-29 DIAGNOSIS — I10 Essential (primary) hypertension: Secondary | ICD-10-CM | POA: Diagnosis present

## 2018-10-29 DIAGNOSIS — Z8349 Family history of other endocrine, nutritional and metabolic diseases: Secondary | ICD-10-CM

## 2018-10-29 DIAGNOSIS — D696 Thrombocytopenia, unspecified: Secondary | ICD-10-CM | POA: Diagnosis present

## 2018-10-29 DIAGNOSIS — Z1159 Encounter for screening for other viral diseases: Secondary | ICD-10-CM

## 2018-10-29 DIAGNOSIS — K219 Gastro-esophageal reflux disease without esophagitis: Secondary | ICD-10-CM | POA: Diagnosis present

## 2018-10-29 DIAGNOSIS — Z9049 Acquired absence of other specified parts of digestive tract: Secondary | ICD-10-CM | POA: Diagnosis not present

## 2018-10-29 DIAGNOSIS — Z8249 Family history of ischemic heart disease and other diseases of the circulatory system: Secondary | ICD-10-CM

## 2018-10-29 DIAGNOSIS — Z882 Allergy status to sulfonamides status: Secondary | ICD-10-CM

## 2018-10-29 DIAGNOSIS — E1143 Type 2 diabetes mellitus with diabetic autonomic (poly)neuropathy: Secondary | ICD-10-CM | POA: Diagnosis not present

## 2018-10-29 DIAGNOSIS — N179 Acute kidney failure, unspecified: Secondary | ICD-10-CM | POA: Diagnosis present

## 2018-10-29 DIAGNOSIS — R109 Unspecified abdominal pain: Secondary | ICD-10-CM | POA: Diagnosis not present

## 2018-10-29 DIAGNOSIS — Z90710 Acquired absence of both cervix and uterus: Secondary | ICD-10-CM | POA: Diagnosis not present

## 2018-10-29 DIAGNOSIS — Z79899 Other long term (current) drug therapy: Secondary | ICD-10-CM | POA: Diagnosis not present

## 2018-10-29 DIAGNOSIS — Z886 Allergy status to analgesic agent status: Secondary | ICD-10-CM

## 2018-10-29 DIAGNOSIS — R112 Nausea with vomiting, unspecified: Secondary | ICD-10-CM | POA: Diagnosis present

## 2018-10-29 DIAGNOSIS — F3181 Bipolar II disorder: Secondary | ICD-10-CM | POA: Diagnosis present

## 2018-10-29 DIAGNOSIS — Z87891 Personal history of nicotine dependence: Secondary | ICD-10-CM

## 2018-10-29 DIAGNOSIS — Z833 Family history of diabetes mellitus: Secondary | ICD-10-CM

## 2018-10-29 DIAGNOSIS — R1110 Vomiting, unspecified: Secondary | ICD-10-CM | POA: Diagnosis not present

## 2018-10-29 DIAGNOSIS — R000 Tachycardia, unspecified: Secondary | ICD-10-CM | POA: Diagnosis not present

## 2018-10-29 DIAGNOSIS — Z7984 Long term (current) use of oral hypoglycemic drugs: Secondary | ICD-10-CM | POA: Diagnosis not present

## 2018-10-29 DIAGNOSIS — G8929 Other chronic pain: Secondary | ICD-10-CM | POA: Diagnosis not present

## 2018-10-29 DIAGNOSIS — K3184 Gastroparesis: Secondary | ICD-10-CM | POA: Diagnosis not present

## 2018-10-29 DIAGNOSIS — R609 Edema, unspecified: Secondary | ICD-10-CM | POA: Diagnosis not present

## 2018-10-29 DIAGNOSIS — Z888 Allergy status to other drugs, medicaments and biological substances status: Secondary | ICD-10-CM

## 2018-10-29 DIAGNOSIS — R0902 Hypoxemia: Secondary | ICD-10-CM | POA: Diagnosis not present

## 2018-10-29 LAB — CBC WITH DIFFERENTIAL/PLATELET
Abs Immature Granulocytes: 0.13 10*3/uL — ABNORMAL HIGH (ref 0.00–0.07)
Basophils Absolute: 0 10*3/uL (ref 0.0–0.1)
Basophils Relative: 0 %
Eosinophils Absolute: 0 10*3/uL (ref 0.0–0.5)
Eosinophils Relative: 0 %
HCT: 39.5 % (ref 36.0–46.0)
Hemoglobin: 12.8 g/dL (ref 12.0–15.0)
Immature Granulocytes: 1 %
Lymphocytes Relative: 21 %
Lymphs Abs: 2.4 10*3/uL (ref 0.7–4.0)
MCH: 28.6 pg (ref 26.0–34.0)
MCHC: 32.4 g/dL (ref 30.0–36.0)
MCV: 88.4 fL (ref 80.0–100.0)
Monocytes Absolute: 0.2 10*3/uL (ref 0.1–1.0)
Monocytes Relative: 2 %
Neutro Abs: 8.9 10*3/uL — ABNORMAL HIGH (ref 1.7–7.7)
Neutrophils Relative %: 76 %
Platelets: 343 10*3/uL (ref 150–400)
RBC: 4.47 MIL/uL (ref 3.87–5.11)
RDW: 13.3 % (ref 11.5–15.5)
WBC: 11.7 10*3/uL — ABNORMAL HIGH (ref 4.0–10.5)
nRBC: 0.2 % (ref 0.0–0.2)

## 2018-10-29 LAB — COMPREHENSIVE METABOLIC PANEL
ALT: 14 U/L (ref 0–44)
AST: 18 U/L (ref 15–41)
Albumin: 4.4 g/dL (ref 3.5–5.0)
Alkaline Phosphatase: 59 U/L (ref 38–126)
Anion gap: 12 (ref 5–15)
BUN: 7 mg/dL (ref 6–20)
CO2: 25 mmol/L (ref 22–32)
Calcium: 9.3 mg/dL (ref 8.9–10.3)
Chloride: 102 mmol/L (ref 98–111)
Creatinine, Ser: 0.78 mg/dL (ref 0.44–1.00)
GFR calc Af Amer: >60 mL/min (ref >60)
GFR calc non Af Amer: >60 mL/min (ref >60)
Glucose, Bld: 156 mg/dL — ABNORMAL HIGH (ref 70–99)
Potassium: 3.3 mmol/L — ABNORMAL LOW (ref 3.5–5.1)
Sodium: 139 mmol/L (ref 135–145)
Total Bilirubin: 0.6 mg/dL (ref 0.3–1.2)
Total Protein: 8.4 g/dL — ABNORMAL HIGH (ref 6.5–8.1)

## 2018-10-29 LAB — URINALYSIS, COMPLETE (UACMP) WITH MICROSCOPIC
Bacteria, UA: NONE SEEN
Bilirubin Urine: NEGATIVE
Glucose, UA: NEGATIVE mg/dL
Ketones, ur: NEGATIVE mg/dL
Leukocytes,Ua: NEGATIVE
Nitrite: NEGATIVE
Protein, ur: 30 mg/dL — AB
Specific Gravity, Urine: 1.01 (ref 1.005–1.030)
pH: 7 (ref 5.0–8.0)

## 2018-10-29 LAB — SARS CORONAVIRUS 2 BY RT PCR (HOSPITAL ORDER, PERFORMED IN ~~LOC~~ HOSPITAL LAB): SARS Coronavirus 2: NEGATIVE

## 2018-10-29 LAB — TROPONIN I: Troponin I: <0.03 ng/mL (ref <0.03)

## 2018-10-29 LAB — LIPASE, BLOOD: Lipase: 22 U/L (ref 11–51)

## 2018-10-29 MED ORDER — HYDRALAZINE HCL 20 MG/ML IJ SOLN
10.0000 mg | Freq: Once | INTRAMUSCULAR | Status: AC
  Administered 2018-10-29: 23:00:00 10 mg via INTRAVENOUS
  Filled 2018-10-29: qty 1

## 2018-10-29 MED ORDER — IOHEXOL 300 MG/ML  SOLN
125.0000 mL | Freq: Once | INTRAMUSCULAR | Status: AC | PRN
  Administered 2018-10-29: 23:00:00 150 mL via INTRAVENOUS

## 2018-10-29 MED ORDER — FENTANYL CITRATE (PF) 100 MCG/2ML IJ SOLN
50.0000 ug | Freq: Once | INTRAMUSCULAR | Status: AC
  Administered 2018-10-29: 20:00:00 50 ug via INTRAVENOUS
  Filled 2018-10-29: qty 2

## 2018-10-29 MED ORDER — AMLODIPINE BESYLATE 5 MG PO TABS: 5.0000 mg | ORAL_TABLET | Freq: Once | ORAL | Status: DC

## 2018-10-29 MED ORDER — METOCLOPRAMIDE HCL 5 MG/ML IJ SOLN
10.0000 mg | Freq: Once | INTRAMUSCULAR | Status: AC
  Administered 2018-10-29: 20:00:00 10 mg via INTRAVENOUS
  Filled 2018-10-29: qty 2

## 2018-10-29 MED ORDER — HALOPERIDOL LACTATE 5 MG/ML IJ SOLN
2.0000 mg | Freq: Once | INTRAMUSCULAR | Status: AC
  Administered 2018-10-29: 23:00:00 2 mg via INTRAVENOUS
  Filled 2018-10-29: qty 1

## 2018-10-29 MED ORDER — METOPROLOL SUCCINATE ER 50 MG PO TB24: 50.0000 mg | ORAL_TABLET | Freq: Every day | ORAL | Status: DC

## 2018-10-29 MED ORDER — SODIUM CHLORIDE 0.9 % IV BOLUS
1000.0000 mL | Freq: Once | INTRAVENOUS | Status: AC
  Administered 2018-10-29: 19:00:00 1000 mL via INTRAVENOUS

## 2018-10-29 MED ORDER — ONDANSETRON HCL 4 MG/2ML IJ SOLN
4.0000 mg | Freq: Once | INTRAMUSCULAR | Status: AC
  Administered 2018-10-29: 4 mg via INTRAVENOUS
  Filled 2018-10-29: qty 2

## 2018-10-29 MED ORDER — FENTANYL CITRATE (PF) 100 MCG/2ML IJ SOLN: 50.0000 ug | Freq: Once | INTRAMUSCULAR | Status: DC

## 2018-10-29 MED ORDER — LISINOPRIL 10 MG PO TABS: 30.0000 mg | ORAL_TABLET | Freq: Once | ORAL | Status: DC

## 2018-10-29 MED ORDER — HYDROMORPHONE HCL 1 MG/ML IJ SOLN
1.0000 mg | Freq: Once | INTRAMUSCULAR | Status: AC
  Administered 2018-10-29: 19:00:00 1 mg via INTRAVENOUS
  Filled 2018-10-29: qty 1

## 2018-10-29 MED ORDER — DIAZEPAM 5 MG/ML IJ SOLN
5.0000 mg | Freq: Once | INTRAMUSCULAR | Status: AC
  Administered 2018-10-30: 02:00:00 5 mg via INTRAVENOUS
  Filled 2018-10-29: qty 2

## 2018-10-29 NOTE — ED Triage Notes (Signed)
Pt to ED reporting hx of gastroparesis and a similar pain x 3 days. Increased pain x 3 days with nausea and vomiting. Pt has not been able to keep her 4 HTN medications down in three days as well and presents to the ED hypertensive at 215/119.   Pt reporting she fell from bed last week and caught her left ankle. Pain reported and swelling noted. Color is appropriate and pedal pulse is intact.

## 2018-10-29 NOTE — H&P (Signed)
[facility] - [place] at [facility]   PATIENT NAME: Daisy [name]    MR#:  [id number]  DATE OF BIRTH:  [birth year]  DATE OF ADMISSION:  10/29/2018  PRIMARY CARE PHYSICIAN: Malva Limes, MD   REQUESTING/REFERRING PHYSICIAN: Don Perking, MD  CHIEF COMPLAINT:   Chief Complaint  Patient presents with  . Abdominal Pain    HISTORY OF PRESENT ILLNESS:  Daisy [name]  is a 53 y.o. female who presents with chief complaint as above.  Patient presents the ED with intractable nausea vomiting.  She states that this started this morning with abdominal pain and nausea and vomiting.  She states this feels like gastroparesis, for she has been admitted in the past.  Multiple doses of different antiemetics in the ED have not controlled her nausea and vomiting.  CT abdomen pelvis did not reveal any acute finding that would explain her symptoms.  Hospitalist called for admission  PAST MEDICAL HISTORY:   Past Medical History:  Diagnosis Date  . Diabetes mellitus without complication (HCC)   . Gastritis   . Gastroparesis   . Hernia of abdominal wall   . Hypertension   . Migraine      PAST SURGICAL HISTORY:   Past Surgical History:  Procedure Laterality Date  . ABDOMINAL HYSTERECTOMY    . APPENDECTOMY    . APPLICATION OF WOUND VAC    . CHOLECYSTECTOMY    . HERNIA REPAIR    . KNEE SURGERY       SOCIAL HISTORY:   Social History   Tobacco Use  . Smoking status: Never Smoker  . Smokeless tobacco: Former Neurosurgeon    Types: Snuff  . Tobacco comment: quit > 30 years ago  Substance Use Topics  . Alcohol use: Yes    Alcohol/week: 1.0 standard drinks    Types: 1 Standard drinks or equivalent per week    Comment: only on special occasions- wine     FAMILY HISTORY:   Family History  Problem Relation Age of Onset  . Arthritis Mother   . Depression Mother   . Diabetes Mother   . Heart disease Mother   . Hyperlipidemia Mother   . Hypertension Mother   . Stroke  Mother   . Arthritis Father   . Diabetes Father   . Hearing loss Father   . Heart disease Father   . Hyperlipidemia Father   . Hypertension Father      DRUG ALLERGIES:   Allergies  Allergen Reactions  . Sulfasalazine Hives  . Acetaminophen-Codeine Hives  . Atorvastatin Other (See Comments)    Muscle pain  . Codeine Hives  . Ibuprofen Nausea And Vomiting  . Propoxyphene Other (See Comments)    Reaction:  GI upset   . Sulfa Antibiotics Hives  . Duloxetine Rash and Other (See Comments)    Yellowing of the skin    MEDICATIONS AT HOME:   Prior to Admission medications   Medication Sig Start Date End Date Taking? Authorizing Provider  albuterol (PROVENTIL HFA;VENTOLIN HFA) 108 (90 Base) MCG/ACT inhaler Inhale 2 puffs into the lungs every 6 (six) hours as needed for wheezing or shortness of breath. 09/01/18  Yes Fisher, Demetrios Isaacs, MD  alprazolam Prudy Feeler) 2 MG tablet Take 2 mg by mouth 3 (three) times daily.    Yes [provider]  amLODipine (NORVASC) 5 MG tablet Take 1 tablet (5 mg total) by mouth daily. 03/24/18  Yes Malva Limes, MD  cetirizine (ZYRTEC) 10 MG tablet Take  1 tablet (10 mg total) by mouth daily. 04/25/18  Yes Malva Limes, MD  cloNIDine (CATAPRES - DOSED IN MG/24 HR) 0.1 mg/24hr patch Place 1 patch (0.1 mg total) onto the skin once a week. 08/17/18  Yes Malva Limes, MD  famotidine (PEPCID) 20 MG tablet Take 1 tablet (20 mg total) by mouth daily. 06/23/18  Yes Malva Limes, MD  FLUoxetine (PROZAC) 20 MG capsule Take 20 mg by mouth daily.    Yes [provider]  fluticasone (FLONASE) 50 MCG/ACT nasal spray Place 2 sprays into both nostrils daily. 12/29/17  Yes Malva Limes, MD  furosemide (LASIX) 20 MG tablet Take 1 tablet (20 mg total) by mouth daily. 03/24/18  Yes Malva Limes, MD  lamoTRIgine (LAMICTAL) 100 MG tablet Take 100 mg by mouth daily.    Yes [provider]  lisinopril (PRINIVIL,ZESTRIL) 20 MG tablet Take 1.5  tablets by mouth every day Patient taking differently: Take 30 mg by mouth daily.  08/02/18  Yes Malva Limes, MD  metFORMIN (GLUCOPHAGE) 500 MG tablet Take 1 tablet by mouth twice daily with meals Patient taking differently: Take 500 mg by mouth 2 (two) times daily with a meal.  05/12/18  Yes Malva Limes, MD  metoCLOPramide (REGLAN) 10 MG tablet Take 1 tablet (10 mg total) by mouth 3 (three) times daily with meals. 11/30/17 11/30/18 Yes Auburn Bilberry, MD  metoprolol succinate (TOPROL-XL) 25 MG 24 hr tablet Take 2 tablets by mouth every day Patient taking differently: Take 50 mg by mouth daily.  07/05/18  Yes Malva Limes, MD  montelukast (SINGULAIR) 10 MG tablet Take 1 tablet (10 mg total) by mouth at bedtime. 09/09/18  Yes Malva Limes, MD  OVER THE COUNTER MEDICATION Take 1 each by mouth daily. Nutraburst supplement- contains Vitamins B, C, and D   Yes [provider]  Oxycodone HCl 10 MG TABS Take 1 tablet (10 mg total) by mouth every 6 (six) hours as needed (pain). Patient taking differently: Take 10 mg by mouth as needed (pain). Can take 4-7 tablets per day 11/30/17  Yes Auburn Bilberry, MD  pantoprazole (PROTONIX) 40 MG tablet Take 1 tablet by mouth twice daily Patient taking differently: Take 40 mg by mouth 2 (two) times daily.  10/06/18  Yes Malva Limes, MD  potassium chloride SA (K-DUR,KLOR-CON) 20 MEQ tablet Take 1 tablet by mouth every day with furosemide Patient taking differently: Take 20 mEq by mouth daily. With furosemide 05/12/18  Yes Malva Limes, MD  promethazine (PHENERGAN) 12.5 MG tablet Take 1 tablet by mouth every 6 hours as needed for refractory nausea/vomiting Patient taking differently: Take 12.5 mg by mouth every 6 (six) hours as needed for refractory nausea / vomiting.  07/27/18  Yes Malva Limes, MD  rosuvastatin (CRESTOR) 10 MG tablet TAKE 1 TABLET BY MOUTH EVERY DAY Patient taking differently: Take 10 mg by mouth daily.  01/20/18  Yes  Malva Limes, MD  tiZANidine (ZANAFLEX) 4 MG tablet Take 4 mg by mouth 3 (three) times daily.    Yes [provider]  traZODone (DESYREL) 100 MG tablet Take 200 mg by mouth at bedtime.  10/12/16  Yes [provider]  zolpidem (AMBIEN) 10 MG tablet Take 10 mg by mouth at bedtime.  03/23/17  Yes [provider]  protein supplement shake (PREMIER PROTEIN) LIQD Take 325 mLs (11 oz total) by mouth 3 (three) times daily between meals. 03/18/17   Allena Katz,  Sona, MD    REVIEW OF SYSTEMS:  Review of Systems  Constitutional: Negative for chills, fever, malaise/fatigue and weight loss.  HENT: Negative for ear pain, hearing loss and tinnitus.   Eyes: Negative for blurred vision, double vision, pain and redness.  Respiratory: Negative for cough, hemoptysis and shortness of breath.   Cardiovascular: Negative for chest pain, palpitations, orthopnea and leg swelling.  Gastrointestinal: Positive for abdominal pain, nausea and vomiting. Negative for constipation and diarrhea.  Genitourinary: Negative for dysuria, frequency and hematuria.  Musculoskeletal: Negative for back pain, joint pain and neck pain.  Skin:       No acne, rash, or lesions  Neurological: Negative for dizziness, tremors, focal weakness and weakness.  Endo/Heme/Allergies: Negative for polydipsia. Does not bruise/bleed easily.  Psychiatric/Behavioral: Negative for depression. The patient is not nervous/anxious and does not have insomnia.      VITAL SIGNS:   Vitals:   10/29/18 1757 10/29/18 1758 10/29/18 2026 10/29/18 2246  BP: (!) 215/119  (!) 190/109 (!) 209/123  Pulse: 80  90 92  Resp: (!) 24  (!) 24 (!) 22  Temp: 98.4 F (36.9 C)     TempSrc: Oral     SpO2: 100%  98% 98%  Weight:  128.4 kg    Height:  5\' 5"  (1.651 m)     Wt Readings from Last 3 Encounters:  10/29/18 128.4 kg  10/16/18 128.4 kg  09/01/18 128 kg    PHYSICAL EXAMINATION:  Physical Exam  Vitals reviewed. Constitutional: She is  oriented to person, place, and time. She appears well-developed and well-nourished. No distress.  HENT:  Head: Normocephalic and atraumatic.  Mouth/Throat: Oropharynx is clear and moist.  Eyes: Pupils are equal, round, and reactive to light. Conjunctivae and EOM are normal. No scleral icterus.  Neck: Normal range of motion. Neck supple. No JVD present. No thyromegaly present.  Cardiovascular: Normal rate, regular rhythm and intact distal pulses. Exam reveals no gallop and no friction rub.  No murmur heard. Respiratory: Effort normal and breath sounds normal. No respiratory distress. She has no wheezes. She has no rales.  GI: Soft. Bowel sounds are normal. She exhibits no distension. There is abdominal tenderness.  Musculoskeletal: Normal range of motion.        General: No edema.     Comments: No arthritis, no gout  Lymphadenopathy:    She has no cervical adenopathy.  Neurological: She is alert and oriented to person, place, and time. No cranial nerve deficit.  No dysarthria, no aphasia  Skin: Skin is warm and dry. No rash noted. No erythema.  Psychiatric: She has a normal mood and affect. Her behavior is normal. Judgment and thought content normal.    LABORATORY PANEL:   CBC Recent Labs  Lab 10/29/18 1849  WBC 11.7*  HGB 12.8  HCT 39.5  PLT 343   ------------------------------------------------------------------------------------------------------------------  Chemistries  Recent Labs  Lab 10/29/18 1849  NA 139  K 3.3*  CL 102  CO2 25  GLUCOSE 156*  BUN 7  CREATININE 0.78  CALCIUM 9.3  AST 18  ALT 14  ALKPHOS 59  BILITOT 0.6   ------------------------------------------------------------------------------------------------------------------  Cardiac Enzymes Recent Labs  Lab 10/29/18 1849  TROPONINI <0.03   ------------------------------------------------------------------------------------------------------------------  RADIOLOGY:  Ct Abdomen Pelvis W  Contrast  Result Date: 10/29/2018 CLINICAL DATA:  53 y/o F; increasing abdominal pain for 3 days with nausea and vomiting. History of gastroparesis with similar symptoms. EXAM: CT ABDOMEN AND PELVIS WITH CONTRAST TECHNIQUE: Multidetector CT imaging  of the abdomen and pelvis was performed using the standard protocol following bolus administration of intravenous contrast. CONTRAST:  OMNIPAQUE IOHEXOL 300 MG/ML  SOLN COMPARISON:  10/02/2017 CT abdomen pelvis. FINDINGS: Lower chest: Borderline cardiomegaly. Hepatobiliary: Mild hepatomegaly. No focal liver abnormality is seen. Status post cholecystectomy. No biliary dilatation. Pancreas: Unremarkable. No pancreatic ductal dilatation or surrounding inflammatory changes. Spleen: Normal in size without focal abnormality. Adrenals/Urinary Tract: Adrenal glands are unremarkable. Kidneys are normal, without renal calculi, focal lesion, or hydronephrosis. Bladder is unremarkable. Stomach/Bowel: Small hiatal hernia. Stomach is otherwise within normal limits. Appendectomy. No evidence of bowel wall thickening, distention, or inflammatory changes. Mild diverticulosis without findings of acute diverticulitis. Vascular/Lymphatic: No significant vascular findings are present. No enlarged abdominal or pelvic lymph nodes. Reproductive: Status post hysterectomy. Left adnexal round homogeneous well-circumscribed cysts measuring up to 3.8 cm. Other: Stable large broad-based hernia within the right anterolateral abdominal wall containing loops of small bowel without obstructive or inflammatory changes. Musculoskeletal: No fracture is seen. Lumbar spondylosis with prominent L4-5 and L5-S1 facet arthropathy. IMPRESSION: 1. New small hiatal hernia. 2. Stable mild hepatomegaly. 3. Stable mild diverticulosis without findings of acute diverticulitis. 4. Stable large broad-based right anterolateral abdominal wall hernia containing loops of small bowel without obstructive or inflammatory  changes. 5. Left adnexal benign-appearing cyst measuring up to 3.8 cm, increased from prior study. If the patient is early postmenopausal/postmenopausal, follow-up ultrasound is recommended on a nonemergent basis. Electronically Signed   By: Mitzi Hansen M.D.   On: 10/29/2018 23:20    EKG:   Orders placed or performed during the hospital encounter of 10/29/18  . EKG 12-Lead  . EKG 12-Lead    IMPRESSION AND PLAN:  Principal Problem:   Intractable nausea & vomiting -likely related to her gastroparesis, although her glucose is not severely elevated.  We will use PRN antiemetics, specifically including Reglan for promotility.  IV fluids for hydration.  Give 1 dose of IV Valium in the ED now. Active Problems:   Gastroparesis -see above for treatment   Accelerated hypertension -his IV antihypertensives for now to get her blood pressure down, continue home antihypertensives as well, blood pressure goal less than 160/100   DMII (diabetes mellitus, type 2) (HCC) -sliding scale insulin coverage   Hyperlipidemia -home dose antilipid   Anxiety -home dose anxiolytic   Bipolar 2 disorder (HCC) -continue home meds   GERD (gastroesophageal reflux disease) -home dose PPI  Chart review performed and case discussed with ED provider. Labs, imaging and/or ECG reviewed by provider and discussed with patient/family. Management plans discussed with the patient and/or family.  COVID-19 status: Tested negative     DVT PROPHYLAXIS: SubQ lovenox   GI PROPHYLAXIS:  PPI   ADMISSION STATUS: Inpatient      CODE STATUS: Full Code Status History    Date Active Date Inactive Code Status Order ID Comments User Context   04/16/2018 2108 04/17/2018 2106 Full Code [id number]  Milagros Loll, MD ED   01/14/2018 1207 01/15/2018 1410 Full Code [id number]  Barbaraann Rondo, MD ED   11/29/2017 0111 11/30/2017 1813 Full Code [id number]  Oralia Manis, MD Inpatient   10/02/2017 1226 10/03/2017 1959 Full Code [id number]   Shaune Pollack, MD Inpatient   03/16/2017 0454 03/18/2017 1637 Full Code [id number]  Ihor Austin, MD Inpatient   01/03/2017 0824 01/05/2017 1557 Full Code [id number]  Ihor Austin, MD Inpatient   03/23/2016 0629 03/26/2016 1651 Full Code [id number]  Arnaldo Natal, MD Inpatient   09/28/2015 [phone] 09/29/2015 1720  Full Code [id number]  Ihor Austin, MD ED   05/05/2015 2036 05/09/2015 2006 Full Code [id number]  Wyatt Haste, MD ED   01/07/2015 1911 01/09/2015 1855 Full Code [id number]  Milagros Loll, MD ED      TOTAL TIME TAKING CARE OF THIS PATIENT: 45 minutes.   This patient was evaluated in the context of the global COVID-19 pandemic, which necessitated consideration that the patient might be at risk for infection with the SARS-CoV-2 virus that causes COVID-19. Institutional protocols and algorithms that pertain to the evaluation of patients at risk for COVID-19 are in a state of rapid change based on information released by regulatory bodies including the CDC and federal and state organizations. These policies and algorithms were followed to the best of this provider's knowledge to date during the patient's care at this facility.  Barney Drain 10/29/2018, 11:52 PM  [facility]  Office  [phone]  CC: Primary care physician; Malva Limes, MD  Note:  This document was prepared using Dragon voice recognition software and may include unintentional dictation errors.

## 2018-10-29 NOTE — ED Provider Notes (Signed)
[facility] Emergency Department Provider Note  ____________________________________________  Time seen: Approximately 10:33 PM  I have reviewed the triage vital signs and the nursing notes.   HISTORY  Chief Complaint Abdominal Pain   HPI Daisy [name] is a 53 y.o. female with a history of gastroparesis, gastritis, diabetes, hypertension who presents for evaluation of nausea and vomiting.  Patient reports that her symptoms started this morning.  She had numerous episodes of nonbloody nonbilious emesis, severe constant nausea.  Diffuse cramping abdominal pain which has been moderate in intensity.  Has had 4 episodes of diarrhea.  No melena, coffee-ground emesis, hematemesis.  No fever or chills.  No dysuria or hematuria.  No chest pain, fever, shortness of breath.  Patient has been unable to keep her blood pressure medications down today and vomited all of them.  Past Medical History:  Diagnosis Date   Diabetes mellitus without complication (HCC)    Gastritis    Gastroparesis    Hernia of abdominal wall    Hypertension    Migraine     Patient Active Problem List   Diagnosis Date Noted   Seasonal allergic rhinitis due to pollen 09/01/2018   Nausea & vomiting 01/14/2018   Allergic rhinitis 04/18/2017   Gastroparesis 01/03/2017   Hyperlipidemia 06/18/2016   DMII (diabetes mellitus, type 2) (HCC) 06/18/2016   Bipolar 2 disorder (HCC) 06/18/2016   Depression 06/18/2016   GERD (gastroesophageal reflux disease) 06/18/2016   Hypertension 06/18/2016   Ingrown toenail 06/18/2016   Hypokalemia 09/28/2015   Chronic abdominal pain 05/23/2015   Complex ovarian cyst 05/23/2015   Anxiety 03/27/2015   Insomnia 03/27/2015   Gastritis 01/07/2015   DDD (degenerative disc disease), lumbar 11/28/2014   DJD (degenerative joint disease) of knee 11/28/2014   Migraine headache 11/28/2014   Bilateral occipital neuralgia 11/28/2014    Morbid obesity (HCC) 11/28/2014   Neuropathy due to secondary diabetes (HCC) 11/28/2014   Abdominal wall abscess 09/20/2014   Back pain, chronic 09/20/2014   Incisional hernia, without obstruction or gangrene 04/30/2014   Bilateral chronic knee pain 03/04/2014    Past Surgical History:  Procedure Laterality Date   ABDOMINAL HYSTERECTOMY     APPENDECTOMY     APPLICATION OF WOUND VAC     CHOLECYSTECTOMY     HERNIA REPAIR     KNEE SURGERY      Prior to Admission medications   Medication Sig Start Date End Date Taking? Authorizing Provider  albuterol (PROVENTIL HFA;VENTOLIN HFA) 108 (90 Base) MCG/ACT inhaler Inhale 2 puffs into the lungs every 6 (six) hours as needed for wheezing or shortness of breath. 09/01/18  Yes Fisher, Demetrios Isaacs, MD  alprazolam Prudy Feeler) 2 MG tablet Take 2 mg by mouth 3 (three) times daily.    Yes [provider]  amLODipine (NORVASC) 5 MG tablet Take 1 tablet (5 mg total) by mouth daily. 03/24/18  Yes Malva Limes, MD  cetirizine (ZYRTEC) 10 MG tablet Take 1 tablet (10 mg total) by mouth daily. 04/25/18  Yes Malva Limes, MD  cloNIDine (CATAPRES - DOSED IN MG/24 HR) 0.1 mg/24hr patch Place 1 patch (0.1 mg total) onto the skin once a week. 08/17/18  Yes Malva Limes, MD  famotidine (PEPCID) 20 MG tablet Take 1 tablet (20 mg total) by mouth daily. 06/23/18  Yes Malva Limes, MD  FLUoxetine (PROZAC) 20 MG capsule Take 20 mg by mouth daily.    Yes [provider]  fluticasone (FLONASE) 50 MCG/ACT  nasal spray Place 2 sprays into both nostrils daily. 12/29/17  Yes Malva Limes, MD  furosemide (LASIX) 20 MG tablet Take 1 tablet (20 mg total) by mouth daily. 03/24/18  Yes Malva Limes, MD  lamoTRIgine (LAMICTAL) 100 MG tablet Take 100 mg by mouth daily.    Yes [provider]  lisinopril (PRINIVIL,ZESTRIL) 20 MG tablet Take 1.5 tablets by mouth every day Patient taking differently: Take 30 mg by mouth daily.  08/02/18   Yes Malva Limes, MD  metFORMIN (GLUCOPHAGE) 500 MG tablet Take 1 tablet by mouth twice daily with meals Patient taking differently: Take 500 mg by mouth 2 (two) times daily with a meal.  05/12/18  Yes Malva Limes, MD  metoCLOPramide (REGLAN) 10 MG tablet Take 1 tablet (10 mg total) by mouth 3 (three) times daily with meals. 11/30/17 11/30/18 Yes Auburn Bilberry, MD  metoprolol succinate (TOPROL-XL) 25 MG 24 hr tablet Take 2 tablets by mouth every day Patient taking differently: Take 50 mg by mouth daily.  07/05/18  Yes Malva Limes, MD  montelukast (SINGULAIR) 10 MG tablet Take 1 tablet (10 mg total) by mouth at bedtime. 09/09/18  Yes Malva Limes, MD  OVER THE COUNTER MEDICATION Take 1 each by mouth daily. Nutraburst supplement- contains Vitamins B, C, and D   Yes [provider]  Oxycodone HCl 10 MG TABS Take 1 tablet (10 mg total) by mouth every 6 (six) hours as needed (pain). Patient taking differently: Take 10 mg by mouth as needed (pain). Can take 4-7 tablets per day 11/30/17  Yes Auburn Bilberry, MD  pantoprazole (PROTONIX) 40 MG tablet Take 1 tablet by mouth twice daily Patient taking differently: Take 40 mg by mouth 2 (two) times daily.  10/06/18  Yes Malva Limes, MD  potassium chloride SA (K-DUR,KLOR-CON) 20 MEQ tablet Take 1 tablet by mouth every day with furosemide Patient taking differently: Take 20 mEq by mouth daily. With furosemide 05/12/18  Yes Malva Limes, MD  promethazine (PHENERGAN) 12.5 MG tablet Take 1 tablet by mouth every 6 hours as needed for refractory nausea/vomiting Patient taking differently: Take 12.5 mg by mouth every 6 (six) hours as needed for refractory nausea / vomiting.  07/27/18  Yes Malva Limes, MD  rosuvastatin (CRESTOR) 10 MG tablet TAKE 1 TABLET BY MOUTH EVERY DAY Patient taking differently: Take 10 mg by mouth daily.  01/20/18  Yes Malva Limes, MD  tiZANidine (ZANAFLEX) 4 MG tablet Take 4 mg by mouth 3 (three) times  daily.    Yes [provider]  traZODone (DESYREL) 100 MG tablet Take 200 mg by mouth at bedtime.  10/12/16  Yes [provider]  zolpidem (AMBIEN) 10 MG tablet Take 10 mg by mouth at bedtime.  03/23/17  Yes [provider]  protein supplement shake (PREMIER PROTEIN) LIQD Take 325 mLs (11 oz total) by mouth 3 (three) times daily between meals. 03/18/17   Enedina Finner, MD    Allergies Sulfasalazine; Acetaminophen-codeine; Atorvastatin; Codeine; Ibuprofen; Propoxyphene; Sulfa antibiotics; and Duloxetine  Family History  Problem Relation Age of Onset   Arthritis Mother    Depression Mother    Diabetes Mother    Heart disease Mother    Hyperlipidemia Mother    Hypertension Mother    Stroke Mother    Arthritis Father    Diabetes Father    Hearing loss Father    Heart disease Father    Hyperlipidemia Father    Hypertension  Father     Social History Social History   Tobacco Use   Smoking status: Never Smoker   Smokeless tobacco: Former NeurosurgeonUser    Types: Snuff   Tobacco comment: quit > 30 years ago  Substance Use Topics   Alcohol use: Yes    Alcohol/week: 1.0 standard drinks    Types: 1 Standard drinks or equivalent per week    Comment: only on special occasions- wine   Drug use: No    Review of Systems  Constitutional: Negative for fever. Eyes: Negative for visual changes. ENT: Negative for sore throat. Neck: No neck pain  Cardiovascular: Negative for chest pain. Respiratory: Negative for shortness of breath. Gastrointestinal: + abdominal pain, vomiting and diarrhea. Genitourinary: Negative for dysuria. Musculoskeletal: Negative for back pain. Skin: Negative for rash. Neurological: Negative for headaches, weakness or numbness. Psych: No SI or HI  ____________________________________________   PHYSICAL EXAM:  VITAL SIGNS: ED Triage Vitals  Enc Vitals Group     BP 10/29/18 1757 (!) 215/119     Pulse Rate 10/29/18 1757 80       Resp 10/29/18 1757 (!) 24     Temp 10/29/18 1757 98.4 F (36.9 C)     Temp Source 10/29/18 1757 Oral     SpO2 10/29/18 1757 100 %     Weight 10/29/18 1758 283 lb (128.4 kg)     Height 10/29/18 1758 5\' 5"  (1.651 m)     Head Circumference --      Peak Flow --      Pain Score 10/29/18 1758 10     Pain Loc --      Pain Edu? --      Excl. in GC? --     Constitutional: Alert and oriented. Well appearing and in no apparent distress. HEENT:      Head: Normocephalic and atraumatic.         Eyes: Conjunctivae are normal. Sclera is non-icteric.       Mouth/Throat: Mucous membranes are moist.       Neck: Supple with no signs of meningismus. Cardiovascular: Regular rate and rhythm. No murmurs, gallops, or rubs. 2+ symmetrical distal pulses are present in all extremities. No JVD. Respiratory: Normal respiratory effort. Lungs are clear to auscultation bilaterally. No wheezes, crackles, or rhonchi.  Gastrointestinal: Obese mild diffuse tenderness to palpation, and non distended with positive bowel sounds. No rebound or guarding. Genitourinary: No CVA tenderness. Musculoskeletal: Nontender with normal range of motion in all extremities. No edema, cyanosis, or erythema of extremities. Neurologic: Normal speech and language. Face is symmetric. Moving all extremities. No gross focal neurologic deficits are appreciated. Skin: Skin is warm, dry and intact. No rash noted. Psychiatric: Mood and affect are normal. Speech and behavior are normal.  ____________________________________________   LABS (all labs ordered are listed, but only abnormal results are displayed)  Labs Reviewed  CBC WITH DIFFERENTIAL/PLATELET - Abnormal; Notable for the following components:      Result Value   WBC 11.7 (*)    Neutro Abs 8.9 (*)    Abs Immature Granulocytes 0.13 (*)    All other components within normal limits  COMPREHENSIVE METABOLIC PANEL - Abnormal; Notable for the following components:   Potassium 3.3  (*)    Glucose, Bld 156 (*)    Total Protein 8.4 (*)    All other components within normal limits  URINALYSIS, COMPLETE (UACMP) WITH MICROSCOPIC - Abnormal; Notable for the following components:   Color, Urine STRAW (*)  APPearance HAZY (*)    Hgb urine dipstick SMALL (*)    Protein, ur 30 (*)    All other components within normal limits  SARS CORONAVIRUS 2 (HOSPITAL ORDER, PERFORMED IN [facility])  LIPASE, BLOOD  TROPONIN I   ____________________________________________  EKG  ED ECG REPORT I, Nita Sickle, the attending physician, personally viewed and interpreted this ECG.  Normal sinus rhythm, rate of 77, normal intervals, normal axis, no ST elevations or depressions.  Normal EKG. ____________________________________________  RADIOLOGY  I have personally reviewed the images performed during this visit and I agree with the Radiologist's read.   Interpretation by Radiologist:  Ct Abdomen Pelvis W Contrast  Result Date: 10/29/2018 CLINICAL DATA:  53 y/o F; increasing abdominal pain for 3 days with nausea and vomiting. History of gastroparesis with similar symptoms. EXAM: CT ABDOMEN AND PELVIS WITH CONTRAST TECHNIQUE: Multidetector CT imaging of the abdomen and pelvis was performed using the standard protocol following bolus administration of intravenous contrast. CONTRAST:  OMNIPAQUE IOHEXOL 300 MG/ML  SOLN COMPARISON:  10/02/2017 CT abdomen pelvis. FINDINGS: Lower chest: Borderline cardiomegaly. Hepatobiliary: Mild hepatomegaly. No focal liver abnormality is seen. Status post cholecystectomy. No biliary dilatation. Pancreas: Unremarkable. No pancreatic ductal dilatation or surrounding inflammatory changes. Spleen: Normal in size without focal abnormality. Adrenals/Urinary Tract: Adrenal glands are unremarkable. Kidneys are normal, without renal calculi, focal lesion, or hydronephrosis. Bladder is unremarkable. Stomach/Bowel: Small hiatal hernia. Stomach is  otherwise within normal limits. Appendectomy. No evidence of bowel wall thickening, distention, or inflammatory changes. Mild diverticulosis without findings of acute diverticulitis. Vascular/Lymphatic: No significant vascular findings are present. No enlarged abdominal or pelvic lymph nodes. Reproductive: Status post hysterectomy. Left adnexal round homogeneous well-circumscribed cysts measuring up to 3.8 cm. Other: Stable large broad-based hernia within the right anterolateral abdominal wall containing loops of small bowel without obstructive or inflammatory changes. Musculoskeletal: No fracture is seen. Lumbar spondylosis with prominent L4-5 and L5-S1 facet arthropathy. IMPRESSION: 1. New small hiatal hernia. 2. Stable mild hepatomegaly. 3. Stable mild diverticulosis without findings of acute diverticulitis. 4. Stable large broad-based right anterolateral abdominal wall hernia containing loops of small bowel without obstructive or inflammatory changes. 5. Left adnexal benign-appearing cyst measuring up to 3.8 cm, increased from prior study. If the patient is early postmenopausal/postmenopausal, follow-up ultrasound is recommended on a nonemergent basis. Electronically Signed   By: Mitzi Hansen M.D.   On: 10/29/2018 23:20      ____________________________________________   PROCEDURES  Procedure(s) performed: None Procedures Critical Care performed:  None ____________________________________________   INITIAL IMPRESSION / ASSESSMENT AND PLAN / ED COURSE  53 y.o. female with a history of gastroparesis, gastritis, diabetes, hypertension who presents for evaluation of nausea and vomiting and diarrhea.  Patient has had several prior abdominal surgeries including cholecystectomy, appendectomy, hysterectomy.  She has diffuse mild tenderness with no localized tenderness, rebound or guarding.  Patient thinks her presentation is consistent with gastroparesis however the diarrhea is different.   Possibly colitis versus diverticulitis versus gastroenteritis versus gastroparesis.  Patient received fluids, Zofran, Reglan, Dilaudid and fentanyl and continues to complain of pain and continues to have intractable nausea and vomiting.  CT scan showed no acute abnormalities. Patient then given haldol for possible gastroparesis. Will admit to Hospitalist      As part of my medical decision making, I reviewed the following data within the electronic MEDICAL RECORD NUMBER Nursing notes reviewed and incorporated, Labs reviewed , EKG interpreted , Old EKG reviewed, Old chart reviewed, Radiograph reviewed ,  Discussed with admitting physician , Notes from prior ED visits and [place] Controlled Substance Database    Pertinent labs & imaging results that were available during my care of the patient were reviewed by me and considered in my medical decision making (see chart for details).    ____________________________________________   FINAL CLINICAL IMPRESSION(S) / ED DIAGNOSES  Final diagnoses:  Intractable vomiting with nausea      NEW MEDICATIONS STARTED DURING THIS VISIT:  ED Discharge Orders    None       Note:  This document was prepared using Dragon voice recognition software and may include unintentional dictation errors.    Nita Sickle, MD 10/29/18 [phone]

## 2018-10-30 ENCOUNTER — Ambulatory Visit: Payer: Self-pay | Admitting: Family Medicine

## 2018-10-30 LAB — GLUCOSE, CAPILLARY
Glucose-Capillary: 160 mg/dL — ABNORMAL HIGH (ref 70–99)
Glucose-Capillary: 159 mg/dL — ABNORMAL HIGH (ref 70–99)
Glucose-Capillary: 146 mg/dL — ABNORMAL HIGH (ref 70–99)
Glucose-Capillary: 130 mg/dL — ABNORMAL HIGH (ref 70–99)

## 2018-10-30 MED ORDER — HYDRALAZINE HCL 20 MG/ML IJ SOLN
10.0000 mg | INTRAMUSCULAR | Status: DC | PRN
  Administered 2018-10-30: 03:00:00 10 mg via INTRAVENOUS
  Filled 2018-10-30 (×2): qty 1

## 2018-10-30 MED ORDER — SODIUM CHLORIDE 0.9 % IV SOLN
INTRAVENOUS | Status: AC
  Administered 2018-10-30: 03:00:00 via INTRAVENOUS

## 2018-10-30 MED ORDER — ONDANSETRON HCL 4 MG PO TABS: 4.0000 mg | ORAL_TABLET | Freq: Four times a day (QID) | ORAL | Status: DC | PRN

## 2018-10-30 MED ORDER — CLONIDINE HCL 0.1 MG/24HR TD PTWK
0.1000 mg | MEDICATED_PATCH | TRANSDERMAL | Status: DC
  Administered 2018-10-30: 0.1 mg via TRANSDERMAL
  Filled 2018-10-30: qty 1

## 2018-10-30 MED ORDER — MONTELUKAST SODIUM 10 MG PO TABS
10.0000 mg | ORAL_TABLET | Freq: Every day | ORAL | Status: DC
  Administered 2018-10-30 – 2018-11-02 (×4): 10 mg via ORAL
  Filled 2018-10-30 (×4): qty 1

## 2018-10-30 MED ORDER — PROMETHAZINE HCL 25 MG/ML IJ SOLN
12.5000 mg | Freq: Four times a day (QID) | INTRAMUSCULAR | Status: DC | PRN
  Administered 2018-10-30: 07:00:00 25 mg via INTRAVENOUS
  Filled 2018-10-30: qty 1

## 2018-10-30 MED ORDER — INSULIN ASPART 100 UNIT/ML ~~LOC~~ SOLN
0.0000 [IU] | Freq: Three times a day (TID) | SUBCUTANEOUS | Status: DC
  Administered 2018-10-30: 1 [IU] via SUBCUTANEOUS
  Administered 2018-10-30 – 2018-10-31 (×3): 2 [IU] via SUBCUTANEOUS
  Administered 2018-11-01 – 2018-11-02 (×2): 1 [IU] via SUBCUTANEOUS
  Filled 2018-10-30 (×6): qty 1

## 2018-10-30 MED ORDER — HYDROMORPHONE HCL 1 MG/ML IJ SOLN
0.5000 mg | INTRAMUSCULAR | Status: DC | PRN
  Administered 2018-10-30 (×2): 0.5 mg via INTRAVENOUS
  Filled 2018-10-30 (×2): qty 1

## 2018-10-30 MED ORDER — INSULIN ASPART 100 UNIT/ML ~~LOC~~ SOLN: 0.0000 [IU] | Freq: Every day | SUBCUTANEOUS | Status: DC

## 2018-10-30 MED ORDER — ONDANSETRON HCL 4 MG/2ML IJ SOLN
4.0000 mg | Freq: Four times a day (QID) | INTRAMUSCULAR | Status: DC | PRN
  Administered 2018-10-30 – 2018-10-31 (×2): 4 mg via INTRAVENOUS
  Filled 2018-10-30 (×2): qty 2

## 2018-10-30 MED ORDER — LABETALOL HCL 5 MG/ML IV SOLN: 10.0000 mg | INTRAVENOUS | Status: DC | PRN

## 2018-10-30 MED ORDER — METOCLOPRAMIDE HCL 5 MG/ML IJ SOLN
5.0000 mg | Freq: Four times a day (QID) | INTRAMUSCULAR | Status: DC
  Administered 2018-10-30 – 2018-11-02 (×13): 5 mg via INTRAVENOUS
  Filled 2018-10-30 (×13): qty 2

## 2018-10-30 MED ORDER — LISINOPRIL 10 MG PO TABS
10.0000 mg | ORAL_TABLET | Freq: Once | ORAL | Status: AC
  Administered 2018-10-30: 15:00:00 10 mg via ORAL
  Filled 2018-10-30: qty 1

## 2018-10-30 MED ORDER — SODIUM CHLORIDE 0.9% FLUSH: 10.0000 mL | INTRAVENOUS | Status: DC | PRN

## 2018-10-30 MED ORDER — ACETAMINOPHEN 325 MG PO TABS
650.0000 mg | ORAL_TABLET | Freq: Four times a day (QID) | ORAL | Status: DC | PRN
  Administered 2018-10-30 – 2018-11-01 (×2): 650 mg via ORAL
  Filled 2018-10-30 (×2): qty 2

## 2018-10-30 MED ORDER — ROSUVASTATIN CALCIUM 10 MG PO TABS
10.0000 mg | ORAL_TABLET | Freq: Every day | ORAL | Status: DC
  Administered 2018-10-30 – 2018-11-03 (×5): 10 mg via ORAL
  Filled 2018-10-30 (×6): qty 1

## 2018-10-30 MED ORDER — PANTOPRAZOLE SODIUM 40 MG PO TBEC
40.0000 mg | DELAYED_RELEASE_TABLET | Freq: Two times a day (BID) | ORAL | Status: DC
  Administered 2018-10-30 – 2018-11-03 (×9): 40 mg via ORAL
  Filled 2018-10-30 (×9): qty 1

## 2018-10-30 MED ORDER — METOPROLOL SUCCINATE ER 50 MG PO TB24
50.0000 mg | ORAL_TABLET | Freq: Every day | ORAL | Status: DC
  Administered 2018-10-30 – 2018-10-31 (×2): 50 mg via ORAL
  Filled 2018-10-30 (×3): qty 1

## 2018-10-30 MED ORDER — ALPRAZOLAM 1 MG PO TABS
2.0000 mg | ORAL_TABLET | Freq: Three times a day (TID) | ORAL | Status: DC
  Administered 2018-10-30 – 2018-11-02 (×10): 2 mg via ORAL
  Filled 2018-10-30 (×10): qty 2
  Filled 2018-10-30: qty 4
  Filled 2018-10-30: qty 2

## 2018-10-30 MED ORDER — FLUOXETINE HCL 20 MG PO CAPS
20.0000 mg | ORAL_CAPSULE | Freq: Every day | ORAL | Status: DC
  Administered 2018-10-30 – 2018-11-03 (×5): 20 mg via ORAL
  Filled 2018-10-30 (×5): qty 1

## 2018-10-30 MED ORDER — AMLODIPINE BESYLATE 5 MG PO TABS
5.0000 mg | ORAL_TABLET | Freq: Every day | ORAL | Status: DC
  Administered 2018-10-30 – 2018-10-31 (×2): 5 mg via ORAL
  Filled 2018-10-30 (×3): qty 1

## 2018-10-30 MED ORDER — ENOXAPARIN SODIUM 40 MG/0.4ML ~~LOC~~ SOLN
40.0000 mg | Freq: Two times a day (BID) | SUBCUTANEOUS | Status: DC
  Administered 2018-10-30 – 2018-11-02 (×8): 40 mg via SUBCUTANEOUS
  Filled 2018-10-30 (×8): qty 0.4

## 2018-10-30 MED ORDER — POTASSIUM CHLORIDE CRYS ER 20 MEQ PO TBCR
40.0000 meq | EXTENDED_RELEASE_TABLET | ORAL | Status: AC
  Administered 2018-10-30 (×2): 40 meq via ORAL
  Filled 2018-10-30: qty 2
  Filled 2018-10-30: qty 4

## 2018-10-30 MED ORDER — FUROSEMIDE 20 MG PO TABS
20.0000 mg | ORAL_TABLET | Freq: Every day | ORAL | Status: DC
  Administered 2018-10-31 – 2018-11-01 (×2): 20 mg via ORAL
  Filled 2018-10-30 (×2): qty 1

## 2018-10-30 MED ORDER — LAMOTRIGINE 100 MG PO TABS
100.0000 mg | ORAL_TABLET | Freq: Every day | ORAL | Status: DC
  Administered 2018-10-30 – 2018-11-03 (×5): 100 mg via ORAL
  Filled 2018-10-30 (×5): qty 1

## 2018-10-30 MED ORDER — LISINOPRIL 20 MG PO TABS
40.0000 mg | ORAL_TABLET | Freq: Every day | ORAL | Status: DC
  Administered 2018-10-31 – 2018-11-01 (×2): 40 mg via ORAL
  Filled 2018-10-30 (×2): qty 2

## 2018-10-30 MED ORDER — OXYCODONE HCL 5 MG PO TABS
5.0000 mg | ORAL_TABLET | ORAL | Status: DC | PRN
  Administered 2018-10-30 – 2018-11-03 (×13): 10 mg via ORAL
  Filled 2018-10-30 (×2): qty 2
  Filled 2018-10-30: qty 1
  Filled 2018-10-30 (×2): qty 2
  Filled 2018-10-30: qty 1
  Filled 2018-10-30 (×9): qty 2

## 2018-10-30 MED ORDER — SODIUM CHLORIDE 0.9% FLUSH
10.0000 mL | Freq: Two times a day (BID) | INTRAVENOUS | Status: DC
  Administered 2018-10-30 – 2018-11-02 (×6): 10 mL

## 2018-10-30 MED ORDER — TRAZODONE HCL 50 MG PO TABS
200.0000 mg | ORAL_TABLET | Freq: Every day | ORAL | Status: DC
  Administered 2018-10-30 – 2018-11-02 (×4): 200 mg via ORAL
  Filled 2018-10-30 (×5): qty 4

## 2018-10-30 MED ORDER — ACETAMINOPHEN 650 MG RE SUPP: 650.0000 mg | Freq: Four times a day (QID) | RECTAL | Status: DC | PRN

## 2018-10-30 MED ORDER — LISINOPRIL 20 MG PO TABS
30.0000 mg | ORAL_TABLET | Freq: Every day | ORAL | Status: DC
  Administered 2018-10-30: 30 mg via ORAL
  Filled 2018-10-30: qty 1

## 2018-10-30 NOTE — ED Notes (Addendum)
ED TO INPATIENT HANDOFF REPORT  ED Nurse Name and Phone #:  Elijah Birk RN   [phone]  S Name/Age/Gender Daisy [name] 53 y.o. female Room/Bed: ED08A/ED08A  Code Status   Code Status: Prior  Home/SNF/Other Home Patient oriented to: self, place, time and situation Is this baseline? Yes   Triage Complete: Triage complete  Chief Complaint nausea/ vomiting  Triage Note Pt to ED reporting hx of gastroparesis and a similar pain x 3 days. Increased pain x 3 days with nausea and vomiting. Pt has not been able to keep her 4 HTN medications down in three days as well and presents to the ED hypertensive at 215/119.   Pt reporting she fell from bed last week and caught her left ankle. Pain reported and swelling noted. Color is appropriate and pedal pulse is intact.    Allergies Allergies  Allergen Reactions  . Sulfasalazine Hives  . Acetaminophen-Codeine Hives  . Atorvastatin Other (See Comments)    Muscle pain  . Codeine Hives  . Ibuprofen Nausea And Vomiting  . Propoxyphene Other (See Comments)    Reaction:  GI upset   . Sulfa Antibiotics Hives  . Duloxetine Rash and Other (See Comments)    Yellowing of the skin    Level of Care/Admitting Diagnosis ED Disposition    ED Disposition Condition Comment   Admit  Hospital Area: [facility] [[id number]]  Level of Care: Med-Surg [16]  Covid Evaluation: Screening Protocol (No Symptoms)  Diagnosis: Intractable nausea and vomiting [720114]  Admitting Physician: Oralia Manis [[id number]]  Attending Physician: Oralia Manis [phone]  Estimated length of stay: past midnight tomorrow  Certification:: I certify this patient will need inpatient services for at least 2 midnights  PT Class (Do Not Modify): Inpatient [101]  PT Acc Code (Do Not Modify): Private [1]       B Medical/Surgery History Past Medical History:  Diagnosis Date  . Diabetes mellitus without complication (HCC)   . Gastritis   . Gastroparesis   . Hernia  of abdominal wall   . Hypertension   . Migraine    Past Surgical History:  Procedure Laterality Date  . ABDOMINAL HYSTERECTOMY    . APPENDECTOMY    . APPLICATION OF WOUND VAC    . CHOLECYSTECTOMY    . HERNIA REPAIR    . KNEE SURGERY       A IV Location/Drains/Wounds Patient Lines/Drains/Airways Status   Active Line/Drains/Airways    Name:   Placement date:   Placement time:   Site:   Days:   Peripheral IV 10/29/18 Right Antecubital   10/29/18    1857    Antecubital   1          Intake/Output Last 24 hours No intake or output data in the 24 hours ending 10/30/18 0141  Labs/Imaging Results for orders placed or performed during the hospital encounter of 10/29/18 (from the past 48 hour(s))  CBC with Differential/Platelet     Status: Abnormal   Collection Time: 10/29/18  6:49 PM  Result Value Ref Range   WBC 11.7 (H) 4.0 - 10.5 K/uL   RBC 4.47 3.87 - 5.11 MIL/uL   Hemoglobin 12.8 12.0 - 15.0 g/dL   HCT 78.2 95.6 - 21.3 %   MCV 88.4 80.0 - 100.0 fL   MCH 28.6 26.0 - 34.0 pg   MCHC 32.4 30.0 - 36.0 g/dL   RDW 08.6 57.8 - 46.9 %   Platelets 343 150 - 400 K/uL   nRBC  0.2 0.0 - 0.2 %   Neutrophils Relative % 76 %   Neutro Abs 8.9 (H) 1.7 - 7.7 K/uL   Lymphocytes Relative 21 %   Lymphs Abs 2.4 0.7 - 4.0 K/uL   Monocytes Relative 2 %   Monocytes Absolute 0.2 0.1 - 1.0 K/uL   Eosinophils Relative 0 %   Eosinophils Absolute 0.0 0.0 - 0.5 K/uL   Basophils Relative 0 %   Basophils Absolute 0.0 0.0 - 0.1 K/uL   Immature Granulocytes 1 %   Abs Immature Granulocytes 0.13 (H) 0.00 - 0.07 K/uL    Comment: Performed at [facility], [address] [place], [place] [address]  Comprehensive metabolic panel     Status: Abnormal   Collection Time: 10/29/18  6:49 PM  Result Value Ref Range   Sodium 139 135 - 145 mmol/L   Potassium 3.3 (L) 3.5 - 5.1 mmol/L   Chloride 102 98 - 111 mmol/L   CO2 25 22 - 32 mmol/L   Glucose, Bld 156 (H) 70 - 99 mg/dL   BUN 7 6 - 20 mg/dL    Creatinine, Ser 9.79 0.44 - 1.00 mg/dL   Calcium 9.3 8.9 - 48.0 mg/dL   Total Protein 8.4 (H) 6.5 - 8.1 g/dL   Albumin 4.4 3.5 - 5.0 g/dL   AST 18 15 - 41 U/L   ALT 14 0 - 44 U/L   Alkaline Phosphatase 59 38 - 126 U/L   Total Bilirubin 0.6 0.3 - 1.2 mg/dL   GFR calc non Af Amer >60 >60 mL/min   GFR calc Af Amer >60 >60 mL/min   Anion gap 12 5 - 15    Comment: Performed at [facility], [address] [place], [place] [address]  Lipase, blood     Status: None   Collection Time: 10/29/18  6:49 PM  Result Value Ref Range   Lipase 22 11 - 51 U/L    Comment: Performed at [facility], [address] [place], [place] [address]  Troponin I - ONCE - STAT     Status: None   Collection Time: 10/29/18  6:49 PM  Result Value Ref Range   Troponin I <0.03 <0.03 ng/mL    Comment: Performed at [facility], [address] [place], [place] [address]  Urinalysis, Complete w Microscopic     Status: Abnormal   Collection Time: 10/29/18  6:49 PM  Result Value Ref Range   Color, Urine STRAW (A) YELLOW   APPearance HAZY (A) CLEAR   Specific Gravity, Urine 1.010 1.005 - 1.030   pH 7.0 5.0 - 8.0   Glucose, UA NEGATIVE NEGATIVE mg/dL   Hgb urine dipstick SMALL (A) NEGATIVE   Bilirubin Urine NEGATIVE NEGATIVE   Ketones, ur NEGATIVE NEGATIVE mg/dL   Protein, ur 30 (A) NEGATIVE mg/dL   Nitrite NEGATIVE NEGATIVE   Leukocytes,Ua NEGATIVE NEGATIVE   RBC / HPF 11-20 0 - 5 RBC/hpf   WBC, UA 0-5 0 - 5 WBC/hpf   Bacteria, UA NONE SEEN NONE SEEN   Squamous Epithelial / LPF 0-5 0 - 5    Comment: Performed at [facility], [address] [place], [place] [address]  SARS Coronavirus 2 (CEPHEID - Performed in [facility] hospital lab), Hosp Order     Status: None   Collection Time: 10/29/18 10:44 PM  Result Value Ref Range   SARS Coronavirus 2 NEGATIVE NEGATIVE    Comment: (NOTE) If result is NEGATIVE SARS-CoV-2 target nucleic  acids are NOT DETECTED. The SARS-CoV-2  RNA is generally detectable in upper and lower  respiratory specimens during the acute phase of infection. The lowest  concentration of SARS-CoV-2 viral copies this assay can detect is 250  copies / mL. A negative result does not preclude SARS-CoV-2 infection  and should not be used as the sole basis for treatment or other  patient management decisions.  A negative result may occur with  improper specimen collection / handling, submission of specimen other  than nasopharyngeal swab, presence of viral mutation(s) within the  areas targeted by this assay, and inadequate number of viral copies  (<250 copies / mL). A negative result must be combined with clinical  observations, patient history, and epidemiological information. If result is POSITIVE SARS-CoV-2 target nucleic acids are DETECTED. The SARS-CoV-2 RNA is generally detectable in upper and lower  respiratory specimens dur ing the acute phase of infection.  Positive  results are indicative of active infection with SARS-CoV-2.  Clinical  correlation with patient history and other diagnostic information is  necessary to determine patient infection status.  Positive results do  not rule out bacterial infection or co-infection with other viruses. If result is PRESUMPTIVE POSTIVE SARS-CoV-2 nucleic acids MAY BE PRESENT.   A presumptive positive result was obtained on the submitted specimen  and confirmed on repeat testing.  While 2019 novel coronavirus  (SARS-CoV-2) nucleic acids may be present in the submitted sample  additional confirmatory testing may be necessary for epidemiological  and / or clinical management purposes  to differentiate between  SARS-CoV-2 and other Sarbecovirus currently known to infect humans.  If clinically indicated additional testing with an alternate test  methodology [phone]) is advised. The SARS-CoV-2 RNA is generally  detectable in upper and lower respiratory sp ecimens during the acute  phase of  infection. The expected result is Negative. Fact Sheet for Patients:  BoilerBrush.com.cy Fact Sheet for Healthcare Providers: https://pope.com/ This test is not yet approved or cleared by the [place] FDA and has been authorized for detection and/or diagnosis of SARS-CoV-2 by FDA under an Emergency Use Authorization (EUA).  This EUA will remain in effect (meaning this test can be used) for the duration of the COVID-19 declaration under Section 564(b)(1) of the Act, 21 U.S.C. section 360bbb-3(b)(1), unless the authorization is terminated or revoked sooner. Performed at [facility], [address] [place], [place] [address]    Ct Abdomen Pelvis W Contrast  Result Date: 10/29/2018 CLINICAL DATA:  52 y/o F; increasing abdominal pain for 3 days with nausea and vomiting. History of gastroparesis with similar symptoms. EXAM: CT ABDOMEN AND PELVIS WITH CONTRAST TECHNIQUE: Multidetector CT imaging of the abdomen and pelvis was performed using the standard protocol following bolus administration of intravenous contrast. CONTRAST:  OMNIPAQUE IOHEXOL 300 MG/ML  SOLN COMPARISON:  10/02/2017 CT abdomen pelvis. FINDINGS: Lower chest: Borderline cardiomegaly. Hepatobiliary: Mild hepatomegaly. No focal liver abnormality is seen. Status post cholecystectomy. No biliary dilatation. Pancreas: Unremarkable. No pancreatic ductal dilatation or surrounding inflammatory changes. Spleen: Normal in size without focal abnormality. Adrenals/Urinary Tract: Adrenal glands are unremarkable. Kidneys are normal, without renal calculi, focal lesion, or hydronephrosis. Bladder is unremarkable. Stomach/Bowel: Small hiatal hernia. Stomach is otherwise within normal limits. Appendectomy. No evidence of bowel wall thickening, distention, or inflammatory changes. Mild diverticulosis without findings of acute diverticulitis. Vascular/Lymphatic: No significant vascular  findings are present. No enlarged abdominal or pelvic lymph nodes. Reproductive: Status post hysterectomy. Left adnexal round homogeneous well-circumscribed cysts measuring up  to 3.8 cm. Other: Stable large broad-based hernia within the right anterolateral abdominal wall containing loops of small bowel without obstructive or inflammatory changes. Musculoskeletal: No fracture is seen. Lumbar spondylosis with prominent L4-5 and L5-S1 facet arthropathy. IMPRESSION: 1. New small hiatal hernia. 2. Stable mild hepatomegaly. 3. Stable mild diverticulosis without findings of acute diverticulitis. 4. Stable large broad-based right anterolateral abdominal wall hernia containing loops of small bowel without obstructive or inflammatory changes. 5. Left adnexal benign-appearing cyst measuring up to 3.8 cm, increased from prior study. If the patient is early postmenopausal/postmenopausal, follow-up ultrasound is recommended on a nonemergent basis. Electronically Signed   By: Mitzi HansenLance  Furusawa-Stratton M.D.   On: 10/29/2018 23:20    Pending Labs [employer] CorporationUnresulted Labs (From admission, onward)    Start     Ordered   Signed and Held  CBC  (enoxaparin (LOVENOX)    CrCl >/= 30 ml/min)  Once,   R    Comments:  Baseline for enoxaparin therapy IF NOT ALREADY DRAWN.  Notify MD if PLT < 100 K.    Signed and Held   Signed and Held  Creatinine, serum  (enoxaparin (LOVENOX)    CrCl >/= 30 ml/min)  Once,   R    Comments:  Baseline for enoxaparin therapy IF NOT ALREADY DRAWN.    Signed and Held   Signed and Held  Creatinine, serum  (enoxaparin (LOVENOX)    CrCl >/= 30 ml/min)  Weekly,   R    Comments:  while on enoxaparin therapy    Signed and Held   Signed and Held  Basic metabolic panel  Tomorrow morning,   R     Signed and Held   Signed and Held  CBC  Tomorrow morning,   R     Signed and Held          Vitals/Pain Today's Vitals   10/29/18 2247 10/30/18 0000 10/30/18 0030 10/30/18 0139  BP:  (!) 178/116 (!) 173/102 (!)  176/104  Pulse:  91  94  Resp:    20  Temp:    99.5 F (37.5 C)  TempSrc:    Oral  SpO2:  98%  96%  Weight:      Height:      PainSc: 10-Worst pain ever   8     Isolation Precautions No active isolations  Medications Medications  ondansetron (ZOFRAN) tablet 4 mg ( Oral See Alternative 10/30/18 0132)    Or  ondansetron (ZOFRAN) injection 4 mg (4 mg Intravenous Given 10/30/18 0132)  HYDROmorphone (DILAUDID) injection 0.5 mg (has no administration in time range)  ondansetron (ZOFRAN) injection 4 mg (4 mg Intravenous Given 10/29/18 1857)  sodium chloride 0.9 % bolus 1,000 mL (1,000 mLs Intravenous New Bag/Given 10/29/18 1857)  HYDROmorphone (DILAUDID) injection 1 mg (1 mg Intravenous Given 10/29/18 1856)  metoCLOPramide (REGLAN) injection 10 mg (10 mg Intravenous Given 10/29/18 2019)  fentaNYL (SUBLIMAZE) injection 50 mcg (50 mcg Intravenous Given 10/29/18 2019)  hydrALAZINE (APRESOLINE) injection 10 mg (10 mg Intravenous Given 10/29/18 2241)  haloperidol lactate (HALDOL) injection 2 mg (2 mg Intravenous Given 10/29/18 2240)  iohexol (OMNIPAQUE) 300 MG/ML solution 125 mL (150 mLs Intravenous Contrast Given 10/29/18 2258)  diazepam (VALIUM) injection 5 mg (5 mg Intravenous Given 10/30/18 0132)    Mobility walks Low fall risk   Focused Assessments Cardiac Assessment Handoff:    Lab Results  Component Value Date   CKTOTAL 127 06/14/2012   CKMB 1.6 06/14/2012   TROPONINI <0.03 10/29/2018  No results found for: DDIMER Does the Patient currently have chest pain? No     R Recommendations: See Admitting Provider Note  Report given to:  Matt RN  Additional Notes:

## 2018-10-30 NOTE — Plan of Care (Signed)

## 2018-10-30 NOTE — Progress Notes (Signed)
Anticoagulation monitoring(Lovenox):  53 yo female ordered Lovenox 40 mg Q24h  Filed Weights   10/29/18 1758  Weight: 283 lb (128.4 kg)   BMI 47   Lab Results  Component Value Date   CREATININE 0.78 10/29/2018   CREATININE 0.86 05/10/2018   CREATININE 0.62 04/17/2018   Estimated Creatinine Clearance: 113.7 mL/min (by C-G formula based on SCr of 0.78 mg/dL). Hemoglobin & Hematocrit     Component Value Date/Time   HGB 12.8 10/29/2018 1849   HGB 12.6 04/04/2014 1457   HCT 39.5 10/29/2018 1849   HCT 40.2 04/04/2014 1457     Per Protocol for Patient with estCrcl > 30 ml/min and BMI > 40, will transition to Lovenox 40 mg Q12h.

## 2018-10-30 NOTE — Progress Notes (Addendum)
[facility] [place] [facility]   PATIENT NAME: Daisy [name]    MR#:  [id number]  DATE OF BIRTH:  15-May-1966  SUBJECTIVE:  CHIEF COMPLAINT: Patient nausea and vomiting are better tolerating clear liquids.  Reporting epigastric pain but patient also reports she has chronic pain and she sees Dr. Thayer Ohm as an outpatient for pain management and takes oxycodone 10 mg every 4-6 hours as needed  REVIEW OF SYSTEMS:  CONSTITUTIONAL: No fever, fatigue or weakness.  EYES: No blurred or double vision.  EARS, NOSE, AND THROAT: No tinnitus or ear pain.  RESPIRATORY: No cough, shortness of breath, wheezing or hemoptysis.  CARDIOVASCULAR: No chest pain, orthopnea, edema.  GASTROINTESTINAL:  nausea, vomiting or improving.  Denies diarrhea.  Reporting epigastric abdominal pain and chronic pain GENITOURINARY: No dysuria, hematuria.  ENDOCRINE: No polyuria, nocturia,  HEMATOLOGY: No anemia, easy bruising or bleeding SKIN: No rash or lesion. MUSCULOSKELETAL: No joint pain or arthritis.   NEUROLOGIC: No tingling, numbness, weakness.  PSYCHIATRY: No anxiety or depression.   DRUG ALLERGIES:   Allergies  Allergen Reactions  . Sulfasalazine Hives  . Acetaminophen-Codeine Hives  . Atorvastatin Other (See Comments)    Muscle pain  . Codeine Hives  . Ibuprofen Nausea And Vomiting  . Propoxyphene Other (See Comments)    Reaction:  GI upset   . Sulfa Antibiotics Hives  . Duloxetine Rash and Other (See Comments)    Yellowing of the skin    VITALS:  Blood pressure (!) 168/98, pulse (!) 101, temperature 98.4 F (36.9 C), resp. rate 20, height 5\' 5"  (1.651 m), weight 128.9 kg, SpO2 98 %.  PHYSICAL EXAMINATION:  GENERAL:  53 y.o.-year-old patient lying in the bed with no acute distress.  EYES: Pupils equal, round, reactive to light and accommodation. No scleral icterus. Extraocular muscles intact.  HEENT: Head atraumatic, normocephalic. Oropharynx and nasopharynx clear.   NECK:  Supple, no jugular venous distention. No thyroid enlargement, no tenderness.  LUNGS: Normal breath sounds bilaterally, no wheezing, rales,rhonchi or crepitation. No use of accessory muscles of respiration.  CARDIOVASCULAR: S1, S2 normal. No murmurs, rubs, or gallops.  ABDOMEN: Soft, very minimal epigastric tenderness but no rebound tenderness nondistended. Bowel sounds present.  EXTREMITIES: No pedal edema, cyanosis, or clubbing.  NEUROLOGIC: Awake alert and oriented x3 sensation intact. Gait not checked.  PSYCHIATRIC: The patient is alert and oriented x 3.  SKIN: No obvious rash, lesion, or ulcer.    LABORATORY PANEL:   CBC Recent Labs  Lab 10/29/18 1849  WBC 11.7*  HGB 12.8  HCT 39.5  PLT 343   ------------------------------------------------------------------------------------------------------------------  Chemistries  Recent Labs  Lab 10/29/18 1849  NA 139  K 3.3*  CL 102  CO2 25  GLUCOSE 156*  BUN 7  CREATININE 0.78  CALCIUM 9.3  AST 18  ALT 14  ALKPHOS 59  BILITOT 0.6   ------------------------------------------------------------------------------------------------------------------  Cardiac Enzymes Recent Labs  Lab 10/29/18 1849  TROPONINI <0.03   ------------------------------------------------------------------------------------------------------------------  RADIOLOGY:  Ct Abdomen Pelvis W Contrast  Result Date: 10/29/2018 CLINICAL DATA:  53 y/o F; increasing abdominal pain for 3 days with nausea and vomiting. History of gastroparesis with similar symptoms. EXAM: CT ABDOMEN AND PELVIS WITH CONTRAST TECHNIQUE: Multidetector CT imaging of the abdomen and pelvis was performed using the standard protocol following bolus administration of intravenous contrast. CONTRAST:  OMNIPAQUE IOHEXOL 300 MG/ML  SOLN COMPARISON:  10/02/2017 CT abdomen pelvis. FINDINGS: Lower chest: Borderline cardiomegaly. Hepatobiliary: Mild hepatomegaly. No focal liver  abnormality is seen. Status post cholecystectomy. No biliary dilatation. Pancreas: Unremarkable. No pancreatic ductal dilatation or surrounding inflammatory changes. Spleen: Normal in size without focal abnormality. Adrenals/Urinary Tract: Adrenal glands are unremarkable. Kidneys are normal, without renal calculi, focal lesion, or hydronephrosis. Bladder is unremarkable. Stomach/Bowel: Small hiatal hernia. Stomach is otherwise within normal limits. Appendectomy. No evidence of bowel wall thickening, distention, or inflammatory changes. Mild diverticulosis without findings of acute diverticulitis. Vascular/Lymphatic: No significant vascular findings are present. No enlarged abdominal or pelvic lymph nodes. Reproductive: Status post hysterectomy. Left adnexal round homogeneous well-circumscribed cysts measuring up to 3.8 cm. Other: Stable large broad-based hernia within the right anterolateral abdominal wall containing loops of small bowel without obstructive or inflammatory changes. Musculoskeletal: No fracture is seen. Lumbar spondylosis with prominent L4-5 and L5-S1 facet arthropathy. IMPRESSION: 1. New small hiatal hernia. 2. Stable mild hepatomegaly. 3. Stable mild diverticulosis without findings of acute diverticulitis. 4. Stable large broad-based right anterolateral abdominal wall hernia containing loops of small bowel without obstructive or inflammatory changes. 5. Left adnexal benign-appearing cyst measuring up to 3.8 cm, increased from prior study. If the patient is early postmenopausal/postmenopausal, follow-up ultrasound is recommended on a nonemergent basis. Electronically Signed   By: Mitzi HansenLance  Furusawa-Stratton M.D.   On: 10/29/2018 23:20    EKG:   Orders placed or performed during the hospital encounter of 10/29/18  . EKG 12-Lead  . EKG 12-Lead    ASSESSMENT AND PLAN:   #Intractable nausea and vomiting from gastroparesis Clinically improving with the antiemetics and IV fluids Advance diet as  tolerated Patient is on Reglan and Phenergan at home, currently on IV Reglan, Phenergan and Zofran  #Hypokalemia Replete and recheck BMP and magnesium in a.m.  #Hypertensive urgency Continue home medication clonidine 0.1 mg patch and Norvasc. Currently patient is on lisinopril 30 mg increase the dose to 40 mg Continue home medication Toprol-XL 50 mg once daily Titrate medications as needed  #diabetes mellitus Insulin sliding scale hold metformin  #Hyperlipidemia continue statin  #Bipolar disorder-continue home medication trazodone  #Chronic pain-continue home medication oxycodone and outpatient follow-up with pain management Dr. Thayer Ohmhris  #Thrombocytopenia No bleeding or bruises Check CBC in a.m. and if platelet count is less than 100,000 will discontinue antiplatelet therapy/DVT prophylaxis     All the records are reviewed and case discussed with Care Management/Social Workerr. Management plans discussed with the patient, she is  in agreement.  CODE STATUS:  TOTAL TIME TAKING CARE OF THIS PATIENT: 35  minutes.   POSSIBLE D/C IN 1-2  DAYS, DEPENDING ON CLINICAL CONDITION.  Note: This dictation was prepared with Dragon dictation along with smaller phrase technology. Any transcriptional errors that result from this process are unintentional.   Ramonita LabAruna Glenville Espina M.D on 10/30/2018 at 1:03 PM  Between 7am to 6pm - Pager - [phone] After 6pm go to [email or web address] - password [email or web address] [facility]  [facility]  Office  [phone]  CC: Primary care physician; Malva LimesFisher, Donald E, MD

## 2018-10-30 NOTE — Progress Notes (Signed)
Orders received for clear liquid (advance as tolerated) by MD Gouru. Order placed.

## 2018-10-31 ENCOUNTER — Other Ambulatory Visit: Payer: Self-pay | Admitting: Family Medicine

## 2018-10-31 DIAGNOSIS — R05 Cough: Secondary | ICD-10-CM

## 2018-10-31 DIAGNOSIS — R059 Cough, unspecified: Secondary | ICD-10-CM

## 2018-10-31 LAB — GLUCOSE, CAPILLARY
Glucose-Capillary: 162 mg/dL — ABNORMAL HIGH (ref 70–99)
Glucose-Capillary: 116 mg/dL — ABNORMAL HIGH (ref 70–99)
Glucose-Capillary: 120 mg/dL — ABNORMAL HIGH (ref 70–99)
Glucose-Capillary: 127 mg/dL — ABNORMAL HIGH (ref 70–99)

## 2018-10-31 LAB — BASIC METABOLIC PANEL
Anion gap: 10 (ref 5–15)
BUN: 10 mg/dL (ref 6–20)
CO2: 24 mmol/L (ref 22–32)
Calcium: 9 mg/dL (ref 8.9–10.3)
Chloride: 100 mmol/L (ref 98–111)
Creatinine, Ser: 0.72 mg/dL (ref 0.44–1.00)
GFR calc Af Amer: >60 mL/min (ref >60)
GFR calc non Af Amer: >60 mL/min (ref >60)
Glucose, Bld: 156 mg/dL — ABNORMAL HIGH (ref 70–99)
Potassium: 3.6 mmol/L (ref 3.5–5.1)
Sodium: 134 mmol/L — ABNORMAL LOW (ref 135–145)

## 2018-10-31 LAB — CBC
HCT: 41.4 % (ref 36.0–46.0)
Hemoglobin: 13.4 g/dL (ref 12.0–15.0)
MCH: 28.5 pg (ref 26.0–34.0)
MCHC: 32.4 g/dL (ref 30.0–36.0)
MCV: 88.1 fL (ref 80.0–100.0)
Platelets: 347 10*3/uL (ref 150–400)
RBC: 4.7 MIL/uL (ref 3.87–5.11)
RDW: 13.7 % (ref 11.5–15.5)
WBC: 9.7 10*3/uL (ref 4.0–10.5)
nRBC: 0 % (ref 0.0–0.2)

## 2018-10-31 LAB — MAGNESIUM: Magnesium: 2.2 mg/dL (ref 1.7–2.4)

## 2018-10-31 NOTE — Progress Notes (Signed)
[facility] [place] at [facility]   PATIENT NAME: Daisy MangesDanita [name]    MR#:  [id number]  DATE OF BIRTH:  1966/04/18  SUBJECTIVE:  CHIEF COMPLAINT: Patients still nauseated and not comfortable to advance her diet ,reporting epigastric pain 6/10 but patient also reports she has chronic pain and she sees Dr. Thayer Ohmhris as an outpatient for pain management and takes oxycodone 10 mg every 4-6 hours as needed  REVIEW OF SYSTEMS:  CONSTITUTIONAL: No fever, fatigue or weakness.  EYES: No blurred or double vision.  EARS, NOSE, AND THROAT: No tinnitus or ear pain.  RESPIRATORY: No cough, shortness of breath, wheezing or hemoptysis.  CARDIOVASCULAR: No chest pain, orthopnea, edema.  GASTROINTESTINAL:  nausea, vomiting or improving.  Denies diarrhea.  Reporting epigastric abdominal pain and chronic pain GENITOURINARY: No dysuria, hematuria.  ENDOCRINE: No polyuria, nocturia,  HEMATOLOGY: No anemia, easy bruising or bleeding SKIN: No rash or lesion. MUSCULOSKELETAL: No joint pain or arthritis.   NEUROLOGIC: No tingling, numbness, weakness.  PSYCHIATRY: No anxiety or depression.   DRUG ALLERGIES:   Allergies  Allergen Reactions  . Sulfasalazine Hives  . Acetaminophen-Codeine Hives  . Atorvastatin Other (See Comments)    Muscle pain  . Codeine Hives  . Ibuprofen Nausea And Vomiting  . Propoxyphene Other (See Comments)    Reaction:  GI upset   . Sulfa Antibiotics Hives  . Duloxetine Rash and Other (See Comments)    Yellowing of the skin    VITALS:  Blood pressure 119/78, pulse 81, temperature 98.3 F (36.8 C), temperature source Oral, resp. rate 19, height 5\' 5"  (1.651 m), weight 128.9 kg, SpO2 95 %.  PHYSICAL EXAMINATION:  GENERAL:  53 y.o.-year-old patient lying in the bed with no acute distress.  EYES: Pupils equal, round, reactive to light and accommodation. No scleral icterus. Extraocular muscles intact.  HEENT: Head atraumatic, normocephalic. Oropharynx and  nasopharynx clear.  NECK:  Supple, no jugular venous distention. No thyroid enlargement, no tenderness.  LUNGS: Normal breath sounds bilaterally, no wheezing, rales,rhonchi or crepitation. No use of accessory muscles of respiration.  CARDIOVASCULAR: S1, S2 normal. No murmurs, rubs, or gallops.  ABDOMEN: Soft, very minimal epigastric tenderness but no rebound tenderness nondistended. Bowel sounds present.  EXTREMITIES: No pedal edema, cyanosis, or clubbing.  NEUROLOGIC: Awake alert and oriented x3 sensation intact. Gait not checked.  PSYCHIATRIC: The patient is alert and oriented x 3.  SKIN: No obvious rash, lesion, or ulcer.    LABORATORY PANEL:   CBC Recent Labs  Lab 10/31/18 0801  WBC 9.7  HGB 13.4  HCT 41.4  PLT 347   ------------------------------------------------------------------------------------------------------------------  Chemistries  Recent Labs  Lab 10/29/18 1849 10/31/18 0402 10/31/18 0801  NA 139  --  134*  K 3.3*  --  3.6  CL 102  --  100  CO2 25  --  24  GLUCOSE 156*  --  156*  BUN 7  --  10  CREATININE 0.78  --  0.72  CALCIUM 9.3  --  9.0  MG  --  2.2  --   AST 18  --   --   ALT 14  --   --   ALKPHOS 59  --   --   BILITOT 0.6  --   --    ------------------------------------------------------------------------------------------------------------------  Cardiac Enzymes Recent Labs  Lab 10/29/18 1849  TROPONINI <0.03   ------------------------------------------------------------------------------------------------------------------  RADIOLOGY:  Ct Abdomen Pelvis W Contrast  Result Date: 10/29/2018 CLINICAL DATA:  53 y/o F;  increasing abdominal pain for 3 days with nausea and vomiting. History of gastroparesis with similar symptoms. EXAM: CT ABDOMEN AND PELVIS WITH CONTRAST TECHNIQUE: Multidetector CT imaging of the abdomen and pelvis was performed using the standard protocol following bolus administration of intravenous contrast. CONTRAST:   OMNIPAQUE IOHEXOL 300 MG/ML  SOLN COMPARISON:  10/02/2017 CT abdomen pelvis. FINDINGS: Lower chest: Borderline cardiomegaly. Hepatobiliary: Mild hepatomegaly. No focal liver abnormality is seen. Status post cholecystectomy. No biliary dilatation. Pancreas: Unremarkable. No pancreatic ductal dilatation or surrounding inflammatory changes. Spleen: Normal in size without focal abnormality. Adrenals/Urinary Tract: Adrenal glands are unremarkable. Kidneys are normal, without renal calculi, focal lesion, or hydronephrosis. Bladder is unremarkable. Stomach/Bowel: Small hiatal hernia. Stomach is otherwise within normal limits. Appendectomy. No evidence of bowel wall thickening, distention, or inflammatory changes. Mild diverticulosis without findings of acute diverticulitis. Vascular/Lymphatic: No significant vascular findings are present. No enlarged abdominal or pelvic lymph nodes. Reproductive: Status post hysterectomy. Left adnexal round homogeneous well-circumscribed cysts measuring up to 3.8 cm. Other: Stable large broad-based hernia within the right anterolateral abdominal wall containing loops of small bowel without obstructive or inflammatory changes. Musculoskeletal: No fracture is seen. Lumbar spondylosis with prominent L4-5 and L5-S1 facet arthropathy. IMPRESSION: 1. New small hiatal hernia. 2. Stable mild hepatomegaly. 3. Stable mild diverticulosis without findings of acute diverticulitis. 4. Stable large broad-based right anterolateral abdominal wall hernia containing loops of small bowel without obstructive or inflammatory changes. 5. Left adnexal benign-appearing cyst measuring up to 3.8 cm, increased from prior study. If the patient is early postmenopausal/postmenopausal, follow-up ultrasound is recommended on a nonemergent basis. Electronically Signed   By: Mitzi Hansen M.D.   On: 10/29/2018 23:20    EKG:   Orders placed or performed during the hospital encounter of 10/29/18  . EKG  12-Lead  . EKG 12-Lead    ASSESSMENT AND PLAN:   #Intractable nausea and vomiting from gastroparesis Clinically no significant improvement Continue with the antiemetics and IV fluids Advance diet as tolerated Patient is on Reglan and Phenergan at home, currently on IV Reglan, Phenergan and Zofran Will consider adding erythromycin to the regimen if no improvement  #Hypokalemia Repleted and potassium at 3.6  #Hypertensive urgency Clinically better Continue home medication clonidine 0.1 mg patch and Norvasc. Currently patient is on lisinopril 30 mg increase the dose to 40 mg Continue home medication Toprol-XL 50 mg once daily Titrate medications as needed  #diabetes mellitus Insulin sliding scale hold metformin  #Hyperlipidemia continue statin  #Bipolar disorder-continue home medication trazodone  #Chronic pain-continue home medication oxycodone and outpatient follow-up with pain management Dr. Thayer Ohm      All the records are reviewed and case discussed with Care Management/Social Workerr. Management plans discussed with the patient, she is  in agreement.  CODE STATUS:  TOTAL TIME TAKING CARE OF THIS PATIENT: 35  minutes.   POSSIBLE D/C IN 1-2  DAYS, DEPENDING ON CLINICAL CONDITION.  Note: This dictation was prepared with Dragon dictation along with smaller phrase technology. Any transcriptional errors that result from this process are unintentional.   Ramonita Lab M.D on 10/31/2018 at 7:36 PM  Between 7am to 6pm - Pager - [phone] After 6pm go to [email or web address] - password [email or web address] [facility]  [facility]  Office  [phone]  CC: Primary care physician; Malva Limes, MD

## 2018-11-01 LAB — POCT CBG MONITORING: CBG: 98

## 2018-11-01 LAB — BASIC METABOLIC PANEL
Anion gap: 11 (ref 5–15)
BUN: 13 mg/dL (ref 6–20)
CO2: 24 mmol/L (ref 22–32)
Calcium: 8.7 mg/dL — ABNORMAL LOW (ref 8.9–10.3)
Chloride: 97 mmol/L — ABNORMAL LOW (ref 98–111)
Creatinine, Ser: 0.97 mg/dL (ref 0.44–1.00)
GFR calc Af Amer: >60 mL/min (ref >60)
GFR calc non Af Amer: >60 mL/min (ref >60)
Glucose, Bld: 156 mg/dL — ABNORMAL HIGH (ref 70–99)
Potassium: 3.6 mmol/L (ref 3.5–5.1)
Sodium: 132 mmol/L — ABNORMAL LOW (ref 135–145)

## 2018-11-01 LAB — GLUCOSE, CAPILLARY
Glucose-Capillary: 133 mg/dL — ABNORMAL HIGH (ref 70–99)
Glucose-Capillary: 98 mg/dL (ref 70–99)
Glucose-Capillary: 100 mg/dL — ABNORMAL HIGH (ref 70–99)
Glucose-Capillary: 145 mg/dL — ABNORMAL HIGH (ref 70–99)

## 2018-11-01 MED ORDER — ERYTHROMYCIN BASE 250 MG PO TBEC
500.0000 mg | DELAYED_RELEASE_TABLET | Freq: Three times a day (TID) | ORAL | Status: DC
  Administered 2018-11-01 – 2018-11-03 (×8): 500 mg via ORAL
  Filled 2018-11-01 (×11): qty 2

## 2018-11-01 NOTE — Progress Notes (Signed)
[facility] - [place] at [facility]   PATIENT NAME: Daisy [name]    MR#:  [id number]  DATE OF BIRTH:  01-03-1966  SUBJECTIVE:  CHIEF COMPLAINT: Patients had 3 episodes of vomiting yesterday but nothing overnight .feeling weak and tired.  Denies any abdominal pain today patient reports she has chronic pain and she sees Dr. Thayer Ohm as an outpatient for pain management and takes oxycodone 10 mg every 4-6 hours as needed  REVIEW OF SYSTEMS:  CONSTITUTIONAL: No fever, fatigue or weakness.  EYES: No blurred or double vision.  EARS, NOSE, AND THROAT: No tinnitus or ear pain.  RESPIRATORY: No cough, shortness of breath, wheezing or hemoptysis.  CARDIOVASCULAR: No chest pain, orthopnea, edema.  GASTROINTESTINAL:  nausea, vomiting or improving.  Denies diarrhea.  Reporting epigastric abdominal pain and chronic pain GENITOURINARY: No dysuria, hematuria.  ENDOCRINE: No polyuria, nocturia,  HEMATOLOGY: No anemia, easy bruising or bleeding SKIN: No rash or lesion. MUSCULOSKELETAL: No joint pain or arthritis.   NEUROLOGIC: No tingling, numbness, weakness.  PSYCHIATRY: No anxiety or depression.   DRUG ALLERGIES:   Allergies  Allergen Reactions  . Sulfasalazine Hives  . Acetaminophen-Codeine Hives  . Atorvastatin Other (See Comments)    Muscle pain  . Codeine Hives  . Ibuprofen Nausea And Vomiting  . Propoxyphene Other (See Comments)    Reaction:  GI upset   . Sulfa Antibiotics Hives  . Duloxetine Rash and Other (See Comments)    Yellowing of the skin    VITALS:  Blood pressure 120/69, pulse 83, temperature 98.6 F (37 C), temperature source Oral, resp. rate 19, height 5\' 5"  (1.651 m), weight 128.9 kg, SpO2 93 %.  PHYSICAL EXAMINATION:  GENERAL:  53 y.o.-year-old patient lying in the bed with no acute distress.  EYES: Pupils equal, round, reactive to light and accommodation. No scleral icterus. Extraocular muscles intact.  HEENT: Head atraumatic,  normocephalic. Oropharynx and nasopharynx clear.  NECK:  Supple, no jugular venous distention. No thyroid enlargement, no tenderness.  LUNGS: Normal breath sounds bilaterally, no wheezing, rales,rhonchi or crepitation. No use of accessory muscles of respiration.  CARDIOVASCULAR: S1, S2 normal. No murmurs, rubs, or gallops.  ABDOMEN: Soft, nontender ,no rebound tenderness nondistended. Bowel sounds present.  EXTREMITIES: No pedal edema, cyanosis, or clubbing.  NEUROLOGIC: Awake alert and oriented x3 sensation intact. Gait not checked.  PSYCHIATRIC: The patient is alert and oriented x 3.  SKIN: No obvious rash, lesion, or ulcer.    LABORATORY PANEL:   CBC Recent Labs  Lab 10/31/18 0801  WBC 9.7  HGB 13.4  HCT 41.4  PLT 347   ------------------------------------------------------------------------------------------------------------------  Chemistries  Recent Labs  Lab 10/29/18 1849 10/31/18 0402  11/01/18 0355  NA 139  --    < > 132*  K 3.3*  --    < > 3.6  CL 102  --    < > 97*  CO2 25  --    < > 24  GLUCOSE 156*  --    < > 156*  BUN 7  --    < > 13  CREATININE 0.78  --    < > 0.97  CALCIUM 9.3  --    < > 8.7*  MG  --  2.2  --   --   AST 18  --   --   --   ALT 14  --   --   --   ALKPHOS 59  --   --   --  BILITOT 0.6  --   --   --    < > = values in this interval not displayed.   ------------------------------------------------------------------------------------------------------------------  Cardiac Enzymes Recent Labs  Lab 10/29/18 1849  TROPONINI <0.03   ------------------------------------------------------------------------------------------------------------------  RADIOLOGY:  No results found.  EKG:   Orders placed or performed during the hospital encounter of 10/29/18  . EKG 12-Lead  . EKG 12-Lead    ASSESSMENT AND PLAN:   #Intractable nausea and vomiting from gastroparesis Clinically improving and then again deteriorating Continue with the  antiemetics and IV fluids Advance diet as tolerated Patient is on Reglan and Phenergan at home, currently on IV Reglan, Phenergan and Zofran  adding erythromycin to the regimen -patient is agreeable  #Hypo natremia Gentle hydration with IV fluids and check in a.m.  #Hypertensive urgency Clinically better Continue home medication clonidine 0.1 mg patch and Norvasc. Currently patient is on lisinopril 30 mg increase the dose to 40 mg Continue home medication Toprol-XL 50 mg once daily Titrate medications as needed  #diabetes mellitus Insulin sliding scale hold metformin  #Hyperlipidemia continue statin  #Bipolar disorder-continue home medication trazodone  #Chronic pain-continue home medication oxycodone and outpatient follow-up with pain management Dr. Thayer Ohmhris      All the records are reviewed and case discussed with Care Management/Social Workerr. Management plans discussed with the patient, she is  in agreement.  CODE STATUS:  TOTAL TIME TAKING CARE OF THIS PATIENT: 35  minutes.   POSSIBLE D/C IN 1-  DAYS, DEPENDING ON CLINICAL CONDITION.  Note: This dictation was prepared with Dragon dictation along with smaller phrase technology. Any transcriptional errors that result from this process are unintentional.   Ramonita LabAruna Estalene Bergey M.D on 11/01/2018 at 12:11 PM  Between 7am to 6pm - Pager - [phone] After 6pm go to [email or web address] - password [email or web address] [facility]  [facility] [place] [facility]  Office  [phone]  CC: Primary care physician; Malva LimesFisher, Donald E, MD

## 2018-11-01 NOTE — Care Management Important Message (Signed)
Important Message  Patient Details  Name: Daisy [name] MRN: [id number] Date of Birth: 10/23/1965   Medicare Important Message Given:  Yes    Olegario Messier A Lindaann Gradilla 11/01/2018, 10:58 AM

## 2018-11-02 LAB — BASIC METABOLIC PANEL
Anion gap: 12 (ref 5–15)
BUN: 30 mg/dL — ABNORMAL HIGH (ref 6–20)
CO2: 23 mmol/L (ref 22–32)
Calcium: 8.2 mg/dL — ABNORMAL LOW (ref 8.9–10.3)
Chloride: 96 mmol/L — ABNORMAL LOW (ref 98–111)
Creatinine, Ser: 1.42 mg/dL — ABNORMAL HIGH (ref 0.44–1.00)
GFR calc Af Amer: 50 mL/min — ABNORMAL LOW (ref >60)
GFR calc non Af Amer: 43 mL/min — ABNORMAL LOW (ref >60)
Glucose, Bld: 111 mg/dL — ABNORMAL HIGH (ref 70–99)
Potassium: 3.8 mmol/L (ref 3.5–5.1)
Sodium: 131 mmol/L — ABNORMAL LOW (ref 135–145)

## 2018-11-02 LAB — GLUCOSE, CAPILLARY
Glucose-Capillary: 111 mg/dL — ABNORMAL HIGH (ref 70–99)
Glucose-Capillary: 127 mg/dL — ABNORMAL HIGH (ref 70–99)
Glucose-Capillary: 119 mg/dL — ABNORMAL HIGH (ref 70–99)

## 2018-11-02 MED ORDER — BISACODYL 10 MG RE SUPP: 10.0000 mg | Freq: Every day | RECTAL | Status: DC | PRN

## 2018-11-02 MED ORDER — SORBITOL 70 % SOLN
960.0000 mL | TOPICAL_OIL | Freq: Once | ORAL | Status: DC | PRN
  Filled 2018-11-02: qty 473

## 2018-11-02 MED ORDER — SODIUM CHLORIDE 0.9 % IV BOLUS: 500.0000 mL | Freq: Once | INTRAVENOUS | Status: DC

## 2018-11-02 MED ORDER — SODIUM CHLORIDE 0.9 % IV SOLN
INTRAVENOUS | Status: DC
  Administered 2018-11-02 (×2): via INTRAVENOUS

## 2018-11-02 MED ORDER — SODIUM CHLORIDE 0.9 % IV BOLUS
1000.0000 mL | Freq: Once | INTRAVENOUS | Status: AC
  Administered 2018-11-02: 08:00:00 1000 mL via INTRAVENOUS

## 2018-11-02 MED ORDER — INSULIN ASPART 100 UNIT/ML ~~LOC~~ SOLN: 0.0000 [IU] | Freq: Every evening | SUBCUTANEOUS | Status: DC | PRN

## 2018-11-02 MED ORDER — METOCLOPRAMIDE HCL 5 MG PO TABS
10.0000 mg | ORAL_TABLET | Freq: Three times a day (TID) | ORAL | Status: DC
  Administered 2018-11-02 – 2018-11-03 (×2): 10 mg via ORAL
  Filled 2018-11-02 (×2): qty 2

## 2018-11-02 MED ORDER — POLYETHYLENE GLYCOL 3350 17 G PO PACK
17.0000 g | PACK | Freq: Every day | ORAL | Status: DC
  Administered 2018-11-02: 09:00:00 17 g via ORAL
  Filled 2018-11-02: qty 1

## 2018-11-02 MED ORDER — BISACODYL 10 MG RE SUPP: 10.0000 mg | Freq: Every day | RECTAL | Status: DC

## 2018-11-02 NOTE — Progress Notes (Signed)
[facility] - [place] at [facility]   PATIENT NAME: Daisy [name]    MR#:  [id number]  DATE OF BIRTH:  [birth year]  SUBJECTIVE:  CHIEF COMPLAINT: Patients nausea and vomiting significantly improved with erythromycin  patient reports she has chronic pain and she sees Dr. Thayer Ohm as an outpatient for pain management and takes oxycodone 10 mg every 4-6 hours as needed  REVIEW OF SYSTEMS:  CONSTITUTIONAL: No fever, fatigue or weakness.  EYES: No blurred or double vision.  EARS, NOSE, AND THROAT: No tinnitus or ear pain.  RESPIRATORY: No cough, shortness of breath, wheezing or hemoptysis.  CARDIOVASCULAR: No chest pain, orthopnea, edema.  GASTROINTESTINAL:  nausea, vomiting or improving.  Denies diarrhea.  Reporting epigastric abdominal pain and chronic pain GENITOURINARY: No dysuria, hematuria.  ENDOCRINE: No polyuria, nocturia,  HEMATOLOGY: No anemia, easy bruising or bleeding SKIN: No rash or lesion. MUSCULOSKELETAL: No joint pain or arthritis.   NEUROLOGIC: No tingling, numbness, weakness.  PSYCHIATRY: No anxiety or depression.   DRUG ALLERGIES:   Allergies  Allergen Reactions  . Sulfasalazine Hives  . Acetaminophen-Codeine Hives  . Atorvastatin Other (See Comments)    Muscle pain  . Codeine Hives  . Ibuprofen Nausea And Vomiting  . Propoxyphene Other (See Comments)    Reaction:  GI upset   . Sulfa Antibiotics Hives  . Duloxetine Rash and Other (See Comments)    Yellowing of the skin    VITALS:  Blood pressure (!) 93/59, pulse 88, temperature 98.2 F (36.8 C), temperature source Oral, resp. rate 18, height 5\' 5"  (1.651 m), weight 128.9 kg, SpO2 96 %.  PHYSICAL EXAMINATION:  GENERAL:  53 y.o.-year-old patient lying in the bed with no acute distress.  EYES: Pupils equal, round, reactive to light and accommodation. No scleral icterus. Extraocular muscles intact.  HEENT: Head atraumatic, normocephalic. Oropharynx and nasopharynx clear.  NECK:   Supple, no jugular venous distention. No thyroid enlargement, no tenderness.  LUNGS: Normal breath sounds bilaterally, no wheezing, rales,rhonchi or crepitation. No use of accessory muscles of respiration.  CARDIOVASCULAR: S1, S2 normal. No murmurs, rubs, or gallops.  ABDOMEN: Soft, nontender ,no rebound tenderness nondistended. Bowel sounds present.  EXTREMITIES: No pedal edema, cyanosis, or clubbing.  NEUROLOGIC: Awake alert and oriented x3 sensation intact. Gait not checked.  PSYCHIATRIC: The patient is alert and oriented x 3.  SKIN: No obvious rash, lesion, or ulcer.    LABORATORY PANEL:   CBC Recent Labs  Lab 10/31/18 0801  WBC 9.7  HGB 13.4  HCT 41.4  PLT 347   ------------------------------------------------------------------------------------------------------------------  Chemistries  Recent Labs  Lab 10/29/18 1849 10/31/18 0402  11/02/18 0346  NA 139  --    < > 131*  K 3.3*  --    < > 3.8  CL 102  --    < > 96*  CO2 25  --    < > 23  GLUCOSE 156*  --    < > 111*  BUN 7  --    < > 30*  CREATININE 0.78  --    < > 1.42*  CALCIUM 9.3  --    < > 8.2*  MG  --  2.2  --   --   AST 18  --   --   --   ALT 14  --   --   --   ALKPHOS 59  --   --   --   BILITOT 0.6  --   --   --    < > =  values in this interval not displayed.   ------------------------------------------------------------------------------------------------------------------  Cardiac Enzymes Recent Labs  Lab 10/29/18 1849  TROPONINI <0.03   ------------------------------------------------------------------------------------------------------------------  RADIOLOGY:  No results found.  EKG:   Orders placed or performed during the hospital encounter of 10/29/18  . EKG 12-Lead  . EKG 12-Lead    ASSESSMENT AND PLAN:  #Acute kidney injury From decreased p.o. intake from nausea and vomiting Provide IV fluids and avoid nephrotoxins Check BMP in a.m. We will consider renal ultrasound if no  improvement  #Intractable nausea and vomiting from gastroparesis Clinically improving and then again deteriorating Continue with the antiemetics and IV fluids Advance diet as tolerated Patient is on Reglan and Phenergan at home, currently on  Reglan po  Phenergan  Prn and Zofran po   added  erythromycin to the regimen -patient is clinically feeling better and would continue the same  #Hypo natremia Gentle hydration with IV fluids and check BMP in a.m.  #Hypo tension Clinically better Hold home medication clonidine 0.1 mg patch ,Norvasc, lisinopril and Toprol-XL 50 mg IV fluids Titrate medications as needed  #diabetes mellitus Insulin sliding scale hold metformin  #Hyperlipidemia continue statin  #Bipolar disorder-continue home medication trazodone  #Chronic pain-continue home medication oxycodone and outpatient follow-up with pain management Dr. Thayer Ohmhris      All the records are reviewed and case discussed with Care Management/Social Workerr. Management plans discussed with the patient, she is  in agreement.  CODE STATUS:  TOTAL TIME TAKING CARE OF THIS PATIENT: 35  minutes.   POSSIBLE D/C IN 1-  DAYS, DEPENDING ON CLINICAL CONDITION.  Note: This dictation was prepared with Dragon dictation along with smaller phrase technology. Any transcriptional errors that result from this process are unintentional.   Ramonita LabAruna Surya Schroeter M.D on 11/02/2018 at 11:58 AM  Between 7am to 6pm - Pager - [phone] After 6pm go to [email or web address] - password [email or web address] [facility]  [facility]  Office  [phone]  CC: Primary care physician; Malva LimesFisher, Donald E, MD

## 2018-11-02 NOTE — TOC Initial Note (Signed)
Transition of Care [facility]) - Initial/Assessment Note    Patient Details  Name: Daisy [name] MRN: [id number] Date of Birth: September 12, 1965  Transition of Care [facility]) CM/SW Contact:    Annamaria Boots, Ingen[place] Phone Number: 11/02/2018, 4:06 PM  Clinical Narrative:   Patient is at moderate risk for readmission. CSW met with patient to discuss discharge plan. Patient reports that she lives with her mother and son. 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 Currently patient states that her son is caring for mother while she is here in the hospital. Per patient, her PCP is Dr. Caryn Section and she gets her medications through mail order. Patient reports that she has a shower chair at home and a walker but she currently does not need them. Per patient, her son provides transportation for her at this time until she can buy a car. Patient states that she has no needs at this time.                Expected Discharge Plan: Home/Self Care Barriers to Discharge: Continued Medical Work up   Patient Goals and CMS Choice Patient states their goals for this hospitalization and ongoing recovery are:: I need to return home to take care of my mother.  CMS Medicare.gov Compare Post Acute Care list provided to:: Patient    Expected Discharge Plan and Services Expected Discharge Plan: Home/Self Care         Expected Discharge Date: 11/01/18                                    Prior Living Arrangements/Services   Lives with:: Adult Children, Parents Patient language and need for interpreter reviewed:: Yes Do you feel safe going back to the place where you live?: Yes      Need for Family Participation in Patient Care: No (Comment) Care giver support system in place?: No (comment)   Criminal Activity/Legal Involvement Pertinent to Current Situation/Hospitalization: No - Comment as needed  Activities of Daily Living Home Assistive Devices/Equipment: None ADL Screening (condition at time  of admission) Patient's cognitive ability adequate to safely complete daily activities?: Yes Is the patient deaf or have difficulty hearing?: No Does the patient have difficulty seeing, even when wearing glasses/contacts?: No Does the patient have difficulty concentrating, remembering, or making decisions?: No Patient able to express need for assistance with ADLs?: Yes Does the patient have difficulty dressing or bathing?: No Independently performs ADLs?: Yes (appropriate for developmental age) Does the patient have difficulty walking or climbing stairs?: No Weakness of Legs: None Weakness of Arms/Hands: None  Permission Sought/Granted Permission sought to share information with : Case Manager, Customer service manager, Family Supports Permission granted to share information with : Yes, Verbal Permission Granted              Emotional Assessment Appearance:: Appears stated age   Affect (typically observed): Accepting, Calm Orientation: : Oriented to Self, Oriented to Place, Oriented to  Time, Oriented to Situation Alcohol / Substance Use: Not Applicable Psych Involvement: No (comment)  Admission diagnosis:  Intractable vomiting with nausea [R11.2] Patient Active Problem List   Diagnosis Date Noted  . Accelerated hypertension 10/29/2018  . Intractable nausea and vomiting 10/29/2018  . Seasonal allergic rhinitis due to pollen 09/01/2018  . Nausea & vomiting 01/14/2018  . Allergic rhinitis 04/18/2017  . Gastroparesis 01/03/2017  . Hyperlipidemia 06/18/2016  . DMII (diabetes mellitus,  type 2) ([place]) 06/18/2016  . Bipolar 2 disorder ([place]) 06/18/2016  . Depression 06/18/2016  . GERD (gastroesophageal reflux disease) 06/18/2016  . Hypertension 06/18/2016  . Ingrown toenail 06/18/2016  . Hypokalemia 09/28/2015  . Chronic abdominal pain 05/23/2015  . Complex ovarian cyst 05/23/2015  . Anxiety 03/27/2015  . Insomnia 03/27/2015  . Gastritis 01/07/2015  . DDD (degenerative  disc disease), lumbar 11/28/2014  . DJD (degenerative joint disease) of knee 11/28/2014  . Migraine headache 11/28/2014  . Bilateral occipital neuralgia 11/28/2014  . Morbid obesity ([place]) 11/28/2014  . Neuropathy due to secondary diabetes ([place]) 11/28/2014  . Abdominal wall abscess 09/20/2014  . Back pain, chronic 09/20/2014  . Incisional hernia, without obstruction or gangrene 04/30/2014  . Bilateral chronic knee pain 03/04/2014   PCP:  Birdie Sons, MD Pharmacy:   [facility], [place] [address] Phone: [phone] Fax: [phone]  [id number] [place] [place] [address] [place]: [phone]: [address] [address]: [phone]: [address]Phone: [phone]: [phone] [address]Phone: [phone]: [address]Phone: [phone] Fax: [phone]    Social Determinants of Health (SDOH) Interventions    Readmission Risk Interventions Readmission Risk Prevention Plan 11/02/2018  Transportation Screening Complete  PCP or Specialist Appt within 3-5 Days Complete  [place] or [place]  Social Work Consult for [place]/Counseling Complete  Palliative Care Screening Not Applicable  Medication Review (Press photographer Complete  Some recent data might be hidden

## 2018-11-03 LAB — BASIC METABOLIC PANEL
Anion gap: 9 (ref 5–15)
BUN: 20 mg/dL (ref 6–20)
CO2: 23 mmol/L (ref 22–32)
Calcium: 8.1 mg/dL — ABNORMAL LOW (ref 8.9–10.3)
Chloride: 104 mmol/L (ref 98–111)
Creatinine, Ser: 0.75 mg/dL (ref 0.44–1.00)
GFR calc Af Amer: >60 mL/min (ref >60)
GFR calc non Af Amer: >60 mL/min (ref >60)
Glucose, Bld: 116 mg/dL — ABNORMAL HIGH (ref 70–99)
Potassium: 4.1 mmol/L (ref 3.5–5.1)
Sodium: 136 mmol/L (ref 135–145)

## 2018-11-03 LAB — GLUCOSE, CAPILLARY: Glucose-Capillary: 130 mg/dL — ABNORMAL HIGH (ref 70–99)

## 2018-11-03 MED ORDER — ERYTHROMYCIN BASE 500 MG PO TBEC: 500.0000 mg | DELAYED_RELEASE_TABLET | Freq: Three times a day (TID) | ORAL | 0 refills | Status: AC

## 2018-11-03 MED ORDER — LISINOPRIL 20 MG PO TABS: 20.0000 mg | ORAL_TABLET | Freq: Every day | ORAL | 1 refills | Status: AC

## 2018-11-03 NOTE — Discharge Instructions (Signed)
Keep log of your BP and hold BP meds if systolic BP <115 Keep log of your sugars at home

## 2018-11-03 NOTE — Discharge Summary (Signed)
[facility] [place] [facility]   PATIENT NAME: Daisy [name]    MR#:  [id number]  DATE OF BIRTH:  Apr 20, 1966  DATE OF ADMISSION:  10/29/2018 ADMITTING PHYSICIAN: Oralia Manis, MD  DATE OF DISCHARGE: 11/03/2018  PRIMARY CARE PHYSICIAN: Malva Limes, MD    ADMISSION DIAGNOSIS:  Intractable vomiting with nausea [R11.2]  DISCHARGE DIAGNOSIS:  Gastroparesis--resolved Acute Renal failure due to GI loses  SECONDARY DIAGNOSIS:   Past Medical History:  Diagnosis Date  . Diabetes mellitus without complication (HCC)   . Gastritis   . Gastroparesis   . Hernia of abdominal wall   . Hypertension   . Migraine     HOSPITAL COURSE:   #Acute kidney injury From decreased p.o. intake from nausea and vomiting Provided IV fluids and avoid nephrotoxins Creat at baseline  #Intractable nausea and vomiting from gastroparesis Clinically improving--now tolerating po diet Continue with the antiemetics  Advance diet as tolerated Patient is on Reglan and Phenergan at home, currently on  Reglan po  Phenergan  Prn and Zofran po   added  erythromycin to the regimen -patient is clinically feeling better and would continue the same for total 7 days  #Hyponatremia Gentle hydration with IV fluids  Sodium 136  #Hypotension Clinically better Hold clonidine and norvasc Decreased lisinopril to 20 mg daily -d/ced lasix Titrate medications as needed--defer to Dr Sherrie Mustache as out pt  #diabetes mellitus Insulin sliding scale -resume home mes=ds  #Hyperlipidemia continue statin  #Bipolar disorder-continue home medication trazodone  #Chronic pain-continue home medication oxycodone and outpatient follow-up with pain management Dr. Metta Clines.  Overall improving D/c home later Pt agreeable  CONSULTS OBTAINED:    DRUG ALLERGIES:   Allergies  Allergen Reactions  . Sulfasalazine Hives  . Acetaminophen-Codeine Hives  . Atorvastatin Other (See Comments)     Muscle pain  . Codeine Hives  . Ibuprofen Nausea And Vomiting  . Propoxyphene Other (See Comments)    Reaction:  GI upset   . Sulfa Antibiotics Hives  . Duloxetine Rash and Other (See Comments)    Yellowing of the skin    DISCHARGE MEDICATIONS:   Allergies as of 11/03/2018      Reactions   Sulfasalazine Hives   Acetaminophen-codeine Hives   Atorvastatin Other (See Comments)   Muscle pain   Codeine Hives   Ibuprofen Nausea And Vomiting   Propoxyphene Other (See Comments)   Reaction:  GI upset    Sulfa Antibiotics Hives   Duloxetine Rash, Other (See Comments)   Yellowing of the skin      Medication List    STOP taking these medications   amLODipine 5 MG tablet Commonly known as:  NORVASC   cloNIDine 0.1 mg/24hr patch Commonly known as:  CATAPRES - Dosed in mg/24 hr   furosemide 20 MG tablet Commonly known as:  LASIX     TAKE these medications   albuterol 108 (90 Base) MCG/ACT inhaler Commonly known as:  VENTOLIN HFA Inhale 2 puffs into the lungs every 6 (six) hours as needed for wheezing or shortness of breath.   alprazolam 2 MG tablet Commonly known as:  XANAX Take 2 mg by mouth 3 (three) times daily.   cetirizine 10 MG tablet Commonly known as:  ZYRTEC Take 1 tablet (10 mg total) by mouth daily.   erythromycin 500 MG EC tablet Commonly known as:  ERY-TAB Take 1 tablet (500 mg total) by mouth 4 (four) times daily -  with meals and at bedtime for  6 days.   famotidine 20 MG tablet Commonly known as:  PEPCID Take 1 tablet (20 mg total) by mouth daily.   FLUoxetine 20 MG capsule Commonly known as:  PROZAC Take 20 mg by mouth daily.   fluticasone 50 MCG/ACT nasal spray Commonly known as:  FLONASE Use 2 sprays into each nostril every day What changed:  See the new instructions.   lamoTRIgine 100 MG tablet Commonly known as:  LAMICTAL Take 100 mg by mouth daily.   lisinopril 20 MG tablet Commonly known as:  ZESTRIL Take 1 tablet (20 mg total) by  mouth daily. What changed:  See the new instructions.   metFORMIN 500 MG tablet Commonly known as:  GLUCOPHAGE Take 1 tablet by mouth twice daily with meals   metoCLOPramide 10 MG tablet Commonly known as:  REGLAN Take 1 tablet (10 mg total) by mouth 3 (three) times daily with meals.   metoprolol succinate 25 MG 24 hr tablet Commonly known as:  TOPROL-XL Take 2 tablets by mouth every day   montelukast 10 MG tablet Commonly known as:  SINGULAIR Take 1 tablet (10 mg total) by mouth at bedtime.   OVER THE COUNTER MEDICATION Take 1 each by mouth daily. Nutraburst supplement- contains Vitamins B, C, and D   Oxycodone HCl 10 MG Tabs Take 1 tablet (10 mg total) by mouth every 6 (six) hours as needed (pain). What changed:    when to take this  additional instructions   pantoprazole 40 MG tablet Commonly known as:  PROTONIX Take 1 tablet by mouth twice daily   potassium chloride SA 20 MEQ tablet Commonly known as:  K-DUR Take 1 tablet by mouth every day with furosemide What changed:  See the new instructions.   promethazine 12.5 MG tablet Commonly known as:  PHENERGAN Take 1 tablet by mouth every 6 hours as needed for refractory nausea/vomiting What changed:  See the new instructions.   protein supplement shake Liqd Commonly known as:  PREMIER PROTEIN Take 325 mLs (11 oz total) by mouth 3 (three) times daily between meals.   rosuvastatin 10 MG tablet Commonly known as:  CRESTOR TAKE 1 TABLET BY MOUTH EVERY DAY   tiZANidine 4 MG tablet Commonly known as:  ZANAFLEX Take 4 mg by mouth 3 (three) times daily.   traZODone 100 MG tablet Commonly known as:  DESYREL Take 200 mg by mouth at bedtime.   zolpidem 10 MG tablet Commonly known as:  AMBIEN Take 10 mg by mouth at bedtime.       If you experience worsening of your admission symptoms, develop shortness of breath, life threatening emergency, suicidal or homicidal thoughts you must seek medical attention  immediately by calling 911 or calling your MD immediately  if symptoms less severe.  You Must read complete instructions/literature along with all the possible adverse reactions/side effects for all the Medicines you take and that have been prescribed to you. Take any new Medicines after you have completely understood and accept all the possible adverse reactions/side effects.   Please note  You were cared for by a hospitalist during your hospital stay. If you have any questions about your discharge medications or the care you received while you were in the hospital after you are discharged, you can call the unit and asked to speak with the hospitalist on call if the hospitalist that took care of you is not available. Once you are discharged, your primary care physician will handle any further medical issues. Please note  that NO REFILLS for any discharge medications will be authorized once you are discharged, as it is imperative that you return to your primary care physician (or establish a relationship with a primary care physician if you do not have one) for your aftercare needs so that they can reassess your need for medications and monitor your lab values. Today   SUBJECTIVE   Ate last nite. No vomiting  VITAL SIGNS:  Blood pressure 100/63, pulse (!) 102, temperature 98.5 F (36.9 C), temperature source Oral, resp. rate 18, height  (1.651 m), weight 128.9 kg, SpO2 98 %.  I/O:    Intake/Output Summary (Last 24 hours) at 11/03/2018 0744 Last data filed at 11/02/2018 1900 Gross per 24 hour  Intake 3011.64 ml  Output -  Net 3011.64 ml    PHYSICAL EXAMINATION:  GENERAL:  53 y.o.-year-old patient lying in the bed with no acute distress. obese EYES: Pupils equal, round, reactive to light and accommodation. No scleral icterus. Extraocular muscles intact.  HEENT: Head atraumatic, normocephalic. Oropharynx and nasopharynx clear.  NECK:  Supple, no jugular venous distention. No thyroid  enlargement, no tenderness.  LUNGS: Normal breath sounds bilaterally, no wheezing, rales,rhonchi or crepitation. No use of accessory muscles of respiration.  CARDIOVASCULAR: S1, S2 normal. No murmurs, rubs, or gallops.  ABDOMEN: Soft, non-tender, non-distended. Bowel sounds present. No organomegaly or mass.  EXTREMITIES: No pedal edema, cyanosis, or clubbing.  NEUROLOGIC: Cranial nerves II through XII are intact. Muscle strength 5/5 in all extremities. Sensation intact. Gait not checked.  PSYCHIATRIC: The patient is alert and oriented x 3.  SKIN: No obvious rash, lesion, or ulcer.   DATA REVIEW:   CBC  Recent Labs  Lab 10/31/18 0801  WBC 9.7  HGB 13.4  HCT 41.4  PLT 347    Chemistries  Recent Labs  Lab 10/29/18 1849 10/31/18 0402  11/03/18 0353  NA 139  --    < > 136  K 3.3*  --    < > 4.1  CL 102  --    < > 104  CO2 25  --    < > 23  GLUCOSE 156*  --    < > 116*  BUN 7  --    < > 20  CREATININE 0.78  --    < > 0.75  CALCIUM 9.3  --    < > 8.1*  MG  --  2.2  --   --   AST 18  --   --   --   ALT 14  --   --   --   ALKPHOS 59  --   --   --   BILITOT 0.6  --   --   --    < > = values in this interval not displayed.    Microbiology Results   Recent Results (from the past 240 hour(s))  SARS Coronavirus 2 (CEPHEID - Performed in [facility] hospital lab), Hosp Order     Status: None   Collection Time: 10/29/18 10:44 PM  Result Value Ref Range Status   SARS Coronavirus 2 NEGATIVE NEGATIVE Final    Comment: (NOTE) If result is NEGATIVE SARS-CoV-2 target nucleic acids are NOT DETECTED. The SARS-CoV-2 RNA is generally detectable in upper and lower  respiratory specimens during the acute phase of infection. The lowest  concentration of SARS-CoV-2 viral copies this assay can detect is 250  copies / mL. A negative result does not preclude SARS-CoV-2 infection  and should  not be used as the sole basis for treatment or other  patient management decisions.  A negative result  may occur with  improper specimen collection / handling, submission of specimen other  than nasopharyngeal swab, presence of viral mutation(s) within the  areas targeted by this assay, and inadequate number of viral copies  (<250 copies / mL). A negative result must be combined with clinical  observations, patient history, and epidemiological information. If result is POSITIVE SARS-CoV-2 target nucleic acids are DETECTED. The SARS-CoV-2 RNA is generally detectable in upper and lower  respiratory specimens dur ing the acute phase of infection.  Positive  results are indicative of active infection with SARS-CoV-2.  Clinical  correlation with patient history and other diagnostic information is  necessary to determine patient infection status.  Positive results do  not rule out bacterial infection or co-infection with other viruses. If result is PRESUMPTIVE POSTIVE SARS-CoV-2 nucleic acids MAY BE PRESENT.   A presumptive positive result was obtained on the submitted specimen  and confirmed on repeat testing.  While 2019 novel coronavirus  (SARS-CoV-2) nucleic acids may be present in the submitted sample  additional confirmatory testing may be necessary for epidemiological  and / or clinical management purposes  to differentiate between  SARS-CoV-2 and other Sarbecovirus currently known to infect humans.  If clinically indicated additional testing with an alternate test  methodology [phone]([id number]) is advised. The SARS-CoV-2 RNA is generally  detectable in upper and lower respiratory sp ecimens during the acute  phase of infection. The expected result is Negative. Fact Sheet for Patients:  BoilerBrush.com.cyhttps://www.fda.gov/media/136312/download Fact Sheet for Healthcare Providers: https://pope.com/https://www.fda.gov/media/136313/download This test is not yet approved or cleared by the [place] FDA and has been authorized for detection and/or diagnosis of SARS-CoV-2 by FDA under an Emergency Use Authorization (EUA).   This EUA will remain in effect (meaning this test can be used) for the duration of the COVID-19 declaration under Section 564(b)(1) of the Act, 21 U.S.C. section 360bbb-3(b)(1), unless the authorization is terminated or revoked sooner. Performed at [facility], [address] [place], [place] [address]     RADIOLOGY:  No results found.   CODE STATUS:     Code Status Orders  (From admission, onward)         Start     Ordered   10/30/18 0242  Full code  Continuous     10/30/18 0242        Code Status History    Date Active Date Inactive Code Status Order ID Comments User Context   04/16/2018 2108 04/17/2018 2106 Full Code [id number]  Milagros LollSudini, Srikar, MD ED   01/14/2018 1207 01/15/2018 1410 Full Code [id number]  Barbaraann RondoSridharan, Prasanna, MD ED   11/29/2017 0111 11/30/2017 1813 Full Code [id number]  Oralia ManisWillis, David, MD Inpatient   10/02/2017 1226 10/03/2017 1959 Full Code [id number]  Shaune Pollackhen, Qing, MD Inpatient   03/16/2017 0454 03/18/2017 1637 Full Code [id number]  Ihor AustinPyreddy, Pavan, MD Inpatient   01/03/2017 0824 01/05/2017 1557 Full Code [id number]  Ihor AustinPyreddy, Pavan, MD Inpatient   03/23/2016 0629 03/26/2016 1651 Full Code [id number]  Arnaldo Nataliamond, Michael S, MD Inpatient   09/28/2015 0454 09/29/2015 1720 Full Code [id number]  Ihor AustinPyreddy, Pavan, MD ED   05/05/2015 2036 05/09/2015 2006 Full Code [id number]  Hower, Cletis Athensavid K, MD ED   01/07/2015 1911 01/09/2015 1855 Full Code [id number]  Milagros LollSudini, Srikar, MD ED      TOTAL TIME TAKING CARE OF THIS PATIENT: *40* minutes.    Enedina FinnerSona Anthonie Lotito M.D on  11/03/2018 at 7:44 AM  Between 7am to 6pm - Pager - [phone] After 6pm go to [email or web address] - Social research officer, government  [facility]  Office  [phone]  CC: Primary care physician; Malva Limes, MD

## 2018-11-07 DIAGNOSIS — M179 Osteoarthritis of knee, unspecified: Secondary | ICD-10-CM | POA: Diagnosis not present

## 2018-11-07 DIAGNOSIS — E1140 Type 2 diabetes mellitus with diabetic neuropathy, unspecified: Secondary | ICD-10-CM | POA: Diagnosis not present

## 2018-11-07 DIAGNOSIS — M5481 Occipital neuralgia: Secondary | ICD-10-CM | POA: Diagnosis not present

## 2018-11-07 DIAGNOSIS — M545 Low back pain: Secondary | ICD-10-CM | POA: Diagnosis not present

## 2018-11-07 DIAGNOSIS — M47897 Other spondylosis, lumbosacral region: Secondary | ICD-10-CM | POA: Diagnosis not present

## 2018-11-07 DIAGNOSIS — M48062 Spinal stenosis, lumbar region with neurogenic claudication: Secondary | ICD-10-CM | POA: Diagnosis not present

## 2018-11-07 DIAGNOSIS — G894 Chronic pain syndrome: Secondary | ICD-10-CM | POA: Diagnosis not present

## 2018-11-07 DIAGNOSIS — M5030 Other cervical disc degeneration, unspecified cervical region: Secondary | ICD-10-CM | POA: Diagnosis not present

## 2018-11-07 DIAGNOSIS — M5136 Other intervertebral disc degeneration, lumbar region: Secondary | ICD-10-CM | POA: Diagnosis not present

## 2018-11-08 LAB — GLUCOSE, CAPILLARY: Glucose-Capillary: 150 mg/dL — ABNORMAL HIGH (ref 70–99)

## 2018-11-09 ENCOUNTER — Other Ambulatory Visit: Payer: Self-pay | Admitting: Family Medicine

## 2018-11-10 ENCOUNTER — Inpatient Hospital Stay: Payer: Medicare Other | Admitting: Family Medicine

## 2018-11-22 ENCOUNTER — Other Ambulatory Visit: Payer: Self-pay | Admitting: Family Medicine

## 2018-12-04 DIAGNOSIS — M545 Low back pain: Secondary | ICD-10-CM | POA: Diagnosis not present

## 2018-12-04 DIAGNOSIS — M5136 Other intervertebral disc degeneration, lumbar region: Secondary | ICD-10-CM | POA: Diagnosis not present

## 2018-12-04 DIAGNOSIS — M179 Osteoarthritis of knee, unspecified: Secondary | ICD-10-CM | POA: Diagnosis not present

## 2018-12-04 DIAGNOSIS — M5030 Other cervical disc degeneration, unspecified cervical region: Secondary | ICD-10-CM | POA: Diagnosis not present

## 2018-12-05 ENCOUNTER — Telehealth: Payer: Self-pay | Admitting: Family Medicine

## 2018-12-05 DIAGNOSIS — M545 Low back pain: Secondary | ICD-10-CM | POA: Diagnosis not present

## 2018-12-05 DIAGNOSIS — M179 Osteoarthritis of knee, unspecified: Secondary | ICD-10-CM | POA: Diagnosis not present

## 2018-12-05 DIAGNOSIS — M5030 Other cervical disc degeneration, unspecified cervical region: Secondary | ICD-10-CM | POA: Diagnosis not present

## 2018-12-05 DIAGNOSIS — M5136 Other intervertebral disc degeneration, lumbar region: Secondary | ICD-10-CM | POA: Diagnosis not present

## 2018-12-05 NOTE — Chronic Care Management (AMB) (Signed)
°  Chronic Care Management   Outreach Note  12/05/2018 Name: Daisy [name] MRN: [id number] DOB: 1965-10-17  Referred by: Birdie Sons, MD Reason for referral : Chronic Care Management (Initial CCM outreach was unsuccessful.)   An unsuccessful telephone outreach was attempted today. The patient was referred to the case management team by for assistance with chronic care management and care coordination.   Follow Up Plan: A HIPPA compliant phone message was left for the patient providing contact information and requesting a return call.  The care management team will reach out to the patient again over the next 7 days.  If patient returns call to provider office, please advise to call [place] at [place]  [email or web address]   ??[phone]

## 2018-12-11 ENCOUNTER — Ambulatory Visit: Payer: Medicare Other | Admitting: Family Medicine

## 2018-12-12 NOTE — Chronic Care Management (AMB) (Signed)
°  Chronic Care Management   Outreach Note  12/12/2018 Name: Daisy [name] MRN: [id number] DOB: [birth year]  Referred by: Birdie Sons, MD Reason for referral : Chronic Care Management (Initial CCM outreach was unsuccessful.) and Chronic Care Management (Second CCM outreach was unsuccessful. )   Third unsuccessful telephone outreach was attempted today. The patient was referred to the case management team for assistance with chronic care management and care coordination. The patient's primary care provider has been notified of our unsuccessful attempts to make or maintain contact with the patient. The care management team is pleased to engage with this patient at any time in the future should he/she be interested in assistance from the care management team.   Follow Up Plan: The care management team is available to follow up with the patient after provider conversation with the patient regarding recommendation for care management engagement and subsequent re-referral to the care management team.   [place]  [email or web address]   ??[phone]

## 2018-12-15 ENCOUNTER — Other Ambulatory Visit: Payer: Self-pay | Admitting: Family Medicine

## 2018-12-15 DIAGNOSIS — R059 Cough, unspecified: Secondary | ICD-10-CM

## 2018-12-15 DIAGNOSIS — R05 Cough: Secondary | ICD-10-CM

## 2019-01-01 ENCOUNTER — Ambulatory Visit (INDEPENDENT_AMBULATORY_CARE_PROVIDER_SITE_OTHER): Payer: Medicare Other | Admitting: Family Medicine

## 2019-01-01 ENCOUNTER — Other Ambulatory Visit: Payer: Self-pay

## 2019-01-01 ENCOUNTER — Encounter: Payer: Self-pay | Admitting: Family Medicine

## 2019-01-01 VITALS — BP 123/77 | HR 76 | Temp 98.7°F | Wt 290.0 lb

## 2019-01-01 DIAGNOSIS — E785 Hyperlipidemia, unspecified: Secondary | ICD-10-CM | POA: Diagnosis not present

## 2019-01-01 DIAGNOSIS — I10 Essential (primary) hypertension: Secondary | ICD-10-CM

## 2019-01-01 DIAGNOSIS — R05 Cough: Secondary | ICD-10-CM | POA: Diagnosis not present

## 2019-01-01 DIAGNOSIS — E1169 Type 2 diabetes mellitus with other specified complication: Secondary | ICD-10-CM

## 2019-01-01 DIAGNOSIS — R059 Cough, unspecified: Secondary | ICD-10-CM

## 2019-01-01 DIAGNOSIS — J309 Allergic rhinitis, unspecified: Secondary | ICD-10-CM

## 2019-01-01 LAB — POCT GLYCOSYLATED HEMOGLOBIN (HGB A1C): Hemoglobin A1C: 6.5 % — AB (ref 4.0–5.6)

## 2019-01-01 MED ORDER — CHLORPHENIRAMINE-DM 1-7.5 MG/5ML PO SYRP: 118.0000 mL | ORAL_SOLUTION | Freq: Four times a day (QID) | ORAL | 0 refills | Status: DC | PRN

## 2019-01-01 NOTE — Progress Notes (Signed)
Patient: Daisy [name] Female    DOB: [birth year]   53 y.o.   MRN: [id number] Visit Date: 01/01/2019  Today's Provider: Lelon Huh, MD   Chief Complaint  Patient presents with  . Cough   Subjective:     Cough This is a new problem. Episode onset: Started about two weeks ago. The problem occurs constantly. The cough is productive of sputum. Associated symptoms include heartburn, nasal congestion, postnasal drip and rhinorrhea (Only when she sneeze). Pertinent negatives include no chest pain, ear congestion, ear pain, eye redness, fever, headaches, hemoptysis, sore throat, shortness of breath or wheezing. Her past medical history is significant for environmental allergies.  Rarely coughs up thin clear mucous, with persistent nasal drainage c/w history of allergies. She is taking OTC Zyrtec and fluticasone nasal spray.    Diabetes Mellitus Type II, Follow-up:   Lab Results  Component Value Date   HGBA1C 5.9 (H) 04/16/2018   HGBA1C 5.9 (H) 10/02/2017   HGBA1C 6.1 08/15/2017   Last seen for diabetes 7 months ago.  Management since then includes no changes. She reports excellent compliance with treatment. She is not having side effects.  Current symptoms include nausea and vomitting and have been stable. Home blood sugar records: never over 160  Episodes of hypoglycemia? no   Current Insulin Regimen: None Most Recent Eye Exam: Pt is due for an eye exam Weight trend: stable Prior visit with dietician: no Current diet: in general, a "healthy" diet   Current exercise: none  ------------------------------------------------------------------------   Hypertension, follow-up:  BP Readings from Last 3 Encounters:  01/01/19 123/77  11/03/18 108/68  10/16/18 112/77    She was last seen for hypertension 7 months ago.   Management since that visit includes no changes She reports excellent compliance with treatment. She is not having side effects.  She is not  exercising. She is adherent to low salt diet.   Outside blood pressures are not being checked consistently. She is experiencing none.  Patient denies chest pain, dyspnea, irregular heart beat and palpitations.   Cardiovascular risk factors include diabetes mellitus, dyslipidemia, hypertension, obesity (BMI >= 30 kg/m2) and sedentary lifestyle.  Use of agents associated with hypertension: none.   ------------------------------------------------------------------------    Lipid/Cholesterol, Follow-up:    Management since that visit includes no changes.  Last Lipid Panel:    Component Value Date/Time   CHOL 188 09/14/2016 1227   CHOL 236 (H) 09/29/2011 0404   TRIG 106 09/14/2016 1227   TRIG 107 09/29/2011 0404   HDL 59 09/14/2016 1227   HDL 53 09/29/2011 0404   CHOLHDL 3.2 09/14/2016 1227   VLDL 21 09/29/2011 0404   LDLCALC 108 (H) 09/14/2016 1227   LDLCALC 162 (H) 09/29/2011 0404    She reports excellent compliance with treatment. She is not having side effects.   Wt Readings from Last 3 Encounters:  01/01/19 290 lb (131.5 kg)  10/30/18 284 lb 1.6 oz (128.9 kg)  10/16/18 283 lb (128.4 kg)    ------------------------------------------------------------------------    Allergies  Allergen Reactions  . Sulfasalazine Hives  . Acetaminophen-Codeine Hives  . Atorvastatin Other (See Comments)    Muscle pain  . Codeine Hives  . Ibuprofen Nausea And Vomiting  . Propoxyphene Other (See Comments)    Reaction:  GI upset   . Sulfa Antibiotics Hives  . Duloxetine Rash and Other (See Comments)    Yellowing of the skin     Current Outpatient Medications:  .  albuterol (VENTOLIN HFA) 108 (90 Base) MCG/ACT inhaler, Inhale 2 puffs by mouth every 6 hours as needed for wheezing or shortness of breath, Disp: 8.5 g, Rfl: 11 .  alprazolam (XANAX) 2 MG tablet, Take 2 mg by mouth 3 (three) times daily. , Disp: , Rfl:  .  cetirizine (ZYRTEC) 10 MG tablet, Take 1 tablet (10 mg total)  by mouth daily., Disp: 30 tablet, Rfl: 11 .  famotidine (PEPCID) 20 MG tablet, Take 1 tablet (20 mg total) by mouth daily., Disp: 90 tablet, Rfl: 3 .  FLUoxetine (PROZAC) 20 MG capsule, Take 20 mg by mouth daily. , Disp: , Rfl:  .  fluticasone (FLONASE) 50 MCG/ACT nasal spray, Use 2 sprays into each nostril every day, Disp: 42 g, Rfl: 11 .  lamoTRIgine (LAMICTAL) 100 MG tablet, Take 100 mg by mouth daily. , Disp: , Rfl:  .  lisinopril (ZESTRIL) 20 MG tablet, Take 1 tablet (20 mg total) by mouth daily., Disp: 30 tablet, Rfl: 1 .  metFORMIN (GLUCOPHAGE) 500 MG tablet, Take 1 tablet by mouth twice daily with meals (Patient taking differently: Take 500 mg by mouth 2 (two) times daily with a meal. ), Disp: 60 tablet, Rfl: 11 .  metoCLOPramide (REGLAN) 10 MG tablet, Take 1 tablet by mouth 3 times a day with meals, Disp: 90 tablet, Rfl: 11 .  metoprolol succinate (TOPROL-XL) 25 MG 24 hr tablet, Take 2 tablets by mouth every day (Patient taking differently: Take 50 mg by mouth daily. ), Disp: 180 tablet, Rfl: 3 .  montelukast (SINGULAIR) 10 MG tablet, Take 1 tablet (10 mg total) by mouth at bedtime., Disp: 90 tablet, Rfl: 1 .  OVER THE COUNTER MEDICATION, Take 1 each by mouth daily. Nutraburst supplement- contains Vitamins B, C, and D, Disp: , Rfl:  .  Oxycodone HCl 10 MG TABS, Take 1 tablet (10 mg total) by mouth every 6 (six) hours as needed (pain). (Patient taking differently: Take 10 mg by mouth as needed (pain). Can take 4-7 tablets per day), Disp: 40 tablet, Rfl: 0 .  pantoprazole (PROTONIX) 40 MG tablet, Take 1 tablet by mouth twice daily (Patient taking differently: Take 40 mg by mouth 2 (two) times daily. ), Disp: 60 tablet, Rfl: 11 .  potassium chloride SA (K-DUR,KLOR-CON) 20 MEQ tablet, Take 1 tablet by mouth every day with furosemide (Patient taking differently: Take 20 mEq by mouth daily. With furosemide), Disp: 30 tablet, Rfl: 11 .  promethazine (PHENERGAN) 12.5 MG tablet, Take 1 tablet (12.5 mg  total) by mouth every 6 (six) hours as needed for refractory nausea / vomiting., Disp: 30 tablet, Rfl: 11 .  protein supplement shake (PREMIER PROTEIN) LIQD, Take 325 mLs (11 oz total) by mouth 3 (three) times daily between meals., Disp: 30 Can, Rfl: 0 .  rosuvastatin (CRESTOR) 10 MG tablet, Take 1 tablet (10 mg total) by mouth daily., Disp: 90 tablet, Rfl: 4 .  tiZANidine (ZANAFLEX) 4 MG tablet, Take 4 mg by mouth 3 (three) times daily. , Disp: , Rfl:  .  traZODone (DESYREL) 100 MG tablet, Take 200 mg by mouth at bedtime. , Disp: , Rfl:  .  zolpidem (AMBIEN) 10 MG tablet, Take 10 mg by mouth at bedtime. , Disp: , Rfl:   Review of Systems  Constitutional: Negative.  Negative for fever.  HENT: Positive for congestion, postnasal drip, rhinorrhea (Only when she sneeze), sinus pressure, sinus pain and sneezing. Negative for ear discharge, ear pain, sore throat, tinnitus, trouble swallowing and voice  change.   Eyes: Positive for discharge and itching. Negative for pain, redness and visual disturbance.  Respiratory: Positive for cough (Started a few weeks ago.). Negative for apnea, hemoptysis, choking, shortness of breath, wheezing and stridor.   Cardiovascular: Negative for chest pain.  Gastrointestinal: Positive for heartburn and vomiting. Negative for abdominal distention, abdominal pain, anal bleeding, blood in stool, constipation, diarrhea, nausea and rectal pain.  Musculoskeletal: Negative.   Allergic/Immunologic: Positive for environmental allergies.  Neurological: Negative for dizziness, light-headedness and headaches.    Social History   Tobacco Use  . Smoking status: Never Smoker  . Smokeless tobacco: Former NeurosurgeonUser    Types: Snuff  . Tobacco comment: quit > 30 years ago  Substance Use Topics  . Alcohol use: Yes    Alcohol/week: 1.0 standard drinks    Types: 1 Standard drinks or equivalent per week    Comment: only on special occasions- wine      Objective:   BP 123/77 (BP  Location: Right Arm, Patient Position: Sitting, Cuff Size: Large)   Pulse 76   Temp 98.7 F (37.1 C) (Oral)   Wt 290 lb (131.5 kg)   BMI 48.26 kg/m  Vitals:   01/01/19 0940  BP: 123/77  Pulse: 76  Temp: 98.7 F (37.1 C)  TempSrc: Oral  Weight: 290 lb (131.5 kg)     Physical Exam  General Appearance:    Alert, cooperative, no distress  HENT:   bilateral TM normal without fluid or infection, neck without nodes, throat normal without erythema or exudate, sinuses nontender, clear post nasal drip noted and nasal mucosa congested  Eyes:    PERRL, conjunctiva/corneas clear, EOM's intact       Lungs:     Clear to auscultation bilaterally, respirations unlabored  Heart:    Regular rate and rhythm  Neurologic:   Awake, alert, oriented x 3. No apparent focal neurological           defect.        Results for orders placed or performed in visit on 01/01/19  POCT glycosylated hemoglobin (Hb A1C)  Result Value Ref Range   Hemoglobin A1C 6.5 (A) 4.0 - 5.6 %       Assessment & Plan    1. Essential hypertension Well controlled.  Continue current medications.    2. Type 2 diabetes mellitus with other specified complication, without long-term current use of insulin (HCC) Well controlled.  Continue current medications.   - Comprehensive metabolic panel - Lipid panel - TSH - CBC  3. Hyperlipidemia, unspecified hyperlipidemia type She is tolerating rosuvastatin well with no adverse effects.    4. Allergic rhinitis, unspecified seasonality, unspecified trigger Currently taking OTC Zyrtec and prescription montelukas - Chlorpheniramine-DM 1-7.5 MG/5ML SYRP; Take 118 mLs by mouth every 6 (six) hours as needed.  Dispense: 118 mL; Refill: 0  5. Cough Secondary to allergies.  - Chlorpheniramine-DM 1-7.5 MG/5ML SYRP; Take 118 mLs by mouth every 6 (six) hours as needed.  Dispense: 118 mL; Refill: 0  Follow up 4 months  The entirety of the information documented in the History of Present  Illness, Review of Systems and Physical Exam were personally obtained by me. Portions of this information were initially documented by Kavin LeechLaura Walsh, CMA and reviewed by me for thoroughness and accuracy.      Mila Merryonald Fisher, MD  [facility]

## 2019-01-01 NOTE — Patient Instructions (Addendum)
.   Please review the attached list of medications and notify my office if there are any errors.   . Please bring all of your medications to every appointment so we can make sure that our medication list is the same as yours.    We will have flu vaccines available after Labor Day. Please go to your pharmacy or call the office in early September to schedule you flu shot.  . Please go to the lab draw station in [address] on the second floor of [facility]  when you are fasting for 8 hours. Normal hours are 8:00am to 12:30pm and 1:30pm to 4:00pm Monday through Friday, but is closed on Monday July 20th, 2020   Your A1c today was 6.5%

## 2019-01-02 ENCOUNTER — Other Ambulatory Visit: Payer: Self-pay | Admitting: Family Medicine

## 2019-01-02 DIAGNOSIS — M179 Osteoarthritis of knee, unspecified: Secondary | ICD-10-CM | POA: Diagnosis not present

## 2019-01-02 DIAGNOSIS — M5136 Other intervertebral disc degeneration, lumbar region: Secondary | ICD-10-CM | POA: Diagnosis not present

## 2019-01-02 DIAGNOSIS — R059 Cough, unspecified: Secondary | ICD-10-CM

## 2019-01-02 DIAGNOSIS — M5030 Other cervical disc degeneration, unspecified cervical region: Secondary | ICD-10-CM | POA: Diagnosis not present

## 2019-01-02 DIAGNOSIS — J309 Allergic rhinitis, unspecified: Secondary | ICD-10-CM

## 2019-01-02 DIAGNOSIS — R05 Cough: Secondary | ICD-10-CM

## 2019-01-02 DIAGNOSIS — M545 Low back pain: Secondary | ICD-10-CM | POA: Diagnosis not present

## 2019-01-02 MED ORDER — CHLORPHENIRAMINE-DM 1-7.5 MG/5ML PO SYRP: 5.0000 mL | ORAL_SOLUTION | Freq: Four times a day (QID) | ORAL | 0 refills | Status: AC | PRN

## 2019-01-06 ENCOUNTER — Other Ambulatory Visit: Payer: Self-pay | Admitting: Family Medicine

## 2019-01-16 ENCOUNTER — Telehealth: Payer: Self-pay | Admitting: Family Medicine

## 2019-01-16 DIAGNOSIS — R4020 Unspecified coma: Secondary | ICD-10-CM | POA: Diagnosis not present

## 2019-01-16 DIAGNOSIS — R0689 Other abnormalities of breathing: Secondary | ICD-10-CM | POA: Diagnosis not present

## 2019-01-16 DIAGNOSIS — I499 Cardiac arrhythmia, unspecified: Secondary | ICD-10-CM | POA: Diagnosis not present

## 2019-01-16 DIAGNOSIS — R404 Transient alteration of awareness: Secondary | ICD-10-CM | POA: Diagnosis not present

## 2019-01-16 DIAGNOSIS — R000 Tachycardia, unspecified: Secondary | ICD-10-CM | POA: Diagnosis not present

## 2019-01-17 ENCOUNTER — Telehealth: Payer: Self-pay | Admitting: Family Medicine

## 2019-01-17 NOTE — Telephone Encounter (Signed)
Regina advised.  She said that would be okay to wait.    Thanks,   -Mickel Baas

## 2019-01-17 NOTE — Telephone Encounter (Signed)
Yes, but I need to leave the office at 1 today and will not be here tomorrow.

## 2019-01-17 NOTE — Telephone Encounter (Signed)
[facility] Astrid Divine - [phone]  Deceased will be coming to their funeral service. Needing to know if Dr. Caryn Section will be signing the death certificate?  Please advise asap.  Thanks, [employer]

## 2019-02-09 ENCOUNTER — Other Ambulatory Visit: Payer: Self-pay | Admitting: Family Medicine

## 2019-02-09 DIAGNOSIS — R05 Cough: Secondary | ICD-10-CM

## 2019-02-09 DIAGNOSIS — R059 Cough, unspecified: Secondary | ICD-10-CM

## 2019-02-09 DIAGNOSIS — J301 Allergic rhinitis due to pollen: Secondary | ICD-10-CM

## 2019-02-13 NOTE — Telephone Encounter (Signed)
I don't know anything about the circumstances of this patient's death or even that she died. Needs to be referred to medical examiner.

## 2019-02-13 NOTE — Telephone Encounter (Signed)
Kim advised.   Thanks,   -Mickel Baas

## 2019-02-13 NOTE — Telephone Encounter (Signed)
Maudie Mercury with [facility]    Asking if Dr. Caryn Section will sign the death certificate.  Please call Kim back.  Thanks, [employer]

## 2019-02-13 DEATH — deceased

## 2019-02-14 ENCOUNTER — Telehealth: Payer: Self-pay | Admitting: Family Medicine

## 2019-02-14 NOTE — Telephone Encounter (Signed)
Daisy [name] - a/ [place] [phone]  Checking on the Death Certificate that was left here at [facility].  They have not received the certificate from [place].  Please advise.  Thanks, [employer]

## 2019-02-15 NOTE — Telephone Encounter (Signed)
I filled that out a month ago. You can check with carol to see if it was every picked up.

## 2019-02-15 NOTE — Telephone Encounter (Signed)
Dr Caryn Section, do you have this

## 2019-03-14 ENCOUNTER — Other Ambulatory Visit: Payer: Self-pay | Admitting: Family Medicine

## 2019-03-14 DIAGNOSIS — E119 Type 2 diabetes mellitus without complications: Secondary | ICD-10-CM

## 2019-03-14 DIAGNOSIS — I10 Essential (primary) hypertension: Secondary | ICD-10-CM

## 2019-05-09 ENCOUNTER — Ambulatory Visit: Payer: Medicare Other | Admitting: Family Medicine
